# Patient Record
Sex: Female | Born: 1975 | Race: Black or African American | Hispanic: No | Smoking: Former smoker
Health system: Southern US, Community
[De-identification: ages and names within clinical notes are randomized; demographics above are authoritative.]

## PROBLEM LIST (undated history)

## (undated) DIAGNOSIS — D219 Benign neoplasm of connective and other soft tissue, unspecified: Secondary | ICD-10-CM

## (undated) DIAGNOSIS — H5440 Blindness, one eye, unspecified eye: Secondary | ICD-10-CM

## (undated) DIAGNOSIS — E559 Vitamin D deficiency, unspecified: Secondary | ICD-10-CM

## (undated) DIAGNOSIS — R87629 Unspecified abnormal cytological findings in specimens from vagina: Secondary | ICD-10-CM

## (undated) DIAGNOSIS — H5461 Unqualified visual loss, right eye, normal vision left eye: Secondary | ICD-10-CM

## (undated) DIAGNOSIS — I10 Essential (primary) hypertension: Secondary | ICD-10-CM

## (undated) HISTORY — DX: Essential (primary) hypertension: I10

## (undated) HISTORY — DX: Vitamin D deficiency, unspecified: E55.9

## (undated) HISTORY — PX: EYE SURGERY: SHX253

## (undated) HISTORY — DX: Unspecified abnormal cytological findings in specimens from vagina: R87.629

## (undated) HISTORY — PX: OTHER SURGICAL HISTORY: SHX169

---

## 2000-09-04 ENCOUNTER — Emergency Department (HOSPITAL_COMMUNITY): Admission: EM | Admit: 2000-09-04 | Discharge: 2000-09-04 | Payer: Self-pay | Admitting: Emergency Medicine

## 2001-02-11 ENCOUNTER — Emergency Department (HOSPITAL_COMMUNITY): Admission: EM | Admit: 2001-02-11 | Discharge: 2001-02-11 | Payer: Self-pay | Admitting: Emergency Medicine

## 2001-11-14 ENCOUNTER — Encounter: Payer: Self-pay | Admitting: Emergency Medicine

## 2001-11-14 ENCOUNTER — Emergency Department (HOSPITAL_COMMUNITY): Admission: EM | Admit: 2001-11-14 | Discharge: 2001-11-14 | Payer: Self-pay | Admitting: Emergency Medicine

## 2002-10-30 ENCOUNTER — Inpatient Hospital Stay (HOSPITAL_COMMUNITY): Admission: AD | Admit: 2002-10-30 | Discharge: 2002-10-30 | Payer: Self-pay | Admitting: Family Medicine

## 2002-11-21 ENCOUNTER — Encounter (INDEPENDENT_AMBULATORY_CARE_PROVIDER_SITE_OTHER): Payer: Self-pay | Admitting: *Deleted

## 2002-11-21 ENCOUNTER — Encounter: Admission: RE | Admit: 2002-11-21 | Discharge: 2002-11-21 | Payer: Self-pay | Admitting: Obstetrics and Gynecology

## 2003-02-01 ENCOUNTER — Inpatient Hospital Stay (HOSPITAL_COMMUNITY): Admission: AD | Admit: 2003-02-01 | Discharge: 2003-02-01 | Payer: Self-pay | Admitting: Obstetrics & Gynecology

## 2003-05-11 ENCOUNTER — Emergency Department (HOSPITAL_COMMUNITY): Admission: EM | Admit: 2003-05-11 | Discharge: 2003-05-11 | Payer: Self-pay | Admitting: *Deleted

## 2003-05-24 ENCOUNTER — Encounter: Admission: RE | Admit: 2003-05-24 | Discharge: 2003-05-24 | Payer: Self-pay | Admitting: Family Medicine

## 2003-12-30 ENCOUNTER — Emergency Department (HOSPITAL_COMMUNITY): Admission: EM | Admit: 2003-12-30 | Discharge: 2003-12-31 | Payer: Self-pay | Admitting: Emergency Medicine

## 2004-06-17 ENCOUNTER — Other Ambulatory Visit: Admission: RE | Admit: 2004-06-17 | Discharge: 2004-06-17 | Payer: Self-pay | Admitting: Family Medicine

## 2004-11-11 ENCOUNTER — Emergency Department (HOSPITAL_COMMUNITY): Admission: EM | Admit: 2004-11-11 | Discharge: 2004-11-11 | Payer: Self-pay | Admitting: Emergency Medicine

## 2005-02-17 ENCOUNTER — Emergency Department (HOSPITAL_COMMUNITY): Admission: EM | Admit: 2005-02-17 | Discharge: 2005-02-17 | Payer: Self-pay | Admitting: Emergency Medicine

## 2005-10-31 ENCOUNTER — Emergency Department (HOSPITAL_COMMUNITY): Admission: EM | Admit: 2005-10-31 | Discharge: 2005-10-31 | Payer: Self-pay | Admitting: Emergency Medicine

## 2005-11-25 ENCOUNTER — Ambulatory Visit: Payer: Self-pay | Admitting: Obstetrics & Gynecology

## 2005-12-30 ENCOUNTER — Ambulatory Visit: Payer: Self-pay | Admitting: Obstetrics & Gynecology

## 2006-01-27 ENCOUNTER — Ambulatory Visit: Payer: Self-pay | Admitting: Obstetrics & Gynecology

## 2006-01-27 ENCOUNTER — Other Ambulatory Visit: Admission: RE | Admit: 2006-01-27 | Discharge: 2006-01-27 | Payer: Self-pay | Admitting: Obstetrics and Gynecology

## 2006-01-27 ENCOUNTER — Encounter (INDEPENDENT_AMBULATORY_CARE_PROVIDER_SITE_OTHER): Payer: Self-pay | Admitting: Specialist

## 2006-02-12 ENCOUNTER — Ambulatory Visit: Payer: Self-pay | Admitting: Family Medicine

## 2006-07-09 ENCOUNTER — Ambulatory Visit: Payer: Self-pay | Admitting: Obstetrics and Gynecology

## 2006-07-09 ENCOUNTER — Encounter: Payer: Self-pay | Admitting: Obstetrics and Gynecology

## 2006-11-26 ENCOUNTER — Ambulatory Visit: Payer: Self-pay | Admitting: Obstetrics & Gynecology

## 2006-11-26 ENCOUNTER — Encounter: Payer: Self-pay | Admitting: Obstetrics & Gynecology

## 2007-02-11 ENCOUNTER — Inpatient Hospital Stay (HOSPITAL_COMMUNITY): Admission: AD | Admit: 2007-02-11 | Discharge: 2007-02-11 | Payer: Self-pay | Admitting: Obstetrics and Gynecology

## 2007-02-15 ENCOUNTER — Inpatient Hospital Stay (HOSPITAL_COMMUNITY): Admission: AD | Admit: 2007-02-15 | Discharge: 2007-02-15 | Payer: Self-pay | Admitting: Obstetrics & Gynecology

## 2007-02-19 ENCOUNTER — Inpatient Hospital Stay (HOSPITAL_COMMUNITY): Admission: AD | Admit: 2007-02-19 | Discharge: 2007-02-19 | Payer: Self-pay | Admitting: Obstetrics and Gynecology

## 2007-02-28 ENCOUNTER — Inpatient Hospital Stay (HOSPITAL_COMMUNITY): Admission: AD | Admit: 2007-02-28 | Discharge: 2007-03-01 | Payer: Self-pay | Admitting: Gynecology

## 2007-04-26 ENCOUNTER — Emergency Department (HOSPITAL_COMMUNITY): Admission: EM | Admit: 2007-04-26 | Discharge: 2007-04-26 | Payer: Self-pay | Admitting: Emergency Medicine

## 2007-07-01 ENCOUNTER — Encounter (INDEPENDENT_AMBULATORY_CARE_PROVIDER_SITE_OTHER): Payer: Self-pay | Admitting: Gynecology

## 2007-07-01 ENCOUNTER — Ambulatory Visit: Payer: Self-pay | Admitting: Gynecology

## 2007-09-20 ENCOUNTER — Emergency Department (HOSPITAL_COMMUNITY): Admission: EM | Admit: 2007-09-20 | Discharge: 2007-09-20 | Payer: Self-pay | Admitting: Family Medicine

## 2008-03-16 ENCOUNTER — Encounter: Payer: Self-pay | Admitting: Obstetrics & Gynecology

## 2008-03-16 ENCOUNTER — Ambulatory Visit: Payer: Self-pay | Admitting: Obstetrics & Gynecology

## 2008-03-16 LAB — CONVERTED CEMR LAB

## 2008-11-23 ENCOUNTER — Emergency Department (HOSPITAL_COMMUNITY): Admission: EM | Admit: 2008-11-23 | Discharge: 2008-11-23 | Payer: Self-pay | Admitting: Emergency Medicine

## 2008-11-25 ENCOUNTER — Inpatient Hospital Stay (HOSPITAL_COMMUNITY): Admission: AD | Admit: 2008-11-25 | Discharge: 2008-11-25 | Payer: Self-pay | Admitting: Obstetrics & Gynecology

## 2008-11-28 ENCOUNTER — Ambulatory Visit (HOSPITAL_COMMUNITY): Admission: RE | Admit: 2008-11-28 | Discharge: 2008-11-28 | Payer: Self-pay | Admitting: Obstetrics & Gynecology

## 2008-11-30 ENCOUNTER — Ambulatory Visit: Payer: Self-pay | Admitting: Obstetrics & Gynecology

## 2008-12-07 ENCOUNTER — Ambulatory Visit: Payer: Self-pay | Admitting: Obstetrics and Gynecology

## 2008-12-07 ENCOUNTER — Ambulatory Visit (HOSPITAL_COMMUNITY): Admission: RE | Admit: 2008-12-07 | Discharge: 2008-12-07 | Payer: Self-pay | Admitting: Obstetrics & Gynecology

## 2008-12-14 ENCOUNTER — Inpatient Hospital Stay (HOSPITAL_COMMUNITY): Admission: AD | Admit: 2008-12-14 | Discharge: 2008-12-15 | Payer: Self-pay | Admitting: Obstetrics and Gynecology

## 2009-01-02 ENCOUNTER — Encounter: Payer: Self-pay | Admitting: Obstetrics & Gynecology

## 2009-01-02 ENCOUNTER — Ambulatory Visit: Payer: Self-pay | Admitting: Obstetrics and Gynecology

## 2009-01-02 LAB — CONVERTED CEMR LAB
Antibody Screen: NEGATIVE
Basophils Absolute: 0 10*3/uL (ref 0.0–0.1)
Basophils Relative: 0 % (ref 0–1)
Eosinophils Absolute: 0.1 10*3/uL (ref 0.0–0.7)
HCT: 32.5 % — ABNORMAL LOW (ref 36.0–46.0)
Hemoglobin: 11.7 g/dL — ABNORMAL LOW (ref 12.0–15.0)
Hgb A2 Quant: 2.4 % (ref 2.2–3.2)
Hgb A: 96.2 % — ABNORMAL LOW (ref 96.8–97.8)
Hgb F Quant: 1.4 % (ref 0.0–2.0)
Hgb S Quant: 0 % (ref 0.0–0.0)
Lymphocytes Relative: 30 % (ref 12–46)
MCHC: 36 g/dL (ref 30.0–36.0)
Neutro Abs: 3.7 10*3/uL (ref 1.7–7.7)
RBC: 3.77 M/uL — ABNORMAL LOW (ref 3.87–5.11)

## 2009-01-10 ENCOUNTER — Inpatient Hospital Stay (HOSPITAL_COMMUNITY): Admission: AD | Admit: 2009-01-10 | Discharge: 2009-01-10 | Payer: Self-pay | Admitting: Obstetrics & Gynecology

## 2009-01-14 ENCOUNTER — Ambulatory Visit (HOSPITAL_COMMUNITY): Admission: RE | Admit: 2009-01-14 | Discharge: 2009-01-14 | Payer: Self-pay | Admitting: Family Medicine

## 2009-01-16 ENCOUNTER — Ambulatory Visit: Payer: Self-pay | Admitting: Obstetrics & Gynecology

## 2009-01-30 ENCOUNTER — Encounter: Payer: Self-pay | Admitting: Obstetrics & Gynecology

## 2009-01-30 ENCOUNTER — Ambulatory Visit: Payer: Self-pay | Admitting: Obstetrics and Gynecology

## 2009-01-30 LAB — CONVERTED CEMR LAB
MCHC: 36.4 g/dL — ABNORMAL HIGH (ref 30.0–36.0)
Platelets: 221 10*3/uL (ref 150–400)
RDW: 13.1 % (ref 11.5–15.5)

## 2009-02-06 ENCOUNTER — Ambulatory Visit: Payer: Self-pay | Admitting: Obstetrics and Gynecology

## 2009-02-14 ENCOUNTER — Ambulatory Visit (HOSPITAL_COMMUNITY): Admission: RE | Admit: 2009-02-14 | Discharge: 2009-02-14 | Payer: Self-pay | Admitting: Family Medicine

## 2009-02-14 ENCOUNTER — Ambulatory Visit: Payer: Self-pay | Admitting: Obstetrics & Gynecology

## 2009-02-14 LAB — CONVERTED CEMR LAB
Trich, Wet Prep: NONE SEEN
Yeast Wet Prep HPF POC: NONE SEEN

## 2009-02-25 ENCOUNTER — Ambulatory Visit (HOSPITAL_COMMUNITY): Admission: RE | Admit: 2009-02-25 | Discharge: 2009-02-25 | Payer: Self-pay | Admitting: Family Medicine

## 2009-02-28 ENCOUNTER — Ambulatory Visit: Payer: Self-pay | Admitting: Family Medicine

## 2009-03-04 ENCOUNTER — Ambulatory Visit: Payer: Self-pay | Admitting: Obstetrics & Gynecology

## 2009-03-04 LAB — CONVERTED CEMR LAB
ALT: 10 units/L (ref 0–35)
Alkaline Phosphatase: 38 units/L — ABNORMAL LOW (ref 39–117)
CO2: 22 meq/L (ref 19–32)
Collection Interval-CRCL: 24 hr
Creatinine Clearance: 211 mL/min — ABNORMAL HIGH (ref 75–115)
Creatinine, Ser: 0.47 mg/dL (ref 0.40–1.20)
Creatinine, Urine: 62 mg/dL
Glucose, Bld: 110 mg/dL — ABNORMAL HIGH (ref 70–99)
HCT: 30.2 % — ABNORMAL LOW (ref 36.0–46.0)
MCHC: 33.8 g/dL (ref 30.0–36.0)
MCV: 92.1 fL (ref 78.0–100.0)
Platelets: 226 10*3/uL (ref 150–400)
Protein, Ur: 44 mg/24hr — ABNORMAL LOW (ref 50–100)
Sodium: 137 meq/L (ref 135–145)
Total Bilirubin: 0.3 mg/dL (ref 0.3–1.2)
Total Protein: 6.3 g/dL (ref 6.0–8.3)

## 2009-03-14 ENCOUNTER — Ambulatory Visit: Payer: Self-pay | Admitting: Obstetrics and Gynecology

## 2009-03-25 ENCOUNTER — Ambulatory Visit (HOSPITAL_COMMUNITY): Admission: RE | Admit: 2009-03-25 | Discharge: 2009-03-25 | Payer: Self-pay | Admitting: Family Medicine

## 2009-04-04 ENCOUNTER — Ambulatory Visit: Payer: Self-pay | Admitting: Obstetrics & Gynecology

## 2009-04-04 LAB — CONVERTED CEMR LAB: Yeast Wet Prep HPF POC: NONE SEEN

## 2009-04-18 ENCOUNTER — Ambulatory Visit: Payer: Self-pay | Admitting: Family Medicine

## 2009-04-22 ENCOUNTER — Ambulatory Visit (HOSPITAL_COMMUNITY): Admission: RE | Admit: 2009-04-22 | Discharge: 2009-04-22 | Payer: Self-pay | Admitting: Family Medicine

## 2009-05-02 ENCOUNTER — Encounter: Payer: Self-pay | Admitting: Obstetrics & Gynecology

## 2009-05-02 ENCOUNTER — Ambulatory Visit: Payer: Self-pay | Admitting: Obstetrics & Gynecology

## 2009-05-02 LAB — CONVERTED CEMR LAB
HCT: 32.5 % — ABNORMAL LOW (ref 36.0–46.0)
Hemoglobin: 10.5 g/dL — ABNORMAL LOW (ref 12.0–15.0)
MCHC: 32.3 g/dL (ref 30.0–36.0)
Platelets: 222 10*3/uL (ref 150–400)
RDW: 13.2 % (ref 11.5–15.5)

## 2009-05-03 ENCOUNTER — Encounter: Payer: Self-pay | Admitting: Obstetrics & Gynecology

## 2009-05-03 LAB — CONVERTED CEMR LAB

## 2009-05-09 ENCOUNTER — Ambulatory Visit: Payer: Self-pay | Admitting: Obstetrics & Gynecology

## 2009-05-09 ENCOUNTER — Encounter: Payer: Self-pay | Admitting: Obstetrics & Gynecology

## 2009-05-16 ENCOUNTER — Ambulatory Visit: Payer: Self-pay | Admitting: Obstetrics and Gynecology

## 2009-05-20 ENCOUNTER — Ambulatory Visit (HOSPITAL_COMMUNITY): Admission: RE | Admit: 2009-05-20 | Discharge: 2009-05-20 | Payer: Self-pay | Admitting: Family Medicine

## 2009-05-30 ENCOUNTER — Ambulatory Visit: Payer: Self-pay | Admitting: Obstetrics and Gynecology

## 2009-06-10 ENCOUNTER — Ambulatory Visit: Payer: Self-pay | Admitting: Obstetrics and Gynecology

## 2009-06-10 ENCOUNTER — Inpatient Hospital Stay (HOSPITAL_COMMUNITY): Admission: AD | Admit: 2009-06-10 | Discharge: 2009-06-10 | Payer: Self-pay | Admitting: Obstetrics & Gynecology

## 2009-06-13 ENCOUNTER — Ambulatory Visit: Payer: Self-pay | Admitting: Obstetrics & Gynecology

## 2009-06-17 ENCOUNTER — Ambulatory Visit (HOSPITAL_COMMUNITY): Admission: RE | Admit: 2009-06-17 | Discharge: 2009-06-17 | Payer: Self-pay | Admitting: Obstetrics and Gynecology

## 2009-06-20 ENCOUNTER — Ambulatory Visit: Payer: Self-pay | Admitting: Family Medicine

## 2009-06-27 ENCOUNTER — Ambulatory Visit: Payer: Self-pay | Admitting: Obstetrics & Gynecology

## 2009-06-27 LAB — CONVERTED CEMR LAB
Chlamydia, DNA Probe: NEGATIVE
GC Probe Amp, Genital: NEGATIVE

## 2009-06-28 ENCOUNTER — Encounter: Payer: Self-pay | Admitting: Obstetrics & Gynecology

## 2009-07-01 ENCOUNTER — Ambulatory Visit: Payer: Self-pay | Admitting: Obstetrics & Gynecology

## 2009-07-02 ENCOUNTER — Inpatient Hospital Stay (HOSPITAL_COMMUNITY): Admission: AD | Admit: 2009-07-02 | Discharge: 2009-07-04 | Payer: Self-pay | Admitting: Family Medicine

## 2009-07-02 ENCOUNTER — Encounter: Payer: Self-pay | Admitting: Family Medicine

## 2009-07-02 ENCOUNTER — Ambulatory Visit: Payer: Self-pay | Admitting: Obstetrics and Gynecology

## 2009-09-13 ENCOUNTER — Ambulatory Visit: Payer: Self-pay | Admitting: Obstetrics & Gynecology

## 2009-09-13 LAB — CONVERTED CEMR LAB: Pap Smear: NEGATIVE

## 2010-02-15 ENCOUNTER — Encounter: Payer: Self-pay | Admitting: *Deleted

## 2010-02-16 ENCOUNTER — Encounter: Payer: Self-pay | Admitting: *Deleted

## 2010-02-16 ENCOUNTER — Encounter: Payer: Self-pay | Admitting: Obstetrics & Gynecology

## 2010-03-16 ENCOUNTER — Inpatient Hospital Stay (INDEPENDENT_AMBULATORY_CARE_PROVIDER_SITE_OTHER)
Admission: RE | Admit: 2010-03-16 | Discharge: 2010-03-16 | Disposition: A | Discharge status: Home / Self Care | Payer: Medicare Other | Source: Ambulatory Visit | Attending: Emergency Medicine | Admitting: Emergency Medicine

## 2010-03-16 DIAGNOSIS — K047 Periapical abscess without sinus: Secondary | ICD-10-CM

## 2010-04-13 LAB — POCT URINALYSIS DIP (DEVICE)
Bilirubin Urine: NEGATIVE
Glucose, UA: NEGATIVE mg/dL
Glucose, UA: NEGATIVE mg/dL
Glucose, UA: NEGATIVE mg/dL
Hgb urine dipstick: NEGATIVE
Ketones, ur: NEGATIVE mg/dL
Nitrite: NEGATIVE
Nitrite: NEGATIVE
Protein, ur: NEGATIVE mg/dL
Urobilinogen, UA: 0.2 mg/dL (ref 0.0–1.0)
pH: 6 (ref 5.0–8.0)

## 2010-04-14 LAB — URINALYSIS, ROUTINE W REFLEX MICROSCOPIC
Bilirubin Urine: NEGATIVE
Nitrite: NEGATIVE
Specific Gravity, Urine: <1.005 — ABNORMAL LOW (ref 1.005–1.030)
pH: 6.5 (ref 5.0–8.0)

## 2010-04-14 LAB — POCT URINALYSIS DIP (DEVICE)
Bilirubin Urine: NEGATIVE
Bilirubin Urine: NEGATIVE
Bilirubin Urine: NEGATIVE
Glucose, UA: NEGATIVE mg/dL
Glucose, UA: NEGATIVE mg/dL
Ketones, ur: NEGATIVE mg/dL
Ketones, ur: NEGATIVE mg/dL
Nitrite: NEGATIVE
Specific Gravity, Urine: 1.02 (ref 1.005–1.030)
Specific Gravity, Urine: 1.025 (ref 1.005–1.030)
Urobilinogen, UA: 0.2 mg/dL (ref 0.0–1.0)
pH: 5.5 (ref 5.0–8.0)

## 2010-04-14 LAB — CBC
HCT: 30.2 % — ABNORMAL LOW (ref 36.0–46.0)
Hemoglobin: 10.6 g/dL — ABNORMAL LOW (ref 12.0–15.0)
MCV: 92.7 fL (ref 78.0–100.0)
Platelets: 167 10*3/uL (ref 150–400)
WBC: 10.2 10*3/uL (ref 4.0–10.5)
WBC: 11.1 10*3/uL — ABNORMAL HIGH (ref 4.0–10.5)

## 2010-04-14 LAB — COMPREHENSIVE METABOLIC PANEL
Alkaline Phosphatase: 101 U/L (ref 39–117)
BUN: 3 mg/dL — ABNORMAL LOW (ref 6–23)
CO2: 22 mEq/L (ref 19–32)
Chloride: 108 mEq/L (ref 96–112)
GFR calc non Af Amer: >60 mL/min (ref >60)
Glucose, Bld: 83 mg/dL (ref 70–99)
Potassium: 3.9 mEq/L (ref 3.5–5.1)
Total Bilirubin: 0.4 mg/dL (ref 0.3–1.2)

## 2010-04-14 LAB — CROSSMATCH
ABO/RH(D): B POS
Antibody Screen: NEGATIVE

## 2010-04-14 LAB — RPR: RPR Ser Ql: NONREACTIVE

## 2010-04-14 LAB — DIC (DISSEMINATED INTRAVASCULAR COAGULATION)PANEL: Smear Review: NONE SEEN

## 2010-04-14 LAB — RAPID URINE DRUG SCREEN, HOSP PERFORMED
Opiates: NOT DETECTED
Tetrahydrocannabinol: NOT DETECTED

## 2010-04-14 LAB — GC/CHLAMYDIA PROBE AMP, GENITAL: GC Probe Amp, Genital: NEGATIVE

## 2010-04-15 LAB — POCT URINALYSIS DIP (DEVICE)
Bilirubin Urine: NEGATIVE
Bilirubin Urine: NEGATIVE
Glucose, UA: NEGATIVE mg/dL
Hgb urine dipstick: NEGATIVE
Hgb urine dipstick: NEGATIVE
Ketones, ur: NEGATIVE mg/dL
Ketones, ur: NEGATIVE mg/dL
Specific Gravity, Urine: 1.025 (ref 1.005–1.030)
pH: 5.5 (ref 5.0–8.0)

## 2010-04-16 LAB — POCT URINALYSIS DIP (DEVICE)
Bilirubin Urine: NEGATIVE
Bilirubin Urine: NEGATIVE
Glucose, UA: NEGATIVE mg/dL
Glucose, UA: NEGATIVE mg/dL
Glucose, UA: NEGATIVE mg/dL
Hgb urine dipstick: NEGATIVE
Hgb urine dipstick: NEGATIVE
Hgb urine dipstick: NEGATIVE
Ketones, ur: NEGATIVE mg/dL
Ketones, ur: NEGATIVE mg/dL
Ketones, ur: NEGATIVE mg/dL
Ketones, ur: NEGATIVE mg/dL
Nitrite: NEGATIVE
Protein, ur: 30 mg/dL — AB
Specific Gravity, Urine: 1.02 (ref 1.005–1.030)
Specific Gravity, Urine: 1.025 (ref 1.005–1.030)
Specific Gravity, Urine: >=1.03 (ref 1.005–1.030)
Urobilinogen, UA: 0.2 mg/dL (ref 0.0–1.0)
pH: 5.5 (ref 5.0–8.0)
pH: 5.5 (ref 5.0–8.0)
pH: 6.5 (ref 5.0–8.0)

## 2010-04-20 LAB — POCT URINALYSIS DIP (DEVICE)
Bilirubin Urine: NEGATIVE
Hgb urine dipstick: NEGATIVE
Hgb urine dipstick: NEGATIVE
Ketones, ur: NEGATIVE mg/dL
Protein, ur: NEGATIVE mg/dL
Protein, ur: NEGATIVE mg/dL
Specific Gravity, Urine: 1.025 (ref 1.005–1.030)
Specific Gravity, Urine: >=1.03 (ref 1.005–1.030)
Urobilinogen, UA: 0.2 mg/dL (ref 0.0–1.0)
pH: 5 (ref 5.0–8.0)
pH: 5.5 (ref 5.0–8.0)

## 2010-04-28 LAB — URINALYSIS, ROUTINE W REFLEX MICROSCOPIC
Glucose, UA: NEGATIVE mg/dL
Specific Gravity, Urine: >1.03 — ABNORMAL HIGH (ref 1.005–1.030)
pH: 5.5 (ref 5.0–8.0)

## 2010-04-28 LAB — WET PREP, GENITAL: Yeast Wet Prep HPF POC: NONE SEEN

## 2010-04-28 LAB — DIFFERENTIAL
Basophils Relative: 0 % (ref 0–1)
Lymphs Abs: 1.8 10*3/uL (ref 0.7–4.0)
Monocytes Relative: 6 % (ref 3–12)
Neutro Abs: 4.1 10*3/uL (ref 1.7–7.7)
Neutrophils Relative %: 65 % (ref 43–77)

## 2010-04-28 LAB — CBC
MCHC: 34.2 g/dL (ref 30.0–36.0)
RBC: 3.55 MIL/uL — ABNORMAL LOW (ref 3.87–5.11)
WBC: 6.3 10*3/uL (ref 4.0–10.5)

## 2010-04-28 LAB — POCT URINALYSIS DIP (DEVICE)
Protein, ur: NEGATIVE mg/dL
Specific Gravity, Urine: >=1.03 (ref 1.005–1.030)
Urobilinogen, UA: 0.2 mg/dL (ref 0.0–1.0)
pH: 5 (ref 5.0–8.0)

## 2010-04-28 LAB — GC/CHLAMYDIA PROBE AMP, GENITAL
Chlamydia, DNA Probe: NEGATIVE
GC Probe Amp, Genital: NEGATIVE

## 2010-04-28 LAB — URINE MICROSCOPIC-ADD ON

## 2010-04-29 LAB — POCT URINALYSIS DIP (DEVICE)
Glucose, UA: NEGATIVE mg/dL
Hgb urine dipstick: NEGATIVE
Nitrite: NEGATIVE
Protein, ur: NEGATIVE mg/dL
Urobilinogen, UA: 0.2 mg/dL (ref 0.0–1.0)
pH: 5.5 (ref 5.0–8.0)

## 2010-04-30 LAB — URINALYSIS, ROUTINE W REFLEX MICROSCOPIC
Bilirubin Urine: NEGATIVE
Glucose, UA: NEGATIVE mg/dL
Hgb urine dipstick: NEGATIVE
Ketones, ur: 15 mg/dL — AB
Protein, ur: NEGATIVE mg/dL
pH: 5.5 (ref 5.0–8.0)

## 2010-05-01 LAB — URINE MICROSCOPIC-ADD ON

## 2010-05-01 LAB — ABO/RH: ABO/RH(D): B POS

## 2010-05-01 LAB — URINALYSIS, ROUTINE W REFLEX MICROSCOPIC
Bilirubin Urine: NEGATIVE
Glucose, UA: NEGATIVE mg/dL
Specific Gravity, Urine: 1.006 (ref 1.005–1.030)
pH: 6 (ref 5.0–8.0)

## 2010-05-01 LAB — POCT PREGNANCY, URINE: Preg Test, Ur: POSITIVE

## 2010-05-01 LAB — WET PREP, GENITAL: Yeast Wet Prep HPF POC: NONE SEEN

## 2010-05-01 LAB — HCG, QUANTITATIVE, PREGNANCY: hCG, Beta Chain, Quant, S: 10605 m[IU]/mL — ABNORMAL HIGH (ref <5)

## 2010-05-23 ENCOUNTER — Ambulatory Visit: Payer: Medicare Other | Admitting: Obstetrics and Gynecology

## 2010-06-10 NOTE — Group Therapy Note (Signed)
[name], [name] NO.:  [id number]   MEDICAL RECORD NO.:  [id number]          PATIENT TYPE:  WOC   LOCATION:  [facility]                   FACILITY:  [facility]   PHYSICIAN:  Ginger Carne, MD DATE OF BIRTH:  Feb 13, 1975   DATE OF SERVICE:                                  CLINIC NOTE   The patient returns today for a 78-month Pap smear.  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  She had a normal Pap smear in October 2008.  A Pap  smear was performed today and if this is normal then she will to return  to yearly Pap smear examinations.           ______________________________  Ginger Carne, MD     SHB/MEDQ  D:  07/01/2007  T:  07/02/2007  Job:  [id number]

## 2010-06-10 NOTE — Group Therapy Note (Signed)
NAMETIAHNA, CURE NO.:  [id number]   MEDICAL RECORD NO.:  [id number]          PATIENT TYPE:  WOC   LOCATION:  [facility]                   FACILITY:  [facility]   PHYSICIAN:  Elsie Lincoln, MD      DATE OF BIRTH:  [birth year]   DATE OF SERVICE:  11/26/2006                                  CLINIC NOTE   Patient is a 35 year old female who presents for follow-up Pap smear.  Patient had a CIN II LEEP.  Had normal Pap smears approximately four  months ago.  She needs a Pap smear today.  Interestingly enough, the  patient has had menometrorrhagia for approximately a year.  She is  supposed to keep a menstrual calendar and has not.  She did experience  irregular menses as recently as this month.   I believe she need an endometrial biopsy at this point.  She has had her  TSH checked, and it is normal.  She does not want an endometrial biopsy  today, and it would probably be best if we pre-medicated her.   We will have her come back in the next 2-3 weeks for an endometrial  biopsy and take 600-800 mg of Motrin.   A Pap smear was done, and endometrial biopsy is scheduled.           ______________________________  Elsie Lincoln, MD     KL/MEDQ  D:  11/26/2006  T:  11/26/2006  Job:  [id number]

## 2010-06-10 NOTE — Group Therapy Note (Signed)
[name], [name]                ACCOUNT NO.:  [id number]   MEDICAL RECORD NO.:  [id number]          PATIENT TYPE:  WOC   [facility]ATION[facility]                   [facility]ACILITY:  [facility]   PHYSICIAN:  Johnella Moloney, MD        DATE OF BIRTH:  Nov 23, 1975   DATE OF SERVICE:                                  CLINIC NOTE   CHIEF COMPLAINT:  Annual exam.   HISTORY OF PRESENT ILLNESS:  This is a 35 year old, G3, P0-2-1-2, with a  last menstrual period February 26, 2008.  She presents to clinic today  for her annual exam and has no gynecologic complaints.   PAST OB/GYN HISTORY:  She has had 2 spontaneous vaginal deliveries and  one therapeutic abortion.  Her last Pap smear was in June 2009.  She  does have a history of abnormal Pap smears; however, her last 3 Pap  smears have been negative, and she is on a yearly schedule now.  Menarche was at age 34.  Her menstrual cycles are regular 28 days  between cycles, they last 5 days and are medium flow.  She admits to  some pain with periods.  There is no bleeding between periods.  She is  currently using condoms for birth control.   Past medical history is significant for asthma, hypertension, and  depression.   Her current medications include:  1. Fluoxetine 20 mg by mouth daily.  2. Diovan 160 mg daily.  3. Trazodone 100 mg daily.  4. Eye drops for allergies.   ALLERGIES:  PENICILLIN causes hives.   SOCIAL HISTORY:  Ms. [name] is no longer working due to her disability.  She is legally blind in her left eye.  She currently smokes half a pack  of cigarettes a day.  She is actually trying to quit using a quit line.  She denies any alcohol or illicit drug use.  She is currently sexually  active with one female partner, this is not a new partner.   SURGICAL HISTORY:  She had eye surgery and laser surgery.   FAMILY HISTORY:  Father, paternal and maternal aunts and uncles, and  paternal grandmother have diabetes.  Her father has had a heart attack  and  has heart disease.  Her father, grandmother, and mother have high  blood pressure.  There is no GYN cancer.  No colon cancer.  Her father  also has a DVT.   REVIEW OF SYSTEMS:  Negative.   PHYSICAL EXAMINATION:  VITAL SIGNS:  Temperature 98.1, pulse 78, blood  pressure 134/93, her weight is 128.3 pounds, and she is 5 feet 3 inches.  Respirations 16.  GENERAL:  This is a pleasant 35 year old female in no apparent distress.  HEENT:  Head atraumatic, normocephalic.  Extraocular movements intact.  NECK:  Supple with no lymphadenopathy.  Thyroid not enlarged.  LUNGS:  Clear to auscultation bilaterally.  ABDOMEN:  Soft, nontender, nondistended.  BREASTS:  Normal in size and symmetric.  They are smooth.  No masses.  No lymphadenopathy.  No discharge.  PELVIC:  Normal external genitalia.  Vagina and cervix normal.  No  lesions.  Pap smear done.  EXTREMITIES:  No cyanosis, no clubbing, or edema.   ASSESSMENT AND PLAN:  Ms. [name] is a 35 year old woman presenting  to clinic today for her annual exam with no complaints.  A Pap smear was  done.  She will be called with those results.  She also requested STD  testing.  Gonorrhea and chlamydia tested with the Pap.  Serum testing  was also done today for hepatitis B, hepatitis C, HIV, and syphilis.  As  the patient is a smoker, I counseled her on quitting options and  commended her for her efforts and encouraged her to continue trying to  quit.  The patient will return to clinic in 1 year or sooner if any  results come back abnormal.           ______________________________  Johnella Moloney, MD     UD/MEDQ  D:  03/16/2008  T:  03/16/2008  Job:  [id number]

## 2010-06-13 ENCOUNTER — Ambulatory Visit: Payer: Medicare Other | Admitting: Physician Assistant

## 2010-06-13 ENCOUNTER — Emergency Department (HOSPITAL_COMMUNITY): Payer: Medicare Other

## 2010-06-13 ENCOUNTER — Emergency Department (HOSPITAL_COMMUNITY)
Admission: EM | Admit: 2010-06-13 | Discharge: 2010-06-13 | Disposition: A | Discharge status: Home / Self Care | Payer: Medicare Other | Attending: Emergency Medicine | Admitting: Emergency Medicine

## 2010-06-13 ENCOUNTER — Other Ambulatory Visit: Payer: Self-pay | Admitting: Physician Assistant

## 2010-06-13 DIAGNOSIS — Z0142 Encounter for cervical smear to confirm findings of recent normal smear following initial abnormal smear: Secondary | ICD-10-CM

## 2010-06-13 DIAGNOSIS — M549 Dorsalgia, unspecified: Secondary | ICD-10-CM | POA: Insufficient documentation

## 2010-06-13 DIAGNOSIS — I10 Essential (primary) hypertension: Secondary | ICD-10-CM | POA: Insufficient documentation

## 2010-06-13 LAB — CBC
HCT: 36.8 % (ref 36.0–46.0)
Hemoglobin: 12.8 g/dL (ref 12.0–15.0)
MCHC: 34.8 g/dL (ref 30.0–36.0)
RDW: 12.8 % (ref 11.5–15.5)
WBC: 12.9 10*3/uL — ABNORMAL HIGH (ref 4.0–10.5)

## 2010-06-13 LAB — BASIC METABOLIC PANEL
Calcium: 9.8 mg/dL (ref 8.4–10.5)
GFR calc Af Amer: >60 mL/min (ref >60)
GFR calc non Af Amer: >60 mL/min (ref >60)
Glucose, Bld: 99 mg/dL (ref 70–99)
Potassium: 3.8 mEq/L (ref 3.5–5.1)
Sodium: 138 mEq/L (ref 135–145)

## 2010-06-13 NOTE — Group Therapy Note (Signed)
[name], [name]                ACCOUNT NO.:  [id number]  MEDICAL RECORD NO.:  [id number]           PATIENT TYPE:  A  LOCATION:  [facility]                   FACILITY:  [facility]  PHYSICIAN:  Maylon Cos, CNM    DATE OF BIRTH:  03/02/1975  DATE OF SERVICE:  06/13/2010                                 CLINIC NOTE  REASON FOR TODAY'S VISIT:  Six months repeat Pap.  HISTORY OF PRESENT ILLNESS:  The patient is a 35 year old chronic hypertensive who comes in for repeat Pap smear.  She has a history of HGSIL in 6 months since her last Pap smear.  She is also 11 months postpartum.  She continues to be on her Diovan 80/12.5 dosage and her blood pressures are well-controlled.  She does have complaints of left lower back pain that started last night.  She is uncertain.  She denies trauma but she is lifting a 67-month-old baby that weighs 20 pounds.  PHYSICAL EXAMINATION:  GENERAL:  Today, [name] is a pleasant African American female who appears to be her stated age of 1. HEENT:  Grossly normal.  She is in no apparent distress but does appear to be uncomfortable with movement. VITAL SIGNS:  Stable.  Her pulse is 97, her blood pressure is 130/85, her weight is 151.9, and height is 63 inches. ABDOMEN:  Soft and nontender. GENITALIA:  External genitalia without lesions.  Mucous membranes are pink without lesions.  She has a scant amount of creamy discharge that is nonodorous.  Cervix is parous and slightly friable.  Bimanual exam reveals a nontender and nonenlarged uterus.  No cervical motion tenderness.  Adnexa are not enlarged and nontender. EXTREMITIES:  Reactive.  Lower extremities are without edema.  She does have a positive straight leg test on her left side.  She has sacroiliac tenderness on her left with some paraspinal tenderness as well and soft muscle tenderness.  ASSESSMENT: 1. Repeat Pap smear. 2. Sciatica. 3. Muscle spasms.  PLAN: 1. Pap smear obtained and sent per  routine to pathology. 2. Comfort measures reviewed with the patient for sciatica as well as     muscle spasm and muscle tenderness to include 600 mg of Motrin q.6     h. p.o. x72 hours.  Heat and rest.  If pain is continued past 72     hours, the patient is encouraged to see primary care provider or     Urgent Care.  Signs and symptoms of complication have been reviewed     with the patient and reasons to see additional medical care sooner     have also been reviewed.  The patient verbalizes understanding.  FOLLOWUP:  The patient should follow up in 12 months for annual exam and Pap smear.  Given that this Pap smear is normal, she can return to a q.12 month annual exam and Pap smear regime.  She has been informed that a letter will be sent with normal Pap smear in 7-10 days, abnormal Pap smear results will be called by nursing staff.          ______________________________ Maylon Cos, CNM    SS/MEDQ  D:  06/13/2010  T:  06/13/2010  Job:  [id number]

## 2010-06-13 NOTE — Group Therapy Note (Signed)
NAMEJAYDALEE, BARDWELL NO.:  [id number]   MEDICAL RECORD NO.:  [id number]          PATIENT TYPE:  WOC   LOCATION[facility]                   [facility]ACILITY[facility]   PHYSICIAN:  Dorthula Perfect, MD     DATE OF BIRTH:  August 13, 1975   DATE OF SERVICE:                                    CLINIC NOTE   HISTORY OF PRESENT ILLNESS:  A 35 year old black female, gravida 3, para 2,  AB1, last menstrual period November 18, 2005, who is here for a LEEP  evaluation.  [facility] of this  year that was read as a low-grade lesion encompassing mild dysplasia and  HPV.  [facility] with biopsies.  The  cervical biopsy showed a high-grade lesion, CIN2, with koilocytic atypia  present.  The endocervix showed LSIL, CIN1, with koilocytic atypia.  She is  here for LEEP evaluation.   PAST MEDICAL HISTORY:  Significant.  In 2004, at [facility], she had laser of my cervix.  This was done in the operating room  under anesthesia, by her history.  She said that she had cells burned off.  We do not have those records at this time.  Her subsequent Pap smears the  next 2 years were normal.   She raises questions about having a hysterectomy performed so that she does  not have to keep on having these abnormal Pap smears and this problem.   GYN exam is not performed today.  She has a broken right ankle and is not  able to put her foot and leg up in the stirrups at this time. Her cast comes  off the first part of December.  She saw the LEEP movie.  I discussed this  problem with Dr. Okey Dupre, who feels that she needs a conization because of a  CIN2 diagnosis, and the endocervical diagnosis.  She needs to be sure she  does not have an invasive lesion.  I discussed this with the patient.  She  will return the first part of December when she is able to have a pelvic  exam.  In the meantime, she has signed a release,  which will be forwarded to  [facility] to obtain both history and physical, operative note and  pathology of her last cervical procedure.           ______________________________  Dorthula Perfect, MD     ER/MEDQ  D:  11/25/2005  T:  11/26/2005  Job:  [id number]

## 2010-06-13 NOTE — Group Therapy Note (Signed)
NAME:  [name], [name]                          ACCOUNT NO.:  [id number]   MEDICAL RECORD NO.:  [id number]                   PATIENT TYPE:  OUT   LOCATION[facility]                           FACILITY:  [facility]   PHYSICIAN:  Elsie Lincoln, MD                   DATE OF BIRTH:  02/04/1975   DATE OF SERVICE:  11/21/2002                                    CLINIC NOTE   CHIEF COMPLAINT:  This is a 35 year old G3 para 2-0-1-2 with last menstrual  period October 25, 2002 who comes for follow-up.  The patient has several  complaints.  First, the patient wanted to know if she was pregnant.  Urine  pregnancy test was done which was negative.  The patient then complains of  abnormal bleeding since a laser treatment of the cervix at [facility] was done in April 2004 for abnormal Pap smear.  The  patient states that periods have been sometimes up to twice a month, heavy,  with clots.  The patient denies any other menstrual irregularities prior to  the laser treatment.   PAST MEDICAL HISTORY:  Denies.   PAST SURGICAL HISTORY:  Denies.   GYNECOLOGICAL HISTORY:  Two spontaneous vaginal deliveries and one  spontaneous miscarriage.  Denies fibroids, cysts, sexually transmitted  disease, or breast biopsies.   REVIEW OF SYMPTOMS:  No nausea, vomiting, chest pain, shortness of breath,  change in bladder habits.   PHYSICAL EXAMINATION:  VITAL SIGNS:  Blood pressure 152/99, pulse 79, weight  122.7, height 5 feet 3 inches.  BREASTS:  Nontender, no masses.  There is an approximately 1.5 x 1.5 cm dark  nevi on the left areola at 6 o'clock.  There are irregular borders, not  raised.  The patient states this has been here all her life.  ABDOMEN:  Soft, nontender, nondistended.  PELVIC:  External genitalia:  Small nevus on left inner thigh less than 0.5  cm diameter.  Vagina:  Pink, healthy rugae.  Cervix:  Closed, nontender.  Uterus:  Slightly enlarged, retroverted, and deviated to  the left.  Adnexa:  No masses, nontender.  Rectovaginal:  Negative.   ASSESSMENT AND PLAN:  A 35 year old para 2-0-1-2.   1. Irregular menses.  UCG negative.  Will check beta hCG, FSH, TSH,     prolactin, and CBC today.  If all these are normal will consider     endometrial biopsy at next visit.  2. Questionable enlarged, tender uterus.  Will get transvaginal ultrasound     to evaluate for fibroids and endometrial stripe.  3. Referral to family medicine doctor to evaluate hypertension.  The patient     was also told she was hypertensive at [facility] several days ago.  4. Referral to dermatologist to evaluate nevi on left areola and left inner     thigh.  5.     Return to clinic in three weeks  for results.  6. Pap smear and cultures done today as well.                                               Elsie Lincoln, MD    KL/MEDQ  D:  11/21/2002  T:  11/21/2002  Job:  [id number]

## 2010-06-13 NOTE — Group Therapy Note (Signed)
NAMEDARLEN, GLEDHILL NO.:  [id number]   MEDICAL RECORD NO.:  [id number]          PATIENT TYPE:  WOC   [facility]ATION[facility]                   [facility]ACILITY[facility]   PHYSICIAN:  Elsie Lincoln, MD      DATE OF BIRTH:  [birth year]   DATE OF SERVICE:  12/30/2005                                  CLINIC NOTE   The patient is a 35 year old female who presents with recurrent cervical  dysplasia.  She has had a laser LEEP in the past and now she has high-  grade CIN-2 with ECC of CIN-1.  Her colposcopy was adequate.  I think  that the patient would be a good candidate for cervical conization with  our __________ LEEP because we get a slightly deep LEEP than we do with  the old fashioned circular LEEP.  The patient was counseled for this.  She has already watched the procedure.  She wants to begin as soon as  possible.  So we will have her in possibly within two to four weeks.  If  she continues to have cervical dysplasia, she would like a hysterectomy  and I think that is an option.  The patient was offered an HIV test  which she accepted and also the patient counseled to quit smoking.           ______________________________  Elsie Lincoln, MD     KL/MEDQ  D:  12/30/2005  T:  12/31/2005  Job:  [id number]

## 2010-06-13 NOTE — Group Therapy Note (Signed)
[name], [name]                ACCOUNT NO.:  [id number]   MEDICAL RECORD NO.:  [id number]          PATIENT TYPE:  WOC   [facility]ATION[facility]                   [facility]   PHYSICIAN:  Tinnie Gens, MD        DATE OF BIRTH:  February 10, 1975   DATE OF SERVICE:                                  CLINIC NOTE   CHIEF COMPLAINT:  Followup LEEP.   HISTORY OF PRESENT ILLNESS:  The patient is a 35 year old gravida, 3  para 2 who had recurrent CIN 2 and is status post __________ LEEP on  January 26, 2006.  She comes back today for followup.  She complains of  some black discharge and foul odor.   PHYSICAL EXAMINATION:  Her vitals are as noted on the chart.  She is a  well-developed, well-nourished female in no acute distress.  GYN:  Normal external female genitalia, vagina is pink and rugated.  BUS  was normal.  Cervix is healing well.  A picture of her cervix was taken  with her phone per her request.   IMPRESSION:  Recurrence of cervical intraepithelial neoplasia II.  Pathology results reviewed.  Show cervical intraepithelial neoplasia II  with negative margins.   PLAN:  Followup Pap in 4 months.           ______________________________  Tinnie Gens, MD     TP/MEDQ  D:  02/12/2006  T:  02/12/2006  Job:  [id number]

## 2010-10-16 LAB — CBC
HCT: 34 — ABNORMAL LOW
Hemoglobin: 12
MCHC: 35.2
RDW: 13.2

## 2010-10-16 LAB — URINALYSIS, ROUTINE W REFLEX MICROSCOPIC
Bilirubin Urine: NEGATIVE
Glucose, UA: NEGATIVE
Glucose, UA: NEGATIVE
Hgb urine dipstick: NEGATIVE
Ketones, ur: 40 — AB
Nitrite: NEGATIVE
Protein, ur: NEGATIVE
pH: 6

## 2010-10-16 LAB — WET PREP, GENITAL: Yeast Wet Prep HPF POC: NONE SEEN

## 2010-10-16 LAB — HCG, QUANTITATIVE, PREGNANCY: hCG, Beta Chain, Quant, S: 15720 — ABNORMAL HIGH

## 2010-10-16 LAB — GC/CHLAMYDIA PROBE AMP, GENITAL: Chlamydia, DNA Probe: NEGATIVE

## 2010-10-17 LAB — COMPREHENSIVE METABOLIC PANEL
ALT: 11
AST: 17
Calcium: 9.2
GFR calc Af Amer: >60
Sodium: 134 — ABNORMAL LOW
Total Protein: 6.5

## 2010-10-17 LAB — DIFFERENTIAL
Eosinophils Absolute: 0.1
Eosinophils Relative: 1
Lymphs Abs: 2.4
Monocytes Relative: 6

## 2010-10-17 LAB — CBC
MCHC: 34.9
Platelets: 201
RDW: 13.1

## 2010-10-19 ENCOUNTER — Inpatient Hospital Stay (INDEPENDENT_AMBULATORY_CARE_PROVIDER_SITE_OTHER)
Admission: RE | Admit: 2010-10-19 | Discharge: 2010-10-19 | Disposition: A | Discharge status: Home / Self Care | Payer: Medicare Other | Source: Ambulatory Visit | Attending: Emergency Medicine | Admitting: Emergency Medicine

## 2010-10-19 DIAGNOSIS — K069 Disorder of gingiva and edentulous alveolar ridge, unspecified: Secondary | ICD-10-CM

## 2011-05-15 ENCOUNTER — Ambulatory Visit: Payer: Medicare Other | Admitting: Obstetrics and Gynecology

## 2011-06-19 ENCOUNTER — Encounter: Payer: Medicare Other | Admitting: Physician Assistant

## 2011-12-15 ENCOUNTER — Emergency Department (INDEPENDENT_AMBULATORY_CARE_PROVIDER_SITE_OTHER)
Admission: EM | Admit: 2011-12-15 | Discharge: 2011-12-15 | Disposition: A | Discharge status: Home / Self Care | Payer: Medicare Other | Source: Home / Self Care | Attending: Family Medicine | Admitting: Family Medicine

## 2011-12-15 ENCOUNTER — Encounter (HOSPITAL_COMMUNITY): Payer: Self-pay | Admitting: Emergency Medicine

## 2011-12-15 DIAGNOSIS — K0889 Other specified disorders of teeth and supporting structures: Secondary | ICD-10-CM

## 2011-12-15 DIAGNOSIS — K089 Disorder of teeth and supporting structures, unspecified: Secondary | ICD-10-CM

## 2011-12-15 HISTORY — DX: Unqualified visual loss, right eye, normal vision left eye: H54.61

## 2011-12-15 HISTORY — DX: Blindness, one eye, unspecified eye: H54.40

## 2011-12-15 MED ORDER — CLINDAMYCIN HCL 300 MG PO CAPS: 300.0000 mg | ORAL_CAPSULE | Freq: Three times a day (TID) | ORAL | Status: DC

## 2011-12-15 MED ORDER — TRAMADOL HCL 50 MG PO TABS: 50.0000 mg | ORAL_TABLET | Freq: Four times a day (QID) | ORAL | Status: DC | PRN

## 2011-12-15 MED ORDER — IBUPROFEN 800 MG PO TABS: 800.0000 mg | ORAL_TABLET | Freq: Three times a day (TID) | ORAL | Status: DC

## 2011-12-15 NOTE — ED Provider Notes (Signed)
Medical screening examination/treatment/procedure(s) were performed by resident physician or non-physician practitioner and as supervising physician I was immediately available for consultation/collaboration.   Calyn Sivils DOUGLAS MD.    Leila Schuff D Yatzari Jonsson, MD 12/15/11 2132 

## 2011-12-15 NOTE — ED Provider Notes (Signed)
History     CSN: [id number]  Arrival date & time 12/15/11  1141   First MD Initiated Contact with Patient 12/15/11 1215      Chief Complaint  Patient presents with  . Dental Pain    (Consider location/radiation/quality/duration/timing/severity/associated sxs/prior treatment) Patient is a 36 y.o. female presenting with tooth pain. The history is provided by the patient.  Dental PainThe primary symptoms include mouth pain. The symptoms began 2 days ago. The symptoms are worsening. The symptoms are recurrent.  Mouth pain is worsening. Affected locations include: teeth and gum(s). At its highest the mouth pain was at 10/10.  Additional symptoms include: dental sensitivity to temperature, gum swelling, gum tenderness, jaw pain and facial swelling. Additional symptoms do not include: trismus, trouble swallowing, pain with swallowing, excessive salivation, dry mouth, taste disturbance, smell disturbance, drooling, ear pain, hearing loss, nosebleeds, swollen glands, goiter and fatigue. Medical issues include: smoking.    Past Medical History  Diagnosis Date  . Blind left eye   . Visual loss, right eye     Past Surgical History  Procedure Date  . Eye surgery     No family history on file.  History  Substance Use Topics  . Smoking status: Current Every Day Smoker  . Smokeless tobacco: Not on file  . Alcohol Use: No    OB History    Grav Para Term Preterm Abortions TAB SAB Ect Mult Living                  Review of Systems  Constitutional: Negative for fatigue.  HENT: Positive for facial swelling and dental problem. Negative for hearing loss, ear pain, nosebleeds, drooling and trouble swallowing.   All other systems reviewed and are negative.    Allergies  Penicillins  Home Medications   Current Outpatient Rx  Name  Route  Sig  Dispense  Refill  . PATADAY OP   Ophthalmic   Apply to eye.         Marland Kitchen VALSARTAN 320 MG PO TABS   Oral   Take 320 mg by mouth  daily.         Marland Kitchen VITAMIN D (ERGOCALCIFEROL) 50000 UNITS PO CAPS   Oral   Take 50,000 Units by mouth every 7 (seven) days.         Marland Kitchen CLINDAMYCIN HCL 300 MG PO CAPS   Oral   Take 1 capsule (300 mg total) by mouth 3 (three) times daily.   281 capsule   0   . IBUPROFEN 800 MG PO TABS   Oral   Take 1 tablet (800 mg total) by mouth 3 (three) times daily.   21 tablet   0   . TRAMADOL HCL 50 MG PO TABS   Oral   Take 1 tablet (50 mg total) by mouth every 6 (six) hours as needed for pain.   15 tablet   0     BP 156/103  Pulse 92  Temp 98.3 F (36.8 C) (Oral)  Resp 18  SpO2 98%  LMP 12/05/2011  Physical Exam  Nursing note and vitals reviewed. Constitutional: She is oriented to person, place, and time. Vital signs are normal. She appears well-developed and well-nourished. She is active and cooperative.  HENT:  Head: Normocephalic.  Right Ear: Tympanic membrane normal.  Left Ear: Tympanic membrane normal.  Nose: Nose normal. Right sinus exhibits no maxillary sinus tenderness and no frontal sinus tenderness. Left sinus exhibits no maxillary sinus tenderness and no frontal sinus  tenderness.  Mouth/Throat: Oropharynx is clear and moist and mucous membranes are normal. Abnormal dentition. Dental abscesses and dental caries present.  Eyes: Conjunctivae normal are normal. Pupils are equal, round, and reactive to light. No scleral icterus.  Neck: Trachea normal. Neck supple.  Cardiovascular: Normal rate and regular rhythm.   Pulmonary/Chest: Effort normal and breath sounds normal.  Neurological: She is alert and oriented to person, place, and time. No cranial nerve deficit or sensory deficit.  Skin: Skin is warm and dry.  Psychiatric: She has a normal mood and affect. Her speech is normal and behavior is normal. Judgment and thought content normal. Cognition and memory are normal.    ED Course  Procedures (including critical care time)  Labs Reviewed - No data to display No  results found.   1. Pain, dental       MDM  Medication as prescribed, follow up with dentis this week.        Johnsie Kindred, NP 12/15/11 1223

## 2011-12-15 NOTE — ED Notes (Signed)
Pt c/o dental pain x2 days... Sx include:pain on right side, bottom and top, swelling of gum, pain radiating to right ear... Denies: fevers, vomiting, nauseas, diarrhea... Pt is alert w/no signs of distress.

## 2011-12-17 NOTE — ED Notes (Signed)
Pt called requesting her antibiotic be changed.  States that it makes her sick even when taken with food.  Can't keep it down at all.  Will give request to Nira Conn RN.

## 2011-12-17 NOTE — ED Notes (Signed)
PT   Called   r

## 2012-02-29 ENCOUNTER — Encounter: Payer: Self-pay | Admitting: Physician Assistant

## 2012-02-29 NOTE — Progress Notes (Signed)
This encounter was created in error - please disregard.

## 2012-11-18 ENCOUNTER — Other Ambulatory Visit (HOSPITAL_COMMUNITY)
Admission: RE | Admit: 2012-11-18 | Discharge: 2012-11-18 | Disposition: A | Discharge status: Home / Self Care | Payer: Medicare Other | Source: Ambulatory Visit | Attending: Obstetrics & Gynecology | Admitting: Obstetrics & Gynecology

## 2012-11-18 ENCOUNTER — Encounter: Payer: Self-pay | Admitting: Obstetrics & Gynecology

## 2012-11-18 ENCOUNTER — Ambulatory Visit (INDEPENDENT_AMBULATORY_CARE_PROVIDER_SITE_OTHER): Payer: Medicare Other | Admitting: Obstetrics & Gynecology

## 2012-11-18 VITALS — BP 142/92 | HR 70 | Temp 97.7°F | Ht 63.0 in | Wt 147.0 lb

## 2012-11-18 DIAGNOSIS — Z124 Encounter for screening for malignant neoplasm of cervix: Secondary | ICD-10-CM | POA: Insufficient documentation

## 2012-11-18 DIAGNOSIS — Z01419 Encounter for gynecological examination (general) (routine) without abnormal findings: Secondary | ICD-10-CM

## 2012-11-18 DIAGNOSIS — Z113 Encounter for screening for infections with a predominantly sexual mode of transmission: Secondary | ICD-10-CM | POA: Insufficient documentation

## 2012-11-18 DIAGNOSIS — Z0000 Encounter for general adult medical examination without abnormal findings: Secondary | ICD-10-CM

## 2012-11-18 DIAGNOSIS — Z1151 Encounter for screening for human papillomavirus (HPV): Secondary | ICD-10-CM | POA: Insufficient documentation

## 2012-11-18 NOTE — Patient Instructions (Signed)
HPV Test The HPV (human papillomavirus) test is used to screen for high-risk types with HPV infection. HPV is a group of about 100 related viruses, of which 40 types are genital viruses. Most HPV viruses cause infections that usually resolve without treatment within 2 years. Some HPV infections can cause skin and genital warts (condylomata). HPV types 16, 18, 31 and 45 are considered high-risk types of HPV. High-risk types of HPV do not usually cause visible warts, but if untreated, may lead to cancers of the outlet of the womb (cervix) or anus. An HPV test identifies the DNA (genetic) strands of the HPV infection. Because the test identifies the DNA strands, the test is also referred to as the HPV DNA test. Although HPV is found in both males and females, the HPV test is only used to screen for cervical cancer in females. This test is recommended for females:  With an abnormal Pap test.  After treatment of an abnormal Pap test.  Aged 30 and older.  After treatment of a high-risk HPV infection. The HPV test may be done at the same time as a Pap test in females over the age of 30. Both the HPV and Pap test require a sample of cells from the cervix. PREPARATION FOR TEST  You may be asked to avoid douching, tampons, or vaginal medicines for 48 hours before the HPV test. You will be asked to urinate before the test. For the HPV test, you will need to lie on an exam table with your feet in stirrups. A spatula will be inserted into the vagina. The spatula will be used to swab the cervix for a cell and mucus sample. The sample will be further evaluated in a lab under a microscope. NORMAL FINDINGS  Normal: High-risk HPV is not found.  Ranges for normal findings may vary among different laboratories and hospitals. You should always check with your doctor after having lab work or other tests done to discuss the meaning of your test results and whether your values are considered within normal limits. MEANING  OF TEST An abnormal HPV test means that high-risk HPV is found. Your caregiver may recommend further testing. Your caregiver will go over the test results with you. He or she will and discuss the importance and meaning of your results, as well as treatment options and the need for additional tests, if necessary. OBTAINING THE RESULTS  It is your responsibility to obtain your test results. Ask the lab or department performing the test when and how you will get your results. Document Released: 02/07/2004 Document Revised: 04/06/2011 Document Reviewed: 10/22/2004 [employer] Patient Information 2014 [employer].  

## 2012-11-18 NOTE — Progress Notes (Signed)
Patient ID: [name], female   DOB: 01-11-1976, 37 y.o.   MRN: [id number] Subjective:     [name] is a 37 y.o. female here for a routine exam.  Current complaints: none. Pt on no contraception, does not want a pregnancy but, says that 'I will will not get pregnant'.       Gynecologic History Patient's last menstrual period was 11/06/2012. Contraception: none Last Pap: 2012. Results were: normal Last mammogram: never had.  Obstetric History OB History  Gravida Para Term Preterm AB SAB TAB Ectopic Multiple Living  5 3 2 1 2 1 1        # Outcome Date GA Lbr Len/2nd Weight Sex Delivery Anes PTL Lv  5 PRE 2011     SVD     4 TRM 1998     SVD     3 TRM 1997     SVD     2 TAB           1 SAB                The following portions of the patient's history were reviewed and updated as appropriate: allergies, current medications, past family history, past medical history, past social history, past surgical history and problem list.  Review of Systems Pertinent items are noted in HPI.    Objective:    BP 142/92  Pulse 70  Temp(Src) 97.7 F (36.5 C)  Ht 5\' 3"  (1.6 m)  Wt 147 lb (66.679 kg)  BMI 26.05 kg/m2  LMP 11/06/2012  General Appearance:    Alert, cooperative, no distress, appears stated age              Throat:   Lips, mucosa, and tongue normal; teeth and gums normal  Neck:   Supple, symmetrical, trachea midline, no adenopathy;    thyroid:  no enlargement/tenderness/nodules; no carotid   bruit or JVD  Back:     Symmetric, no curvature, ROM normal, no CVA tenderness  Lungs:     Clear to auscultation bilaterally, respirations unlabored  Chest Wall:    No tenderness or deformity   Heart:    Regular rate and rhythm, S1 and S2 normal, no murmur, rub   or gallop  Breast Exam:    No tenderness, masses, or nipple abnormality  Abdomen:     Soft, non-tender, bowel sounds active all four quadrants,    no masses, no organomegaly  Genitalia:    Normal female without  lesion, discharge or tenderness; uterus small; no adnexal masses     Extremities:   Extremities normal, atraumatic, no cyanosis or edema  Pulses:   2+ and symmetric all extremities  Skin:   Skin color, texture, turgor normal, no rashes or lesions  Lymph nodes:   Cervical, supraclavicular, and axillary nodes normal    Assessment:    Healthy female exam.  Pt declines birth control   Plan:    Contraception: none. Follow up in: 1 year.   F/u PAP and cx

## 2012-11-23 ENCOUNTER — Encounter: Payer: Self-pay | Admitting: *Deleted

## 2012-11-29 ENCOUNTER — Telehealth: Payer: Self-pay | Admitting: *Deleted

## 2012-11-29 NOTE — Telephone Encounter (Addendum)
Pt left message requesting test results information.   I returned pt's call and informed her of normal Pap, negative HPV, negative gonorrhea and negative chlamydia. She voiced understanding.

## 2012-12-01 ENCOUNTER — Other Ambulatory Visit: Payer: Self-pay

## 2012-12-07 ENCOUNTER — Other Ambulatory Visit: Payer: Self-pay | Admitting: Family Medicine

## 2012-12-07 DIAGNOSIS — N6310 Unspecified lump in the right breast, unspecified quadrant: Secondary | ICD-10-CM

## 2012-12-21 ENCOUNTER — Ambulatory Visit
Admission: RE | Admit: 2012-12-21 | Discharge: 2012-12-21 | Disposition: A | Discharge status: Home / Self Care | Payer: Medicare Other | Source: Ambulatory Visit | Attending: Family Medicine | Admitting: Family Medicine

## 2012-12-21 DIAGNOSIS — N6310 Unspecified lump in the right breast, unspecified quadrant: Secondary | ICD-10-CM

## 2012-12-23 ENCOUNTER — Other Ambulatory Visit: Payer: Medicare Other

## 2013-04-27 ENCOUNTER — Encounter (HOSPITAL_COMMUNITY): Payer: Self-pay | Admitting: Emergency Medicine

## 2013-04-27 ENCOUNTER — Emergency Department (HOSPITAL_COMMUNITY)
Admission: EM | Admit: 2013-04-27 | Discharge: 2013-04-27 | Disposition: A | Discharge status: Home / Self Care | Payer: Medicare Other | Attending: Emergency Medicine | Admitting: Emergency Medicine

## 2013-04-27 DIAGNOSIS — H5460 Unqualified visual loss, one eye, unspecified: Secondary | ICD-10-CM | POA: Insufficient documentation

## 2013-04-27 DIAGNOSIS — Y93G3 Activity, cooking and baking: Secondary | ICD-10-CM | POA: Insufficient documentation

## 2013-04-27 DIAGNOSIS — X020XXA Exposure to flames in controlled fire in building or structure, initial encounter: Secondary | ICD-10-CM | POA: Insufficient documentation

## 2013-04-27 DIAGNOSIS — H5440 Blindness, one eye, unspecified eye: Secondary | ICD-10-CM | POA: Insufficient documentation

## 2013-04-27 DIAGNOSIS — Z23 Encounter for immunization: Secondary | ICD-10-CM | POA: Insufficient documentation

## 2013-04-27 DIAGNOSIS — Z880 Allergy status to penicillin: Secondary | ICD-10-CM | POA: Insufficient documentation

## 2013-04-27 DIAGNOSIS — T23209A Burn of second degree of unspecified hand, unspecified site, initial encounter: Secondary | ICD-10-CM

## 2013-04-27 DIAGNOSIS — Z87891 Personal history of nicotine dependence: Secondary | ICD-10-CM | POA: Insufficient documentation

## 2013-04-27 DIAGNOSIS — I10 Essential (primary) hypertension: Secondary | ICD-10-CM | POA: Insufficient documentation

## 2013-04-27 DIAGNOSIS — E559 Vitamin D deficiency, unspecified: Secondary | ICD-10-CM | POA: Insufficient documentation

## 2013-04-27 DIAGNOSIS — T300 Burn of unspecified body region, unspecified degree: Secondary | ICD-10-CM | POA: Insufficient documentation

## 2013-04-27 DIAGNOSIS — Y929 Unspecified place or not applicable: Secondary | ICD-10-CM | POA: Insufficient documentation

## 2013-04-27 DIAGNOSIS — Z79899 Other long term (current) drug therapy: Secondary | ICD-10-CM | POA: Insufficient documentation

## 2013-04-27 MED ORDER — KETOROLAC TROMETHAMINE 30 MG/ML IJ SOLN
30.0000 mg | Freq: Once | INTRAMUSCULAR | Status: AC
  Administered 2013-04-27: 30 mg via INTRAVENOUS
  Filled 2013-04-27: qty 1

## 2013-04-27 MED ORDER — TETANUS-DIPHTH-ACELL PERTUSSIS 5-2.5-18.5 LF-MCG/0.5 IM SUSP
0.5000 mL | Freq: Once | INTRAMUSCULAR | Status: AC
  Administered 2013-04-27: 0.5 mL via INTRAMUSCULAR
  Filled 2013-04-27: qty 0.5

## 2013-04-27 MED ORDER — MORPHINE SULFATE 4 MG/ML IJ SOLN
4.0000 mg | Freq: Once | INTRAMUSCULAR | Status: DC
  Filled 2013-04-27: qty 1

## 2013-04-27 MED ORDER — SILVER SULFADIAZINE 1 % EX CREA: 1.0000 "application " | TOPICAL_CREAM | Freq: Two times a day (BID) | CUTANEOUS | Status: DC

## 2013-04-27 MED ORDER — HYDROCODONE-ACETAMINOPHEN 5-325 MG PO TABS: 2.0000 | ORAL_TABLET | ORAL | Status: DC | PRN

## 2013-04-27 NOTE — Discharge Instructions (Signed)
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
## 2013-11-27 ENCOUNTER — Encounter (HOSPITAL_COMMUNITY): Payer: Self-pay | Admitting: Emergency Medicine

## 2014-02-11 ENCOUNTER — Emergency Department (HOSPITAL_COMMUNITY)
Admission: EM | Admit: 2014-02-11 | Discharge: 2014-02-11 | Disposition: A | Discharge status: Home / Self Care | Payer: Medicare Other | Attending: Emergency Medicine | Admitting: Emergency Medicine

## 2014-02-11 ENCOUNTER — Encounter (HOSPITAL_COMMUNITY): Payer: Self-pay

## 2014-02-11 DIAGNOSIS — Z79899 Other long term (current) drug therapy: Secondary | ICD-10-CM | POA: Diagnosis not present

## 2014-02-11 DIAGNOSIS — Z8639 Personal history of other endocrine, nutritional and metabolic disease: Secondary | ICD-10-CM | POA: Diagnosis not present

## 2014-02-11 DIAGNOSIS — Z792 Long term (current) use of antibiotics: Secondary | ICD-10-CM | POA: Insufficient documentation

## 2014-02-11 DIAGNOSIS — Z87891 Personal history of nicotine dependence: Secondary | ICD-10-CM | POA: Diagnosis not present

## 2014-02-11 DIAGNOSIS — R21 Rash and other nonspecific skin eruption: Secondary | ICD-10-CM | POA: Diagnosis present

## 2014-02-11 DIAGNOSIS — Z880 Allergy status to penicillin: Secondary | ICD-10-CM | POA: Insufficient documentation

## 2014-02-11 DIAGNOSIS — H5442 Blindness, left eye, normal vision right eye: Secondary | ICD-10-CM | POA: Diagnosis not present

## 2014-02-11 DIAGNOSIS — I10 Essential (primary) hypertension: Secondary | ICD-10-CM | POA: Insufficient documentation

## 2014-02-11 MED ORDER — PREDNISONE 20 MG PO TABS: ORAL_TABLET | ORAL | Status: DC

## 2014-02-11 NOTE — ED Notes (Addendum)
Pt. Reports rash since Tuesday. Seen by PCP and given hydroxazine and claritan with no relief. Denies SOB, swelling. Mild nausea.

## 2014-02-11 NOTE — ED Provider Notes (Signed)
CSN: [id number]     Arrival date & time 02/11/14  2120 History  This chart was scribed for non-physician practitioner, Alecia Lemming, PA-C, working with Blanchie Dessert, MD, by Stephania Fragmin, ED Scribe. This patient was seen in room TR06C/TR06C and the patient's care was started at 9:51 PM.      Chief Complaint  Patient presents with  . Rash  . Allergic Reaction   The history is provided by the patient. No language interpreter was used.    HPI Comments: [name] is a 39 y.o. female who presents to the Emergency Department complaining of a gradual onset, itchy, bumpy rash that patient first noticed on her chest about 5 days ago after work. She then gradually developed itchy bumps on her posterior neck and back. Patient was seen by PCP and given hydroxyzine and Claritin with no relief. She also tried applying alcohol with no relief. Patient has had an allergic reaction to Gain laundry detergent in the past, but notes that today's symptoms are different. No new medications or foods. No mouth or lip swelling. No N/V, lightheadness/syncope.    Past Medical History  Diagnosis Date  . Blind left eye   . Visual loss, right eye   . Hypertension   . Vitamin D deficiency    Past Surgical History  Procedure Laterality Date  . Eye surgery     Family History  Problem Relation Age of Onset  . Hypertension Mother   . Hypertension Father   . Heart failure Father    History  Substance Use Topics  . Smoking status: Former Smoker -- 0.25 packs/day    Types: Cigarettes  . Smokeless tobacco: Never Used  . Alcohol Use: No   OB History    Gravida Para Term Preterm AB TAB SAB Ectopic Multiple Living   5 3 2 1 2 1 1         Review of Systems  Constitutional: Negative for fever.  HENT: Negative for facial swelling and trouble swallowing.   Eyes: Negative for redness.  Respiratory: Negative for shortness of breath, wheezing and stridor.   Cardiovascular: Negative for chest pain.   Gastrointestinal: Negative for nausea and vomiting.  Musculoskeletal: Negative for myalgias.  Skin: Positive for rash.  Neurological: Negative for light-headedness.  Psychiatric/Behavioral: Negative for confusion.    Allergies  Penicillins  Home Medications   Prior to Admission medications   Medication Sig Start Date End Date Taking? Authorizing Provider  Bepotastine Besilate (BEPREVE) 1.5 % SOLN Place 1 drop into both eyes daily as needed (irritation). Each eye    Historical Provider, MD  CHANTIX CONTINUING MONTH PAK 1 MG tablet Take 1 mg by mouth 2 (two) times daily.  04/06/13   Historical Provider, MD  clindamycin (CLEOCIN) 300 MG capsule Take 300 mg by mouth 2 (two) times daily. 7 day course 04/17/13   Historical Provider, MD  HYDROcodone-acetaminophen (NORCO/VICODIN) 5-325 MG per tablet Take 2 tablets by mouth every 4 (four) hours as needed for moderate pain. 04/27/13   Orpah Greek, MD  Multiple Vitamins-Minerals (HAIR/SKIN/NAILS/BIOTIN) TABS Take 1 tablet by mouth daily.    Historical Provider, MD  silver sulfADIAZINE (SILVADENE) 1 % cream Apply 1 application topically 2 (two) times daily. After blisters pop, until the area heals 04/27/13   Orpah Greek, MD  valsartan-hydrochlorothiazide (DIOVAN-HCT) 160-25 MG per tablet Take 1 tablet by mouth daily.  03/19/13   Historical Provider, MD   BP 145/116 mmHg  Pulse 97  Temp(Src) 98 F (  36.7 C) (Oral)  Resp 18  Ht 5\' 3"  (1.6 m)  Wt 139 lb (63.05 kg)  BMI 24.63 kg/m2  SpO2 97%  LMP 02/01/2014   Physical Exam  Constitutional: She is oriented to person, place, and time. She appears well-developed and well-nourished. No distress.  HENT:  Head: Normocephalic and atraumatic.  Eyes: Conjunctivae and EOM are normal.  Neck: Normal range of motion. Neck supple. No tracheal deviation present.  Cardiovascular: Normal rate.   No murmur heard. Pulmonary/Chest: Effort normal. No respiratory distress. She has no wheezes. She  has no rales.  Musculoskeletal: Normal range of motion.  Neurological: She is alert and oriented to person, place, and time.  Skin: Skin is warm and dry.  Patient with small ovoid papules noted on abdomen and upper chest. She does not currently have any rash on her back.  Psychiatric: She has a normal mood and affect. Her behavior is normal.  Nursing note and vitals reviewed.   ED Course  Procedures (including critical care time)  DIAGNOSTIC STUDIES: Oxygen Saturation is 97% on room air, normal by my interpretation.    COORDINATION OF CARE: 9:56 PM - Discussed treatment plan with pt at bedside which includes oral steroids (Prednisone) and pt agreed to plan.   Labs Review Labs Reviewed - No data to display  Imaging Review No results found.   EKG Interpretation None       10:07 PM Patient seen and examined.   Vital signs reviewed and are as follows: BP 122/96 mmHg  Pulse 88  Temp(Src) 98.3 F (36.8 C) (Oral)  Resp 16  Ht 5\' 3"  (1.6 m)  Wt 139 lb (63.05 kg)  BMI 24.63 kg/m2  SpO2 100%  LMP 02/01/2014  Will give course of prednisone to start tomorrow. Patient to continue Claritin and hydroxyzine at home.  PCP follow-up if symptoms not responsive to prednisone.  MDM   Final diagnoses:  Rash   Patient with nondescript rash, possible dermatitis or also consider pityriasis rosea given appearance however no classic herald patch. No angioedema or other signs of anaphylaxis.  I personally performed the services described in this documentation, which was scribed in my presence. The recorded information has been reviewed and is accurate.     Carlisle Cater, PA-C 02/11/14 2209  Blanchie Dessert, MD 02/11/14 2238

## 2014-02-11 NOTE — Discharge Instructions (Signed)
Please read and follow all provided instructions.  Your diagnoses today include:  1. Rash    Tests performed today include:  Vital signs. See below for your results today.   Medications prescribed:   Prednisone - steroid medicine   It is best to take this medication in the morning to prevent sleeping problems. If you are diabetic, monitor your blood sugar closely and stop taking Prednisone if blood sugar is over 300. Take with food to prevent stomach upset.   Take any prescribed medications only as directed.  Home care instructions:   Follow any educational materials contained in this packet  Use Claritin during the day and hydroxyzine at night for symptom control  Follow-up instructions: Please follow-up with your primary care provider in the next 3 days for further evaluation of your symptoms.   Return instructions:   Please return to the Emergency Department if you experience worsening symptoms.   Call 9-1-1 immediately if you have an allergic reaction that involves your lips, mouth, throat or if you have any difficulty breathing. This is a life-threatening emergency.   Please return if you have any other emergent concerns.  Additional Information:  Your vital signs today were: BP 145/116 mmHg   Pulse 97   Temp(Src) 98 F (36.7 C) (Oral)   Resp 18   Ht 5\' 3"  (1.6 m)   Wt 139 lb (63.05 kg)   BMI 24.63 kg/m2   SpO2 97%   LMP 02/01/2014 If your blood pressure (BP) was elevated above 135/85 this visit, please have this repeated by your doctor within one month. --------------

## 2014-03-21 ENCOUNTER — Emergency Department (HOSPITAL_COMMUNITY): Payer: Medicare Other

## 2014-03-21 ENCOUNTER — Emergency Department (HOSPITAL_COMMUNITY)
Admission: EM | Admit: 2014-03-21 | Discharge: 2014-03-21 | Disposition: A | Discharge status: Home / Self Care | Payer: Medicare Other | Attending: Emergency Medicine | Admitting: Emergency Medicine

## 2014-03-21 ENCOUNTER — Encounter (HOSPITAL_COMMUNITY): Payer: Self-pay | Admitting: *Deleted

## 2014-03-21 DIAGNOSIS — Z8639 Personal history of other endocrine, nutritional and metabolic disease: Secondary | ICD-10-CM | POA: Insufficient documentation

## 2014-03-21 DIAGNOSIS — Z720 Tobacco use: Secondary | ICD-10-CM | POA: Insufficient documentation

## 2014-03-21 DIAGNOSIS — H5442 Blindness, left eye, normal vision right eye: Secondary | ICD-10-CM | POA: Diagnosis not present

## 2014-03-21 DIAGNOSIS — I10 Essential (primary) hypertension: Secondary | ICD-10-CM | POA: Insufficient documentation

## 2014-03-21 DIAGNOSIS — Z792 Long term (current) use of antibiotics: Secondary | ICD-10-CM | POA: Diagnosis not present

## 2014-03-21 DIAGNOSIS — Z880 Allergy status to penicillin: Secondary | ICD-10-CM | POA: Diagnosis not present

## 2014-03-21 DIAGNOSIS — Z79899 Other long term (current) drug therapy: Secondary | ICD-10-CM | POA: Insufficient documentation

## 2014-03-21 DIAGNOSIS — H5461 Unqualified visual loss, right eye, normal vision left eye: Secondary | ICD-10-CM | POA: Insufficient documentation

## 2014-03-21 DIAGNOSIS — J40 Bronchitis, not specified as acute or chronic: Secondary | ICD-10-CM | POA: Diagnosis not present

## 2014-03-21 DIAGNOSIS — Z7952 Long term (current) use of systemic steroids: Secondary | ICD-10-CM | POA: Diagnosis not present

## 2014-03-21 DIAGNOSIS — R51 Headache: Secondary | ICD-10-CM | POA: Diagnosis present

## 2014-03-21 MED ORDER — ALBUTEROL SULFATE HFA 108 (90 BASE) MCG/ACT IN AERS: 1.0000 | INHALATION_SPRAY | Freq: Four times a day (QID) | RESPIRATORY_TRACT | Status: DC | PRN

## 2014-03-21 MED ORDER — AZITHROMYCIN 250 MG PO TABS: 250.0000 mg | ORAL_TABLET | Freq: Every day | ORAL | Status: DC

## 2014-03-21 NOTE — Discharge Instructions (Signed)

## 2014-03-21 NOTE — ED Provider Notes (Signed)
CSN: [id number]     Arrival date & time 03/21/14  1800 History  This chart was scribed for non-physician practitioner, Hollace Kinnier. Threasa Alpha, PA-C working with Threasa Beards, MD by Tula Nakayama, ED scribe. This patient was seen in room TR07C/TR07C and the patient's care was started at 7:43 PM   Chief Complaint  Patient presents with  . Cough  . Headache   The history is provided by the patient. No language interpreter was used.   HPI Comments: [name] is a 39 y.o. female who presents to the Emergency Department complaining of intermittent productive cough that started 12 days ago. She states congestion and fever as associated symptoms. Pt notes that symptoms improved briefly and then became worse. She did not get her flu shot this year and denies sick contact. Pt has not been smoking since the onset of symptoms. She is allergic to penicillin.   PCP at [facility]  Past Medical History  Diagnosis Date  . Blind left eye   . Visual loss, right eye   . Hypertension   . Vitamin D deficiency    Past Surgical History  Procedure Laterality Date  . Eye surgery     Family History  Problem Relation Age of Onset  . Hypertension Mother   . Hypertension Father   . Heart failure Father    History  Substance Use Topics  . Smoking status: Current Every Day Smoker -- 0.25 packs/day    Types: Cigarettes  . Smokeless tobacco: Never Used  . Alcohol Use: No   OB History    Gravida Para Term Preterm AB TAB SAB Ectopic Multiple Living   5 3 2 1 2 1 1         Review of Systems  Constitutional: Positive for fever.  HENT: Positive for congestion.   Respiratory: Positive for cough.   All other systems reviewed and are negative.     Allergies  Penicillins  Home Medications   Prior to Admission medications   Medication Sig Start Date End Date Taking? Authorizing Provider  Bepotastine Besilate (BEPREVE) 1.5 % SOLN Place 1 drop into both eyes daily as needed (irritation).  Each eye    Historical Provider, MD  CHANTIX CONTINUING MONTH PAK 1 MG tablet Take 1 mg by mouth 2 (two) times daily.  04/06/13   Historical Provider, MD  clindamycin (CLEOCIN) 300 MG capsule Take 300 mg by mouth 2 (two) times daily. 7 day course 04/17/13   Historical Provider, MD  HYDROcodone-acetaminophen (NORCO/VICODIN) 5-325 MG per tablet Take 2 tablets by mouth every 4 (four) hours as needed for moderate pain. 04/27/13   Orpah Greek, MD  Multiple Vitamins-Minerals (HAIR/SKIN/NAILS/BIOTIN) TABS Take 1 tablet by mouth daily.    Historical Provider, MD  predniSONE (DELTASONE) 20 MG tablet 3 Tabs PO Days 1-3, then 2 tabs PO Days 4-6, then 1 tab PO Day 7-9, then Half Tab PO Day 10-12 02/11/14   Carlisle Cater, PA-C  silver sulfADIAZINE (SILVADENE) 1 % cream Apply 1 application topically 2 (two) times daily. After blisters pop, until the area heals 04/27/13   Orpah Greek, MD  valsartan-hydrochlorothiazide (DIOVAN-HCT) 160-25 MG per tablet Take 1 tablet by mouth daily.  03/19/13   Historical Provider, MD   BP 152/105 mmHg  Pulse 92  Temp(Src) 98.5 F (36.9 C) (Oral)  Resp 16  Ht 5\' 3"  (1.6 m)  Wt 140 lb (63.504 kg)  BMI 24.81 kg/m2  SpO2 96%  LMP 03/20/2014 (Exact Date)  Physical Exam  Constitutional: She appears well-developed and well-nourished. No distress.  HENT:  Head: Normocephalic and atraumatic.  Eyes: Conjunctivae and EOM are normal.  Neck: Neck supple. No tracheal deviation present.  Cardiovascular: Normal rate, regular rhythm and normal heart sounds.   Pulmonary/Chest: Effort normal and breath sounds normal. No respiratory distress.  Hacking cough  Skin: Skin is warm and dry.  Psychiatric: She has a normal mood and affect. Her behavior is normal.  Nursing note and vitals reviewed.   ED Course  Procedures  DIAGNOSTIC STUDIES: Oxygen Saturation is 96% on RA, normal by my interpretation.    COORDINATION OF CARE: 7:47 PM Discussed x-rays and treatment plan with  pt. She agreed to plan.    Labs Review Labs Reviewed - No data to display  Imaging Review Dg Chest 2 View  03/21/2014   CLINICAL DATA:  Initial encounter for twelve day history of cough and sore throat with headache.  EXAM: CHEST  2 VIEW  COMPARISON:  11/11/2004  FINDINGS: The heart size and mediastinal contours are within normal limits. Both lungs are clear. The visualized skeletal structures are unremarkable.  IMPRESSION: No active cardiopulmonary disease.   Electronically Signed   By: Misty Stanley M.D.   On: 03/21/2014 18:42     EKG Interpretation None      MDM   Final diagnoses:  Bronchitis    zithromax Albuterol See your Physicain for recheck in 1 week if symptoms persist   Fransico Meadow, PA-C 03/21/14 [place], MD 03/21/14 [phone]

## 2014-03-21 NOTE — ED Notes (Signed)
Pt made aware to return if symptoms worsen or if any life threatening symptoms occur.   

## 2014-11-27 ENCOUNTER — Encounter: Payer: Self-pay | Admitting: *Deleted

## 2014-12-27 ENCOUNTER — Ambulatory Visit: Payer: Medicare Other | Admitting: Obstetrics and Gynecology

## 2015-01-11 ENCOUNTER — Other Ambulatory Visit (HOSPITAL_COMMUNITY)
Admission: RE | Admit: 2015-01-11 | Discharge: 2015-01-11 | Disposition: A | Discharge status: Home / Self Care | Payer: Medicare Other | Source: Ambulatory Visit | Attending: Obstetrics and Gynecology | Admitting: Obstetrics and Gynecology

## 2015-01-11 ENCOUNTER — Ambulatory Visit (INDEPENDENT_AMBULATORY_CARE_PROVIDER_SITE_OTHER): Payer: Medicare Other | Admitting: Obstetrics and Gynecology

## 2015-01-11 ENCOUNTER — Encounter: Payer: Self-pay | Admitting: Obstetrics and Gynecology

## 2015-01-11 VITALS — BP 148/93 | HR 83 | Temp 98.3°F | Ht 64.0 in | Wt 156.3 lb

## 2015-01-11 DIAGNOSIS — G8918 Other acute postprocedural pain: Secondary | ICD-10-CM

## 2015-01-11 DIAGNOSIS — Z01812 Encounter for preprocedural laboratory examination: Secondary | ICD-10-CM | POA: Diagnosis not present

## 2015-01-11 DIAGNOSIS — N938 Other specified abnormal uterine and vaginal bleeding: Secondary | ICD-10-CM | POA: Insufficient documentation

## 2015-01-11 DIAGNOSIS — Z3202 Encounter for pregnancy test, result negative: Secondary | ICD-10-CM

## 2015-01-11 LAB — POCT PREGNANCY, URINE: PREG TEST UR: NEGATIVE

## 2015-01-11 MED ORDER — IBUPROFEN 200 MG PO TABS
600.0000 mg | ORAL_TABLET | Freq: Once | ORAL | Status: AC
  Administered 2015-01-11: 600 mg via ORAL

## 2015-01-11 NOTE — Progress Notes (Addendum)
In October had period that lasted 2 weeks. Went to doctor and had ultrasound done.   [name] elects to schedule follow up ultrasound with her PCP [facility] that sent her to [place] and will have them send [place] the results.  Ibuprofen 600 mg given for postprocedure pain =10.

## 2015-01-11 NOTE — Addendum Note (Signed)
Addended by: Mora Bellman on: 01/11/2015 09:03 AM   Modules accepted: Orders
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
## 2015-01-11 NOTE — Addendum Note (Signed)
Addended by: Samuel Germany on: 01/11/2015 09:05 AM   Modules accepted: Orders

## 2015-01-15 ENCOUNTER — Telehealth: Payer: Self-pay | Admitting: *Deleted

## 2015-01-15 NOTE — Telephone Encounter (Signed)
Patient returned call. Negative endometrial biopsy results given to patient. Patient asked if she still needed to get her ultrasound. Encouraged patient to go ahead and get the ultrasound which will rule out any other causes of her bleeding. Patient voiced understanding and said she would do this.

## 2015-01-15 NOTE — Telephone Encounter (Addendum)
Per Dr. Elly Modena patient had negative endometrial biopsy and would like patient to be notified.   Called [facility] mobile number and unable to leave a message - heard message voicemail not set up yet.  Called home number and left a message we are calling with some non-urgent information - please call clinic. If we may Leave detailed information on your voicemail - please leave that message on our voicemail.

## 2015-03-27 ENCOUNTER — Encounter: Payer: Self-pay | Admitting: *Deleted

## 2015-04-02 ENCOUNTER — Other Ambulatory Visit: Payer: Self-pay | Admitting: Family Medicine

## 2015-04-02 DIAGNOSIS — Z1231 Encounter for screening mammogram for malignant neoplasm of breast: Secondary | ICD-10-CM

## 2015-04-19 ENCOUNTER — Ambulatory Visit
Admission: RE | Admit: 2015-04-19 | Discharge: 2015-04-19 | Disposition: A | Discharge status: Home / Self Care | Payer: Medicare Other | Source: Ambulatory Visit | Attending: Family Medicine | Admitting: Family Medicine

## 2015-04-19 DIAGNOSIS — Z1231 Encounter for screening mammogram for malignant neoplasm of breast: Secondary | ICD-10-CM

## 2016-03-24 ENCOUNTER — Other Ambulatory Visit: Payer: Self-pay | Admitting: Family Medicine

## 2016-03-24 DIAGNOSIS — Z1231 Encounter for screening mammogram for malignant neoplasm of breast: Secondary | ICD-10-CM

## 2016-04-21 ENCOUNTER — Ambulatory Visit
Admission: RE | Admit: 2016-04-21 | Discharge: 2016-04-21 | Disposition: A | Discharge status: Home / Self Care | Payer: Medicare Other | Source: Ambulatory Visit | Attending: Family Medicine | Admitting: Family Medicine

## 2016-04-21 DIAGNOSIS — Z1231 Encounter for screening mammogram for malignant neoplasm of breast: Secondary | ICD-10-CM

## 2016-04-22 ENCOUNTER — Other Ambulatory Visit: Payer: Self-pay | Admitting: Family Medicine

## 2016-04-22 DIAGNOSIS — R928 Other abnormal and inconclusive findings on diagnostic imaging of breast: Secondary | ICD-10-CM

## 2016-04-24 ENCOUNTER — Ambulatory Visit
Admission: RE | Admit: 2016-04-24 | Discharge: 2016-04-24 | Disposition: A | Discharge status: Home / Self Care | Payer: Medicare Other | Source: Ambulatory Visit | Attending: Family Medicine | Admitting: Family Medicine

## 2016-04-24 DIAGNOSIS — R928 Other abnormal and inconclusive findings on diagnostic imaging of breast: Secondary | ICD-10-CM

## 2016-06-09 ENCOUNTER — Encounter: Payer: Self-pay | Admitting: Registered"

## 2016-06-09 ENCOUNTER — Encounter: Payer: Medicare Other | Attending: Family Medicine | Admitting: Registered"

## 2016-06-09 DIAGNOSIS — E7800 Pure hypercholesterolemia, unspecified: Secondary | ICD-10-CM | POA: Insufficient documentation

## 2016-06-09 DIAGNOSIS — Z713 Dietary counseling and surveillance: Secondary | ICD-10-CM | POA: Diagnosis present

## 2016-06-09 NOTE — Patient Instructions (Addendum)
   Seafood website https://santos.com/ Aim to get fish 2-3 week.  Milk is fine, aim for low fat.  Berries in your diet daily is a good idea  Nuts, 1-2 handfuls per day, is good. Pistachios, peanuts, almonds, walnuts.  When having salads as a meal be sure to include a protein.  Consider changing the biscuit for an [place] Muffin.  Consider having oatmeal a few times a week.  When eating out, look at restaurant's website first to look at nutrition facts to find the choice that are lower in sodium and saturated fats  Snack ideas: Hummus with veggies for snacks, peanut butter with celery or fruit.

## 2016-06-09 NOTE — Progress Notes (Signed)
Medical Nutrition Therapy:  Appt start time: 0800 end time:  0955.  Assessment:  Primary concerns today: Pt states she wants to know how to eat right to stay healthy and get off medication. Patient states she has cut out [employer], and doesn't go to [employer] for her mid-morning snack anymore. Pt states she is seeing her family members having serious health issues and wants to take steps to avoid this.  Pt states she is using tumeric. Pt is taking over the counter vitamin D and believed the dose is 500 iu. RD advised her to check it and if it is that low and if it is ask her doctor if she should be taking 2 to get 1000 iu.  Preferred Learning Style:   Auditory  Hands on  Learning Readiness:  Change in progress  MEDICATIONS: reveiwed   DIETARY INTAKE:  Usual eating pattern includes 3 meals and 2 snacks per day.  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  Estimated energy needs: 1800 calories 200 g carbohydrates 135 g protein 50 g fat  Progress Towards Goal(s):  In progress.   Nutritional Diagnosis:  NI-5.6.3 Inappropriate intake of fats (specify): saturated fats As related to history of fast food.  As evidenced by dietary recall and hypercholesteremia.    Intervention:  Nutrition Education. Reviewed macronutrients and their role in a balanced diet to promote health. Discussed different types of fat. Discussed the importance of the timing of meals and using hunger cues.  Teaching Method Utilized:  Visual (pt. Is visually impaired, but can see if font size is increased enough) Auditory Hands on  Handouts given during visit include:  My Plate  Barriers to learning/adherence  to lifestyle change: none  Demonstrated degree of understanding via:  Teach Back   Monitoring/Evaluation:  Dietary intake, exercise, and body weight prn.

## 2016-06-20 ENCOUNTER — Encounter (HOSPITAL_COMMUNITY): Payer: Self-pay | Admitting: *Deleted

## 2016-06-20 ENCOUNTER — Inpatient Hospital Stay (HOSPITAL_COMMUNITY)
Admission: AD | Admit: 2016-06-20 | Discharge: 2016-06-20 | Disposition: A | Discharge status: Home / Self Care | Payer: Medicare Other | Source: Ambulatory Visit | Attending: Obstetrics and Gynecology | Admitting: Obstetrics and Gynecology

## 2016-06-20 DIAGNOSIS — N938 Other specified abnormal uterine and vaginal bleeding: Secondary | ICD-10-CM | POA: Insufficient documentation

## 2016-06-20 HISTORY — DX: Benign neoplasm of connective and other soft tissue, unspecified: D21.9

## 2016-06-20 LAB — CBC WITH DIFFERENTIAL/PLATELET
BASOS ABS: 0 10*3/uL (ref 0.0–0.1)
Basophils Relative: 0 %
EOS PCT: 1 %
Eosinophils Absolute: 0 10*3/uL (ref 0.0–0.7)
HCT: 35.3 % — ABNORMAL LOW (ref 36.0–46.0)
HEMOGLOBIN: 12 g/dL (ref 12.0–15.0)
LYMPHS ABS: 1.9 10*3/uL (ref 0.7–4.0)
Lymphocytes Relative: 32 %
MCH: 29.8 pg (ref 26.0–34.0)
MCHC: 34 g/dL (ref 30.0–36.0)
MCV: 87.6 fL (ref 78.0–100.0)
Monocytes Absolute: 0.1 10*3/uL (ref 0.1–1.0)
Monocytes Relative: 2 %
NEUTROS PCT: 65 %
Neutro Abs: 3.7 10*3/uL (ref 1.7–7.7)
PLATELETS: 251 10*3/uL (ref 150–400)
RBC: 4.03 MIL/uL (ref 3.87–5.11)
RDW: 14.2 % (ref 11.5–15.5)
WBC: 5.8 10*3/uL (ref 4.0–10.5)

## 2016-06-20 LAB — POCT PREGNANCY, URINE: PREG TEST UR: NEGATIVE

## 2016-06-20 LAB — URINALYSIS, ROUTINE W REFLEX MICROSCOPIC
Bilirubin Urine: NEGATIVE
Glucose, UA: NEGATIVE mg/dL
Ketones, ur: NEGATIVE mg/dL
Leukocytes, UA: NEGATIVE
NITRITE: NEGATIVE
PROTEIN: NEGATIVE mg/dL
pH: 5.5 (ref 5.0–8.0)

## 2016-06-20 LAB — URINALYSIS, MICROSCOPIC (REFLEX): WBC UA: NONE SEEN WBC/hpf (ref 0–5)

## 2016-06-20 MED ORDER — IBUPROFEN 800 MG PO TABS: 800.0000 mg | ORAL_TABLET | Freq: Three times a day (TID) | ORAL | 1 refills | Status: DC

## 2016-06-20 MED ORDER — MEGESTROL ACETATE 40 MG PO TABS: 40.0000 mg | ORAL_TABLET | Freq: Three times a day (TID) | ORAL | 0 refills | Status: DC

## 2016-06-20 MED ORDER — OXYCODONE-ACETAMINOPHEN 5-325 MG PO TABS
2.0000 | ORAL_TABLET | Freq: Once | ORAL | Status: AC
Start: 2016-06-20 — End: 2016-06-20
  Administered 2016-06-20: 2 via ORAL
  Filled 2016-06-20: qty 2

## 2016-06-20 NOTE — Discharge Instructions (Signed)
Dysfunctional Uterine Bleeding Dysfunctional uterine bleeding is abnormal bleeding from the uterus. Dysfunctional uterine bleeding includes:  A period that comes earlier or later than usual.  A period that is lighter, heavier, or has blood clots.  Bleeding between periods.  Skipping one or more periods.  Bleeding after sexual intercourse.  Bleeding after menopause. Follow these instructions at home: Pay attention to any changes in your symptoms. Follow these instructions to help with your condition: Eating and drinking   Eat well-balanced meals. Include foods that are high in iron, such as liver, meat, shellfish, green leafy vegetables, and eggs.  If you become constipated:  Drink plenty of water.  Eat fruits and vegetables that are high in water and fiber, such as spinach, carrots, raspberries, apples, and mango. Medicines   Take over-the-counter and prescription medicines only as told by your health care provider.  Do not change medicines without talking with your health care provider.  Aspirin or medicines that contain aspirin may make the bleeding worse. Do not take those medicines:  During the week before your period.  During your period.  If you were prescribed iron pills, take them as told by your health care provider. Iron pills help to replace iron that your body loses because of this condition. Activity   If you need to change your sanitary pad or tampon more than one time every 2 hours:  Lie in bed with your feet raised (elevated).  Place a cold pack on your lower abdomen.  Rest as much as possible until the bleeding stops or slows down.  Do not try to lose weight until the bleeding has stopped and your blood iron level is back to normal. Other Instructions   For two months, write down:  When your period starts.  When your period ends.  When any abnormal bleeding occurs.  What problems you notice.  Keep all follow up visits as told by your  health care provider. This is important. Contact a health care provider if:  You get light-headed or weak.  You have nausea and vomiting.  You cannot eat or drink without vomiting.  You feel dizzy or have diarrhea while you are taking medicines.  You are taking birth control pills or hormones, and you want to change them or stop taking them. Get help right away if:  You develop a fever or chills.  You need to change your sanitary pad or tampon more than one time per hour.  Your bleeding becomes heavier, or your flow contains clots more often.  You develop pain in your abdomen.  You lose consciousness.  You develop a rash. This information is not intended to replace advice given to you by your health care provider. Make sure you discuss any questions you have with your health care provider. Document Released: 01/10/2000 Document Revised: 06/20/2015 Document Reviewed: 04/09/2014 Elsevier Interactive Patient Education  2017 Elsevier Inc.  

## 2016-06-20 NOTE — MAU Note (Signed)
Had a regular period and stopped bleeding for one day aand then started bleeding again;C/o vaginal bleeding for past 14 days with large blood clotsand severe cramping  And also c/o urinary frequency; c/o feeling her cramping down her hips and legs;

## 2016-06-20 NOTE — MAU Provider Note (Signed)
History   41 yo black female in today with c/o heavy bleeding and cramping with clots for two weeks. Pt has several year hx of DUB and has bee seen in GYN clinic.  CSN: [id number]  Arrival date & time 06/20/16  1020   None     Chief Complaint  Patient presents with  . Vaginal Bleeding    HPI  Past Medical History:  Diagnosis Date  . Blind left eye   . Hypertension   . Vaginal Pap smear, abnormal   . Visual loss, right eye   . Vitamin D deficiency     Past Surgical History:  Procedure Laterality Date  . cryotheraphy    . EYE SURGERY      Family History  Problem Relation Age of Onset  . Hypertension Mother   . Hypertension Father   . Heart failure Father     Social History  Substance Use Topics  . Smoking status: Current Every Day Smoker    Packs/day: 0.25    Types: Cigarettes  . Smokeless tobacco: Never Used  . Alcohol use No    OB History    Gravida Para Term Preterm AB Living   5 3 2 1 2      SAB TAB Ectopic Multiple Live Births   1 1            Review of Systems  Constitutional: Negative.   HENT: Negative.   Eyes: Negative.   Respiratory: Negative.   Cardiovascular: Negative.   Gastrointestinal: Positive for abdominal pain.  Endocrine: Negative.   Genitourinary: Positive for vaginal bleeding.  Musculoskeletal: Negative.   Skin: Negative.     Allergies  Penicillins  Home Medications    BP (!) 156/85   Pulse 82   Temp 98.4 F (36.9 C) (Oral)   Resp 18   Physical Exam  Constitutional: She is oriented to person, place, and time. She appears well-developed and well-nourished.  HENT:  Head: Normocephalic.  Eyes: Pupils are equal, round, and reactive to light.  Neck: Normal range of motion.  Cardiovascular: Normal rate, normal heart sounds and intact distal pulses.   Pulmonary/Chest: Effort normal and breath sounds normal.  Abdominal: Soft. Bowel sounds are normal.  Genitourinary: Vagina normal and uterus normal.  Musculoskeletal:  Normal range of motion.  Neurological: She is alert and oriented to person, place, and time. She has normal reflexes.  Skin: Skin is warm and dry.  Psychiatric: She has a normal mood and affect. Her behavior is normal. Judgment and thought content normal.    MAU Course  Procedures (including critical care time)  Labs Reviewed  URINALYSIS, ROUTINE W REFLEX MICROSCOPIC  CBC WITH DIFFERENTIAL/PLATELET  POCT PREGNANCY, URINE  GC/CHLAMYDIA PROBE AMP ([place]) NOT AT [facility]   No results found.   1. DUB (dysfunctional uterine bleeding)       MDM  Uterus NSSC and non tender , cervix smooth pink and bulbous, scant amt dark bleeding with sm clots in os noted. Cultures obtained. CBC is stable and bleeding is scant amt at present time. Message sent to GYN clinic to get pt in for work up ASAP will treat with megace TID and d/c home

## 2016-06-23 ENCOUNTER — Encounter: Payer: Self-pay | Admitting: Family Medicine

## 2016-06-23 LAB — GC/CHLAMYDIA PROBE AMP (~~LOC~~) NOT AT ARMC
Chlamydia: NEGATIVE
Neisseria Gonorrhea: NEGATIVE

## 2016-06-29 ENCOUNTER — Ambulatory Visit: Payer: Medicare Other | Admitting: Family Medicine

## 2016-07-31 ENCOUNTER — Encounter: Payer: Self-pay | Admitting: Family Medicine

## 2016-07-31 ENCOUNTER — Ambulatory Visit (INDEPENDENT_AMBULATORY_CARE_PROVIDER_SITE_OTHER): Payer: Medicare Other | Admitting: Family Medicine

## 2016-07-31 ENCOUNTER — Other Ambulatory Visit (HOSPITAL_COMMUNITY)
Admission: RE | Admit: 2016-07-31 | Discharge: 2016-07-31 | Disposition: A | Discharge status: Home / Self Care | Payer: Medicare Other | Source: Ambulatory Visit | Attending: Family Medicine | Admitting: Family Medicine

## 2016-07-31 VITALS — BP 129/85 | HR 83 | Ht 63.0 in | Wt 148.5 lb

## 2016-07-31 DIAGNOSIS — Z124 Encounter for screening for malignant neoplasm of cervix: Secondary | ICD-10-CM

## 2016-07-31 DIAGNOSIS — N920 Excessive and frequent menstruation with regular cycle: Secondary | ICD-10-CM | POA: Insufficient documentation

## 2016-07-31 DIAGNOSIS — Z1151 Encounter for screening for human papillomavirus (HPV): Secondary | ICD-10-CM

## 2016-07-31 NOTE — Progress Notes (Signed)
Patient complain of pain in her hips and she cannot stand for long periods of time. Patient also complains of abnormal bleeding and states she is here today for followup visit  For pap.

## 2016-07-31 NOTE — Assessment & Plan Note (Signed)
Check TSH and pelvic sonogram--then decide on more definitive treatment options. Uninterested in contraception.

## 2016-07-31 NOTE — Patient Instructions (Signed)

## 2016-07-31 NOTE — Progress Notes (Signed)
   Subjective:    Patient ID: [name] is a 41 y.o. female presenting with Gynecologic Exam  on 07/31/2016  HPI: States she is having some significant bleeding. Noted to have some cycles that are a bit longer than usual or go off and come back. Sometimes heavy with clots. Has h/o fibroids. Other cycles are more normal for her.  Review of Systems  Constitutional: Negative for chills and fever.  Respiratory: Negative for shortness of breath.   Cardiovascular: Negative for chest pain.  Gastrointestinal: Negative for abdominal pain, nausea and vomiting.  Genitourinary: Positive for vaginal bleeding. Negative for dysuria.  Musculoskeletal: Positive for arthralgias (hips).       LE swelling  Skin: Negative for rash.      Objective:    BP 129/85   Pulse 83   Ht 5\' 3"  (1.6 m)   Wt 148 lb 8 oz (67.4 kg)   LMP 07/11/2016 (Exact Date)   BMI 26.31 kg/m  Physical Exam  Constitutional: She is oriented to person, place, and time. She appears well-developed and well-nourished. No distress.  HENT:  Head: Normocephalic and atraumatic.  Eyes: No scleral icterus.  Neck: Neck supple.  Cardiovascular: Normal rate.   Pulmonary/Chest: Effort normal.  Abdominal: Soft.  Genitourinary:  Genitourinary Comments: BUS normal, vagina is pink and rugated, cervix is multiparous without lesion, uterus is 8 wk size and firm and anteverted, no adnexal mass or tenderness.   Neurological: She is alert and oriented to person, place, and time.  Skin: Skin is warm and dry.  Psychiatric: She has a normal mood and affect.      Assessment & Plan:   Problem List Items Addressed This Visit      Unprioritized   Menorrhagia - Primary    Check TSH and pelvic sonogram--then decide on more definitive treatment options. Uninterested in contraception.      Relevant Orders   Cytology - PAP   TSH   US Transvaginal Non-OB      Total face-to-face time with patient: 15 minutes. Over 50% of encounter was spent  on counseling and coordination of care. Return in about 4 weeks (around 08/28/2016) for a follow-up.  Donnamae Jude 07/31/2016 11:11 AM

## 2016-08-01 LAB — TSH: TSH: 0.769 u[IU]/mL (ref 0.450–4.500)

## 2016-08-03 ENCOUNTER — Telehealth: Payer: Self-pay | Admitting: Lab

## 2016-08-03 LAB — CYTOLOGY - PAP
ADEQUACY: ABSENT
Diagnosis: NEGATIVE
HPV (WINDOPATH): NOT DETECTED

## 2016-08-03 NOTE — Telephone Encounter (Addendum)
Called Patient to give her the ultrasound date and time that we have scheduled for her. Patient stated that she didn't want the appointment also she was going to go to another doctor because she felt Dr Kennon Rounds did not do a thorough enough pap for her. Spoke with Morey Hummingbird about situation and she advised me to call patient back talk with her to see if there was something more we could do for the patient, but the patient did not answer. I waited 10 minutes called her back and she still did not answer.  Talked to [facility] and she advised me to [facility] patient back.  On 08/03/2016 at 1350 I called patient back and talked about her pap.  I explained how Paps are done with the 1 broom, or brush and spatula also  how Dr.Pratt has been preforming Paps on patient  for several years with no complaints. The patient still insisted she didn't feel it was thorough and she didn't want the ultrasound appointment. Patient stated she couldn't miss work for the appointment. So I gave the patient the number to call and reschedule. Patient said she would like to speak to me after 3pm because she was still at work.

## 2016-08-04 ENCOUNTER — Telehealth: Payer: Self-pay | Admitting: Family Medicine

## 2016-08-04 NOTE — Telephone Encounter (Signed)
[name] from ultrasound called to inform office that patient stated that she did not wish to have any type of ultrasound and cancelled appointment.

## 2016-08-05 ENCOUNTER — Ambulatory Visit (HOSPITAL_COMMUNITY): Payer: Medicare Other

## 2017-01-12 ENCOUNTER — Emergency Department (HOSPITAL_COMMUNITY): Payer: Medicare Other

## 2017-01-12 ENCOUNTER — Encounter (HOSPITAL_COMMUNITY): Payer: Self-pay

## 2017-01-12 ENCOUNTER — Emergency Department (HOSPITAL_COMMUNITY)
Admission: EM | Admit: 2017-01-12 | Discharge: 2017-01-12 | Disposition: A | Discharge status: Home / Self Care | Payer: Medicare Other | Attending: Emergency Medicine | Admitting: Emergency Medicine

## 2017-01-12 ENCOUNTER — Other Ambulatory Visit: Payer: Self-pay

## 2017-01-12 DIAGNOSIS — I10 Essential (primary) hypertension: Secondary | ICD-10-CM | POA: Diagnosis not present

## 2017-01-12 DIAGNOSIS — R112 Nausea with vomiting, unspecified: Secondary | ICD-10-CM | POA: Diagnosis present

## 2017-01-12 DIAGNOSIS — R51 Headache: Secondary | ICD-10-CM | POA: Diagnosis not present

## 2017-01-12 DIAGNOSIS — Z87891 Personal history of nicotine dependence: Secondary | ICD-10-CM | POA: Diagnosis not present

## 2017-01-12 DIAGNOSIS — R1013 Epigastric pain: Secondary | ICD-10-CM | POA: Insufficient documentation

## 2017-01-12 DIAGNOSIS — R197 Diarrhea, unspecified: Secondary | ICD-10-CM | POA: Diagnosis not present

## 2017-01-12 DIAGNOSIS — Z79899 Other long term (current) drug therapy: Secondary | ICD-10-CM | POA: Insufficient documentation

## 2017-01-12 LAB — GASTROINTESTINAL PANEL BY PCR, STOOL (REPLACES STOOL CULTURE)
ASTROVIRUS: NOT DETECTED
Adenovirus F40/41: NOT DETECTED
CAMPYLOBACTER SPECIES: NOT DETECTED
CRYPTOSPORIDIUM: NOT DETECTED
CYCLOSPORA CAYETANENSIS: NOT DETECTED
Entamoeba histolytica: NOT DETECTED
Enteroaggregative E coli (EAEC): NOT DETECTED
Enteropathogenic E coli (EPEC): NOT DETECTED
Enterotoxigenic E coli (ETEC): NOT DETECTED
Giardia lamblia: NOT DETECTED
Norovirus GI/GII: DETECTED — AB
PLESIMONAS SHIGELLOIDES: NOT DETECTED
Rotavirus A: NOT DETECTED
SAPOVIRUS (I, II, IV, AND V): NOT DETECTED
SHIGA LIKE TOXIN PRODUCING E COLI (STEC): NOT DETECTED
SHIGELLA/ENTEROINVASIVE E COLI (EIEC): NOT DETECTED
Salmonella species: NOT DETECTED
VIBRIO SPECIES: NOT DETECTED
Vibrio cholerae: NOT DETECTED
YERSINIA ENTEROCOLITICA: NOT DETECTED

## 2017-01-12 LAB — COMPREHENSIVE METABOLIC PANEL
ALBUMIN: 4.1 g/dL (ref 3.5–5.0)
ALT: 29 U/L (ref 14–54)
ANION GAP: 9 (ref 5–15)
AST: 25 U/L (ref 15–41)
Alkaline Phosphatase: 48 U/L (ref 38–126)
BUN: 8 mg/dL (ref 6–20)
CHLORIDE: 103 mmol/L (ref 101–111)
CO2: 24 mmol/L (ref 22–32)
Calcium: 9.3 mg/dL (ref 8.9–10.3)
Creatinine, Ser: 0.63 mg/dL (ref 0.44–1.00)
GFR calc Af Amer: >60 mL/min (ref >60)
GFR calc non Af Amer: >60 mL/min (ref >60)
GLUCOSE: 109 mg/dL — AB (ref 65–99)
POTASSIUM: 3.5 mmol/L (ref 3.5–5.1)
SODIUM: 136 mmol/L (ref 135–145)
Total Bilirubin: 1 mg/dL (ref 0.3–1.2)
Total Protein: 7.2 g/dL (ref 6.5–8.1)

## 2017-01-12 LAB — CBC WITH DIFFERENTIAL/PLATELET
Basophils Absolute: 0 10*3/uL (ref 0.0–0.1)
Basophils Relative: 0 %
EOS ABS: 0 10*3/uL (ref 0.0–0.7)
Eosinophils Relative: 0 %
HCT: 39.1 % (ref 36.0–46.0)
Hemoglobin: 13.2 g/dL (ref 12.0–15.0)
Lymphocytes Relative: 12 %
Lymphs Abs: 1.5 10*3/uL (ref 0.7–4.0)
MCH: 29.4 pg (ref 26.0–34.0)
MCHC: 33.8 g/dL (ref 30.0–36.0)
MCV: 87.1 fL (ref 78.0–100.0)
MONO ABS: 0.6 10*3/uL (ref 0.1–1.0)
MONOS PCT: 5 %
NEUTROS PCT: 83 %
Neutro Abs: 9.9 10*3/uL — ABNORMAL HIGH (ref 1.7–7.7)
Platelets: 211 10*3/uL (ref 150–400)
RBC: 4.49 MIL/uL (ref 3.87–5.11)
RDW: 13.5 % (ref 11.5–15.5)
WBC: 12 10*3/uL — ABNORMAL HIGH (ref 4.0–10.5)

## 2017-01-12 LAB — LIPASE, BLOOD: Lipase: 27 U/L (ref 11–51)

## 2017-01-12 LAB — I-STAT TROPONIN, ED: Troponin i, poc: 0 ng/mL (ref 0.00–0.08)

## 2017-01-12 LAB — I-STAT BETA HCG BLOOD, ED (MC, WL, AP ONLY)

## 2017-01-12 LAB — I-STAT CG4 LACTIC ACID, ED
Lactic Acid, Venous: 0.53 mmol/L (ref 0.5–1.9)
Lactic Acid, Venous: 0.74 mmol/L (ref 0.5–1.9)

## 2017-01-12 MED ORDER — METOCLOPRAMIDE HCL 5 MG/ML IJ SOLN
10.0000 mg | Freq: Once | INTRAMUSCULAR | Status: AC
  Administered 2017-01-12: 10 mg via INTRAVENOUS
  Filled 2017-01-12: qty 2

## 2017-01-12 MED ORDER — KETOROLAC TROMETHAMINE 15 MG/ML IJ SOLN
15.0000 mg | Freq: Once | INTRAMUSCULAR | Status: AC
  Administered 2017-01-12: 15 mg via INTRAVENOUS
  Filled 2017-01-12: qty 1

## 2017-01-12 MED ORDER — DICYCLOMINE HCL 20 MG PO TABS: 20.0000 mg | ORAL_TABLET | Freq: Three times a day (TID) | ORAL | 0 refills | Status: DC

## 2017-01-12 MED ORDER — SODIUM CHLORIDE 0.9 % IV BOLUS (SEPSIS)
1000.0000 mL | Freq: Once | INTRAVENOUS | Status: AC
  Administered 2017-01-12: 1000 mL via INTRAVENOUS

## 2017-01-12 MED ORDER — DIPHENOXYLATE-ATROPINE 2.5-0.025 MG PO TABS
2.0000 | ORAL_TABLET | Freq: Once | ORAL | Status: AC
  Administered 2017-01-12: 2 via ORAL
  Filled 2017-01-12: qty 2

## 2017-01-12 MED ORDER — LOPERAMIDE HCL 2 MG PO CAPS: 2.0000 mg | ORAL_CAPSULE | Freq: Four times a day (QID) | ORAL | 0 refills | Status: DC | PRN

## 2017-01-12 MED ORDER — ONDANSETRON 4 MG PO TBDP
4.0000 mg | ORAL_TABLET | Freq: Once | ORAL | Status: AC
Start: 2017-01-12 — End: 2017-01-12
  Administered 2017-01-12: 4 mg via ORAL
  Filled 2017-01-12: qty 1

## 2017-01-12 MED ORDER — GI COCKTAIL ~~LOC~~
30.0000 mL | Freq: Once | ORAL | Status: AC
  Administered 2017-01-12: 30 mL via ORAL
  Filled 2017-01-12: qty 30

## 2017-01-12 MED ORDER — DIPHENHYDRAMINE HCL 50 MG/ML IJ SOLN
25.0000 mg | Freq: Once | INTRAMUSCULAR | Status: AC
  Administered 2017-01-12: 25 mg via INTRAVENOUS
  Filled 2017-01-12: qty 1

## 2017-01-12 MED ORDER — PROMETHAZINE HCL 25 MG PO TABS
25.0000 mg | ORAL_TABLET | Freq: Once | ORAL | Status: AC
  Administered 2017-01-12: 25 mg via ORAL
  Filled 2017-01-12: qty 1

## 2017-01-12 MED ORDER — DICYCLOMINE HCL 10 MG PO CAPS
20.0000 mg | ORAL_CAPSULE | Freq: Once | ORAL | Status: AC
  Administered 2017-01-12: 20 mg via ORAL
  Filled 2017-01-12: qty 2

## 2017-01-12 MED ORDER — ONDANSETRON 4 MG PO TBDP: 4.0000 mg | ORAL_TABLET | Freq: Three times a day (TID) | ORAL | 0 refills | Status: DC | PRN

## 2017-01-12 NOTE — ED Notes (Signed)
Pt ambulated to RR with steady gait, did not want staff assistance. Educated pt on RR call light.

## 2017-01-12 NOTE — ED Provider Notes (Addendum)
[facility] EMERGENCY DEPARTMENT Provider Note   CSN: [id number] Arrival date & time: 01/12/17  3825     History   Chief Complaint Chief Complaint  Patient presents with  . Chest Pain  . Emesis    HPI EDLA PARA is a 41 y.o. female.  HPI   41 year old female with past medical history as below who presents with nausea, vomiting, diarrhea.  The patient states she was in her usual state of health throughout the day yesterday and upon going to bed.  She had beef stir fried for dinner but no one else that ate this is sick.  She awoke at 2 AM with nausea, followed by vomiting and diarrhea.  Following several episodes of vomiting, she developed a burning, epigastric pain that radiates up towards her throat that is worse with lying flat.  She is also had a gradual onset of progressive worsening generalized headache.  She has a history of chronic headaches, but states this is worse in the setting of her vomiting.  She is also had loose bowel movements.  Denies any blood in her bowel movements or vomiting.  Denies any fevers.  No urinary symptoms.  No vaginal bleeding or discharge.  Symptoms seem to be worse when lying flat.  She has not been able to eat or drink at all.  Past Medical History:  Diagnosis Date  . Blind left eye   . Fibroid   . Hypertension   . Vaginal Pap smear, abnormal   . Visual loss, right eye   . Vitamin D deficiency     Patient Active Problem List   Diagnosis Date Noted  . Menorrhagia 07/31/2016    Past Surgical History:  Procedure Laterality Date  . cryotheraphy    . EYE SURGERY      OB History    Gravida Para Term Preterm AB Living   5 3 2 1 2 3    SAB TAB Ectopic Multiple Live Births   1 1     3        Home Medications    Prior to Admission medications   Medication Sig Start Date End Date Taking? Authorizing Provider  albuterol (PROVENTIL HFA;VENTOLIN HFA) 108 (90 BASE) MCG/ACT inhaler Inhale 1-2 puffs into the lungs every  6 (six) hours as needed for wheezing or shortness of breath. 03/21/14   Fransico Meadow, PA-C  atorvastatin (LIPITOR) 20 MG tablet Take 20 mg by mouth daily at 6 PM.     [provider]  Cholecalciferol (VITAMIN D PO) Take 1 tablet by mouth daily.    [provider]  Cyanocobalamin (B-12) 2500 MCG TABS Take 2,500 mcg by mouth daily.    [provider]  dicyclomine (BENTYL) 20 MG tablet Take 1 tablet (20 mg total) by mouth 4 (four) times daily -  before meals and at bedtime for 5 days. 01/12/17 01/17/17  Duffy Bruce, MD  ibuprofen (ADVIL,MOTRIN) 200 MG tablet Take 200 mg by mouth 2 (two) times daily as needed for mild pain.    [provider]  ibuprofen (ADVIL,MOTRIN) 800 MG tablet Take 1 tablet (800 mg total) by mouth 3 (three) times daily. 06/20/16   Keitha Butte, CNM  loperamide (IMODIUM) 2 MG capsule Take 1 capsule (2 mg total) by mouth 4 (four) times daily as needed for diarrhea or loose stools. 01/12/17   Duffy Bruce, MD  megestrol (MEGACE) 40 MG tablet Take 1 tablet (40 mg total) by mouth 3 (three) times  daily. Patient not taking: Reported on 07/31/2016 06/20/16   Keitha Butte, CNM  ondansetron (ZOFRAN ODT) 4 MG disintegrating tablet Take 1 tablet (4 mg total) by mouth every 8 (eight) hours as needed for nausea or vomiting. 01/12/17   Duffy Bruce, MD  PATADAY 0.2 % SOLN Place 1 drop into both eyes daily.  12/30/14   [provider]  valsartan-hydrochlorothiazide (DIOVAN-HCT) 160-25 MG per tablet Take 1 tablet by mouth daily.  03/19/13   [provider]  VITAMIN E PO Take 1 tablet by mouth daily.    [provider]    Family History Family History  Problem Relation Age of Onset  . Hypertension Mother   . Hypertension Father   . Heart failure Father     Social History Social History   Tobacco Use  . Smoking status: Former Smoker    Packs/day: 0.25    Types: Cigarettes  . Smokeless tobacco: Former Dance movement psychotherapist Use Topics  . Alcohol use: No  . Drug use: No     Allergies   Penicillins   Review of Systems Review of Systems  Constitutional: Positive for fatigue.  Cardiovascular: Positive for chest pain.  Gastrointestinal: Positive for abdominal pain, diarrhea, nausea and vomiting.  All other systems reviewed and are negative.    Physical Exam Updated Vital Signs BP 133/89   Pulse 89   Temp 98.3 F (36.8 C) (Oral)   Resp 20   Ht 5\' 3"  (1.6 m)   Wt 67.1 kg (148 lb)   SpO2 100%   BMI 26.22 kg/m   Physical Exam  Constitutional: She is oriented to person, place, and time. She appears well-developed and well-nourished. No distress.  HENT:  Head: Normocephalic and atraumatic.  Eyes: Conjunctivae are normal.  Neck: Neck supple.  Cardiovascular: Normal rate, regular rhythm and normal heart sounds. Exam reveals no friction rub.  No murmur heard. Pulmonary/Chest: Effort normal and breath sounds normal. No respiratory distress. She has no wheezes. She has no rales.  Abdominal: Soft. She exhibits no distension.  Mild epigastric tenderness.  No right upper quadrant tenderness.  Negative Murphy sign.  No lower quadrant tenderness bilaterally.  No guarding.  No rebound.  Musculoskeletal: She exhibits no edema.  Neurological: She is alert and oriented to person, place, and time. She exhibits normal muscle tone.  Skin: Skin is warm. Capillary refill takes less than 2 seconds.  Psychiatric: She has a normal mood and affect.  Nursing note and vitals reviewed.    ED Treatments / Results  Labs (all labs ordered are listed, but only abnormal results are displayed) Labs Reviewed  CBC WITH DIFFERENTIAL/PLATELET - Abnormal; Notable for the following components:      Result Value   WBC 12.0 (*)    Neutro Abs 9.9 (*)    All other components within normal limits  COMPREHENSIVE METABOLIC PANEL - Abnormal; Notable for the following components:   Glucose, Bld 109 (*)    All other  components within normal limits  GASTROINTESTINAL PANEL BY PCR, STOOL (REPLACES STOOL CULTURE)  LIPASE, BLOOD  URINALYSIS, ROUTINE W REFLEX MICROSCOPIC  I-STAT TROPONIN, ED  I-STAT BETA HCG BLOOD, ED ([facility], [facility], [facility] ONLY)  I-STAT CG4 LACTIC ACID, ED  I-STAT CG4 LACTIC ACID, ED    EKG  EKG Interpretation  Date/Time:  Tuesday January 12 2017 07:04:48 EST Ventricular Rate:  87 PR Interval:  144 QRS Duration: 82 QT Interval:  374 QTC Calculation: 450 R Axis:   -  31 Text Interpretation:  Normal sinus rhythm Left axis deviation Abnormal ECG No old tracing to compare No ST elevations Confirmed by Duffy Bruce [phone]) on 01/12/2017 7:20:35 AM Also confirmed by Duffy Bruce [phone]), editor Laurena Spies [phone])  on 01/12/2017 8:34:55 AM       Radiology Dg Abdomen Acute W/chest  Result Date: 01/12/2017 CLINICAL DATA:  Vomiting, diarrhea, chest pain, and dizziness since early this morning. EXAM: DG ABDOMEN ACUTE W/ 1V CHEST COMPARISON:  Chest x-ray of March 21, 2014 FINDINGS: The lungs are adequately inflated and clear. The heart and pulmonary vascularity are normal. The mediastinum is normal in width. Within the abdomen there is a small amount of air and fluid within the right colon. No extraluminal gas collections are observed. No significant gaseous distention of small bowel is observed. The bony structures are unremarkable. There are no abnormal soft tissue calcifications. IMPRESSION: There is no acute cardiopulmonary disease. No acute intra-abdominal abnormality is observed. Electronically Signed   By: David  Martinique M.D.   On: 01/12/2017 08:15    Procedures Procedures (including critical care time)  Medications Ordered in ED Medications  sodium chloride 0.9 % bolus 1,000 mL (1,000 mLs Intravenous New Bag/Given 01/12/17 1022)  promethazine (PHENERGAN) tablet 25 mg (not administered)  metoCLOPramide (REGLAN) injection 10 mg (10 mg Intravenous Given 01/12/17 0827)    diphenhydrAMINE (BENADRYL) injection 25 mg (25 mg Intravenous Given 01/12/17 0826)  sodium chloride 0.9 % bolus 1,000 mL (0 mLs Intravenous Stopped 01/12/17 0921)  gi cocktail (Maalox,Lidocaine,Donnatal) (30 mLs Oral Given 01/12/17 0827)  diphenoxylate-atropine (LOMOTIL) 2.5-0.025 MG per tablet 2 tablet (2 tablets Oral Given 01/12/17 1023)  ketorolac (TORADOL) 15 MG/ML injection 15 mg (15 mg Intravenous Given 01/12/17 1024)  dicyclomine (BENTYL) capsule 20 mg (20 mg Oral Given 01/12/17 1024)     Initial Impression / Assessment and Plan / ED Course  I have reviewed the triage vital signs and the nursing notes.  Pertinent labs & imaging results that were available during my care of the patient were reviewed by me and considered in my medical decision making (see chart for details).     41 year old female here with acute onset of nausea, vomiting, and diarrhea followed by burning epigastric pain.  Suspect viral GI syndrome versus food borne illness.  Her vital signs are reassuring.  Her abdominal exam is benign with no specific tenderness to suggest cholecystitis, appendicitis, or other intra-abdominal emergency.  Screening lab work pending.  EKG is nonischemic and her chest pain is burning, epigastric, and more consistent with likely reflux esophagitis in the setting of her vomiting.  She has normal work of breathing, clear breath sounds, no evidence to suggest esophageal perforation.  Will obtain acute abdominal series, supportive care, and reassess.  7:51 AM Trop neg, doubt ACS with constant sx >5 hours. HCG neg. LA normal, which is reassuring. Will f/u remainder of labs.  8:21 AM Labs reviewed. Pt has mild leukocytosis, no left shift, likely reactive 2/2 vomiting. Her LFTs, renal function are all normal and reassuring. Her abd remains benign. Will f/u after fluids, sx treatment. Doubt intra-abd emergency at this time.  9:01 AM Pt feeling improved but has persistent diarrhea. Repeat exam  shows NO abd TTP. CP is improving with Gi cocktail. Will continue fluids, give lomotil, and obtain GI Pathogen Panel for evaluation. No bloody diarrhea to suggest invasive ETEC/other bacterial gastroenteritis.  10:52 AM Pt remains HDS. She has had no vomiting in the ED and is tolerating PO. VS remain stable.  She is afebrile, abd exam remains benign. She is c/o ongoing diarrhea, but no blood. GIP obtained. She strongly denies any urine sx and is requesting d/c without urine, which is reasonable. Will d/c with outpt follow-up.  ADDENDUM: Pt had one episode of spitting up after sitting upright, ambulating to RR, and being told she was d/c. I gave her a PO zofran and monitored - no further emesis. She has had one episode only. No fever. VS remain stable. HA improved. No focal neuro deficits. She continues to feel comfortable with plan to d/c.   Final Clinical Impressions(s) / ED Diagnoses   Final diagnoses:  Nausea vomiting and diarrhea    ED Discharge Orders        Ordered    dicyclomine (BENTYL) 20 MG tablet  3 times daily before meals & bedtime     01/12/17 1050    loperamide (IMODIUM) 2 MG capsule  4 times daily PRN     01/12/17 1050    ondansetron (ZOFRAN ODT) 4 MG disintegrating tablet  Every 8 hours PRN     01/12/17 1050       Duffy Bruce, MD 01/12/17 1052    Duffy Bruce, MD 01/12/17 1204

## 2017-01-12 NOTE — ED Triage Notes (Signed)
Pt reports waking up at ~0200 with emesis, diarrhea, chest pain, and dizziness. Pt reports >10 episodes of emesis.

## 2017-01-12 NOTE — ED Notes (Signed)
Pt getting dressed in room, heard to be vomiting. Appears to be vomiting, small amount of mucus in emesis bag. Pt came out dressed. Dr. Ellender Hose made aware. Pt wants to go home, given her rx's-can fill them. Taken to d/c in w/c, is ready to go home.

## 2017-01-12 NOTE — ED Notes (Signed)
Pt rpeorts she is unable to obtain a urine sample. MD made aware.

## 2017-01-12 NOTE — ED Notes (Signed)
Patient transported to X-ray 

## 2017-01-12 NOTE — ED Notes (Signed)
Pt given graham crackers and ginger ale ?

## 2017-03-17 ENCOUNTER — Other Ambulatory Visit: Payer: Self-pay | Admitting: Family Medicine

## 2017-03-17 DIAGNOSIS — Z139 Encounter for screening, unspecified: Secondary | ICD-10-CM

## 2017-04-23 ENCOUNTER — Ambulatory Visit
Admission: RE | Admit: 2017-04-23 | Discharge: 2017-04-23 | Disposition: A | Discharge status: Home / Self Care | Payer: Medicare Other | Source: Ambulatory Visit | Attending: Family Medicine | Admitting: Family Medicine

## 2017-04-23 DIAGNOSIS — Z139 Encounter for screening, unspecified: Secondary | ICD-10-CM

## 2018-03-18 ENCOUNTER — Other Ambulatory Visit: Payer: Self-pay | Admitting: Family Medicine

## 2018-03-18 DIAGNOSIS — Z1231 Encounter for screening mammogram for malignant neoplasm of breast: Secondary | ICD-10-CM

## 2018-04-26 ENCOUNTER — Ambulatory Visit: Payer: Medicare Other

## 2018-05-18 IMAGING — CR DG ABDOMEN ACUTE W/ 1V CHEST
3 series · 3 of 3 positions shown · non-contrast
Comparison: Chest x-ray March 21, 2014

CLINICAL DATA: Vomiting, diarrhea, chest pain, and dizziness since
early this morning.

EXAM:
DG ABDOMEN ACUTE W/ 1V CHEST

[chest pa]
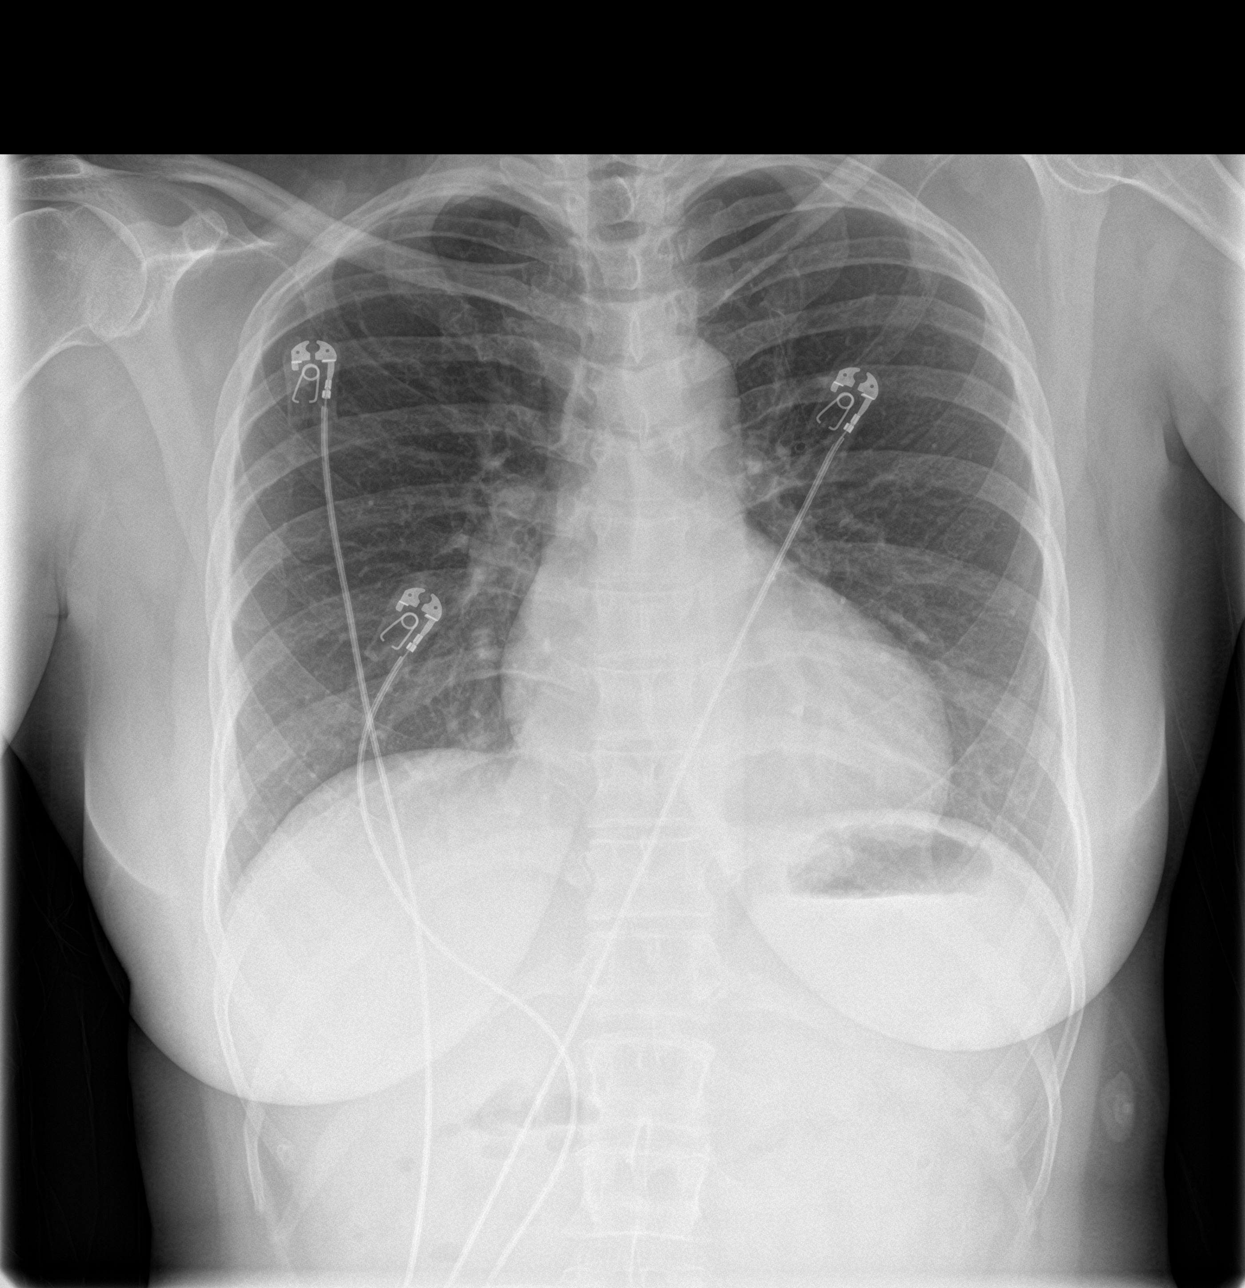

[abdomen erect]
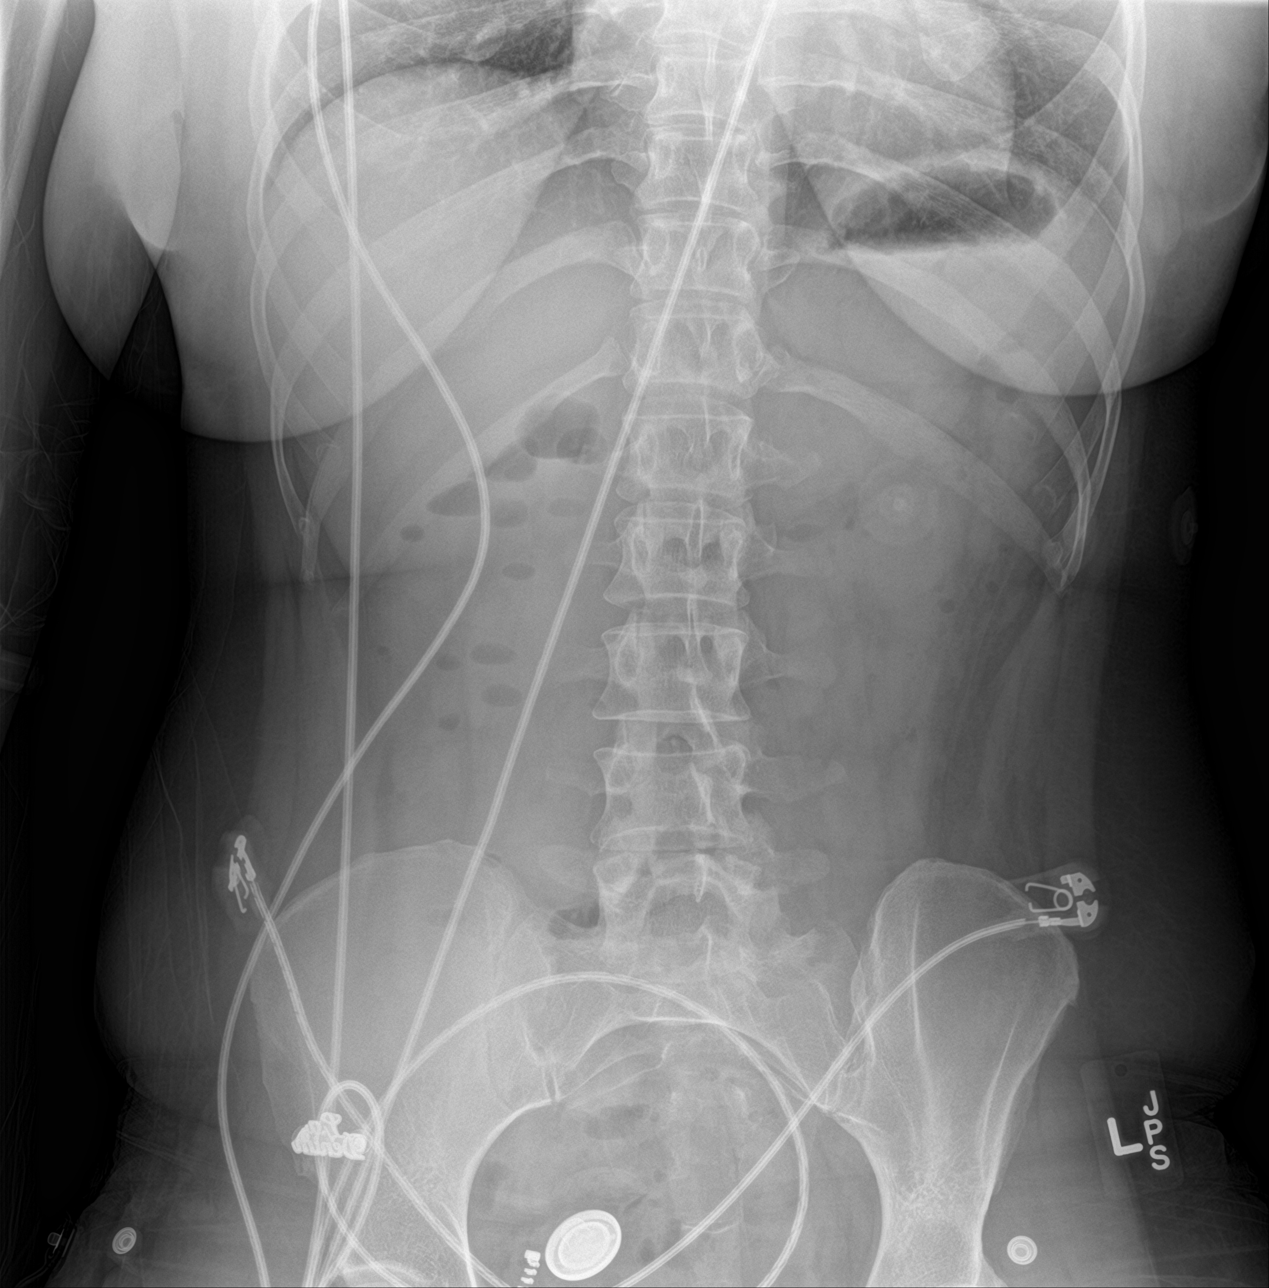

[abdomen supine]
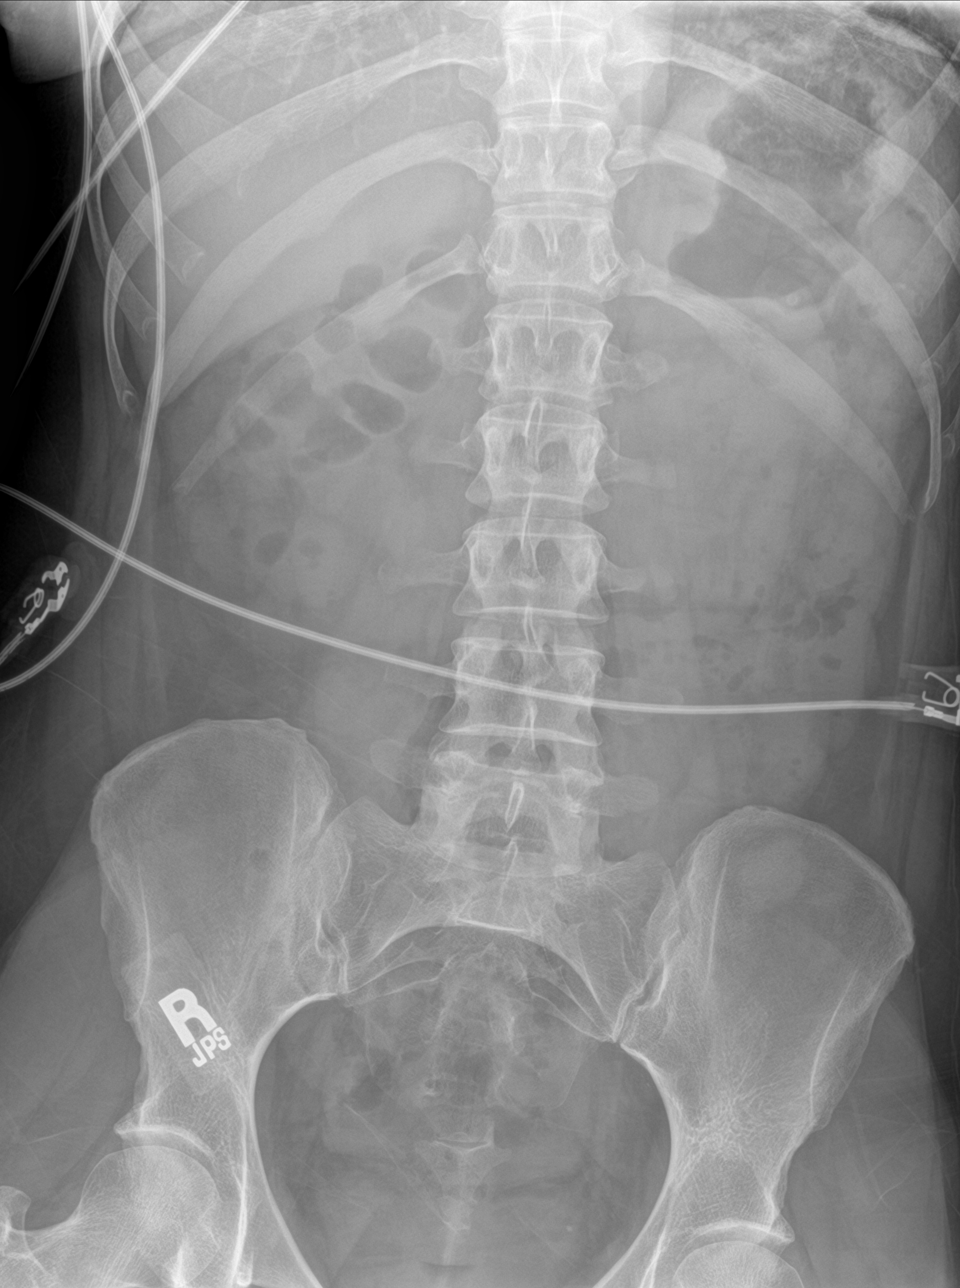

[3 of 3 positions shown; findings below may reference images not displayed]

FINDINGS: The lungs are adequately inflated and clear. The heart and pulmonary
vascularity are normal. The mediastinum is normal in width. Within
the abdomen there is a small amount of air and fluid within the
right colon. No extraluminal gas collections are observed. No
significant gaseous distention of small bowel is observed. The bony
structures are unremarkable. There are no abnormal soft tissue
calcifications.
IMPRESSION: There is no acute cardiopulmonary disease.

No acute intra-abdominal abnormality is observed.

## 2018-08-30 ENCOUNTER — Ambulatory Visit: Payer: Medicare Other

## 2020-10-03 ENCOUNTER — Encounter (HOSPITAL_COMMUNITY): Payer: Self-pay | Admitting: Emergency Medicine

## 2020-10-03 ENCOUNTER — Emergency Department (HOSPITAL_COMMUNITY)
Admission: EM | Admit: 2020-10-03 | Discharge: 2020-10-03 | Disposition: A | Discharge status: Home / Self Care | Payer: Medicare Other | Attending: Emergency Medicine | Admitting: Emergency Medicine

## 2020-10-03 ENCOUNTER — Other Ambulatory Visit: Payer: Self-pay

## 2020-10-03 ENCOUNTER — Emergency Department (HOSPITAL_COMMUNITY): Payer: Medicare Other

## 2020-10-03 ENCOUNTER — Ambulatory Visit (HOSPITAL_COMMUNITY)
Admission: EM | Admit: 2020-10-03 | Discharge: 2020-10-03 | Disposition: A | Discharge status: Home / Self Care | Payer: Medicare Other

## 2020-10-03 DIAGNOSIS — R519 Headache, unspecified: Secondary | ICD-10-CM | POA: Insufficient documentation

## 2020-10-03 DIAGNOSIS — Z5321 Procedure and treatment not carried out due to patient leaving prior to being seen by health care provider: Secondary | ICD-10-CM | POA: Diagnosis not present

## 2020-10-03 DIAGNOSIS — I10 Essential (primary) hypertension: Secondary | ICD-10-CM

## 2020-10-03 LAB — CBC WITH DIFFERENTIAL/PLATELET
Abs Immature Granulocytes: 0.01 10*3/uL (ref 0.00–0.07)
Basophils Absolute: 0 10*3/uL (ref 0.0–0.1)
Basophils Relative: 1 %
Eosinophils Absolute: 0.1 10*3/uL (ref 0.0–0.5)
Eosinophils Relative: 1 %
HCT: 39.3 % (ref 36.0–46.0)
Hemoglobin: 13 g/dL (ref 12.0–15.0)
Immature Granulocytes: 0 %
Lymphocytes Relative: 25 %
Lymphs Abs: 1.5 10*3/uL (ref 0.7–4.0)
MCH: 30.3 pg (ref 26.0–34.0)
MCHC: 33.1 g/dL (ref 30.0–36.0)
MCV: 91.6 fL (ref 80.0–100.0)
Monocytes Absolute: 0.4 10*3/uL (ref 0.1–1.0)
Monocytes Relative: 7 %
Neutro Abs: 4 10*3/uL (ref 1.7–7.7)
Neutrophils Relative %: 66 %
Platelets: 185 10*3/uL (ref 150–400)
RBC: 4.29 MIL/uL (ref 3.87–5.11)
RDW: 13.1 % (ref 11.5–15.5)
WBC: 6 10*3/uL (ref 4.0–10.5)
nRBC: 0 % (ref 0.0–0.2)

## 2020-10-03 LAB — BASIC METABOLIC PANEL
Anion gap: 7 (ref 5–15)
BUN: <5 mg/dL — ABNORMAL LOW (ref 6–20)
CO2: 27 mmol/L (ref 22–32)
Calcium: 9.2 mg/dL (ref 8.9–10.3)
Chloride: 103 mmol/L (ref 98–111)
Creatinine, Ser: 0.63 mg/dL (ref 0.44–1.00)
GFR, Estimated: >60 mL/min (ref >60)
Glucose, Bld: 106 mg/dL — ABNORMAL HIGH (ref 70–99)
Potassium: 3.6 mmol/L (ref 3.5–5.1)
Sodium: 137 mmol/L (ref 135–145)

## 2020-10-03 NOTE — ED Triage Notes (Signed)
C/O high blood pressure; reported has not been taking meds sinc e2020. C/O headaches and eye strain for couple of months.

## 2020-10-03 NOTE — ED Provider Notes (Signed)
Emergency Medicine Provider Triage Evaluation Note  [name] , a 45 y.o. female  was evaluated in triage.  Pt complains of headache ongoing for several months she states.  Has not been on blood pressure medications for approximately the same amount of time she has been prescribed these medications but has stopped taking them she has not seen a PCP she states that she is dealing with grief from recent death of loved ones.  Denies any nausea vomiting diarrhea numbness fevers chills chest pain shortness of breath or changes of urinary abilities.  Review of Systems  Positive: Headache Negative: Fever  Physical Exam  BP (!) 213/112 (BP Location: Left Arm)   Pulse 91   Temp 98.8 F (37.1 C) (Oral)   Resp 14   Ht '5\' 3"'$  (1.6 m)   Wt 59.2 kg   SpO2 100%   BMI 23.12 kg/m  Gen:   Awake, no distress   Resp:  Normal effort  MSK:   Moves extremities without difficulty  Other:   Lungs clear  Alert and oriented to self, place, time and event.   Speech is fluent, clear without dysarthria or dysphasia.   Strength 5/5 in upper/lower extremities   Sensation intact in upper/lower extremities   Normal gait.  CN I not tested  CN II grossly intact visual fields bilaterally. Did not visualize posterior eye.  CN III, IV, VI PERRLA and EOMs intact bilaterally  CN V Intact sensation to sharp and light touch to the face  CN VII facial movements symmetric  CN VIII not tested  CN IX, X no uvula deviation, symmetric rise of soft palate  CN XI 5/5 SCM and trapezius strength bilaterally  CN XII Midline tongue protrusion, symmetric L/R movements    Medical Decision Making  Medically screening exam initiated at 8:51 AM.  Appropriate orders placed.  [name] was informed that the remainder of the evaluation will be completed by another provider, this initial triage assessment does not replace that evaluation, and the importance of remaining in the ED until their evaluation is complete.     Pati Gallo [place], [place] 10/03/20 [id number]    Blanchie Dessert, MD 10/03/20 1318

## 2020-10-03 NOTE — ED Provider Notes (Signed)
[facility]    CSN: [id number] Arrival date & time: 10/03/20  0802      History   Chief Complaint Chief Complaint  Patient presents with   Headache   Hypertension    HPI KARISSA BRUESCH is a 45 y.o. female.   Patient here for elevated blood pressures and headaches that have been ongoing for the past several months.  Patient reports that she has not been seen by primary care provider since 2020 and has not taken anything for her blood pressure.  Patient did check her blood pressure at home yesterday and it was elevated but had not checked it for several months prior to that.  Blood pressure 204/139 in office.  Denies any trauma, injury, or other precipitating event.  Denies any specific alleviating or aggravating factors.  Denies any fevers, chest pain, shortness of breath, N/V/D, numbness, tingling, weakness, abdominal pain.    The history is provided by the patient.  Headache Hypertension Associated symptoms include headaches.   Past Medical History:  Diagnosis Date   Blind left eye    Fibroid    Hypertension    Vaginal Pap smear, abnormal    Visual loss, right eye    Vitamin D deficiency     Patient Active Problem List   Diagnosis Date Noted   Menorrhagia 07/31/2016    Past Surgical History:  Procedure Laterality Date   cryotheraphy     EYE SURGERY      OB History     Gravida  5   Para  3   Term  2   Preterm  1   AB  2   Living  3      SAB  1   IAB  1   Ectopic      Multiple      Live Births  3            Home Medications    Prior to Admission medications   Medication Sig Start Date End Date Taking? Authorizing Provider  albuterol (PROVENTIL HFA;VENTOLIN HFA) 108 (90 BASE) MCG/ACT inhaler Inhale 1-2 puffs into the lungs every 6 (six) hours as needed for wheezing or shortness of breath. 03/21/14   Fransico Meadow, PA-C  atorvastatin (LIPITOR) 20 MG tablet Take 20 mg by mouth daily at 6 PM.     [provider]   Cholecalciferol (VITAMIN D PO) Take 1 tablet by mouth daily.    [provider]  Cyanocobalamin (B-12) 2500 MCG TABS Take 2,500 mcg by mouth daily.    [provider]  dicyclomine (BENTYL) 20 MG tablet Take 1 tablet (20 mg total) by mouth 4 (four) times daily -  before meals and at bedtime for 5 days. 01/12/17 01/17/17  Duffy Bruce, MD  ibuprofen (ADVIL,MOTRIN) 200 MG tablet Take 200 mg by mouth 2 (two) times daily as needed for mild pain.    [provider]  ibuprofen (ADVIL,MOTRIN) 800 MG tablet Take 1 tablet (800 mg total) by mouth 3 (three) times daily. 06/20/16   Keitha Butte, CNM  loperamide (IMODIUM) 2 MG capsule Take 1 capsule (2 mg total) by mouth 4 (four) times daily as needed for diarrhea or loose stools. 01/12/17   Duffy Bruce, MD  megestrol (MEGACE) 40 MG tablet Take 1 tablet (40 mg total) by mouth 3 (three) times daily. Patient not taking: Reported on 07/31/2016 06/20/16   Keitha Butte, CNM  ondansetron (ZOFRAN ODT) 4 MG disintegrating tablet Take 1  tablet (4 mg total) by mouth every 8 (eight) hours as needed for nausea or vomiting. 01/12/17   Duffy Bruce, MD  PATADAY 0.2 % SOLN Place 1 drop into both eyes daily.  12/30/14   [provider]  valsartan-hydrochlorothiazide (DIOVAN-HCT) 160-25 MG per tablet Take 1 tablet by mouth daily.  03/19/13   [provider]  VITAMIN E PO Take 1 tablet by mouth daily.    [provider]    Family History Family History  Problem Relation Age of Onset   Hypertension Mother    Hypertension Father    Heart failure Father     Social History Social History   Tobacco Use   Smoking status: Former    Packs/day: 0.25    Types: Cigarettes   Smokeless tobacco: Former  Substance Use Topics   Alcohol use: No   Drug use: No     Allergies   Penicillins   Review of Systems Review of Systems  Neurological:  Positive for headaches.  All other systems reviewed and are  negative.   Physical Exam Triage Vital Signs ED Triage Vitals  Enc Vitals Group     BP 10/03/20 0820 (!) 214/112     Pulse Rate 10/03/20 0820 88     Resp 10/03/20 0820 17     Temp 10/03/20 0821 98.6 F (37 C)     Temp Source 10/03/20 0821 Oral     SpO2 10/03/20 0820 99 %     Weight --      Height --      Head Circumference --      Peak Flow --      Pain Score 10/03/20 0819 8     Pain Loc --      Pain Edu? --      Excl. in [place]? --    No data found.  Updated Vital Signs BP (!) 204/139 (BP Location: Left Arm)   Pulse 88   Temp 98.6 F (37 C) (Oral)   Resp 17   SpO2 99%   Visual Acuity Right Eye Distance:   Left Eye Distance:   Bilateral Distance:    Right Eye Near:   Left Eye Near:    Bilateral Near:     Physical Exam Vitals and nursing note reviewed.  Constitutional:      General: She is not in acute distress.    Appearance: Normal appearance. She is not ill-appearing, toxic-appearing or diaphoretic.  HENT:     Head: Normocephalic and atraumatic.  Eyes:     Conjunctiva/sclera: Conjunctivae normal.  Cardiovascular:     Rate and Rhythm: Normal rate.     Pulses: Normal pulses.  Pulmonary:     Effort: Pulmonary effort is normal.  Abdominal:     General: Abdomen is flat.  Musculoskeletal:        General: Normal range of motion.     Cervical back: Normal range of motion.  Skin:    General: Skin is warm and dry.  Neurological:     General: No focal deficit present.     Mental Status: She is alert and oriented to person, place, and time.  Psychiatric:        Mood and Affect: Mood normal.     UC Treatments / Results  Labs (all labs ordered are listed, but only abnormal results are displayed) Labs Reviewed - No data to display  EKG   Radiology No results found.  Procedures Procedures (including critical care time)  Medications  Ordered in UC Medications - No data to display  Initial Impression / Assessment and Plan / UC Course  I have  reviewed the triage vital signs and the nursing notes.  Pertinent labs & imaging results that were available during my care of the patient were reviewed by me and considered in my medical decision making (see chart for details).    Due to elevated blood pressures and headache patient was instructed to go to the emergency room.  Patient did have ride and may be transported via private vehicle. Final Clinical Impressions(s) / UC Diagnoses   Final diagnoses:  Hypertension, unspecified type   Discharge Instructions   None    ED Prescriptions   None    PDMP not reviewed this encounter.   Pearson Forster, NP 10/03/20 [phone]

## 2020-10-03 NOTE — ED Notes (Signed)
Pt upset and yelling about waiting in the waiting room since 8am due to BP being high. Pt stated she was leaving.

## 2020-10-03 NOTE — ED Triage Notes (Signed)
Pt reports hasnt seen a PCP since 2020 and hasnt taken any medications for HTn since. Reports headaches have been going on for couple months. Unsure if do to eye strain. Reports her BP was elevated this morning. Adds that was dx with tension headaches.

## 2020-10-03 NOTE — ED Notes (Signed)
Erling Conte NP aware of pt's BP. At bedside now speaking with patient.

## 2020-10-03 NOTE — ED Notes (Signed)
Pt not in the waiting room. Not answering to vital recheck

## 2020-10-03 NOTE — ED Notes (Signed)
Patient is being discharged from the Urgent Care and sent to the Emergency Department via POV . Per Vickki Muff, NP, patient is in need of higher level of care due to Hypertensive crisis and headaches. Patient is aware and verbalizes understanding of plan of care.  Vitals:   10/03/20 0820 10/03/20 0821  BP: (!) 214/112 (!) 204/139  Pulse: 88   Resp: 17   Temp:  98.6 F (37 C)  SpO2: 99%

## 2020-11-01 ENCOUNTER — Other Ambulatory Visit: Payer: Self-pay | Admitting: Family Medicine

## 2020-11-01 DIAGNOSIS — Z1231 Encounter for screening mammogram for malignant neoplasm of breast: Secondary | ICD-10-CM

## 2021-07-16 ENCOUNTER — Ambulatory Visit
Admission: RE | Admit: 2021-07-16 | Discharge: 2021-07-16 | Disposition: A | Discharge status: Home / Self Care | Payer: Medicare Other | Source: Ambulatory Visit | Attending: Family Medicine | Admitting: Family Medicine

## 2021-07-16 DIAGNOSIS — Z1231 Encounter for screening mammogram for malignant neoplasm of breast: Secondary | ICD-10-CM

## 2021-10-02 ENCOUNTER — Emergency Department (HOSPITAL_COMMUNITY): Payer: Medicare Other

## 2021-10-02 ENCOUNTER — Other Ambulatory Visit: Payer: Self-pay

## 2021-10-02 ENCOUNTER — Inpatient Hospital Stay (HOSPITAL_COMMUNITY)
Admission: EM | Admit: 2021-10-02 | Discharge: 2022-01-26 | DRG: 004 | Disposition: E | Payer: Medicare Other | Attending: Pulmonary Disease | Admitting: Pulmonary Disease

## 2021-10-02 ENCOUNTER — Encounter (HOSPITAL_COMMUNITY): Payer: Self-pay | Admitting: *Deleted

## 2021-10-02 DIAGNOSIS — I609 Nontraumatic subarachnoid hemorrhage, unspecified: Secondary | ICD-10-CM

## 2021-10-02 DIAGNOSIS — J988 Other specified respiratory disorders: Secondary | ICD-10-CM | POA: Diagnosis not present

## 2021-10-02 DIAGNOSIS — I468 Cardiac arrest due to other underlying condition: Secondary | ICD-10-CM | POA: Diagnosis present

## 2021-10-02 DIAGNOSIS — J9600 Acute respiratory failure, unspecified whether with hypoxia or hypercapnia: Secondary | ICD-10-CM

## 2021-10-02 DIAGNOSIS — J95851 Ventilator associated pneumonia: Secondary | ICD-10-CM | POA: Diagnosis not present

## 2021-10-02 DIAGNOSIS — Z9189 Other specified personal risk factors, not elsewhere classified: Secondary | ICD-10-CM | POA: Diagnosis not present

## 2021-10-02 DIAGNOSIS — I607 Nontraumatic subarachnoid hemorrhage from unspecified intracranial artery: Principal | ICD-10-CM | POA: Diagnosis present

## 2021-10-02 DIAGNOSIS — E875 Hyperkalemia: Secondary | ICD-10-CM | POA: Diagnosis not present

## 2021-10-02 DIAGNOSIS — E2740 Unspecified adrenocortical insufficiency: Secondary | ICD-10-CM | POA: Diagnosis present

## 2021-10-02 DIAGNOSIS — S01512A Laceration without foreign body of oral cavity, initial encounter: Secondary | ICD-10-CM | POA: Diagnosis present

## 2021-10-02 DIAGNOSIS — Z1152 Encounter for screening for COVID-19: Secondary | ICD-10-CM | POA: Diagnosis not present

## 2021-10-02 DIAGNOSIS — Z66 Do not resuscitate: Secondary | ICD-10-CM | POA: Diagnosis not present

## 2021-10-02 DIAGNOSIS — D638 Anemia in other chronic diseases classified elsewhere: Secondary | ICD-10-CM | POA: Diagnosis present

## 2021-10-02 DIAGNOSIS — Z9911 Dependence on respirator [ventilator] status: Secondary | ICD-10-CM

## 2021-10-02 DIAGNOSIS — S066XAA Traumatic subarachnoid hemorrhage with loss of consciousness status unknown, initial encounter: Secondary | ICD-10-CM | POA: Diagnosis present

## 2021-10-02 DIAGNOSIS — G935 Compression of brain: Secondary | ICD-10-CM | POA: Diagnosis present

## 2021-10-02 DIAGNOSIS — R739 Hyperglycemia, unspecified: Secondary | ICD-10-CM | POA: Diagnosis not present

## 2021-10-02 DIAGNOSIS — I9589 Other hypotension: Secondary | ICD-10-CM | POA: Diagnosis not present

## 2021-10-02 DIAGNOSIS — N179 Acute kidney failure, unspecified: Secondary | ICD-10-CM | POA: Diagnosis present

## 2021-10-02 DIAGNOSIS — E232 Diabetes insipidus: Secondary | ICD-10-CM | POA: Diagnosis not present

## 2021-10-02 DIAGNOSIS — E43 Unspecified severe protein-calorie malnutrition: Secondary | ICD-10-CM | POA: Diagnosis not present

## 2021-10-02 DIAGNOSIS — I471 Supraventricular tachycardia, unspecified: Secondary | ICD-10-CM | POA: Diagnosis present

## 2021-10-02 DIAGNOSIS — A498 Other bacterial infections of unspecified site: Secondary | ICD-10-CM

## 2021-10-02 DIAGNOSIS — E222 Syndrome of inappropriate secretion of antidiuretic hormone: Secondary | ICD-10-CM | POA: Diagnosis not present

## 2021-10-02 DIAGNOSIS — J690 Pneumonitis due to inhalation of food and vomit: Secondary | ICD-10-CM | POA: Diagnosis present

## 2021-10-02 DIAGNOSIS — G936 Cerebral edema: Secondary | ICD-10-CM | POA: Diagnosis present

## 2021-10-02 DIAGNOSIS — T7849XA Other allergy, initial encounter: Secondary | ICD-10-CM | POA: Diagnosis not present

## 2021-10-02 DIAGNOSIS — Z8249 Family history of ischemic heart disease and other diseases of the circulatory system: Secondary | ICD-10-CM

## 2021-10-02 DIAGNOSIS — Z515 Encounter for palliative care: Secondary | ICD-10-CM

## 2021-10-02 DIAGNOSIS — R571 Hypovolemic shock: Secondary | ICD-10-CM | POA: Diagnosis not present

## 2021-10-02 DIAGNOSIS — S066X9A Traumatic subarachnoid hemorrhage with loss of consciousness of unspecified duration, initial encounter: Secondary | ICD-10-CM | POA: Diagnosis not present

## 2021-10-02 DIAGNOSIS — B9689 Other specified bacterial agents as the cause of diseases classified elsewhere: Secondary | ICD-10-CM | POA: Diagnosis not present

## 2021-10-02 DIAGNOSIS — J9611 Chronic respiratory failure with hypoxia: Secondary | ICD-10-CM | POA: Diagnosis not present

## 2021-10-02 DIAGNOSIS — T380X5A Adverse effect of glucocorticoids and synthetic analogues, initial encounter: Secondary | ICD-10-CM | POA: Diagnosis present

## 2021-10-02 DIAGNOSIS — J9610 Chronic respiratory failure, unspecified whether with hypoxia or hypercapnia: Secondary | ICD-10-CM

## 2021-10-02 DIAGNOSIS — N19 Unspecified kidney failure: Secondary | ICD-10-CM

## 2021-10-02 DIAGNOSIS — Z930 Tracheostomy status: Secondary | ICD-10-CM

## 2021-10-02 DIAGNOSIS — R1310 Dysphagia, unspecified: Secondary | ICD-10-CM | POA: Diagnosis present

## 2021-10-02 DIAGNOSIS — E876 Hypokalemia: Secondary | ICD-10-CM | POA: Diagnosis not present

## 2021-10-02 DIAGNOSIS — R6521 Severe sepsis with septic shock: Secondary | ICD-10-CM | POA: Diagnosis not present

## 2021-10-02 DIAGNOSIS — J9601 Acute respiratory failure with hypoxia: Secondary | ICD-10-CM | POA: Diagnosis present

## 2021-10-02 DIAGNOSIS — Z5329 Procedure and treatment not carried out because of patient's decision for other reasons: Secondary | ICD-10-CM | POA: Diagnosis not present

## 2021-10-02 DIAGNOSIS — K148 Other diseases of tongue: Secondary | ICD-10-CM | POA: Diagnosis not present

## 2021-10-02 DIAGNOSIS — D6489 Other specified anemias: Secondary | ICD-10-CM | POA: Diagnosis not present

## 2021-10-02 DIAGNOSIS — A419 Sepsis, unspecified organism: Secondary | ICD-10-CM | POA: Diagnosis not present

## 2021-10-02 DIAGNOSIS — Z7189 Other specified counseling: Secondary | ICD-10-CM | POA: Diagnosis not present

## 2021-10-02 DIAGNOSIS — G9340 Encephalopathy, unspecified: Secondary | ICD-10-CM | POA: Diagnosis not present

## 2021-10-02 DIAGNOSIS — I10 Essential (primary) hypertension: Secondary | ICD-10-CM | POA: Diagnosis present

## 2021-10-02 DIAGNOSIS — E870 Hyperosmolality and hypernatremia: Secondary | ICD-10-CM

## 2021-10-02 DIAGNOSIS — R4182 Altered mental status, unspecified: Secondary | ICD-10-CM

## 2021-10-02 DIAGNOSIS — Z87891 Personal history of nicotine dependence: Secondary | ICD-10-CM

## 2021-10-02 DIAGNOSIS — Z751 Person awaiting admission to adequate facility elsewhere: Secondary | ICD-10-CM

## 2021-10-02 DIAGNOSIS — E162 Hypoglycemia, unspecified: Secondary | ICD-10-CM | POA: Diagnosis not present

## 2021-10-02 DIAGNOSIS — R21 Rash and other nonspecific skin eruption: Secondary | ICD-10-CM | POA: Diagnosis not present

## 2021-10-02 DIAGNOSIS — E860 Dehydration: Secondary | ICD-10-CM | POA: Diagnosis present

## 2021-10-02 DIAGNOSIS — Z79899 Other long term (current) drug therapy: Secondary | ICD-10-CM

## 2021-10-02 DIAGNOSIS — D259 Leiomyoma of uterus, unspecified: Secondary | ICD-10-CM | POA: Diagnosis present

## 2021-10-02 DIAGNOSIS — G9382 Brain death: Secondary | ICD-10-CM | POA: Diagnosis not present

## 2021-10-02 DIAGNOSIS — N3289 Other specified disorders of bladder: Secondary | ICD-10-CM | POA: Diagnosis not present

## 2021-10-02 DIAGNOSIS — R403 Persistent vegetative state: Secondary | ICD-10-CM | POA: Diagnosis present

## 2021-10-02 DIAGNOSIS — Z803 Family history of malignant neoplasm of breast: Secondary | ICD-10-CM

## 2021-10-02 DIAGNOSIS — N1330 Unspecified hydronephrosis: Secondary | ICD-10-CM | POA: Diagnosis not present

## 2021-10-02 DIAGNOSIS — H548 Legal blindness, as defined in USA: Secondary | ICD-10-CM | POA: Diagnosis present

## 2021-10-02 DIAGNOSIS — Z880 Allergy status to penicillin: Secondary | ICD-10-CM

## 2021-10-02 DIAGNOSIS — G931 Anoxic brain damage, not elsewhere classified: Secondary | ICD-10-CM | POA: Diagnosis not present

## 2021-10-02 DIAGNOSIS — R579 Shock, unspecified: Secondary | ICD-10-CM | POA: Diagnosis not present

## 2021-10-02 DIAGNOSIS — Z6826 Body mass index (BMI) 26.0-26.9, adult: Secondary | ICD-10-CM

## 2021-10-02 DIAGNOSIS — J9621 Acute and chronic respiratory failure with hypoxia: Secondary | ICD-10-CM | POA: Insufficient documentation

## 2021-10-02 DIAGNOSIS — D500 Iron deficiency anemia secondary to blood loss (chronic): Secondary | ICD-10-CM | POA: Diagnosis not present

## 2021-10-02 LAB — CBC WITH DIFFERENTIAL/PLATELET
Abs Immature Granulocytes: 0.07 10*3/uL (ref 0.00–0.07)
Basophils Absolute: 0.1 10*3/uL (ref 0.0–0.1)
Basophils Relative: 1 %
Eosinophils Absolute: 0.2 10*3/uL (ref 0.0–0.5)
Eosinophils Relative: 1 %
HCT: 45.1 % (ref 36.0–46.0)
Hemoglobin: 15.3 g/dL — ABNORMAL HIGH (ref 12.0–15.0)
Immature Granulocytes: 1 %
Lymphocytes Relative: 42 %
Lymphs Abs: 6.1 10*3/uL — ABNORMAL HIGH (ref 0.7–4.0)
MCH: 30.5 pg (ref 26.0–34.0)
MCHC: 33.9 g/dL (ref 30.0–36.0)
MCV: 90 fL (ref 80.0–100.0)
Monocytes Absolute: 0.6 10*3/uL (ref 0.1–1.0)
Monocytes Relative: 4 %
Neutro Abs: 7.4 10*3/uL (ref 1.7–7.7)
Neutrophils Relative %: 51 %
Platelets: 189 10*3/uL (ref 150–400)
RBC: 5.01 MIL/uL (ref 3.87–5.11)
RDW: 12.5 % (ref 11.5–15.5)
WBC: 14.5 10*3/uL — ABNORMAL HIGH (ref 4.0–10.5)
nRBC: 0 % (ref 0.0–0.2)

## 2021-10-02 LAB — PROTIME-INR
INR: 1.2 (ref 0.8–1.2)
Prothrombin Time: 15.2 seconds (ref 11.4–15.2)

## 2021-10-02 LAB — I-STAT ARTERIAL BLOOD GAS, ED
Acid-base deficit: 3 mmol/L — ABNORMAL HIGH (ref 0.0–2.0)
Bicarbonate: 21.8 mmol/L (ref 20.0–28.0)
Calcium, Ion: 1.09 mmol/L — ABNORMAL LOW (ref 1.15–1.40)
HCT: 39 % (ref 36.0–46.0)
Hemoglobin: 13.3 g/dL (ref 12.0–15.0)
O2 Saturation: 99 %
Patient temperature: 96.6
Potassium: 2.6 mmol/L — CL (ref 3.5–5.1)
Sodium: 137 mmol/L (ref 135–145)
TCO2: 23 mmol/L (ref 22–32)
pCO2 arterial: 34.1 mmHg (ref 32–48)
pH, Arterial: 7.409 (ref 7.35–7.45)
pO2, Arterial: 158 mmHg — ABNORMAL HIGH (ref 83–108)

## 2021-10-02 LAB — HIV ANTIBODY (ROUTINE TESTING W REFLEX): HIV Screen 4th Generation wRfx: NONREACTIVE

## 2021-10-02 LAB — URINALYSIS, ROUTINE W REFLEX MICROSCOPIC
Bilirubin Urine: NEGATIVE
Glucose, UA: 150 mg/dL — AB
Ketones, ur: NEGATIVE mg/dL
Leukocytes,Ua: NEGATIVE
Nitrite: NEGATIVE
Protein, ur: >=300 mg/dL — AB
RBC / HPF: >50 RBC/hpf — ABNORMAL HIGH (ref 0–5)
Specific Gravity, Urine: 1.018 (ref 1.005–1.030)
pH: 8 (ref 5.0–8.0)

## 2021-10-02 LAB — CK: Total CK: 221 U/L (ref 38–234)

## 2021-10-02 LAB — I-STAT BETA HCG BLOOD, ED (MC, WL, AP ONLY): I-stat hCG, quantitative: <5 m[IU]/mL (ref <5)

## 2021-10-02 LAB — I-STAT CHEM 8, ED
BUN: 8 mg/dL (ref 6–20)
Calcium, Ion: 1.01 mmol/L — ABNORMAL LOW (ref 1.15–1.40)
Chloride: 106 mmol/L (ref 98–111)
Creatinine, Ser: 0.6 mg/dL (ref 0.44–1.00)
Glucose, Bld: 203 mg/dL — ABNORMAL HIGH (ref 70–99)
HCT: 47 % — ABNORMAL HIGH (ref 36.0–46.0)
Hemoglobin: 16 g/dL — ABNORMAL HIGH (ref 12.0–15.0)
Potassium: 3.2 mmol/L — ABNORMAL LOW (ref 3.5–5.1)
Sodium: 139 mmol/L (ref 135–145)
TCO2: 18 mmol/L — ABNORMAL LOW (ref 22–32)

## 2021-10-02 LAB — LACTIC ACID, PLASMA
Lactic Acid, Venous: 3.6 mmol/L (ref 0.5–1.9)
Lactic Acid, Venous: 4.1 mmol/L (ref 0.5–1.9)

## 2021-10-02 LAB — TSH: TSH: 13.542 u[IU]/mL — ABNORMAL HIGH (ref 0.350–4.500)

## 2021-10-02 LAB — COMPREHENSIVE METABOLIC PANEL
ALT: 50 U/L — ABNORMAL HIGH (ref 0–44)
AST: 64 U/L — ABNORMAL HIGH (ref 15–41)
Albumin: 3.6 g/dL (ref 3.5–5.0)
Alkaline Phosphatase: 41 U/L (ref 38–126)
Anion gap: 15 (ref 5–15)
BUN: 9 mg/dL (ref 6–20)
CO2: 17 mmol/L — ABNORMAL LOW (ref 22–32)
Calcium: 8.8 mg/dL — ABNORMAL LOW (ref 8.9–10.3)
Chloride: 103 mmol/L (ref 98–111)
Creatinine, Ser: 0.78 mg/dL (ref 0.44–1.00)
GFR, Estimated: >60 mL/min (ref >60)
Glucose, Bld: 202 mg/dL — ABNORMAL HIGH (ref 70–99)
Potassium: 3.1 mmol/L — ABNORMAL LOW (ref 3.5–5.1)
Sodium: 135 mmol/L (ref 135–145)
Total Bilirubin: 0.9 mg/dL (ref 0.3–1.2)
Total Protein: 6.7 g/dL (ref 6.5–8.1)

## 2021-10-02 LAB — RAPID URINE DRUG SCREEN, HOSP PERFORMED
Amphetamines: NOT DETECTED
Barbiturates: NOT DETECTED
Benzodiazepines: NOT DETECTED
Cocaine: NOT DETECTED
Opiates: NOT DETECTED
Tetrahydrocannabinol: POSITIVE — AB

## 2021-10-02 LAB — LIPASE, BLOOD: Lipase: 41 U/L (ref 11–51)

## 2021-10-02 LAB — OSMOLALITY: Osmolality: 297 mOsm/kg — ABNORMAL HIGH (ref 275–295)

## 2021-10-02 LAB — RESP PANEL BY RT-PCR (FLU A&B, COVID) ARPGX2
Influenza A by PCR: NEGATIVE
Influenza B by PCR: NEGATIVE
SARS Coronavirus 2 by RT PCR: NEGATIVE

## 2021-10-02 LAB — MAGNESIUM: Magnesium: 1.8 mg/dL (ref 1.7–2.4)

## 2021-10-02 LAB — SALICYLATE LEVEL: Salicylate Lvl: <7 mg/dL — ABNORMAL LOW (ref 7.0–30.0)

## 2021-10-02 LAB — BRAIN NATRIURETIC PEPTIDE: B Natriuretic Peptide: 180 pg/mL — ABNORMAL HIGH (ref 0.0–100.0)

## 2021-10-02 LAB — TROPONIN I (HIGH SENSITIVITY)
Troponin I (High Sensitivity): 30 ng/L — ABNORMAL HIGH (ref <18)
Troponin I (High Sensitivity): 480 ng/L (ref <18)

## 2021-10-02 LAB — MRSA NEXT GEN BY PCR, NASAL: MRSA by PCR Next Gen: NOT DETECTED

## 2021-10-02 LAB — CBG MONITORING, ED: Glucose-Capillary: 195 mg/dL — ABNORMAL HIGH (ref 70–99)

## 2021-10-02 MED ORDER — MANNITOL 20 % IV SOLN
100.0000 g | Freq: Once | Status: AC
  Administered 2021-10-02: 100 g via INTRAVENOUS
  Filled 2021-10-02: qty 500

## 2021-10-02 MED ORDER — PANTOPRAZOLE SODIUM 40 MG IV SOLR
40.0000 mg | Freq: Every day | INTRAVENOUS | Status: DC
  Administered 2021-10-02: 40 mg via INTRAVENOUS
  Filled 2021-10-02: qty 10

## 2021-10-02 MED ORDER — ORAL CARE MOUTH RINSE
15.0000 mL | OROMUCOSAL | Status: DC
  Administered 2021-10-02 – 2021-11-05 (×397): 15 mL via OROMUCOSAL

## 2021-10-02 MED ORDER — CHLORHEXIDINE GLUCONATE CLOTH 2 % EX PADS
6.0000 | MEDICATED_PAD | Freq: Every day | CUTANEOUS | Status: DC
  Administered 2021-10-04 – 2021-11-18 (×45): 6 via TOPICAL

## 2021-10-02 MED ORDER — PANTOPRAZOLE SODIUM 40 MG IV SOLR
40.0000 mg | Freq: Two times a day (BID) | INTRAVENOUS | Status: DC
  Administered 2021-10-02: 40 mg via INTRAVENOUS
  Filled 2021-10-02: qty 10

## 2021-10-02 MED ORDER — ACETAMINOPHEN 325 MG PO TABS
650.0000 mg | ORAL_TABLET | ORAL | Status: DC | PRN
  Filled 2021-10-02: qty 2

## 2021-10-02 MED ORDER — SENNOSIDES-DOCUSATE SODIUM 8.6-50 MG PO TABS
1.0000 | ORAL_TABLET | Freq: Two times a day (BID) | ORAL | Status: DC
  Filled 2021-10-02: qty 1

## 2021-10-02 MED ORDER — IOHEXOL 350 MG/ML SOLN
100.0000 mL | Freq: Once | INTRAVENOUS | Status: AC | PRN
  Administered 2021-10-02: 100 mL via INTRAVENOUS

## 2021-10-02 MED ORDER — ETOMIDATE 2 MG/ML IV SOLN
INTRAVENOUS | Status: DC | PRN
  Administered 2021-10-02: 20 mg via INTRAVENOUS

## 2021-10-02 MED ORDER — ACETAMINOPHEN 160 MG/5ML PO SOLN
650.0000 mg | ORAL | Status: DC | PRN
  Administered 2021-10-10 – 2021-11-23 (×16): 650 mg
  Filled 2021-10-02 (×18): qty 20.3

## 2021-10-02 MED ORDER — VASOPRESSIN 20 UNITS/100 ML INFUSION FOR SHOCK: 0.0000 [IU]/min | INTRAVENOUS | Status: DC

## 2021-10-02 MED ORDER — SODIUM CHLORIDE 0.9 % IV BOLUS
1000.0000 mL | Freq: Once | INTRAVENOUS | Status: AC
  Administered 2021-10-02: 1000 mL via INTRAVENOUS

## 2021-10-02 MED ORDER — ORAL CARE MOUTH RINSE: 15.0000 mL | OROMUCOSAL | Status: DC | PRN

## 2021-10-02 MED ORDER — IOHEXOL 350 MG/ML SOLN
50.0000 mL | Freq: Once | INTRAVENOUS | Status: AC | PRN
  Administered 2021-10-02: 50 mL via INTRAVENOUS

## 2021-10-02 MED ORDER — CLEVIDIPINE BUTYRATE 0.5 MG/ML IV EMUL
0.0000 mg/h | INTRAVENOUS | Status: DC
  Administered 2021-10-02: 2 mg/h via INTRAVENOUS
  Administered 2021-10-02: 4 mg/h via INTRAVENOUS
  Filled 2021-10-02 (×5): qty 50

## 2021-10-02 MED ORDER — LACTATED RINGERS IV BOLUS
1000.0000 mL | Freq: Once | INTRAVENOUS | Status: AC
  Administered 2021-10-02: 1000 mL via INTRAVENOUS

## 2021-10-02 MED ORDER — STROKE: EARLY STAGES OF RECOVERY BOOK: Freq: Once | Status: DC

## 2021-10-02 MED ORDER — NIMODIPINE 30 MG PO CAPS
60.0000 mg | ORAL_CAPSULE | ORAL | Status: DC
  Filled 2021-10-02 (×4): qty 2

## 2021-10-02 MED ORDER — ROCURONIUM BROMIDE 50 MG/5ML IV SOLN
INTRAVENOUS | Status: DC | PRN
  Administered 2021-10-02: 80 mg via INTRAVENOUS

## 2021-10-02 MED ORDER — SODIUM CHLORIDE 0.9 % IV SOLN
250.0000 mL | INTRAVENOUS | Status: DC
  Administered 2021-10-04: 250 mL via INTRAVENOUS

## 2021-10-02 MED ORDER — FENTANYL 2500MCG IN NS 250ML (10MCG/ML) PREMIX INFUSION
0.0000 ug/h | INTRAVENOUS | Status: DC
  Administered 2021-10-02: 25 ug/h via INTRAVENOUS
  Filled 2021-10-02: qty 250

## 2021-10-02 MED ORDER — POTASSIUM CHLORIDE 10 MEQ/100ML IV SOLN
10.0000 meq | INTRAVENOUS | Status: AC
  Administered 2021-10-02 (×6): 10 meq via INTRAVENOUS
  Filled 2021-10-02 (×6): qty 100

## 2021-10-02 MED ORDER — PROPOFOL 1000 MG/100ML IV EMUL
5.0000 ug/kg/min | INTRAVENOUS | Status: DC
  Administered 2021-10-02: 5 ug/kg/min via INTRAVENOUS
  Filled 2021-10-02: qty 100

## 2021-10-02 MED ORDER — ACETAMINOPHEN 650 MG RE SUPP: 650.0000 mg | RECTAL | Status: DC | PRN

## 2021-10-02 MED ORDER — MANNITOL 25 % IV SOLN
100.0000 g | Freq: Once | INTRAVENOUS | Status: DC
  Filled 2021-10-02: qty 400

## 2021-10-02 MED ORDER — SODIUM CHLORIDE 0.9 % IV SOLN
3.0000 g | Freq: Four times a day (QID) | INTRAVENOUS | Status: DC
  Administered 2021-10-02 – 2021-10-03 (×4): 3 g via INTRAVENOUS
  Filled 2021-10-02 (×4): qty 8

## 2021-10-02 MED ORDER — NOREPINEPHRINE 4 MG/250ML-% IV SOLN
2.0000 ug/min | INTRAVENOUS | Status: DC
  Administered 2021-10-02: 5 ug/min via INTRAVENOUS
  Administered 2021-10-02: 40 ug/min via INTRAVENOUS
  Administered 2021-10-03: 30 ug/min via INTRAVENOUS
  Administered 2021-10-03: 40 ug/min via INTRAVENOUS
  Administered 2021-10-03: 30 ug/min via INTRAVENOUS
  Administered 2021-10-03: 38 ug/min via INTRAVENOUS
  Administered 2021-10-03: 30 ug/min via INTRAVENOUS
  Administered 2021-10-03: 38 ug/min via INTRAVENOUS
  Administered 2021-10-03: 8 ug/min via INTRAVENOUS
  Administered 2021-10-03: 26 ug/min via INTRAVENOUS
  Administered 2021-10-03: 27 ug/min via INTRAVENOUS
  Filled 2021-10-02 (×2): qty 250
  Filled 2021-10-02: qty 500
  Filled 2021-10-02 (×2): qty 250
  Filled 2021-10-02: qty 500
  Filled 2021-10-02 (×4): qty 250

## 2021-10-02 MED ORDER — LEVETIRACETAM IN NACL 500 MG/100ML IV SOLN
500.0000 mg | Freq: Two times a day (BID) | INTRAVENOUS | Status: DC
  Administered 2021-10-02 – 2021-10-07 (×10): 500 mg via INTRAVENOUS
  Filled 2021-10-02 (×11): qty 100

## 2021-10-02 MED ORDER — NIMODIPINE 6 MG/ML PO SOLN
60.0000 mg | ORAL | Status: DC
  Administered 2021-10-02 – 2021-10-03 (×2): 60 mg
  Filled 2021-10-02 (×5): qty 10

## 2021-10-02 NOTE — ED Notes (Signed)
Patient transported to CT with RN 

## 2021-10-02 NOTE — Progress Notes (Signed)
Pt transported on vent from [facility] to CT and back without any complications.RN at bedside, RT will monitor.

## 2021-10-02 NOTE — Progress Notes (Signed)
PCCM INTERVAL PROGRESS NOTE   Called to bedside to evaluate patient for hypotension. 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 Intubated in ED. Exam indicative of severe brainstem injury in the ED. Neurosurgery has been consulted and informed family of exceedingly poor prognosis. Around 1900 the patient became hypotensive and started on pressors. Quickly escalated to '10mg'$  NE via PIV. I have been asked to evaluate for CVL placement.   Discussed with family. Unfortunately they feel we are not doing everything we can do to help [name]. We discussed the medical support we are providing, such as mechanical ventilation, vasoactive infusions, specialist consultations. I offered CVL for safer administration of vasopressors. Family will not consent at this time.    Georgann Housekeeper, AGACNP-BC [facility]  See Amion for personal pager PCCM on call pager [phone] until 7pm. Please call Elink 7p-7a. [phone]  10/01/2021 11:04 PM

## 2021-10-02 NOTE — Progress Notes (Signed)
eLink Physician-Brief Progress Note Patient Name: [name] DOB: [birth year] MRN: [id number]   Date of Service  10/05/2021  HPI/Events of Note  Hypotension - BP = 92/45 with MAP 58. No central venous line or CVP available. Herniation?  eICU Interventions  Plan" Bolus with 0.9 NaCl 1 liter IV over 1 hour now. Norepinephrine IV infusion via PIV. Titrate to MAP >= 65.      Intervention Category Major Interventions: Hypotension - evaluation and management  Britley Gashi [place] 10/22/2021, 8:24 PM

## 2021-10-02 NOTE — Progress Notes (Signed)
Verbal order from Georgann Housekeeper, NP to run levophed at 58mg through peripheral line as family is declining central line at the time and blood pressure is dropping.  Care ongoing.  SWyn Quaker RN

## 2021-10-02 NOTE — Progress Notes (Signed)
  NEUROSURGERY PROGRESS NOTE   History reviewed. Pt presented obtunded requiring intubation for airway protection. Initial exam by Dr. Ronnald Ramp revealed near cessation of brainstem function with bilateral fixed/dilated pupils, no spontaneous respirations and minimal motor responses.  EXAM:  BP 134/79 (BP Location: Right Arm)   Pulse 80   Temp (!) 96 F (35.6 C)   Resp (!) 37   Ht '5\' 2"'$  (1.575 m)   Wt 56.7 kg   SpO2 100%   BMI 22.86 kg/m   Intubated Pupils fixed/dilated OU No spontaneous respirations Minimal motor responses to central pain  IMAGING: CT head was personally reviewed and demonstrates large volume basal subarachnoid hemorrhage.  No overt hydrocephalus.  CT angiogram of the head and neck was also personally reviewed.  While there is opacification of the cervical internal and external carotid arteries, there is essentially no opacification of the intracranial circulation.  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  Unfortunately, exam is indicative of severe brain/brainstem injury, with CT angiogram suggesting minimal to no perfusion of the intracranial vasculature, again consistent with near brain death.  I therefore do not feel that there is any meaningful chance of neurologic recovery.  I therefore do not see any role for diagnostic angiogram.  PLAN: -We have admitted the patient to the intensive care unit for supportive care -I have discussed the recommendation for changing CODE STATUS to DNR with the family although I think they need a little more time to process our discussion  I did review the imaging findings with the patient's family.  Given her current condition and the CT angiogram findings, I explained to them that I do not feel there is any meaningful chance of recovery.  Our plan at this point would be to continue supportive care and reassess in the morning with the likelihood that there will be no significant change.  We will likely then need to discuss  the options including one-way extubation.   Consuella Lose, MD [facility]

## 2021-10-02 NOTE — Progress Notes (Signed)
Pt transported on vent from [facility] to CT and back without any complications. RN at bedside, RT will monitor.

## 2021-10-02 NOTE — Progress Notes (Addendum)
Visited with patient to provide support. Husband doesn't want chaplain visit.  Chaplain available as needed.   Jaclynn Major, [place], [facility], Pager [phone]

## 2021-10-02 NOTE — Progress Notes (Signed)
MD notified of excessive urinary output. Patient has put out over 2,500 mL of urine since being on 4NICU. This RN did ran urine specific gravity and got 1.011. This information will be passed off to night shift RN.   Normajean Baxter, RN

## 2021-10-02 NOTE — H&P (Signed)
[name] is an 46 y.o. female.   HPI:  46 year old female came into the ED today after being found down at home unresponsive. Family states that she was fine last night and had no symptoms at all. Patient was a GCS 4 upon arrival and was intubated in the ED. Family is at bedside during examination.   Past Medical History:  Diagnosis Date   Blind left eye    Fibroid    Hypertension    Vaginal Pap smear, abnormal    Visual loss, right eye    Vitamin D deficiency     Past Surgical History:  Procedure Laterality Date   cryotheraphy     EYE SURGERY      Allergies  Allergen Reactions   Penicillins Hives    Has patient had a PCN reaction causing immediate rash, facial/tongue/throat swelling, SOB or lightheadedness with hypotension: Unknown Has patient had a PCN reaction causing severe rash involving mucus membranes or skin necrosis: No Has patient had a PCN reaction that required hospitalization: No Has patient had a PCN reaction occurring within the last 10 years: No If all of the above answers are "NO", then may proceed with Cephalosporin use.     Social History   Tobacco Use   Smoking status: Former    Packs/day: 0.25    Types: Cigarettes   Smokeless tobacco: Former  Substance Use Topics   Alcohol use: No    Family History  Problem Relation Age of Onset   Hypertension Mother    Hypertension Father    Heart failure Father    Breast cancer Paternal Aunt      Review of Systems  Positive ROS: intubated and sedated  All other systems have been reviewed and were otherwise negative with the exception of those mentioned in the HPI and as above.  Objective: Vital signs in last 24 hours: Temp:  [95.4 F (35.2 C)-96.5 F (35.8 C)] 95.4 F (35.2 C) (09/07 1100) Pulse Rate:  [72-118] 80 (09/07 1113) Resp:  [6-29] 24 (09/07 1113) BP: (128-239)/(62-156) 132/62 (09/07 1100) SpO2:  [96 %-100 %] 100 % (09/07 1113) FiO2 (%):  [40 %-100 %] 40 % (09/07 1113) Weight:   [56.7 kg] 56.7 kg (09/07 0722)  General Appearance: obtunded Head: Normocephalic, without obvious abnormality, atraumatic Eyes: pupils fixed and dilated      Throat: ett Lungs: respirations unlabored on ventilator Heart: Regular rate and rhythm, S1 and S2 normal, no murmur, rub or gallop Extremities: Extremities normal, atraumatic, no cyanosis or edema Pulses: 2+ and symmetric all extremities Skin: Skin color, texture, turgor normal, no rashes or lesions  NEUROLOGIC:   Mental status: intubated and sedated, obtunded Motor Exam - flicker in feet Sensory Exam - uta Reflexes: symmetric, no pathologic reflexes, No Hoffman's, No clonus Coordination - not tested Gait - uta Balance - uta Cranial Nerves: I: smell Not tested  II: visual acuity  OS: na    OD: na  II: visual fields uta  II: pupils Fixed and dilated  III,VII: ptosis uta  III,IV,VI: extraocular muscles  uta  V: mastication uta  V: facial light touch sensation  uta  V,VII: corneal reflex  normal  VII: facial muscle function - upper  uta  VII: facial muscle function - lower uta  VIII: hearing uta  IX: soft palate elevation  uta  IX,X: gag reflex uta  XI: trapezius strength  uta  XI: sternocleidomastoid strength uta  XI: neck flexion strength  uta  XII: tongue strength  uta    Data Review Lab Results  Component Value Date   WBC 14.5 (H) 10/08/2021   HGB 13.3 09/27/2021   HCT 39.0 10/17/2021   MCV 90.0 10/20/2021   PLT 189 10/25/2021   Lab Results  Component Value Date   NA 137 10/09/2021   K 2.6 (LL) 10/01/2021   CL 106 10/25/2021   CO2 17 (L) 10/01/2021   BUN 8 10/05/2021   CREATININE 0.60 09/29/2021   GLUCOSE 203 (H) 10/08/2021   Lab Results  Component Value Date   INR 1.2 10/14/2021    Radiology: CT ANGIO HEAD NECK W WO CM  Result Date: 10/08/2021 CLINICAL DATA:  Subarachnoid hemorrhage [facility]) EXAM: CT ANGIOGRAPHY HEAD AND NECK TECHNIQUE: Multidetector CT imaging of the head and neck was performed  using the standard protocol during bolus administration of intravenous contrast. Multiplanar CT image reconstructions and MIPs were obtained to evaluate the vascular anatomy. Carotid stenosis measurements (when applicable) are obtained utilizing NASCET criteria, using the distal internal carotid diameter as the denominator. RADIATION DOSE REDUCTION: This exam was performed according to the departmental dose-optimization program which includes automated exposure control, adjustment of the mA and/or kV according to patient size and/or use of iterative reconstruction technique. CONTRAST:  25m OMNIPAQUE IOHEXOL 350 MG/ML SOLN COMPARISON:  None Available. FINDINGS: CTA NECK FINDINGS Aortic arch: Great vessel origins are patent without significant stenosis. 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 Right carotid system: No evidence of dissection, stenosis (50% or greater), or occlusion. Poor contrast opacification at the skull base, which is gradual. Left carotid system: No evidence of dissection, stenosis (50% or greater), or occlusion. Poor contrast opacification at the skull base, which is gradual. Vertebral arteries: Left dominant. No evidence of dissection, stenosis (50% or greater), or occlusion. Skeleton: No acute findings. Other neck: No acute findings. Upper chest: Dependent opacities in the visualized left upper lobe. Review of the MIP images confirms the above findings CTA HEAD FINDINGS Nondiagnostic evaluation intracranially due to poor contrast opacification of the intracranial arteries superimposed on subarachnoid hemorrhage. Review of the MIP images confirms the above findings IMPRESSION: 1. Nondiagnostic evaluation intracranially due to poor contrast opacification of the intracranial arteries superimposed on subarachnoid hemorrhage. The poor contrast opacification is probably due to increased intracranial pressures given gradual decrease in opacification in  the upper neck/skull base of all vessels and intracranial findings on same day CT head. A catheter arteriogram may be more useful for further evaluation/management of suspected ruptured aneurysm if clinically warranted. 2. No occlusion or significant stenosis in the neck. 3. Dependent opacities in the visualized left upper lobe, possibly aspiration. Electronically Signed   By: FMargaretha SheffieldM.D.   On: 10/13/2021 09:57   CT Angio Chest PE W and/or Wo Contrast  Result Date: 10/21/2021 CLINICAL DATA:  Acute chest and abdominal pain. EXAM: CT ANGIOGRAPHY CHEST CT ABDOMEN AND PELVIS WITH CONTRAST TECHNIQUE: Multidetector CT imaging of the chest was performed using the standard protocol during bolus administration of intravenous contrast. Multiplanar CT image reconstructions and MIPs were obtained to evaluate the vascular anatomy. Multidetector CT imaging of the abdomen and pelvis was performed using the standard protocol during bolus administration of intravenous contrast. RADIATION DOSE REDUCTION: This exam was performed according to the departmental dose-optimization program which includes automated exposure control, adjustment of the mA and/or kV according to patient size and/or use of iterative reconstruction technique. CONTRAST:  1034mOMNIPAQUE IOHEXOL 350 MG/ML SOLN COMPARISON:  None Available. FINDINGS: CTA CHEST FINDINGS Cardiovascular: The heart is borderline enlarged for age. This is mainly due to enlarged left atrium and left ventricle. The aorta is normal in caliber. No atherosclerotic calcifications. The pulmonary arterial tree is well opacified. No filling defects to suggest pulmonary embolism. Mediastinum/Nodes: No mediastinal or hilar mass or lymphadenopathy. The endotracheal tube is in good position at the mid tracheal level. There is an NG tube coursing down the esophagus and into the stomach. Lungs/Pleura: Bilateral lower lobe pulmonary infiltrates could suggest aspiration. No pulmonary  lesions, pulmonary edema or pneumothorax. No pleural effusions. Musculoskeletal: The bony thorax is intact. Review of the MIP images confirms the above findings. CT ABDOMEN and PELVIS FINDINGS Hepatobiliary: Low-attenuation lesion at the left hepatic dome is most likely a benign cyst. There are 2 small peripheral enhancing nodules in the right hepatic lobe one is located near the IVC other is near the upper pole region of the right kidney. These both measure approximately 11 mm. The could be small flash filling hemangiomas or small vascular shunts. I would recommend a follow-up MRI examination without and with contrast in 6 months to document stability. Suspect mild diffuse fatty infiltration of the liver with fatty sparing around the gallbladder fossa. The portal and hepatic veins are patent. The gallbladder is grossly normal. No common bile duct dilatation. Pancreas: No mass, inflammation or ductal dilatation. Spleen: Normal size. No focal lesions. Adrenals/Urinary Tract: Adrenal glands and kidneys are unremarkable. No renal lesions or hydronephrosis. The bladder is mildly distended. No bladder mass or calculi. Stomach/Bowel: The stomach contains an NG tube. The duodenum, small bowel and colon are unremarkable. The terminal ileum and appendix are normal. Vascular/Lymphatic: The aorta is normal in caliber. No dissection. The branch vessels are patent. The major venous structures are patent. No mesenteric or retroperitoneal mass or adenopathy. Small scattered lymph nodes are noted. Reproductive: The uterus and ovaries are unremarkable. Other: No pelvic mass or adenopathy. No free pelvic fluid collections. No inguinal mass or adenopathy. No abdominal wall hernia or subcutaneous lesions. Musculoskeletal: No significant bony findings. Review of the MIP images confirms the above findings. IMPRESSION: 1. Left-sided cardiac enlargement. 2. No CT findings for pulmonary embolism. 3. Bilateral lower lobe pulmonary  infiltrates could suggest aspiration pneumonia. 4. No acute abdominal/pelvic findings. 5. Suspect mild diffuse fatty infiltration of the liver with fatty sparing around the gallbladder fossa. 6. Two small peripheral enhancing hepatic lesions as detailed above. These could be flash filling hemangiomas or vascular shunts. Recommend follow-up MR examination in 6 months. 7. Mild bladder distention. Electronically Signed   By: Marijo Sanes M.D.   On: 10/13/2021 08:08   CT ABDOMEN PELVIS W CONTRAST  Result Date: 10/09/2021 CLINICAL DATA:  Acute chest and abdominal pain. EXAM: CT ANGIOGRAPHY CHEST CT ABDOMEN AND PELVIS WITH CONTRAST TECHNIQUE: Multidetector CT imaging of the chest was performed using the standard protocol during bolus administration of intravenous contrast. Multiplanar CT image reconstructions and MIPs were obtained to evaluate the vascular anatomy. Multidetector CT imaging of the abdomen and pelvis was performed using the standard protocol during bolus administration of intravenous contrast. RADIATION DOSE REDUCTION: This exam was performed according to the departmental dose-optimization program which includes automated exposure control, adjustment of the mA and/or kV according to patient size and/or use of iterative reconstruction technique. CONTRAST:  164m OMNIPAQUE IOHEXOL 350 MG/ML SOLN COMPARISON:  None Available. FINDINGS: CTA CHEST FINDINGS Cardiovascular: The heart is borderline enlarged for age. This is mainly due to enlarged left atrium  and left ventricle. The aorta is normal in caliber. No atherosclerotic calcifications. The pulmonary arterial tree is well opacified. No filling defects to suggest pulmonary embolism. Mediastinum/Nodes: No mediastinal or hilar mass or lymphadenopathy. The endotracheal tube is in good position at the mid tracheal level. There is an NG tube coursing down the esophagus and into the stomach. Lungs/Pleura: Bilateral lower lobe pulmonary infiltrates could suggest  aspiration. No pulmonary lesions, pulmonary edema or pneumothorax. No pleural effusions. Musculoskeletal: The bony thorax is intact. Review of the MIP images confirms the above findings. CT ABDOMEN and PELVIS FINDINGS Hepatobiliary: Low-attenuation lesion at the left hepatic dome is most likely a benign cyst. There are 2 small peripheral enhancing nodules in the right hepatic lobe one is located near the IVC other is near the upper pole region of the right kidney. These both measure approximately 11 mm. The could be small flash filling hemangiomas or small vascular shunts. I would recommend a follow-up MRI examination without and with contrast in 6 months to document stability. Suspect mild diffuse fatty infiltration of the liver with fatty sparing around the gallbladder fossa. The portal and hepatic veins are patent. The gallbladder is grossly normal. No common bile duct dilatation. Pancreas: No mass, inflammation or ductal dilatation. Spleen: Normal size. No focal lesions. Adrenals/Urinary Tract: Adrenal glands and kidneys are unremarkable. No renal lesions or hydronephrosis. The bladder is mildly distended. No bladder mass or calculi. Stomach/Bowel: The stomach contains an NG tube. The duodenum, small bowel and colon are unremarkable. The terminal ileum and appendix are normal. Vascular/Lymphatic: The aorta is normal in caliber. No dissection. The branch vessels are patent. The major venous structures are patent. No mesenteric or retroperitoneal mass or adenopathy. Small scattered lymph nodes are noted. Reproductive: The uterus and ovaries are unremarkable. Other: No pelvic mass or adenopathy. No free pelvic fluid collections. No inguinal mass or adenopathy. No abdominal wall hernia or subcutaneous lesions. Musculoskeletal: No significant bony findings. Review of the MIP images confirms the above findings. IMPRESSION: 1. Left-sided cardiac enlargement. 2. No CT findings for pulmonary embolism. 3. Bilateral lower  lobe pulmonary infiltrates could suggest aspiration pneumonia. 4. No acute abdominal/pelvic findings. 5. Suspect mild diffuse fatty infiltration of the liver with fatty sparing around the gallbladder fossa. 6. Two small peripheral enhancing hepatic lesions as detailed above. These could be flash filling hemangiomas or vascular shunts. Recommend follow-up MR examination in 6 months. 7. Mild bladder distention. Electronically Signed   By: Marijo Sanes M.D.   On: 09/28/2021 08:08   CT Head Wo Contrast  Result Date: 10/05/2021 CLINICAL DATA:  Neck trauma, intoxicated or obtunded. Altered mental status. EXAM: CT HEAD WITHOUT CONTRAST CT CERVICAL SPINE WITHOUT CONTRAST TECHNIQUE: Multidetector CT imaging of the head and cervical spine was performed following the standard protocol without intravenous contrast. Multiplanar CT image reconstructions of the cervical spine were also generated. RADIATION DOSE REDUCTION: This exam was performed according to the departmental dose-optimization program which includes automated exposure control, adjustment of the mA and/or kV according to patient size and/or use of iterative reconstruction technique. COMPARISON:  10/03/2020 FINDINGS: CT HEAD FINDINGS Brain: Large volume of subarachnoid hemorrhage in the basal cisterns reaching the cerebral convexities and refluxing into the fourth ventricle. No hydrocephalus. Effacement of subarachnoid spaces; no shift or herniation. No masslike finding or convincing cortical infarct. Vascular: Vessels are largely obscured. Skull: No signs of trauma Sinuses/Orbits: Probable extension of subarachnoid blood into the optic nerve sheaths bilaterally. Critical Value/emergent results were called by telephone at  the time of interpretation on 09/30/2021 at 8:03 am to Dr Gustavus Messing , who is already aware. CT CERVICAL SPINE FINDINGS Alignment: No traumatic malalignment Skull base and vertebrae: No acute fracture Soft tissues and spinal canal: Small volume  extension of subarachnoid blood into the upper canal. Unremarkable appearance of endotracheal and orogastric tubes in the neck. Disc levels:  No significant degenerative finding. Upper chest: Mild dependent atelectasis and interlobular septal thickening, there is pending chest CT. 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 Intraventricular reflux without hydrocephalus. Negative cervical spine CT. Electronically Signed   By: Jorje Guild M.D.   On: 10/07/2021 08:06   CT Cervical Spine Wo Contrast  Result Date: 10/14/2021 CLINICAL DATA:  Neck trauma, intoxicated or obtunded. Altered mental status. EXAM: CT HEAD WITHOUT CONTRAST CT CERVICAL SPINE WITHOUT CONTRAST TECHNIQUE: Multidetector CT imaging of the head and cervical spine was performed following the standard protocol without intravenous contrast. Multiplanar CT image reconstructions of the cervical spine were also generated. RADIATION DOSE REDUCTION: This exam was performed according to the departmental dose-optimization program which includes automated exposure control, adjustment of the mA and/or kV according to patient size and/or use of iterative reconstruction technique. COMPARISON:  10/03/2020 FINDINGS: CT HEAD FINDINGS Brain: Large volume of subarachnoid hemorrhage in the basal cisterns reaching the cerebral convexities and refluxing into the fourth ventricle. No hydrocephalus. Effacement of subarachnoid spaces; no shift or herniation. No masslike finding or convincing cortical infarct. Vascular: Vessels are largely obscured. Skull: No signs of trauma Sinuses/Orbits: Probable extension of subarachnoid blood into the optic nerve sheaths bilaterally. Critical Value/emergent results were called by telephone at the time of interpretation on 10/12/2021 at 8:03 am to Dr Gustavus Messing , who is already aware. CT CERVICAL SPINE FINDINGS Alignment: No traumatic malalignment Skull base and vertebrae: No acute fracture Soft tissues  and spinal canal: Small volume extension of subarachnoid blood into the upper canal. Unremarkable appearance of endotracheal and orogastric tubes in the neck. Disc levels:  No significant degenerative finding. Upper chest: Mild dependent atelectasis and interlobular septal thickening, there is pending chest CT. 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 Intraventricular reflux without hydrocephalus. Negative cervical spine CT. Electronically Signed   By: Jorje Guild M.D.   On: 09/30/2021 08:06   DG Chest Portable 1 View  Result Date: 10/01/2021 CLINICAL DATA:  Ventilator dependent respiratory failure. EXAM: PORTABLE CHEST 1 VIEW COMPARISON:  PA Lat 01/12/2017. FINDINGS: 7:04 a.m. ETT tip is 2.3 cm from the carina. NGT passes well into the stomach but neither the proximal side-hole or tip of the tube are filmed. Mild cardiomegaly is seen with mild perihilar vascular prominence and basilar interstitial edema. No significant pleural effusion is seen. There is patchy opacity in the retrocardiac left lower lobe concerning for pneumonia or aspiration. The lungs are otherwise clear of focal opacities. The thoracic cage is intact. IMPRESSION: Cardiomegaly with perihilar vascular prominence and mild basilar interstitial edema. Increased opacity left lower lobe concerning for pneumonia or aspiration. No significant pleural collections. Support tubes as above. Electronically Signed   By: Telford Nab M.D.   On: 09/28/2021 07:21     Assessment/Plan: 46 year old female presented to the ED after being found unresponsive by family. CT head shows a large volume SAH likely from aneurysm so CTA was ordered. CTA was nondiagnostic. We have discussed the plan of care with the patient's family. Right now there is no further intervention that will be done acutely. This is a devastating  injury and prognosis is poor. Dr. Kathyrn Sheriff will take over this patient's care.    Ocie Cornfield  Guinevere Stephenson 10/08/2021 11:48 AM

## 2021-10-02 NOTE — ED Notes (Signed)
Neuro surgery at bedside.

## 2021-10-02 NOTE — ED Triage Notes (Signed)
Patient presents to ed via [facility] from home  ems states husband states he heard heavy breathing went into fine patient unresp in the bed, called 911 pulled patient in the floor and started CPR , upon ems arrival patient had heart rate 160 was syn shocked and rate went to 70 , patient was being bagged with bilater NPA in. Patient has a gag reflex. Pupils 3 bilaterally and reactive.

## 2021-10-02 NOTE — ED Notes (Signed)
Patient transported to CT with RN and RT 

## 2021-10-02 NOTE — ED Provider Notes (Signed)
8:11 AM Care assumed from Dr. Pearline Cables.  At time of transfer of care, patient is awaiting for consultants to get involved with the patient's care and to be admitted.  Patient was found to have a subarachnoid hemorrhage that pattern appears to be aneurysmal in nature.  Due to elevated blood pressures on arrival, patient has been started on Cleviprex.  Patient was intubated on arrival for airway protection given low GCS.  Critical care has been called, spoke to interventional radiology who is going to review the case and will also speak with neurosurgery.  9:22 AM Critical care came to see the patient and they also feel that patient needs to be seen and admitted by neurosurgery.  Spoke to neurosurgery who will come see the patient.  Blood pressure continues to improve on the Cleviprex.  Spoke to both Dr. Ronnald Ramp and Dr. Kathyrn Sheriff who are both going to come see the patient.  Critical care was kind of to put in a bed request for admission to continue patient's care.  She remains on Cleviprex with improved blood pressures.  Awaiting admission and discussion with specialist and family for further management.  2:27 PM Patient has been admitted and is awaiting her ready bed.  It now appears to show that she has a ready bed and will leave the emergency department with family at the bedside.   Clinical Impression: 1. Altered mental status, unspecified altered mental status type   2. SAH (subarachnoid hemorrhage) ([place])     Disposition: Admit  This note was prepared with assistance of Systems analyst. Occasional wrong-word or sound-a-like substitutions may have occurred due to the inherent limitations of voice recognition software.   CRITICAL CARE Performed by: Gwenyth Allegra Jurline Folger Total critical care time: 35 minutes Critical care time was exclusive of separately billable procedures and treating other patients. Critical care was necessary to treat or prevent imminent or  life-threatening deterioration. Critical care was time spent personally by me on the following activities: development of treatment plan with patient and/or surrogate as well as nursing, discussions with consultants, evaluation of patient's response to treatment, examination of patient, obtaining history from patient or surrogate, ordering and performing treatments and interventions, ordering and review of laboratory studies, ordering and review of radiographic studies, pulse oximetry and re-evaluation of patient's condition.       Kelena Garrow, Gwenyth Allegra, MD 10/08/2021 [phone]

## 2021-10-02 NOTE — ED Notes (Signed)
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

## 2021-10-02 NOTE — Progress Notes (Signed)
Patient ID: [name], female   DOB: 28-Nov-1975, 46 y.o.   MRN: [id number] We were called about this patient, who has a GCS of 4 was found to have diffuse thick subarachnoid hemorrhage throughout the basal cisterns without hydrocephalus.  This is a grade 5 subarachnoid hemorrhage.  Mortality rate is going to be high unfortunately.  I have reached out to Dr. Kathyrn Sheriff, our neurovascular surgeon who is on vascular call,and he has recommended a CTA and he will see the patient.  At this point there is likely not much to do acutely from a surgical or endovascular standpoint.

## 2021-10-02 NOTE — ED Provider Notes (Signed)
[facility] EMERGENCY DEPARTMENT Provider Note   CSN: [id number] Arrival date & time: 09/29/2021  1696     History  Chief Complaint  Patient presents with   Altered Mental Status    [name] is a 46 y.o. female.  Patient as above with significant medical history as below, including hypertension, fibroid who presents to the ED with complaint of unresponsive.  Per husband he went to go the bathroom, when he returned back to the bed patient was "breathing funny" and making gurgling noises.  He attempted to rouse her and she was unresponsive.  He initiated CPR and called EMS.  Upon EMS arrival patient was believed to be in SVT heart rate in the 160s.  They performed DCCV, HR did improve; ectopic atrial tachycardia noted on rhythm strip but sig artifact. No improvement to her mental status. Spouse reports she was in normal state of health last night prior to going to bed. No etoh use in recent months, no illicit drug use, no hx seizure. No hx similar s/s in the past.      Past Medical History:  Diagnosis Date   Blind left eye    Fibroid    Hypertension    Vaginal Pap smear, abnormal    Visual loss, right eye    Vitamin D deficiency     Past Surgical History:  Procedure Laterality Date   cryotheraphy     EYE SURGERY       The history is provided by the EMS personnel and a relative. No language interpreter was used.  Altered Mental Status      Home Medications Prior to Admission medications   Medication Sig Start Date End Date Taking? Authorizing Provider  albuterol (PROVENTIL HFA;VENTOLIN HFA) 108 (90 BASE) MCG/ACT inhaler Inhale 1-2 puffs into the lungs every 6 (six) hours as needed for wheezing or shortness of breath. 03/21/14   Fransico Meadow, PA-C  atorvastatin (LIPITOR) 20 MG tablet Take 20 mg by mouth daily at 6 PM.     [provider]  Cholecalciferol (VITAMIN D PO) Take 1 tablet by mouth daily.    [provider]   Cyanocobalamin (B-12) 2500 MCG TABS Take 2,500 mcg by mouth daily.    [provider]  dicyclomine (BENTYL) 20 MG tablet Take 1 tablet (20 mg total) by mouth 4 (four) times daily -  before meals and at bedtime for 5 days. 01/12/17 01/17/17  Duffy Bruce, MD  ibuprofen (ADVIL,MOTRIN) 200 MG tablet Take 200 mg by mouth 2 (two) times daily as needed for mild pain.    [provider]  ibuprofen (ADVIL,MOTRIN) 800 MG tablet Take 1 tablet (800 mg total) by mouth 3 (three) times daily. 06/20/16   Keitha Butte, CNM  loperamide (IMODIUM) 2 MG capsule Take 1 capsule (2 mg total) by mouth 4 (four) times daily as needed for diarrhea or loose stools. 01/12/17   Duffy Bruce, MD  megestrol (MEGACE) 40 MG tablet Take 1 tablet (40 mg total) by mouth 3 (three) times daily. Patient not taking: Reported on 07/31/2016 06/20/16   Keitha Butte, CNM  ondansetron (ZOFRAN ODT) 4 MG disintegrating tablet Take 1 tablet (4 mg total) by mouth every 8 (eight) hours as needed for nausea or vomiting. 01/12/17   Duffy Bruce, MD  PATADAY 0.2 % SOLN Place 1 drop into both eyes daily.  12/30/14   [provider]  valsartan-hydrochlorothiazide (DIOVAN-HCT) 160-25 MG per tablet Take 1 tablet by mouth  daily.  03/19/13   [provider]  VITAMIN E PO Take 1 tablet by mouth daily.    [provider]      Allergies    Penicillins    Review of Systems   Review of Systems  Unable to perform ROS: Patient unresponsive    Physical Exam Updated Vital Signs BP (!) 207/110   Pulse 81   Temp (!) 96.5 F (35.8 C) (Temporal)   Resp 15   Ht '5\' 2"'$  (1.575 m)   Wt 56.7 kg   SpO2 100%   BMI 22.86 kg/m  Physical Exam Vitals and nursing note reviewed. Exam conducted with a chaperone present.  Constitutional:      General: She is in acute distress.     Appearance: She is normal weight. She is ill-appearing.     Comments: Gurgling, frothing secretions from mouth  HENT:     Head:  Normocephalic and atraumatic.     Comments: Teeth clamped, small laceration to anterior aspect of tongue     Right Ear: External ear normal.     Left Ear: External ear normal.     Nose:     Comments: NPA b/l Eyes:     General: No scleral icterus.    Pupils: Pupils are equal, round, and reactive to light.     Comments: Pupils 37m briskly reactive b/l  Neck:     Trachea: Trachea normal. No tracheal deviation.  Cardiovascular:     Rate and Rhythm: Regular rhythm. Tachycardia present.     Pulses: Normal pulses.          Radial pulses are 2+ on the right side and 2+ on the left side.       Dorsalis pedis pulses are 2+ on the right side and 2+ on the left side.     Heart sounds: No murmur heard. Pulmonary:     Effort: No retractions.     Breath sounds: Decreased air movement and transmitted upper airway sounds present. No stridor.  Abdominal:     General: There is no distension.     Palpations: Abdomen is soft.     Tenderness: There is no abdominal tenderness.     Comments: Pelvis stable to ap pressure   Musculoskeletal:     Cervical back: No erythema or rigidity.     Right lower leg: No edema.     Left lower leg: No edema.  Skin:    General: Skin is warm and dry.     Capillary Refill: Capillary refill takes less than 2 seconds.  Neurological:     Mental Status: She is unresponsive.     GCS: GCS eye subscore is 2. GCS verbal subscore is 1. GCS motor subscore is 1.     Cranial Nerves: No facial asymmetry.     Comments: No posturing No response to noxious stimulus to extremities but will open eyes intermittently to sternal rub No clonus No purposeful movements      ED Results / Procedures / Treatments   Labs (all labs ordered are listed, but only abnormal results are displayed) Labs Reviewed  CBC WITH DIFFERENTIAL/PLATELET - Abnormal; Notable for the following components:      Result Value   WBC 14.5 (*)    Hemoglobin 15.3 (*)    All other components within normal limits   COMPREHENSIVE METABOLIC PANEL - Abnormal; Notable for the following components:   Potassium 3.1 (*)    CO2 17 (*)    Glucose, Bld  202 (*)    Calcium 8.8 (*)    AST 64 (*)    ALT 50 (*)    All other components within normal limits  CBG MONITORING, ED - Abnormal; Notable for the following components:   Glucose-Capillary 195 (*)    All other components within normal limits  I-STAT CHEM 8, ED - Abnormal; Notable for the following components:   Potassium 3.2 (*)    Glucose, Bld 203 (*)    Calcium, Ion 1.01 (*)    TCO2 18 (*)    Hemoglobin 16.0 (*)    HCT 47.0 (*)    All other components within normal limits  TROPONIN I (HIGH SENSITIVITY) - Abnormal; Notable for the following components:   Troponin I (High Sensitivity) 30 (*)    All other components within normal limits  CULTURE, BLOOD (ROUTINE X 2)  CULTURE, BLOOD (ROUTINE X 2)  RESP PANEL BY RT-PCR (FLU A&B, COVID) ARPGX2  LIPASE, BLOOD  PROTIME-INR  MAGNESIUM  CK  BRAIN NATRIURETIC PEPTIDE  SALICYLATE LEVEL  RAPID URINE DRUG SCREEN, HOSP PERFORMED  URINALYSIS, ROUTINE W REFLEX MICROSCOPIC  TSH  LACTIC ACID, PLASMA  LACTIC ACID, PLASMA  AMMONIA  OSMOLALITY  I-STAT BETA HCG BLOOD, ED ([facility], [facility], [facility] ONLY)  I-STAT ARTERIAL BLOOD GAS, ED    EKG EKG Interpretation  Date/Time:  Thursday October 02 2021 06:53:15 EDT Ventricular Rate:  96 PR Interval:  174 QRS Duration: 95 QT Interval:  412 QTC Calculation: 398 R Axis:   -35 Text Interpretation: Ectopic atrial rhythm Ventricular bigeminy Abnormal R-wave progression, early transition Left ventricular hypertrophy Anterior Q waves, possibly due to LVH TECHNICALLY DIFFICULT Confirmed by Wynona Dove ([id number]) on 10/09/2021 7:10:33 AM  Radiology CT Angio Chest PE W and/or Wo Contrast  Result Date: 10/01/2021 CLINICAL DATA:  Acute chest and abdominal pain. EXAM: CT ANGIOGRAPHY CHEST CT ABDOMEN AND PELVIS WITH CONTRAST TECHNIQUE: Multidetector CT imaging of the chest was performed  using the standard protocol during bolus administration of intravenous contrast. Multiplanar CT image reconstructions and MIPs were obtained to evaluate the vascular anatomy. Multidetector CT imaging of the abdomen and pelvis was performed using the standard protocol during bolus administration of intravenous contrast. RADIATION DOSE REDUCTION: This exam was performed according to the departmental dose-optimization program which includes automated exposure control, adjustment of the mA and/or kV according to patient size and/or use of iterative reconstruction technique. CONTRAST:  152m OMNIPAQUE IOHEXOL 350 MG/ML SOLN COMPARISON:  None Available. FINDINGS: CTA CHEST FINDINGS Cardiovascular: The heart is borderline enlarged for age. This is mainly due to enlarged left atrium and left ventricle. The aorta is normal in caliber. No atherosclerotic calcifications. The pulmonary arterial tree is well opacified. No filling defects to suggest pulmonary embolism. Mediastinum/Nodes: No mediastinal or hilar mass or lymphadenopathy. The endotracheal tube is in good position at the mid tracheal level. There is an NG tube coursing down the esophagus and into the stomach. Lungs/Pleura: Bilateral lower lobe pulmonary infiltrates could suggest aspiration. No pulmonary lesions, pulmonary edema or pneumothorax. No pleural effusions. Musculoskeletal: The bony thorax is intact. Review of the MIP images confirms the above findings. CT ABDOMEN and PELVIS FINDINGS Hepatobiliary: Low-attenuation lesion at the left hepatic dome is most likely a benign cyst. There are 2 small peripheral enhancing nodules in the right hepatic lobe one is located near the IVC other is near the upper pole region of the right kidney. These both measure approximately 11 mm. The could be small flash filling hemangiomas or small vascular  shunts. I would recommend a follow-up MRI examination without and with contrast in 6 months to document stability. Suspect mild  diffuse fatty infiltration of the liver with fatty sparing around the gallbladder fossa. The portal and hepatic veins are patent. The gallbladder is grossly normal. No common bile duct dilatation. Pancreas: No mass, inflammation or ductal dilatation. Spleen: Normal size. No focal lesions. Adrenals/Urinary Tract: Adrenal glands and kidneys are unremarkable. No renal lesions or hydronephrosis. The bladder is mildly distended. No bladder mass or calculi. Stomach/Bowel: The stomach contains an NG tube. The duodenum, small bowel and colon are unremarkable. The terminal ileum and appendix are normal. Vascular/Lymphatic: The aorta is normal in caliber. No dissection. The branch vessels are patent. The major venous structures are patent. No mesenteric or retroperitoneal mass or adenopathy. Small scattered lymph nodes are noted. Reproductive: The uterus and ovaries are unremarkable. Other: No pelvic mass or adenopathy. No free pelvic fluid collections. No inguinal mass or adenopathy. No abdominal wall hernia or subcutaneous lesions. Musculoskeletal: No significant bony findings. Review of the MIP images confirms the above findings. IMPRESSION: 1. Left-sided cardiac enlargement. 2. No CT findings for pulmonary embolism. 3. Bilateral lower lobe pulmonary infiltrates could suggest aspiration pneumonia. 4. No acute abdominal/pelvic findings. 5. Suspect mild diffuse fatty infiltration of the liver with fatty sparing around the gallbladder fossa. 6. Two small peripheral enhancing hepatic lesions as detailed above. These could be flash filling hemangiomas or vascular shunts. Recommend follow-up MR examination in 6 months. 7. Mild bladder distention. Electronically Signed   By: Marijo Sanes M.D.   On: 10/25/2021 08:08   CT ABDOMEN PELVIS W CONTRAST  Result Date: 10/07/2021 CLINICAL DATA:  Acute chest and abdominal pain. EXAM: CT ANGIOGRAPHY CHEST CT ABDOMEN AND PELVIS WITH CONTRAST TECHNIQUE: Multidetector CT imaging of the  chest was performed using the standard protocol during bolus administration of intravenous contrast. Multiplanar CT image reconstructions and MIPs were obtained to evaluate the vascular anatomy. Multidetector CT imaging of the abdomen and pelvis was performed using the standard protocol during bolus administration of intravenous contrast. RADIATION DOSE REDUCTION: This exam was performed according to the departmental dose-optimization program which includes automated exposure control, adjustment of the mA and/or kV according to patient size and/or use of iterative reconstruction technique. CONTRAST:  177m OMNIPAQUE IOHEXOL 350 MG/ML SOLN COMPARISON:  None Available. FINDINGS: CTA CHEST FINDINGS Cardiovascular: The heart is borderline enlarged for age. This is mainly due to enlarged left atrium and left ventricle. The aorta is normal in caliber. No atherosclerotic calcifications. The pulmonary arterial tree is well opacified. No filling defects to suggest pulmonary embolism. Mediastinum/Nodes: No mediastinal or hilar mass or lymphadenopathy. The endotracheal tube is in good position at the mid tracheal level. There is an NG tube coursing down the esophagus and into the stomach. Lungs/Pleura: Bilateral lower lobe pulmonary infiltrates could suggest aspiration. No pulmonary lesions, pulmonary edema or pneumothorax. No pleural effusions. Musculoskeletal: The bony thorax is intact. Review of the MIP images confirms the above findings. CT ABDOMEN and PELVIS FINDINGS Hepatobiliary: Low-attenuation lesion at the left hepatic dome is most likely a benign cyst. There are 2 small peripheral enhancing nodules in the right hepatic lobe one is located near the IVC other is near the upper pole region of the right kidney. These both measure approximately 11 mm. The could be small flash filling hemangiomas or small vascular shunts. I would recommend a follow-up MRI examination without and with contrast in 6 months to document  stability. Suspect mild diffuse  fatty infiltration of the liver with fatty sparing around the gallbladder fossa. The portal and hepatic veins are patent. The gallbladder is grossly normal. No common bile duct dilatation. Pancreas: No mass, inflammation or ductal dilatation. Spleen: Normal size. No focal lesions. Adrenals/Urinary Tract: Adrenal glands and kidneys are unremarkable. No renal lesions or hydronephrosis. The bladder is mildly distended. No bladder mass or calculi. Stomach/Bowel: The stomach contains an NG tube. The duodenum, small bowel and colon are unremarkable. The terminal ileum and appendix are normal. Vascular/Lymphatic: The aorta is normal in caliber. No dissection. The branch vessels are patent. The major venous structures are patent. No mesenteric or retroperitoneal mass or adenopathy. Small scattered lymph nodes are noted. Reproductive: The uterus and ovaries are unremarkable. Other: No pelvic mass or adenopathy. No free pelvic fluid collections. No inguinal mass or adenopathy. No abdominal wall hernia or subcutaneous lesions. Musculoskeletal: No significant bony findings. Review of the MIP images confirms the above findings. IMPRESSION: 1. Left-sided cardiac enlargement. 2. No CT findings for pulmonary embolism. 3. Bilateral lower lobe pulmonary infiltrates could suggest aspiration pneumonia. 4. No acute abdominal/pelvic findings. 5. Suspect mild diffuse fatty infiltration of the liver with fatty sparing around the gallbladder fossa. 6. Two small peripheral enhancing hepatic lesions as detailed above. These could be flash filling hemangiomas or vascular shunts. Recommend follow-up MR examination in 6 months. 7. Mild bladder distention. Electronically Signed   By: Marijo Sanes M.D.   On: 10/21/2021 08:08   CT Head Wo Contrast  Result Date: 09/28/2021 CLINICAL DATA:  Neck trauma, intoxicated or obtunded. Altered mental status. EXAM: CT HEAD WITHOUT CONTRAST CT CERVICAL SPINE WITHOUT CONTRAST  TECHNIQUE: Multidetector CT imaging of the head and cervical spine was performed following the standard protocol without intravenous contrast. Multiplanar CT image reconstructions of the cervical spine were also generated. RADIATION DOSE REDUCTION: This exam was performed according to the departmental dose-optimization program which includes automated exposure control, adjustment of the mA and/or kV according to patient size and/or use of iterative reconstruction technique. COMPARISON:  10/03/2020 FINDINGS: CT HEAD FINDINGS Brain: Large volume of subarachnoid hemorrhage in the basal cisterns reaching the cerebral convexities and refluxing into the fourth ventricle. No hydrocephalus. Effacement of subarachnoid spaces; no shift or herniation. No masslike finding or convincing cortical infarct. Vascular: Vessels are largely obscured. Skull: No signs of trauma Sinuses/Orbits: Probable extension of subarachnoid blood into the optic nerve sheaths bilaterally. Critical Value/emergent results were called by telephone at the time of interpretation on 10/09/2021 at 8:03 am to Dr Gustavus Messing , who is already aware. CT CERVICAL SPINE FINDINGS Alignment: No traumatic malalignment Skull base and vertebrae: No acute fracture Soft tissues and spinal canal: Small volume extension of subarachnoid blood into the upper canal. Unremarkable appearance of endotracheal and orogastric tubes in the neck. Disc levels:  No significant degenerative finding. Upper chest: Mild dependent atelectasis and interlobular septal thickening, there is pending chest CT. 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 Intraventricular reflux without hydrocephalus. Negative cervical spine CT. Electronically Signed   By: Jorje Guild M.D.   On: 10/07/2021 08:06   CT Cervical Spine Wo Contrast  Result Date: 10/14/2021 CLINICAL DATA:  Neck trauma, intoxicated or obtunded. Altered mental status. EXAM: CT HEAD WITHOUT CONTRAST CT  CERVICAL SPINE WITHOUT CONTRAST TECHNIQUE: Multidetector CT imaging of the head and cervical spine was performed following the standard protocol without intravenous contrast. Multiplanar CT image reconstructions of the cervical spine were also generated. RADIATION DOSE REDUCTION: This exam was performed  according to the departmental dose-optimization program which includes automated exposure control, adjustment of the mA and/or kV according to patient size and/or use of iterative reconstruction technique. COMPARISON:  10/03/2020 FINDINGS: CT HEAD FINDINGS Brain: Large volume of subarachnoid hemorrhage in the basal cisterns reaching the cerebral convexities and refluxing into the fourth ventricle. No hydrocephalus. Effacement of subarachnoid spaces; no shift or herniation. No masslike finding or convincing cortical infarct. Vascular: Vessels are largely obscured. Skull: No signs of trauma Sinuses/Orbits: Probable extension of subarachnoid blood into the optic nerve sheaths bilaterally. Critical Value/emergent results were called by telephone at the time of interpretation on 10/05/2021 at 8:03 am to Dr Gustavus Messing , who is already aware. CT CERVICAL SPINE FINDINGS Alignment: No traumatic malalignment Skull base and vertebrae: No acute fracture Soft tissues and spinal canal: Small volume extension of subarachnoid blood into the upper canal. Unremarkable appearance of endotracheal and orogastric tubes in the neck. Disc levels:  No significant degenerative finding. Upper chest: Mild dependent atelectasis and interlobular septal thickening, there is pending chest CT. 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 Intraventricular reflux without hydrocephalus. Negative cervical spine CT. Electronically Signed   By: Jorje Guild M.D.   On: 10/09/2021 08:06   DG Chest Portable 1 View  Result Date: 10/21/2021 CLINICAL DATA:  Ventilator dependent respiratory failure. EXAM: PORTABLE CHEST 1 VIEW  COMPARISON:  PA Lat 01/12/2017. FINDINGS: 7:04 a.m. ETT tip is 2.3 cm from the carina. NGT passes well into the stomach but neither the proximal side-hole or tip of the tube are filmed. Mild cardiomegaly is seen with mild perihilar vascular prominence and basilar interstitial edema. No significant pleural effusion is seen. There is patchy opacity in the retrocardiac left lower lobe concerning for pneumonia or aspiration. The lungs are otherwise clear of focal opacities. The thoracic cage is intact. IMPRESSION: Cardiomegaly with perihilar vascular prominence and mild basilar interstitial edema. Increased opacity left lower lobe concerning for pneumonia or aspiration. No significant pleural collections. Support tubes as above. Electronically Signed   By: Telford Nab M.D.   On: 10/14/2021 07:21    Procedures .Critical Care  Performed by: Jeanell Sparrow, DO Authorized by: Jeanell Sparrow, DO   Critical care provider statement:    Critical care time (minutes):  45   Critical care time was exclusive of:  Separately billable procedures and treating other patients   Critical care was necessary to treat or prevent imminent or life-threatening deterioration of the following conditions:  Respiratory failure and CNS failure or compromise   Critical care was time spent personally by me on the following activities:  Development of treatment plan with patient or surrogate, discussions with consultants, evaluation of patient's response to treatment, examination of patient, ordering and review of laboratory studies, ordering and review of radiographic studies, ordering and performing treatments and interventions, pulse oximetry, re-evaluation of patient's condition, review of old charts and obtaining history from patient or surrogate Procedure Name: Intubation Date/Time: 10/19/2021 8:02 AM  Performed by: Jeanell Sparrow, DOPre-anesthesia Checklist: Patient identified, Patient being monitored, Emergency Drugs available,  Timeout performed and Suction available Oxygen Delivery Method: Non-rebreather mask Preoxygenation: Pre-oxygenation with 100% oxygen Induction Type: Rapid sequence Ventilation: Mask ventilation without difficulty Laryngoscope Size: Glidescope and 4 Grade View: Grade I Tube type: Subglottic suction tube Tube size: 7.5 mm Number of attempts: 1 Placement Confirmation: ETT inserted through vocal cords under direct vision, CO2 detector and Breath sounds checked- equal and bilateral Secured at: 23 cm Tube secured with:  ETT holder Dental Injury: Teeth and Oropharynx as per pre-operative assessment     Ultrasound ED FAST  Date/Time: 10/03/2021 8:05 AM  Performed by: Jeanell Sparrow, DO Authorized by: Jeanell Sparrow, DO  Procedure details:     Indications comment:  Unresponsive    Assess for:  Hemothorax, intra-abdominal fluid, pericardial effusion and pneumothorax    Technique:  Abdominal, cardiac and chest    Images: not archived      Abdominal findings:    L kidney:  Visualized   R kidney:  Visualized   Liver:  Visualized    Bladder:  Visualized, Foley catheter not visualized   Hepatorenal space visualized: identified     Splenorenal space: identified     Rectovesical free fluid: not identified     Splenorenal free fluid: not identified     Hepatorenal space free fluid: not identified   Cardiac findings:    Heart:  Visualized   Wall motion: identified     Pericardial effusion: not identified   Chest findings:    L lung sliding: identified     R lung sliding: identified     Fluid in thorax: not identified       Medications Ordered in ED Medications  propofol (DIPRIVAN) 1000 MG/100ML infusion (15 mcg/kg/min  56.7 kg Intravenous Infusion Verify 10/15/2021 0810)  fentaNYL 2528mg in NS 25108m(1090mml) infusion-PREMIX (50 mcg/hr Intravenous Infusion Verify 10/19/2021 0810)  etomidate (AMIDATE) injection (20 mg Intravenous Given 10/25/2021 0651)  rocuronium (ZEMURON) injection (80 mg  Intravenous Given 10/21/2021 0652)  clevidipine (CLEVIPREX) infusion 0.5 mg/mL (4 mg/hr Intravenous Infusion Verify 10/11/2021 0811)  sodium chloride 0.9 % bolus 1,000 mL (1,000 mLs Intravenous New Bag/Given 10/11/2021 0725)  iohexol (OMNIPAQUE) 350 MG/ML injection 100 mL (100 mLs Intravenous Contrast Given 10/06/2021 0753)    ED Course/ Medical Decision Making/ A&P Clinical Course as of 10/01/2021 0814  Thu Oct 02, 2021  0753 CT Head Wo Contrast [BM]  0[facility]    Clinical Course User Index [BM] MulTeola BradleyD [SG] GraJeanell SparrowO                           Medical Decision Making Amount and/or Complexity of Data Reviewed Labs: ordered. Radiology: ordered.  Risk Prescription drug management.   This patient presents to the ED with chief complaint(s) of ams with pertinent past medical history of above which further complicates the presenting complaint. The complaint involves an extensive differential diagnosis and also carries with it a high risk of complications and morbidity.    Differential diagnoses for altered mental status includes but is not exclusive to alcohol, illicit or prescription medications, intracranial pathology such as stroke, intracerebral hemorrhage, fever or infectious causes including sepsis, hypoxemia, uremia, trauma, endocrine related disorders such as diabetes, hypoglycemia, thyroid-related diseases, etc.  Serious etiologies were considered.   Patient altered on arrival, she is not hypoxic but plan to intubate for airway protection.  She is given Narcan 2 mg IV without change to her mental status.  No external evidence of head trauma on exam.  GCS was 4 on arrival, she was not hypoxic, no change to mental status w/ narcan, proceed w/ emergent intubation for airway protection; will use roc/etomidate. Fent/prop post.   Additional history obtained: Additional history obtained from family and EMS  Records reviewed [facility] prior  ED visit, prior urgent care  Independent labs interpretation:  The following  labs were independently interpreted:  WBC 14.5, Hgb 15.3, ?dehydration, does not appear to be septic currently Istat chem with Na 139 and Cr 1.01 Preg neg >>> remainder of labs pending   Independent visualization of imaging: - I independently visualized the following imaging with scope of interpretation limited to determining acute life threatening conditions related to emergency care: Chest x-ray, CT head, CT cervical spine, CTP, CTAP, which revealed x-ray with ET tube in appropriate position. Large SAH noted on CTH. CTPE with ?aspiration, no PE. ?hemangioma to liver on CTAP; rad's recommends MRI in 6 mos.   Cardiac monitoring was reviewed and interpreted by myself which shows sinus tachy > nsr  Treatment and Reassessment: Clevipex gtt given SAH; elevated HOB Fentanyl/prop gtt Narcan  >> no improvement w/ narcan  Consultation: - Consulted or discussed management/test interpretation w/ external professional: neurology Dr Curly [place], pending callback from [place] and neurointerventional  Consideration for admission or further workup: Admission was considered   Plan admit, pending d/w consultants. Signed out to incoming EDP pending remainder of w/u and eventual admission.     Social Determinants of health: Social History   Tobacco Use   Smoking status: Former    Packs/day: 0.25    Types: Cigarettes   Smokeless tobacco: Former  Substance Use Topics   Alcohol use: No   Drug use: No            Final Clinical Impression(s) / ED Diagnoses Final diagnoses:  Altered mental status, unspecified altered mental status type  SAH (subarachnoid hemorrhage) [facility])    Rx / DC Orders ED Discharge Orders     None         Jeanell Sparrow, DO 10/05/2021 [phone]

## 2021-10-02 NOTE — ED Notes (Signed)
Propofol turned off per MD at bedside.

## 2021-10-02 NOTE — Progress Notes (Signed)
Chaplain responded to page from pt's nurse with request for support for family.  Family denied wanting a visit from [place] at this point saying, "To speak with a Chaplain makes it feel like we're giving up on her."    Chaplain on standby should family have a need.  [name]

## 2021-10-02 NOTE — Progress Notes (Signed)
eLink Physician-Brief Progress Note Patient Name: [name] DOB: Jan 21, 1976 MRN: [id number]   Date of Service  10/22/2021  HPI/Events of Note  Multiple issues: 1. Remains hypotensive on Norepinephrine IV infusion at maximum dose via PIV. Still receiving ordered 0.9 NaCl IV bolus. 2. K+ = 2.6 on 5 AM ABG. Nursing request for BMP.  eICU Interventions  Plan: PCCM ground team notified of need for central venous line vs GOC. BMP STAT.     Intervention Category Major Interventions: Electrolyte abnormality - evaluation and management  Lavaughn Bisig [place] 10/13/2021, 9:11 PM

## 2021-10-02 NOTE — ED Notes (Signed)
Family at bedside. 

## 2021-10-02 NOTE — Progress Notes (Signed)
  Transition of Care [facility]) Screening Note   Patient Details  Name: [name] Date of Birth: Dec 31, 1975   Transition of Care [facility]) CM/SW Contact:    Cyndi Bender, RN Phone Number: 09/30/2021, 3:01 PM    Transition of Care Department [facility]) has reviewed patient and no TOC needs have been identified at this time. We will continue to monitor patient advancement through interdisciplinary progression rounds. If new patient transition needs arise, please place a TOC consult.

## 2021-10-02 NOTE — Progress Notes (Signed)
Pharmacy Antibiotic Note  [name] is a 46 y.o. female admitted on 10/15/2021 with [facility] after being found unresponsive and experiencing a cardiac arrest.  Pharmacy has been consulted for Unasyn dosing for aspiration pneumonia.  CrCl 60-70 mL/min which appears baseline per chart review.  She does have a pencillin allergy documented due to having an "unknown" response to the question "Has patient had a PCN reaction causing immediate rash, facial/tongue/throat swelling, SOB or lightheadedness with hypotension". Allergy was documented in 2013. No previous antibiotics administered in our system and no outside prescriptions to compare. Spoke with CCM - okay to continue given time-period since documented as well as patient is mechanically ventilated.  Plan: Start Unasyn 3g Q6H Monitor renal function and culture results to adjust dose/therapy if needed.  Height: '5\' 2"'$  (157.5 cm) Weight: 56.7 kg (125 lb) IBW/kg (Calculated) : 50.1  Temp (24hrs), Avg:95.5 F (35.3 C), Min:94.9 F (34.9 C), Max:96.5 F (35.8 C)  Recent Labs  Lab 10/13/2021 0658 10/25/2021 0708 10/14/2021 0853  WBC 14.5*  --   --   CREATININE 0.78 0.60  --   LATICACIDVEN 3.6*  --  4.1*    Estimated Creatinine Clearance: 68.8 mL/min (by C-G formula based on SCr of 0.6 mg/dL).    Allergies  Allergen Reactions   Penicillins Hives    Has patient had a PCN reaction causing immediate rash, facial/tongue/throat swelling, SOB or lightheadedness with hypotension: Unknown Has patient had a PCN reaction causing severe rash involving mucus membranes or skin necrosis: No Has patient had a PCN reaction that required hospitalization: No Has patient had a PCN reaction occurring within the last 10 years: No If all of the above answers are "NO", then may proceed with Cephalosporin use.     Antimicrobials this admission: Unasyn 9/7 >>  Microbiology results: 9/7 Bcx: sent  Thank you for allowing pharmacy to be a part of this patient's  care.  Merrilee Jansky, PharmD Clinical Pharmacist 09/27/2021 3:06 PM

## 2021-10-02 NOTE — Consult Note (Signed)
NAME:  [name], MRN:  [id number], DOB:  13-Jun-1975, LOS: 0 ADMISSION DATE:  09/30/2021, CONSULTATION DATE:  9/7 REFERRING MD:  ED, CHIEF COMPLAINT:  SAH   History of Present Illness:  [name] is a 46 yo female with hypertension who presented to the ED after being found unresponsive by her husband. The patient was last seen at her baseline last night and this morning, around 6am, the patient's husband noted that she was "breathing funny" and making gurgling noises. He attempted to wake her and she was unresponsive and had his daughter call EMS. He started CPR and upon EMS arrival, the patient was in SVT and they performed DCCV. HR improved, although she had no improvement in her mental status.   GCS 4 on arrival to ED, proceeded with emergent intubation. Large SAH was noted on CTH. WBC 14.5, Hb 15.3. K 3.2, Cr wnl. Lactic 3.6. Neurosurg and PCCM consulted.   No family history of aneurysms known.  Pertinent  Medical History  Hypertension, fibroids, legally blind in left eye  Significant Hospital Events: Including procedures, antibiotic start and stop dates in addition to other pertinent events   9/7 presented to ED unresponsive and hypertensive, CTH revealed SAH  Interim History / Subjective:  Unresponsive on ventilator. +Cough/gag reflex. Moves extremities to noxious stimuli. Pupils non-reactive.   Objective   Blood pressure (!) 156/87, pulse 79, temperature (!) 96.5 F (35.8 C), temperature source Temporal, resp. rate 20, height '5\' 2"'$  (1.575 m), weight 56.7 kg, SpO2 98 %.    Vent Mode: PRVC FiO2 (%):  [100 %] 100 % Set Rate:  [18 bmp] 18 bmp Vt Set:  [450 mL] 450 mL PEEP:  [5 cmH20] 5 cmH20 Plateau Pressure:  [15 cmH20] 15 cmH20   Intake/Output Summary (Last 24 hours) at 10/08/2021 0845 Last data filed at 10/12/2021 8299 Gross per 24 hour  Intake 6.97 ml  Output --  Net 6.97 ml   Filed Weights   10/18/2021 0722  Weight: 56.7 kg    Examination: General: ill  appearing, sedated on vent HENT: [place]/AT, pupils nonreactive Lungs: Decreased air movement, no stridor Cardiovascular: Regular rate, rhythm. No murmurs Abdomen: Soft, nontender, nondistended Extremities: No edema Neuro: Pupils nonreactive, no doll's eye reflex, +cough and gag reflex. Moves all 4 extremities slightly to noxious stimuli  Resolved Hospital Problem list     Assessment & Plan:  SAH Severe hypertension  Last known well yesterday evening. Was found to be unresponsive this morning by husband and presented to ED via EMS. CTH with large volume of aneurysmal pattern subarachnoid hemorrhage in basal cisterns, refluxing into 4th ventricle without hydrocephalus.  Hunt&Hess grade IV/V, Fisher grade IV.  On vent currently: PRVC, RR 18 (will increase to 24), Vt 450, PEEP 5, FiO2 100% - Admit to neuro ICU - Cleviprex gtt; goal SBP <160 - Maintain euvolemia - Frequent neuro checks - Elevate HOB to 30 degrees - IV Mannitol 100 g - Obtain blood cultures - Obtain CTA - Continue mechanical ventilation; mentation precludes SBT  Hypokalemia K 2.6 on ABG.  - Replete K prn    Best Practice (right click and "Reselect all SmartList Selections" daily)   Diet/type: NPO DVT prophylaxis: not indicated GI prophylaxis: PPI Lines: N/A Foley:  N/A Code Status:  full code Last date of multidisciplinary goals of care discussion [updated family (husband, daughter, father) at bedside 9/7]  Labs   CBC: Recent Labs  Lab 10/16/2021 0658 10/17/2021 0708 10/16/2021 0804  WBC 14.5*  --   --  NEUTROABS PENDING  --   --   HGB 15.3* 16.0* 13.3  HCT 45.1 47.0* 39.0  MCV 90.0  --   --   PLT 189  --   --     Basic Metabolic Panel: Recent Labs  Lab 10/04/2021 0658 10/01/2021 0708 10/06/2021 0804  NA 135 139 137  K 3.1* 3.2* 2.6*  CL 103 106  --   CO2 17*  --   --   GLUCOSE 202* 203*  --   BUN 9 8  --   CREATININE 0.78 0.60  --   CALCIUM 8.8*  --   --   MG 1.8  --   --    GFR: Estimated  Creatinine Clearance: 68.8 mL/min (by C-G formula based on SCr of 0.6 mg/dL). Recent Labs  Lab 10/23/2021 0658  WBC 14.5*    Liver Function Tests: Recent Labs  Lab 10/16/2021 0658  AST 64*  ALT 50*  ALKPHOS 41  BILITOT 0.9  PROT 6.7  ALBUMIN 3.6   Recent Labs  Lab 10/10/2021 0658  LIPASE 41   No results for input(s): "AMMONIA" in the last 168 hours.  ABG    Component Value Date/Time   PHART 7.409 10/17/2021 0804   PCO2ART 34.1 10/17/2021 0804   PO2ART 158 (H) 10/13/2021 0804   HCO3 21.8 10/11/2021 0804   TCO2 23 10/21/2021 0804   ACIDBASEDEF 3.0 (H) 10/11/2021 0804   O2SAT 99 10/22/2021 0804     Coagulation Profile: Recent Labs  Lab 10/25/2021 0658  INR 1.2    Cardiac Enzymes: Recent Labs  Lab 10/08/2021 0658  CKTOTAL 221    HbA1C: No results found for: "HGBA1C"  CBG: Recent Labs  Lab 10/22/2021 0657  GLUCAP 195*    Past Medical History:  She,  has a past medical history of Blind left eye, Fibroid, Hypertension, Vaginal Pap smear, abnormal, Visual loss, right eye, and Vitamin D deficiency.   Surgical History:   Past Surgical History:  Procedure Laterality Date   cryotheraphy     EYE SURGERY       Social History:   reports that she has quit smoking. Her smoking use included cigarettes. She smoked an average of .25 packs per day. She has quit using smokeless tobacco. She reports that she does not drink alcohol and does not use drugs.   Family History:  Her family history includes Breast cancer in her paternal aunt; Heart failure in her father; Hypertension in her father and mother.   Allergies Allergies  Allergen Reactions   Penicillins Hives    Has patient had a PCN reaction causing immediate rash, facial/tongue/throat swelling, SOB or lightheadedness with hypotension: Unknown Has patient had a PCN reaction causing severe rash involving mucus membranes or skin necrosis: No Has patient had a PCN reaction that required hospitalization: No Has  patient had a PCN reaction occurring within the last 10 years: No If all of the above answers are "NO", then may proceed with Cephalosporin use.      Home Medications  Prior to Admission medications   Medication Sig Start Date End Date Taking? Authorizing Provider  albuterol (PROVENTIL HFA;VENTOLIN HFA) 108 (90 BASE) MCG/ACT inhaler Inhale 1-2 puffs into the lungs every 6 (six) hours as needed for wheezing or shortness of breath. 03/21/14   Fransico Meadow, PA-C  atorvastatin (LIPITOR) 20 MG tablet Take 20 mg by mouth daily at 6 PM.     [provider]  Cholecalciferol (VITAMIN D PO) Take 1 tablet  by mouth daily.    [provider]  Cyanocobalamin (B-12) 2500 MCG TABS Take 2,500 mcg by mouth daily.    [provider]  dicyclomine (BENTYL) 20 MG tablet Take 1 tablet (20 mg total) by mouth 4 (four) times daily -  before meals and at bedtime for 5 days. 01/12/17 01/17/17  Duffy Bruce, MD  ibuprofen (ADVIL,MOTRIN) 200 MG tablet Take 200 mg by mouth 2 (two) times daily as needed for mild pain.    [provider]  ibuprofen (ADVIL,MOTRIN) 800 MG tablet Take 1 tablet (800 mg total) by mouth 3 (three) times daily. 06/20/16   Keitha Butte, CNM  loperamide (IMODIUM) 2 MG capsule Take 1 capsule (2 mg total) by mouth 4 (four) times daily as needed for diarrhea or loose stools. 01/12/17   Duffy Bruce, MD  megestrol (MEGACE) 40 MG tablet Take 1 tablet (40 mg total) by mouth 3 (three) times daily. Patient not taking: Reported on 07/31/2016 06/20/16   Keitha Butte, CNM  ondansetron (ZOFRAN ODT) 4 MG disintegrating tablet Take 1 tablet (4 mg total) by mouth every 8 (eight) hours as needed for nausea or vomiting. 01/12/17   Duffy Bruce, MD  PATADAY 0.2 % SOLN Place 1 drop into both eyes daily.  12/30/14   [provider]  valsartan-hydrochlorothiazide (DIOVAN-HCT) 160-25 MG per tablet Take 1 tablet by mouth daily.  03/19/13   [provider]   VITAMIN E PO Take 1 tablet by mouth daily.    [provider]     Critical care time: 35 mins

## 2021-10-03 ENCOUNTER — Inpatient Hospital Stay: Payer: Self-pay

## 2021-10-03 DIAGNOSIS — I609 Nontraumatic subarachnoid hemorrhage, unspecified: Secondary | ICD-10-CM | POA: Diagnosis not present

## 2021-10-03 LAB — CBC
HCT: 43.9 % (ref 36.0–46.0)
Hemoglobin: 15.4 g/dL — ABNORMAL HIGH (ref 12.0–15.0)
MCH: 30.7 pg (ref 26.0–34.0)
MCHC: 35.1 g/dL (ref 30.0–36.0)
MCV: 87.5 fL (ref 80.0–100.0)
Platelets: 159 10*3/uL (ref 150–400)
RBC: 5.02 MIL/uL (ref 3.87–5.11)
RDW: 13.6 % (ref 11.5–15.5)
WBC: 16.7 10*3/uL — ABNORMAL HIGH (ref 4.0–10.5)
nRBC: 0.1 % (ref 0.0–0.2)

## 2021-10-03 LAB — BASIC METABOLIC PANEL
Anion gap: 11 (ref 5–15)
Anion gap: 13 (ref 5–15)
BUN: 14 mg/dL (ref 6–20)
BUN: 17 mg/dL (ref 6–20)
CO2: 14 mmol/L — ABNORMAL LOW (ref 22–32)
CO2: 18 mmol/L — ABNORMAL LOW (ref 22–32)
Calcium: 9.5 mg/dL (ref 8.9–10.3)
Calcium: 9.6 mg/dL (ref 8.9–10.3)
Chloride: 129 mmol/L — ABNORMAL HIGH (ref 98–111)
Chloride: 122 mmol/L — ABNORMAL HIGH (ref 98–111)
Creatinine, Ser: 1.61 mg/dL — ABNORMAL HIGH (ref 0.44–1.00)
Creatinine, Ser: 2.15 mg/dL — ABNORMAL HIGH (ref 0.44–1.00)
GFR, Estimated: 39 mL/min — ABNORMAL LOW (ref >60)
GFR, Estimated: 28 mL/min — ABNORMAL LOW (ref >60)
Glucose, Bld: 127 mg/dL — ABNORMAL HIGH (ref 70–99)
Glucose, Bld: 128 mg/dL — ABNORMAL HIGH (ref 70–99)
Potassium: 5.4 mmol/L — ABNORMAL HIGH (ref 3.5–5.1)
Potassium: 5.4 mmol/L — ABNORMAL HIGH (ref 3.5–5.1)
Sodium: 154 mmol/L — ABNORMAL HIGH (ref 135–145)
Sodium: 153 mmol/L — ABNORMAL HIGH (ref 135–145)

## 2021-10-03 LAB — MAGNESIUM: Magnesium: 2.3 mg/dL (ref 1.7–2.4)

## 2021-10-03 LAB — TRIGLYCERIDES: Triglycerides: 99 mg/dL (ref <150)

## 2021-10-03 MED ORDER — SODIUM CHLORIDE 0.9 % IV SOLN
3.0000 g | Freq: Two times a day (BID) | INTRAVENOUS | Status: DC
  Administered 2021-10-03 – 2021-10-06 (×6): 3 g via INTRAVENOUS
  Filled 2021-10-03 (×6): qty 8

## 2021-10-03 MED ORDER — SENNOSIDES-DOCUSATE SODIUM 8.6-50 MG PO TABS
1.0000 | ORAL_TABLET | Freq: Two times a day (BID) | ORAL | Status: DC
  Administered 2021-10-04 – 2021-11-28 (×69): 1
  Filled 2021-10-03 (×74): qty 1

## 2021-10-03 MED ORDER — SODIUM CHLORIDE 0.9 % IV BOLUS
500.0000 mL | Freq: Once | INTRAVENOUS | Status: AC
  Administered 2021-10-03: 500 mL via INTRAVENOUS

## 2021-10-03 MED ORDER — PANTOPRAZOLE 2 MG/ML SUSPENSION
40.0000 mg | Freq: Every day | ORAL | Status: DC
  Administered 2021-10-03 – 2021-10-10 (×8): 40 mg
  Filled 2021-10-03 (×8): qty 20

## 2021-10-03 NOTE — TOC CAGE-AID Note (Signed)
Transition of Care [facility]) - CAGE-AID Screening   Patient Details  Name: [name] MRN: [id number] Date of Birth: [birth year]  Transition of Care [facility]) CM/SW Contact:    Benard Halsted, LCSW Phone Number: 10/03/2021, 4:52 PM   Clinical Narrative: Patient intubated and unable to participate in screening.    CAGE-AID Screening: Substance Abuse Screening unable to be completed due to: : Patient unable to participate

## 2021-10-03 NOTE — Progress Notes (Signed)
Spoke with Jorene Guest, RN for this pt. Stated the Levophed is currently being titrated down , and the goal is to stop the medication depending on pt status. RN is agreeable to wait and see how the patient tolerates the wean, and will follow-up with PICC RN on nightshift, if not before.

## 2021-10-03 NOTE — Progress Notes (Signed)
Pharmacy Antibiotic Note  [name] is a 46 y.o. female admitted on 10/16/2021 with [facility] after being found unresponsive and experiencing a cardiac arrest.  Pharmacy has been consulted for Unasyn dosing for aspiration pneumonia.  Patient is experiencing an AKI with Scr now 2.15 (up from 0.6 at admission). UOP 4.5 mL/kg/hr likely due to her brain injury. Expect this to be poor quality filtration and not a sign of drug clearance.  She has tolerated Unasyn doses overnight. Updated allergy to reflect.  Plan: Reduce Unasyn to 3g Q12H given AKI Monitor renal function and culture results to adjust dose/therapy if needed. CCM with plan to discontinue after 72 hours if no clear sign of infection. Will follow.  Height: '5\' 2"'$  (157.5 cm) Weight: 66 kg (145 lb 8.1 oz) IBW/kg (Calculated) : 50.1  Temp (24hrs), Avg:95.8 F (35.4 C), Min:94.9 F (34.9 C), Max:98.3 F (36.8 C)  Recent Labs  Lab 10/08/2021 0658 10/16/2021 0708 09/29/2021 0853 10/04/2021 2125 10/03/21 0740  WBC 14.5*  --   --   --  16.7*  CREATININE 0.78 0.60  --  1.61* 2.15*  LATICACIDVEN 3.6*  --  4.1*  --   --      Estimated Creatinine Clearance: 28.9 mL/min (A) (by C-G formula based on SCr of 2.15 mg/dL (H)).    Allergies  Allergen Reactions   Penicillins Hives    Tolerated Unasyn during 09/2021 admission     Antimicrobials this admission: Unasyn 9/7 >>  Microbiology results: 9/7 Bcx: NGTD  Thank you for allowing pharmacy to be a part of this patient's care.  Merrilee Jansky, PharmD Clinical Pharmacist 10/03/2021 10:24 AM

## 2021-10-03 NOTE — Progress Notes (Signed)
Pt's GFR <39, Dr. Oletta Darter ordered ok to place PICC.

## 2021-10-03 NOTE — Progress Notes (Addendum)
NAME:  [name], MRN:  [id number], DOB:  1975-05-10, LOS: 1 ADMISSION DATE:  10/18/2021, CONSULTATION DATE: 10/06/2021 REFERRING MD: ED physician, CHIEF COMPLAINT: Subarachnoid hemorrhage  History of Present Illness:  [name] is a 46 yo female with hypertension who presented to the ED after being found unresponsive by her husband. The patient was last seen at her baseline last night and this morning, around 6am, the patient's husband noted that she was "breathing funny" and making gurgling noises. He attempted to wake her and she was unresponsive and had his daughter call EMS. He started CPR and upon EMS arrival, the patient was in SVT and they performed DCCV. HR improved, although she had no improvement in her mental status.    GCS 4 on arrival to ED, proceeded with emergent intubation. Large SAH was noted on CTH. WBC 14.5, Hb 15.3. K 3.2, Cr wnl. Lactic 3.6. Neurosurg and PCCM consulted.    No family history of aneurysms known.   Pertinent  Medical History  Hypertension Uterine fibroids Legally blind left eye  Significant Hospital Events: Including procedures, antibiotic start and stop dates in addition to other pertinent events   9/7-presented unresponsive, hypotensive 9/7-CT head with subarachnoid hemorrhage  Interim History / Subjective:  Remains unresponsive on the ventilator No gag, no cough reflex Pupils nonreactive  Objective   Blood pressure (!) 137/108, pulse (!) 108, temperature 97.8 F (36.6 C), temperature source Axillary, resp. rate (!) 21, height '5\' 2"'$  (1.575 m), weight 66 kg, SpO2 100 %.    Vent Mode: PRVC FiO2 (%):  [40 %] 40 % Set Rate:  [24 bmp] 24 bmp Vt Set:  [400 mL] 400 mL PEEP:  [5 cmH20] 5 cmH20 Plateau Pressure:  [12 cmH20-16 cmH20] 16 cmH20   Intake/Output Summary (Last 24 hours) at 10/03/2021 0841 Last data filed at 10/03/2021 0800 Gross per 24 hour  Intake 4307.25 ml  Output 7100 ml  Net -2792.75 ml   Filed Weights   10/15/2021 0722  10/04/2021 1700  Weight: 56.7 kg 66 kg    Examination: General: Middle-age, does not appear pale HENT: Moist oral mucosa with endotracheal tube in place Lungs: Clear breath sounds Cardiovascular: S1-S2 appreciated Abdomen: Soft, bowel sounds appreciated Extremities: No clubbing, no edema Neuro: Nonreactive pupils no cough, no gag GU:   Not currently on any sedatives  CT head reviewed by myself CT chest reviewed  Resolved Hospital Problem list    Assessment & Plan:  Subarachnoid hemorrhage H/H5 Brainstem compression -Large volume subarachnoid hemorrhage without hydrocephalus -No surgical intervention will be of benefit -Appreciate neurosurgery management and family update  -Maintain systolic blood pressure less than 140  Uncontrolled hypertension -Maintain blood pressure as stated above -Hold home medications  Acute hypoxemic respiratory failure -Continue full ventilator support Target TVol 6-8cc/kgIBW Target Plateau Pressure < 30cm H20 Target driving pressure less than 15 cm of water Ventilator associated pneumonia prevention protocol  Concern for aspiration pneumonia -On Unasyn -Will stop Unasyn at 72 hours if no evidence of ongoing infection  Keppra for seizure prophylaxis  Cleviprex for blood pressure    Patient's prognosis is very poor  I did spend a significant amount of time discussing with patient's spouse, daughter and son at bedside. They will want [place] to continue current treatment measures.  Not able to decide on resuscitation status at present.  Patient's mother will be coming in tomorrow I did let them know that I am available for questions that they may have  Best Practice (  right click and "Reselect all SmartList Selections" daily)   Diet/type: NPO DVT prophylaxis: SCD GI prophylaxis: PPI Lines: N/A Foley:  N/A Code Status:  full code Last date of multidisciplinary goals of care discussion [discussed with family at bedside]  Labs    CBC: Recent Labs  Lab 10/23/2021 0658 10/25/2021 0708 09/29/2021 0804 10/03/21 0740  WBC 14.5*  --   --  16.7*  NEUTROABS 7.4  --   --   --   HGB 15.3* 16.0* 13.3 15.4*  HCT 45.1 47.0* 39.0 43.9  MCV 90.0  --   --  87.5  PLT 189  --   --  626    Basic Metabolic Panel: Recent Labs  Lab 10/21/2021 0658 09/29/2021 0708 10/01/2021 0804 10/09/2021 2125  NA 135 139 137 154*  K 3.1* 3.2* 2.6* 5.4*  CL 103 106  --  129*  CO2 17*  --   --  14*  GLUCOSE 202* 203*  --  127*  BUN 9 8  --  14  CREATININE 0.78 0.60  --  1.61*  CALCIUM 8.8*  --   --  9.5  MG 1.8  --   --   --    GFR: Estimated Creatinine Clearance: 38.5 mL/min (A) (by C-G formula based on SCr of 1.61 mg/dL (H)). Recent Labs  Lab 10/01/2021 0658 10/14/2021 0853 10/03/21 0740  WBC 14.5*  --  16.7*  LATICACIDVEN 3.6* 4.1*  --     Liver Function Tests: Recent Labs  Lab 10/06/2021 0658  AST 64*  ALT 50*  ALKPHOS 41  BILITOT 0.9  PROT 6.7  ALBUMIN 3.6   Recent Labs  Lab 10/20/2021 0658  LIPASE 41   No results for input(s): "AMMONIA" in the last 168 hours.  ABG    Component Value Date/Time   PHART 7.409 10/24/2021 0804   PCO2ART 34.1 10/08/2021 0804   PO2ART 158 (H) 10/01/2021 0804   HCO3 21.8 09/26/2021 0804   TCO2 23 10/14/2021 0804   ACIDBASEDEF 3.0 (H) 10/22/2021 0804   O2SAT 99 10/16/2021 0804     Coagulation Profile: Recent Labs  Lab 10/13/2021 0658  INR 1.2    Cardiac Enzymes: Recent Labs  Lab 10/17/2021 0658  CKTOTAL 221    HbA1C: No results found for: "HGBA1C"  CBG: Recent Labs  Lab 10/16/2021 0657  GLUCAP 195*    Review of Systems:   Unresponsive  Past Medical History:  She,  has a past medical history of Blind left eye, Fibroid, Hypertension, Vaginal Pap smear, abnormal, Visual loss, right eye, and Vitamin D deficiency.   Surgical History:   Past Surgical History:  Procedure Laterality Date   cryotheraphy     EYE SURGERY       Social History:   reports that she has quit  smoking. Her smoking use included cigarettes. She smoked an average of .25 packs per day. She has quit using smokeless tobacco. She reports that she does not drink alcohol and does not use drugs.   Family History:  Her family history includes Breast cancer in her paternal aunt; Heart failure in her father; Hypertension in her father and mother.   Allergies Allergies  Allergen Reactions   Penicillins Hives    Has patient had a PCN reaction causing immediate rash, facial/tongue/throat swelling, SOB or lightheadedness with hypotension: Unknown Has patient had a PCN reaction causing severe rash involving mucus membranes or skin necrosis: No Has patient had a PCN reaction that required hospitalization: No Has patient  had a PCN reaction occurring within the last 10 years: No If all of the above answers are "NO", then may proceed with Cephalosporin use.     The patient is critically ill with multiple organ systems failure and requires high complexity decision making for assessment and support, frequent evaluation and titration of therapies, application of advanced monitoring technologies and extensive interpretation of multiple databases. Critical Care Time devoted to patient care services described in this note independent of APP/resident time (if applicable)  is 55 minutes.   Sherrilyn Rist MD [facility] Personal pager: See Amion If unanswered, please page CCM On-call: [phone]

## 2021-10-03 NOTE — Progress Notes (Signed)
USPIV for vaso pressor attempted x 1 on left forearm, unsuccessful. Unable to find suitable veins for USPIV after assessment on bil forearms. RN made aware.

## 2021-10-03 NOTE — Progress Notes (Signed)
  NEUROSURGERY PROGRESS NOTE   Pt became hypotensive overnight requiring pressor support however family refuse placement of CVC.   EXAM:  BP (!) 149/107   Pulse (!) 117   Temp (!) 97 F (36.1 C)   Resp (!) 23   Ht '5\' 2"'$  (1.575 m)   Wt 66 kg   SpO2 100%   BMI 26.61 kg/m   Not breathing over vent Pupils non-reactive (-) corneal (-) cough/gag No motor responses to central noxious stim  IMPRESSION:  46 y.o. female SAH d# 2, remains comatose with minimal to no brainstem function. I cont to feel that given her initial presenting exam and lack of improvement/worsening condition overnight, that there is no meaningful chance of neurologic recovery. There is no role for angiography or any surgical intervention.  PLAN: - For now we will cont full support  I did review again the situation with the patient's family at bedside. I told them that given her initial and current exam I do not feel there is any chance of recovery. I recommended capping current therapy and making DNR however family want full code/aggressive management.    Consuella Lose, MD [facility]

## 2021-10-03 NOTE — Progress Notes (Signed)
Discussed with family at bedside about the need for central line. Risk with pressors was already discussed with the family and reiterated. They need time to confer with each other and let us know.

## 2021-10-03 NOTE — Progress Notes (Signed)
Pt receiving LEVOPHED at 28-30 mcg since last night which was approved by CCM team last night given the urgent need for a pressor and family refused to give consent for central line despite explanation of risks vs  benefits. This afternoon, IV watch on right hand alarmed. Eber Hong, charge nurse, called to bedside to help assess patient's right hand. It appeared to not be infiltrated. The LEVOPHED was switched to left AC quickly to avoid drastic drop in SBP.  IV watch placed on the left AC. Daughter and father became extremely angry because they claimed they were never told she would need a central line with the higher doses of LEVOPHED. Daughter stated we were "trying to kill" her mom. She stated we were "playing games" with her. She claimed we were "lying to her". Sarah and I calmly explained PIVs vs Central Lines and the indications for both. Dr. Ander Slade was called to bedside to reiterate the risks vs benefits of having a central line. Family took time after meeting with MD to decide if they wanted pt to have a central line. They wanted to proceed. Dr. Ander Slade asked me to find out if PICC can be placed today if not will proceed with central line. Awaiting response from PICC team. Will keep MD and family updated.

## 2021-10-04 ENCOUNTER — Inpatient Hospital Stay (HOSPITAL_COMMUNITY): Payer: Medicare Other

## 2021-10-04 DIAGNOSIS — I609 Nontraumatic subarachnoid hemorrhage, unspecified: Secondary | ICD-10-CM | POA: Diagnosis not present

## 2021-10-04 LAB — BASIC METABOLIC PANEL
Anion gap: 12 (ref 5–15)
BUN: 16 mg/dL (ref 6–20)
CO2: 19 mmol/L — ABNORMAL LOW (ref 22–32)
Calcium: 9.2 mg/dL (ref 8.9–10.3)
Chloride: 128 mmol/L — ABNORMAL HIGH (ref 98–111)
Creatinine, Ser: 3.42 mg/dL — ABNORMAL HIGH (ref 0.44–1.00)
GFR, Estimated: 16 mL/min — ABNORMAL LOW (ref >60)
Glucose, Bld: 122 mg/dL — ABNORMAL HIGH (ref 70–99)
Potassium: 3.5 mmol/L (ref 3.5–5.1)
Sodium: 159 mmol/L — ABNORMAL HIGH (ref 135–145)

## 2021-10-04 LAB — CBC WITH DIFFERENTIAL/PLATELET
Abs Immature Granulocytes: 0.06 10*3/uL (ref 0.00–0.07)
Basophils Absolute: 0 10*3/uL (ref 0.0–0.1)
Basophils Relative: 0 %
Eosinophils Absolute: 0.1 10*3/uL (ref 0.0–0.5)
Eosinophils Relative: 1 %
HCT: 36.7 % (ref 36.0–46.0)
Hemoglobin: 12.7 g/dL (ref 12.0–15.0)
Immature Granulocytes: 1 %
Lymphocytes Relative: 20 %
Lymphs Abs: 2.3 10*3/uL (ref 0.7–4.0)
MCH: 31 pg (ref 26.0–34.0)
MCHC: 34.6 g/dL (ref 30.0–36.0)
MCV: 89.5 fL (ref 80.0–100.0)
Monocytes Absolute: 0.5 10*3/uL (ref 0.1–1.0)
Monocytes Relative: 4 %
Neutro Abs: 8.7 10*3/uL — ABNORMAL HIGH (ref 1.7–7.7)
Neutrophils Relative %: 74 %
Platelets: 103 10*3/uL — ABNORMAL LOW (ref 150–400)
RBC: 4.1 MIL/uL (ref 3.87–5.11)
RDW: 13.6 % (ref 11.5–15.5)
WBC: 11.8 10*3/uL — ABNORMAL HIGH (ref 4.0–10.5)
nRBC: 0.2 % (ref 0.0–0.2)

## 2021-10-04 LAB — PATHOLOGIST SMEAR REVIEW

## 2021-10-04 LAB — GLUCOSE, CAPILLARY: Glucose-Capillary: 123 mg/dL — ABNORMAL HIGH (ref 70–99)

## 2021-10-04 MED ORDER — SODIUM CHLORIDE 0.9 % IV SOLN: INTRAVENOUS | Status: DC

## 2021-10-04 MED ORDER — SODIUM CHLORIDE 0.9% FLUSH
10.0000 mL | Freq: Two times a day (BID) | INTRAVENOUS | Status: DC
  Administered 2021-10-04: 10 mL
  Administered 2021-10-04 (×2): 20 mL
  Administered 2021-10-05 – 2021-10-10 (×10): 10 mL
  Administered 2021-10-11: 30 mL
  Administered 2021-10-11 – 2021-10-22 (×19): 10 mL
  Administered 2021-10-23: 20 mL
  Administered 2021-10-23: 10 mL
  Administered 2021-10-24: 20 mL
  Administered 2021-10-24: 10 mL
  Administered 2021-10-25: 20 mL
  Administered 2021-10-25: 10 mL
  Administered 2021-10-26 (×2): 20 mL
  Administered 2021-10-27: 30 mL
  Administered 2021-10-27: 20 mL
  Administered 2021-10-28 – 2021-10-30 (×6): 10 mL
  Administered 2021-10-31: 40 mL
  Administered 2021-10-31: 20 mL
  Administered 2021-11-01: 40 mL
  Administered 2021-11-01: 30 mL
  Administered 2021-11-02: 20 mL
  Administered 2021-11-02: 30 mL
  Administered 2021-11-03: 10 mL
  Administered 2021-11-03: 30 mL
  Administered 2021-11-04: 10 mL
  Administered 2021-11-04 – 2021-11-05 (×3): 20 mL
  Administered 2021-11-06: 10 mL
  Administered 2021-11-06 – 2021-11-07 (×2): 20 mL
  Administered 2021-11-07 – 2021-11-09 (×5): 10 mL
  Administered 2021-11-10: 20 mL
  Administered 2021-11-10 – 2021-11-11 (×2): 10 mL
  Administered 2021-11-11: 15 mL
  Administered 2021-11-12 (×2): 10 mL
  Administered 2021-11-13: 20 mL
  Administered 2021-11-13: 10 mL
  Administered 2021-11-14: 30 mL
  Administered 2021-11-14 – 2021-11-15 (×2): 20 mL
  Administered 2021-11-15 – 2021-11-17 (×5): 10 mL
  Administered 2021-11-18: 20 mL
  Administered 2021-11-18: 10 mL
  Administered 2021-11-19 – 2021-11-20 (×4): 20 mL
  Administered 2021-11-21: 30 mL
  Administered 2021-11-21 – 2021-11-22 (×2): 10 mL
  Administered 2021-11-22: 30 mL
  Administered 2021-11-23: 10 mL
  Administered 2021-11-23: 20 mL
  Administered 2021-11-24 (×2): 10 mL
  Administered 2021-11-25: 30 mL
  Administered 2021-11-26 – 2021-11-27 (×4): 20 mL
  Administered 2021-11-28 (×2): 10 mL
  Administered 2021-11-29: 20 mL

## 2021-10-04 MED ORDER — NOREPINEPHRINE 16 MG/250ML-% IV SOLN
0.0000 ug/min | INTRAVENOUS | Status: DC
  Administered 2021-10-04: 14 ug/min via INTRAVENOUS
  Administered 2021-10-05: 8 ug/min via INTRAVENOUS
  Administered 2021-10-07: 6 ug/min via INTRAVENOUS
  Filled 2021-10-04 (×3): qty 250

## 2021-10-04 MED ORDER — NOREPINEPHRINE 4 MG/250ML-% IV SOLN
0.0000 ug/min | INTRAVENOUS | Status: DC
  Administered 2021-10-04 (×3): 14 ug/min via INTRAVENOUS
  Filled 2021-10-04 (×3): qty 250

## 2021-10-04 MED ORDER — SODIUM CHLORIDE 0.9% FLUSH
10.0000 mL | INTRAVENOUS | Status: DC | PRN
  Administered 2021-11-28: 10 mL

## 2021-10-04 NOTE — Progress Notes (Signed)
  NEUROSURGERY PROGRESS NOTE   Pt requires vasopressors, no changes overnight. PICC placed.  EXAM:  BP 107/80   Pulse (!) 107   Temp 99.3 F (37.4 C)   Resp (!) 23   Ht $R'5\' 2"'eu$  (1.575 m)   Wt 66 kg   SpO2 100%   BMI 26.61 kg/m   Pupils fixed/dilated Not breathing over vent (-) corneal (-) cough/gag No motor responses to central noxious stim  IMPRESSION:  46 y.o. female SAH d# 3, remains comatose with no brainstem function. I do not see any meaningful chance of neurologic recovery.   PLAN: - Pts mother coming in today, cont support for now - Will again discuss one-way extubation as patient has likely met threshold for brain death.   Consuella Lose, MD [facility]

## 2021-10-04 NOTE — Progress Notes (Signed)
Did have a conversation with patients family members and spouse at bedside , with other family members on the phone regarding current care, outlook and prognosis, DNR status also discussed.  Consensus was to continue current care,  they would want a second opinion which I let them know is appropriate as they see  fit.  From a medical management perspective, there is no further medical or surgical intervention that will be appropriate with her catastrophic subarachnoid bleed with brainstem compromise and absence of brainstem reflexes.Meets criteria for brain death.  I encouraged them to seek an opinion and let us know if they have an accepting physician

## 2021-10-04 NOTE — Progress Notes (Signed)
Checked with RN to assess for needs.  Based on notes of previous chaplain, family declined support yesterday.  RN agrees for now but will monitor pt and family today for openness to contact by chaplain.    Our department remains available as pt/family needs become evident.    Minus Liberty, [place] [phone]    10/04/21 0900  Clinical Encounter Type  Visited With Health care provider  Visit Type Initial  Referral From  ([place])  Consult/Referral To Chaplain  Stress Factors  Patient Stress Factors Health changes

## 2021-10-04 NOTE — Progress Notes (Signed)
NAME:  MELINNA LINAREZ, MRN:  [id number], DOB:  [birth year], LOS: 2 ADMISSION DATE:  09/30/2021, CONSULTATION DATE: 10/01/2021 REFERRING MD: ED physician, CHIEF COMPLAINT: Subarachnoid hemorrhage  History of Present Illness:  PATRECIA VEIGA is a 46 yo female with hypertension who presented to the ED after being found unresponsive by her husband. The patient was last seen at her baseline last night and this morning, around 6am, the patient's husband noted that she was "breathing funny" and making gurgling noises. He attempted to wake her and she was unresponsive and had his daughter call EMS. He started CPR and upon EMS arrival, the patient was in SVT and they performed DCCV. HR improved, although she had no improvement in her mental status.    GCS 4 on arrival to ED, proceeded with emergent intubation. Large SAH was noted on CTH. WBC 14.5, Hb 15.3. K 3.2, Cr wnl. Lactic 3.6. Neurosurg and PCCM consulted.    No family history of aneurysms known.   Pertinent  Medical History   Hypertension, uterine fibroids Legally blind in left eye Discussed with spouse at bedside No overnight events  Significant Hospital Events: Including procedures, antibiotic start and stop dates in addition to other pertinent events   9/7-presented unresponsive, hypotensive 9/7-CT head with subarachnoid hemorrhage  Interim History / Subjective:  Remains unresponsive No gag, no cough, pupils nonreactive  Objective   Blood pressure 107/80, pulse (!) 107, temperature 99.3 F (37.4 C), resp. rate (!) 23, height '5\' 2"'$  (1.575 m), weight 66 kg, SpO2 100 %.    Vent Mode: PRVC FiO2 (%):  [40 %] 40 % Set Rate:  [24 bmp] 24 bmp Vt Set:  [400 mL] 400 mL PEEP:  [5 cmH20] 5 cmH20 Plateau Pressure:  [16 cmH20-17 cmH20] 17 cmH20   Intake/Output Summary (Last 24 hours) at 10/04/2021 1003 Last data filed at 10/04/2021 0600 Gross per 24 hour  Intake 2061.09 ml  Output 1700 ml  Net 361.09 ml   Filed Weights   10/22/2021 0722  10/25/2021 1700  Weight: 56.7 kg 66 kg    Examination: General: Middle-age, does not appear to be in distress HENT: Moist oral mucosa, endotracheal tube in place Lungs: Clear breath sounds Cardiovascular: S1-S2 appreciated Abdomen: Soft, bowel sounds appreciated Extremities: No clubbing, no edema Neuro: Nonreactive pupils no cough, no gag GU:   Not currently on any sedatives  CT head reviewed by myself CT chest reviewed  Resolved Hospital Problem list    Assessment & Plan:   Subarachnoid hemorrhage H/H5 Brainstem compression Brainstem definitely compromised -Appreciate neurosurgery management and family update -No surgical intervention will be of benefit  Uncontrolled hypertension -Maintain blood pressure -Hold home medications -Maintain blood pressure less than 140  Acute hypoxemic respiratory failure -Continue full ventilator support Target TVol 6-8cc/kgIBW Target Plateau Pressure < 30cm H20 Target driving pressure less than 15 cm of water Ventilator associated pneumonia prevention protocol  Concern for aspiration pneumonia -On Unasyn  Acute kidney injury -Avoid nephrotoxic's -Maintain renal perfusion  Hypernatremia -Monitor  Continue Keppra for seizure prophylaxis  Patient's prognosis remains very poor  Discussed with patient's spouse at bedside Patient's mother will be coming in today  Most appropriate status will be comfort measures  Best Practice (right click and "Reselect all SmartList Selections" daily)   Diet/type: NPO DVT prophylaxis: SCD GI prophylaxis: PPI Lines: N/A Foley:  N/A Code Status:  full code Last date of multidisciplinary goals of care discussion [discussed with family at bedside]  Labs   CBC: Recent  Labs  Lab 10/10/2021 0658 10/17/2021 0708 10/11/2021 0804 10/03/21 0740  WBC 14.5*  --   --  16.7*  NEUTROABS 7.4  --   --   --   HGB 15.3* 16.0* 13.3 15.4*  HCT 45.1 47.0* 39.0 43.9  MCV 90.0  --   --  87.5  PLT 189  --    --  213    Basic Metabolic Panel: Recent Labs  Lab 10/16/2021 0658 10/19/2021 0708 10/04/2021 0804 10/04/2021 2125 10/03/21 0740  NA 135 139 137 154* 153*  K 3.1* 3.2* 2.6* 5.4* 5.4*  CL 103 106  --  129* 122*  CO2 17*  --   --  14* 18*  GLUCOSE 202* 203*  --  127* 128*  BUN 9 8  --  14 17  CREATININE 0.78 0.60  --  1.61* 2.15*  CALCIUM 8.8*  --   --  9.5 9.6  MG 1.8  --   --   --  2.3   GFR: Estimated Creatinine Clearance: 28.9 mL/min (A) (by C-G formula based on SCr of 2.15 mg/dL (H)). Recent Labs  Lab 10/08/2021 0658 10/17/2021 0853 10/03/21 0740  WBC 14.5*  --  16.7*  LATICACIDVEN 3.6* 4.1*  --     Liver Function Tests: Recent Labs  Lab 10/11/2021 0658  AST 64*  ALT 50*  ALKPHOS 41  BILITOT 0.9  PROT 6.7  ALBUMIN 3.6   Recent Labs  Lab 09/26/2021 0658  LIPASE 41   No results for input(s): "AMMONIA" in the last 168 hours.  ABG    Component Value Date/Time   PHART 7.409 10/20/2021 0804   PCO2ART 34.1 10/08/2021 0804   PO2ART 158 (H) 10/19/2021 0804   HCO3 21.8 10/22/2021 0804   TCO2 23 09/28/2021 0804   ACIDBASEDEF 3.0 (H) 10/04/2021 0804   O2SAT 99 10/18/2021 0804     Coagulation Profile: Recent Labs  Lab 10/11/2021 0658  INR 1.2    Cardiac Enzymes: Recent Labs  Lab 10/13/2021 0658  CKTOTAL 221    HbA1C: No results found for: "HGBA1C"  CBG: Recent Labs  Lab 10/11/2021 0657  GLUCAP 195*    Review of Systems:   Unresponsive  Past Medical History:  She,  has a past medical history of Blind left eye, Fibroid, Hypertension, Vaginal Pap smear, abnormal, Visual loss, right eye, and Vitamin D deficiency.   Surgical History:   Past Surgical History:  Procedure Laterality Date   cryotheraphy     EYE SURGERY       Social History:   reports that she has quit smoking. Her smoking use included cigarettes. She smoked an average of .25 packs per day. She has quit using smokeless tobacco. She reports that she does not drink alcohol and does not use  drugs.   Family History:  Her family history includes Breast cancer in her paternal aunt; Heart failure in her father; Hypertension in her father and mother.   Allergies Allergies  Allergen Reactions   Penicillins Hives    Tolerated Unasyn during 09/2021 admission     The patient is critically ill with multiple organ systems failure and requires high complexity decision making for assessment and support, frequent evaluation and titration of therapies, application of advanced monitoring technologies and extensive interpretation of multiple databases. Critical Care Time devoted to patient care services described in this note independent of APP/resident time (if applicable)  is 33 minutes.   Sherrilyn Rist MD [facility] Personal pager: See Shea Evans  If unanswered, please page CCM On-call: [phone]

## 2021-10-04 NOTE — Progress Notes (Signed)
eLink Physician-Brief Progress Note Patient Name: [name] DOB: 1975-07-14 MRN: [id number]   Date of Service  10/04/2021  HPI/Events of Note  Notified of marginal urine output. She had been given a 1L bolus earlier with adequate response.  Sodium has risen to 159  eICU Interventions  Placed on NS maintenance IVF (which is still hypotonic relative to pt) Will continue to monitor I/O closely.  Trend Na.         [place] 10/04/2021, 10:11 PM

## 2021-10-04 NOTE — Progress Notes (Signed)
Peripherally Inserted Central Catheter Placement  The IV Nurse has discussed with the patient and/or persons authorized to consent for the patient, the purpose of this procedure and the potential benefits and risks involved with this procedure.  The benefits include less needle sticks, lab draws from the catheter, and the patient may be discharged home with the catheter. Risks include, but not limited to, infection, bleeding, blood clot (thrombus formation), and puncture of an artery; nerve damage and irregular heartbeat and possibility to perform a PICC exchange if needed/ordered by physician.  Alternatives to this procedure were also discussed.  Bard Power PICC patient education guide, fact sheet on infection prevention and patient information card has been provided to patient /or left at bedside.    PICC Placement Documentation  PICC Triple Lumen 68/12/75 Right Basilic 40 cm 0 cm (Active)  Indication for Insertion or Continuance of Line Vasoactive infusions 10/04/21 0158  Exposed Catheter (cm) 0 cm 10/04/21 0158  Site Assessment Clean, Dry, Intact 10/04/21 0158  Lumen #1 Status Flushed;Blood return noted;Saline locked 10/04/21 0158  Lumen #2 Status Flushed;Blood return noted;Saline locked 10/04/21 0158  Lumen #3 Status Flushed;Blood return noted;Saline locked 10/04/21 0158  Dressing Type Transparent;Securing device 10/04/21 0158  Dressing Status Antimicrobial disc in place;Clean, Dry, Intact 10/04/21 0158  Safety Lock Not Applicable 17/00/17 4944  Line Care Connections checked and tightened 10/04/21 0158  Line Adjustment (NICU/IV Team Only) No 10/04/21 0158  Dressing Intervention New dressing 10/04/21 0001  Dressing Change Due 10/10/21 10/04/21 0158       Isael Stille, Cathlyn Parsons 10/04/2021, 2:01 AM

## 2021-10-05 DIAGNOSIS — I609 Nontraumatic subarachnoid hemorrhage, unspecified: Secondary | ICD-10-CM | POA: Diagnosis not present

## 2021-10-05 LAB — GLUCOSE, CAPILLARY
Glucose-Capillary: 102 mg/dL — ABNORMAL HIGH (ref 70–99)
Glucose-Capillary: 101 mg/dL — ABNORMAL HIGH (ref 70–99)

## 2021-10-05 LAB — BASIC METABOLIC PANEL
BUN: 15 mg/dL (ref 6–20)
CO2: 20 mmol/L — ABNORMAL LOW (ref 22–32)
Calcium: 9 mg/dL (ref 8.9–10.3)
Chloride: >130 mmol/L (ref 98–111)
Creatinine, Ser: 3.62 mg/dL — ABNORMAL HIGH (ref 0.44–1.00)
GFR, Estimated: 15 mL/min — ABNORMAL LOW (ref >60)
Glucose, Bld: 98 mg/dL (ref 70–99)
Potassium: 3.3 mmol/L — ABNORMAL LOW (ref 3.5–5.1)
Sodium: 165 mmol/L (ref 135–145)

## 2021-10-05 LAB — PHOSPHORUS: Phosphorus: 1.3 mg/dL — ABNORMAL LOW (ref 2.5–4.6)

## 2021-10-05 LAB — MAGNESIUM: Magnesium: 1.7 mg/dL (ref 1.7–2.4)

## 2021-10-05 MED ORDER — FREE WATER
200.0000 mL | Status: DC
  Administered 2021-10-05 – 2021-10-08 (×27): 200 mL

## 2021-10-05 MED ORDER — LACTATED RINGERS IV BOLUS
500.0000 mL | Freq: Once | INTRAVENOUS | Status: AC
  Administered 2021-10-05: 500 mL via INTRAVENOUS

## 2021-10-05 MED ORDER — VITAL HIGH PROTEIN PO LIQD
1000.0000 mL | ORAL | Status: DC
  Administered 2021-10-05 – 2021-10-06 (×3): 1000 mL

## 2021-10-05 MED ORDER — PROSOURCE TF20 ENFIT COMPATIBL EN LIQD
60.0000 mL | Freq: Every day | ENTERAL | Status: DC
  Administered 2021-10-05 – 2021-12-31 (×84): 60 mL
  Filled 2021-10-05 (×84): qty 60

## 2021-10-05 MED ORDER — FREE WATER
200.0000 mL | Status: DC
Start: 2021-10-05 — End: 2021-10-05
  Administered 2021-10-05 (×2): 200 mL

## 2021-10-05 MED ORDER — POTASSIUM CHLORIDE 10 MEQ/50ML IV SOLN: 10.0000 meq | INTRAVENOUS | Status: DC

## 2021-10-05 MED ORDER — POTASSIUM CHLORIDE 10 MEQ/50ML IV SOLN
10.0000 meq | INTRAVENOUS | Status: AC
  Administered 2021-10-05 (×2): 10 meq via INTRAVENOUS
  Filled 2021-10-05 (×2): qty 50

## 2021-10-05 MED ORDER — LACTATED RINGERS IV SOLN: INTRAVENOUS | Status: DC

## 2021-10-05 NOTE — Progress Notes (Signed)
  NEUROSURGERY PROGRESS NOTE   Pt requires vasopressors, no changes overnight. UO decreased.  EXAM:  BP 132/89   Pulse 96   Temp (!) 97.5 F (36.4 C)   Resp (!) 22   Ht _0  (1.575 m)   Wt 66 kg   SpO2 100%   BMI 26.61 kg/m   Pupils fixed/dilated Not breathing over vent (-) corneal (-) cough/gag No motor responses to central noxious stim  IMPRESSION:  46 y.o. female SAH d# 4, remains comatose with no brainstem function. Has likely met criteria for brain-death but have not formalized testing yet.  PLAN: - Will cont current mgmt, pts mother to arrive from [place] with family at bedside and mother on phone with Dr. Ander Slade. Again reviewed situation with no meaningful chance of neurologic recovery. All questions answered.    Consuella Lose, MD [facility]

## 2021-10-05 NOTE — Progress Notes (Addendum)
eLink Physician-Brief Progress Note Patient Name: [name] DOB: [birth year] MRN: [id number]   Date of Service  10/05/2021  HPI/Events of Note  Notified of hypernatremia with Na 165, chloride >130.  K 3.3, Crea 3.62.  Pt was getting LR infusion.   Free water deficit is about 2.1L to improve Na by 10 points.   eICU Interventions  Discontinue LR.  Increase free water flushes to 224m q2hr.  Replete K - 174m KCL x 2 ordered.     Intervention Category Intermediate Interventions: Electrolyte abnormality - evaluation and management  VaElsie Lincoln/10/2021, 7:49 PM  6:30 AM Notified of rising Na despite double in free water.  K is also down to 3.0 from 3.3 with K repletion.   Blood sample drawn from PICC line.   Plan> Redraw blood sample from peripheral stick.

## 2021-10-05 NOTE — Progress Notes (Signed)
NAME:  [name], MRN:  [id number], DOB:  10-12-1975, LOS: 3 ADMISSION DATE:  09/29/2021, CONSULTATION DATE: 10/15/2021 REFERRING MD: ED physician, CHIEF COMPLAINT: Subarachnoid hemorrhage  History of Present Illness:  [name] is a 46 yo female with hypertension who presented to the ED after being found unresponsive by her husband. The patient was last seen at her baseline last night and this morning, around 6am, the patient's husband noted that she was "breathing funny" and making gurgling noises. He attempted to wake her and she was unresponsive and had his daughter call EMS. He started CPR and upon EMS arrival, the patient was in SVT and they performed DCCV. HR improved, although she had no improvement in her mental status.    GCS 4 on arrival to ED, proceeded with emergent intubation. Large SAH was noted on CTH. WBC 14.5, Hb 15.3. K 3.2, Cr wnl. Lactic 3.6. Neurosurg and PCCM consulted.    No family history of aneurysms known.   Pertinent  Medical History   Hypertension, uterine fibroids Legally blind in left eye Discussed with spouse at bedside No overnight events  Significant Hospital Events: Including procedures, antibiotic start and stop dates in addition to other pertinent events   9/7-presented unresponsive, hypotensive 9/7-CT head with subarachnoid hemorrhage  Interim History / Subjective:  Remains completely unresponsive No corneals, pupils dilated nonreactive, no cough, no gag, no response to noxious stimuli, breathing with the ventilator  Objective   Blood pressure 126/82, pulse 95, temperature (!) 97.5 F (36.4 C), resp. rate (!) 24, height '5\' 2"'$  (1.575 m), weight 66 kg, SpO2 100 %.    Vent Mode: PRVC FiO2 (%):  [40 %] 40 % Set Rate:  [24 bmp] 24 bmp Vt Set:  [400 mL] 400 mL PEEP:  [5 cmH20] 5 cmH20 Plateau Pressure:  [1 cmH20-17 cmH20] 17 cmH20   Intake/Output Summary (Last 24 hours) at 10/05/2021 1124 Last data filed at 10/05/2021 0700 Gross per 24  hour  Intake 1987.06 ml  Output 1450 ml  Net 537.06 ml   Filed Weights   10/16/2021 0722 10/23/2021 1700  Weight: 56.7 kg 66 kg    Examination: General: Middle-age, does not appear to be in distress HENT: Moist oral mucosa, endotracheal tube in place Lungs: Clear breath sounds bilaterally Cardiovascular: S1-S2 appreciated Abdomen: Soft, bowel sounds appreciated Extremities: No clubbing, no edema Neuro: No gag, no corneals, no cough, nonreactive pupils GU:   Not currently on any sedatives  CT head reviewed by myself CT chest reviewed  Resolved Hospital Problem list    Assessment & Plan:   Catastrophic subarachnoid hemorrhage H/H5 Brainstem compression -Neurosurgery continues to follow and discussion with the family was had by myself Dr. Kathyrn Sheriff   Uncontrolled hypertension -Maintain blood pressure -Hold home medications -Maintain blood pressure less than 140  Acute hypoxemic respiratory failure -Continue full ventilator support Target TVol 6-8cc/kgIBW Target Plateau Pressure < 30cm H20 Target driving pressure less than 15 cm of water Ventilator associated pneumonia prevention protocol  Concern for aspiration -On Unasyn  Acute kidney injury -Continue to monitor closely -Avoid nephrotoxic agents  Hypernatremia -Free water  Continue Keppra for seizure prophylaxis  Patient's prognosis remains very poor  Most appropriate status will be comfort measures  Family is seeking a second opinion A second opinion by an uninvolved physician was offered by not Dr. Kathyrn Sheriff as well Patient's mother stated she will be coming in tomorrow and at present to continue all lines of care including active resuscitation  Best  Practice (right click and "Reselect all SmartList Selections" daily)   Diet/type: NPO DVT prophylaxis: SCD GI prophylaxis: PPI Lines: N/A Foley:  N/A Code Status:  full code Last date of multidisciplinary goals of care discussion [discussed with family  at bedside]  Labs   CBC: Recent Labs  Lab 09/28/2021 0658 10/15/2021 0708 09/30/2021 0804 10/03/21 0740 10/04/21 1217  WBC 14.5*  --   --  16.7* 11.8*  NEUTROABS 7.4  --   --   --  8.7*  HGB 15.3* 16.0* 13.3 15.4* 12.7  HCT 45.1 47.0* 39.0 43.9 36.7  MCV 90.0  --   --  87.5 89.5  PLT 189  --   --  159 103*    Basic Metabolic Panel: Recent Labs  Lab 10/19/2021 0658 09/28/2021 0708 10/21/2021 0804 10/08/2021 2125 10/03/21 0740 10/04/21 1217  NA 135 139 137 154* 153* 159*  K 3.1* 3.2* 2.6* 5.4* 5.4* 3.5  CL 103 106  --  129* 122* 128*  CO2 17*  --   --  14* 18* 19*  GLUCOSE 202* 203*  --  127* 128* 122*  BUN 9 8  --  '14 17 16  '$ CREATININE 0.78 0.60  --  1.61* 2.15* 3.42*  CALCIUM 8.8*  --   --  9.5 9.6 9.2  MG 1.8  --   --   --  2.3  --    GFR: Estimated Creatinine Clearance: 18.1 mL/min (A) (by C-G formula based on SCr of 3.42 mg/dL (H)). Recent Labs  Lab 10/03/2021 0658 10/17/2021 0853 10/03/21 0740 10/04/21 1217  WBC 14.5*  --  16.7* 11.8*  LATICACIDVEN 3.6* 4.1*  --   --     Liver Function Tests: Recent Labs  Lab 10/05/2021 0658  AST 64*  ALT 50*  ALKPHOS 41  BILITOT 0.9  PROT 6.7  ALBUMIN 3.6   Recent Labs  Lab 10/12/2021 0658  LIPASE 41   No results for input(s): "AMMONIA" in the last 168 hours.  ABG    Component Value Date/Time   PHART 7.409 10/20/2021 0804   PCO2ART 34.1 09/26/2021 0804   PO2ART 158 (H) 09/27/2021 0804   HCO3 21.8 10/25/2021 0804   TCO2 23 09/27/2021 0804   ACIDBASEDEF 3.0 (H) 10/15/2021 0804   O2SAT 99 10/07/2021 0804     Coagulation Profile: Recent Labs  Lab 10/24/2021 0658  INR 1.2    Cardiac Enzymes: Recent Labs  Lab 10/08/2021 0658  CKTOTAL 221    HbA1C: No results found for: "HGBA1C"  CBG: Recent Labs  Lab 10/22/2021 0657 10/04/21 2008  GLUCAP 195* 123*    Review of Systems:   Unresponsive  Past Medical History:  She,  has a past medical history of Blind left eye, Fibroid, Hypertension, Vaginal Pap smear,  abnormal, Visual loss, right eye, and Vitamin D deficiency.   Surgical History:   Past Surgical History:  Procedure Laterality Date   cryotheraphy     EYE SURGERY       Social History:   reports that she has quit smoking. Her smoking use included cigarettes. She smoked an average of .25 packs per day. She has quit using smokeless tobacco. She reports that she does not drink alcohol and does not use drugs.   Family History:  Her family history includes Breast cancer in her paternal aunt; Heart failure in her father; Hypertension in her father and mother.   Allergies Allergies  Allergen Reactions   Penicillins Hives    Tolerated Unasyn during  09/2021 admission    The patient is critically ill with multiple organ systems failure and requires high complexity decision making for assessment and support, frequent evaluation and titration of therapies, application of advanced monitoring technologies and extensive interpretation of multiple databases. Critical Care Time devoted to patient care services described in this note independent of APP/resident time (if applicable)  is 33 minutes.   Sherrilyn Rist MD [facility] Personal pager: See [place] If unanswered, please page CCM On-call: [phone]

## 2021-10-06 ENCOUNTER — Inpatient Hospital Stay (HOSPITAL_COMMUNITY): Payer: Medicare Other

## 2021-10-06 DIAGNOSIS — I609 Nontraumatic subarachnoid hemorrhage, unspecified: Secondary | ICD-10-CM | POA: Diagnosis not present

## 2021-10-06 LAB — PHOSPHORUS
Phosphorus: 1.1 mg/dL — ABNORMAL LOW (ref 2.5–4.6)
Phosphorus: 2.2 mg/dL — ABNORMAL LOW (ref 2.5–4.6)

## 2021-10-06 LAB — BASIC METABOLIC PANEL
BUN: 20 mg/dL (ref 6–20)
BUN: 21 mg/dL — ABNORMAL HIGH (ref 6–20)
BUN: 23 mg/dL — ABNORMAL HIGH (ref 6–20)
CO2: 19 mmol/L — ABNORMAL LOW (ref 22–32)
CO2: 19 mmol/L — ABNORMAL LOW (ref 22–32)
CO2: 20 mmol/L — ABNORMAL LOW (ref 22–32)
Calcium: 9.3 mg/dL (ref 8.9–10.3)
Calcium: 9.5 mg/dL (ref 8.9–10.3)
Calcium: 9.3 mg/dL (ref 8.9–10.3)
Chloride: >130 mmol/L (ref 98–111)
Chloride: >130 mmol/L (ref 98–111)
Chloride: >130 mmol/L (ref 98–111)
Creatinine, Ser: 3.54 mg/dL — ABNORMAL HIGH (ref 0.44–1.00)
Creatinine, Ser: 3.59 mg/dL — ABNORMAL HIGH (ref 0.44–1.00)
Creatinine, Ser: 3.56 mg/dL — ABNORMAL HIGH (ref 0.44–1.00)
GFR, Estimated: 15 mL/min — ABNORMAL LOW (ref >60)
GFR, Estimated: 15 mL/min — ABNORMAL LOW (ref >60)
GFR, Estimated: 15 mL/min — ABNORMAL LOW (ref >60)
Glucose, Bld: 117 mg/dL — ABNORMAL HIGH (ref 70–99)
Glucose, Bld: 120 mg/dL — ABNORMAL HIGH (ref 70–99)
Glucose, Bld: 137 mg/dL — ABNORMAL HIGH (ref 70–99)
Potassium: 3 mmol/L — ABNORMAL LOW (ref 3.5–5.1)
Potassium: 3.1 mmol/L — ABNORMAL LOW (ref 3.5–5.1)
Potassium: 3.1 mmol/L — ABNORMAL LOW (ref 3.5–5.1)
Sodium: 166 mmol/L (ref 135–145)
Sodium: 167 mmol/L (ref 135–145)
Sodium: 169 mmol/L (ref 135–145)

## 2021-10-06 LAB — OSMOLALITY, URINE
Osmolality, Ur: 124 mOsm/kg — ABNORMAL LOW (ref 300–900)
Osmolality, Ur: 362 mOsm/kg (ref 300–900)

## 2021-10-06 LAB — GLUCOSE, CAPILLARY
Glucose-Capillary: 110 mg/dL — ABNORMAL HIGH (ref 70–99)
Glucose-Capillary: 113 mg/dL — ABNORMAL HIGH (ref 70–99)
Glucose-Capillary: 125 mg/dL — ABNORMAL HIGH (ref 70–99)
Glucose-Capillary: 128 mg/dL — ABNORMAL HIGH (ref 70–99)
Glucose-Capillary: 191 mg/dL — ABNORMAL HIGH (ref 70–99)
Glucose-Capillary: 211 mg/dL — ABNORMAL HIGH (ref 70–99)

## 2021-10-06 LAB — MAGNESIUM
Magnesium: 1.9 mg/dL (ref 1.7–2.4)
Magnesium: 2 mg/dL (ref 1.7–2.4)

## 2021-10-06 LAB — SODIUM: Sodium: 156 mmol/L — ABNORMAL HIGH (ref 135–145)

## 2021-10-06 LAB — OSMOLALITY: Osmolality: 357 mOsm/kg (ref 275–295)

## 2021-10-06 MED ORDER — DEXTROSE 5 % IV SOLN: INTRAVENOUS | Status: DC

## 2021-10-06 MED ORDER — SODIUM CHLORIDE 0.9 % IV SOLN
1.0000 g | INTRAVENOUS | Status: AC
  Administered 2021-10-06 – 2021-10-08 (×3): 1 g via INTRAVENOUS
  Filled 2021-10-06 (×3): qty 10

## 2021-10-06 MED ORDER — FENTANYL CITRATE PF 50 MCG/ML IJ SOSY
50.0000 ug | PREFILLED_SYRINGE | INTRAMUSCULAR | Status: DC | PRN
  Filled 2021-10-06: qty 1

## 2021-10-06 MED ORDER — DESMOPRESSIN ACETATE 4 MCG/ML IJ SOLN
4.0000 ug | Freq: Once | INTRAMUSCULAR | Status: AC
  Administered 2021-10-06: 4 ug via INTRAVENOUS
  Filled 2021-10-06: qty 1

## 2021-10-06 MED ORDER — FENTANYL CITRATE PF 50 MCG/ML IJ SOSY: 50.0000 ug | PREFILLED_SYRINGE | INTRAMUSCULAR | Status: DC | PRN

## 2021-10-06 MED ORDER — METRONIDAZOLE 500 MG/100ML IV SOLN
500.0000 mg | Freq: Two times a day (BID) | INTRAVENOUS | Status: AC
  Administered 2021-10-06 – 2021-10-08 (×5): 500 mg via INTRAVENOUS
  Filled 2021-10-06 (×5): qty 100

## 2021-10-06 MED ORDER — POTASSIUM CHLORIDE 10 MEQ/50ML IV SOLN
10.0000 meq | INTRAVENOUS | Status: AC
  Administered 2021-10-06 (×2): 10 meq via INTRAVENOUS
  Filled 2021-10-06 (×2): qty 50

## 2021-10-06 MED ORDER — POTASSIUM PHOSPHATES 15 MMOLE/5ML IV SOLN
15.0000 mmol | Freq: Once | INTRAVENOUS | Status: AC
  Administered 2021-10-06: 15 mmol via INTRAVENOUS
  Filled 2021-10-06: qty 5

## 2021-10-06 NOTE — Progress Notes (Signed)
  NEUROSURGERY PROGRESS NOTE   Pt cont to require vasopressors, no changes overnight. UO decreased, hypernatremic  EXAM:  BP 130/81   Pulse 91   Temp 98.1 F (36.7 C)   Resp (!) 24   Ht _0  (1.575 m)   Wt 66 kg   SpO2 100%   BMI 26.61 kg/m   Pupils fixed/dilated Not breathing over vent (-) corneal (-) cough/gag No motor responses to central noxious stim  IMPRESSION:  46 y.o. female SAH d# 5, remains comatose with no brainstem function. Has likely met criteria for brain-death but have not formalized testing yet. - hypernatremia likely due to central DI from severe brain injury  PLAN: - Will cont current mgmt, pts mother to arrive from [place] today to further discuss goals of care. Consensus medical opinion is that there is no meaningful chance of recovery    Consuella Lose, MD [facility]

## 2021-10-06 NOTE — Progress Notes (Signed)
Patient ID: [name], female   DOB: 1975/10/26, 46 y.o.   MRN: [id number] Head ct reviewed. Aca infarcts, brain stem infarcts, pca territory changes bilaterally Basal cisterns and ventricles obliterated Scan with progressive signs of further damage to cerebrum.

## 2021-10-06 NOTE — Progress Notes (Signed)
NAME:  [name], MRN:  [id number], DOB:  July 28, 1975, LOS: 4 ADMISSION DATE:  10/15/2021, CONSULTATION DATE: 09/27/2021 REFERRING MD: ED physician, CHIEF COMPLAINT: Subarachnoid hemorrhage  History of Present Illness:  [name] is a 46 yo female with hypertension who presented to the ED after being found unresponsive by her husband. The patient was last seen at her baseline last night and this morning, around 6am, the patient's husband noted that she was "breathing funny" and making gurgling noises. He attempted to wake her and she was unresponsive and had his daughter call EMS. He started CPR and upon EMS arrival, the patient was in SVT and they performed DCCV. HR improved, although she had no improvement in her mental status.    GCS 4 on arrival to ED, proceeded with emergent intubation. Large SAH was noted on CTH. WBC 14.5, Hb 15.3. K 3.2, Cr wnl. Lactic 3.6. Neurosurg and PCCM consulted.    No family history of aneurysms known.   Pertinent  Medical History   Hypertension, uterine fibroids Legally blind in left eye Discussed with spouse at bedside No overnight events  Significant Hospital Events: Including procedures, antibiotic start and stop dates in addition to other pertinent events   9/7-presented unresponsive, hypotensive 9/7-CT head with subarachnoid hemorrhage  Interim History / Subjective:   Remains unresponsive, mental status unchanged with no reflexes  Objective   Blood pressure 107/78, pulse 91, temperature 97.9 F (36.6 C), resp. rate (!) 24, height '5\' 2"'$  (1.575 m), weight 66 kg, SpO2 100 %.    Vent Mode: PRVC FiO2 (%):  [40 %] 40 % Set Rate:  [24 bmp] 24 bmp Vt Set:  [400 mL] 400 mL PEEP:  [5 cmH20] 5 cmH20 Plateau Pressure:  [16 cmH20-17 cmH20] 16 cmH20   Intake/Output Summary (Last 24 hours) at 10/06/2021 0805 Last data filed at 10/06/2021 0600 Gross per 24 hour  Intake 2977.09 ml  Output 1800 ml  Net 1177.09 ml   Filed Weights   10/20/2021 0722  10/16/2021 1700 10/06/21 0500  Weight: 56.7 kg 66 kg 66 kg    Examination: Blood pressure 107/78, pulse 91, temperature 97.9 F (36.6 C), resp. rate (!) 24, height '5\' 2"'$  (1.575 m), weight 66 kg, SpO2 100 %. Gen:      No acute distress HEENT:  EOMI, sclera anicteric Neck:     No masses; no thyromegaly, ET tube Lungs:    Clear to auscultation bilaterally; normal respiratory effort CV:         Regular rate and rhythm; no murmurs Abd:      + bowel sounds; soft, non-tender; no palpable masses, no distension Ext:    No edema; adequate peripheral perfusion Skin:      Warm and dry; no rash Neuro: Comatose, no gag, corneal or cough reflex.  Pupils dilated and fixed.  Labs/imaging reviewed Significant for sodium 166, potassium 3, chloride greater than 130, creatinine 3.54 No new imaging  Resolved Hospital Problem list    Assessment & Plan:  Catastrophic subarachnoid hemorrhage H/H5 Has no reflexes suggestive of brain death. Keppra for seizure prophylaxis  Hypotension Holding home blood pressure medication Continue norepinephrine for goal SBP 120-1 40  Acute hypoxemic respiratory failure Concern for aspiration Continue full vent support.  No weans as mental status is poor On Unasyn day 5/7 for aspiration.  Acute kidney injury Hypophosphatemia, hypokalemia Monitor urine output and creatinine Replete electrolytes  Hypernatremia.  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  Checking urine and serum osmolarity  Protein calorie malnutrition Started tube feeds on 9/10  Goals of care Ongoing discussion with family regarding goals of care.  Mother is to arrive from [place] today Family is seeking a second opinion Patient's prognosis remains very poor  Best Practice (right click and "Reselect all SmartList Selections" daily)   Diet/type: tubefeeds DVT prophylaxis: SCD GI prophylaxis: PPI Lines: Central line Foley:  N/A Code Status:   full code Last date of multidisciplinary goals of care discussion [discussed with family at bedside]  Signature:   The patient is critically ill with multiple organ system failure and requires high complexity decision making for assessment and support, frequent evaluation and titration of therapies, advanced monitoring, review of radiographic studies and interpretation of complex data.   Critical Care Time devoted to patient care services, exclusive of separately billable procedures, described in this note is 35 minutes.   Marshell Garfinkel MD [facility] See Amion for pager  If no response to pager , please call [phone] until 7pm After 7:00 pm call Elink  [phone] 10/06/2021, 8:05 AM

## 2021-10-06 NOTE — Progress Notes (Signed)
Pt transported to from 4N19 to CT 4 with RN and charge nurse at bedside. No complications at this time. RT will continue to monitor.

## 2021-10-06 NOTE — Progress Notes (Signed)
eLink Physician-Brief Progress Note Patient Name: [name] DOB: 1975-03-03 MRN: [id number]   Date of Service  10/06/2021  HPI/Events of Note  Notified of Na at 156 <-- 169.  Pt was given ddavp, D5W'@200cc'$ /hr and free water boluses.  eICU Interventions  Pt with acute hypernatremia and should be ok to lower Na fast.  Decrease D5W to 100cc/hr.  Repeat serum Na in 4 hours.      Intervention Category Intermediate Interventions: Electrolyte abnormality - evaluation and management  Elsie Lincoln 10/06/2021, 10:36 PM  2:19 AM Notified of Na at 157, K 2.4, Mg 1.6, chloride >130, crea 2.97.   CT head ordered by neurosurgery tonight- Diffuse subarachnoid hemorrhage with downward transtentorial herniation of the brainstem and downward herniation of the cerebellar tonsils through the foramen magnum.  Plan> Replete K, Mg.  Keep D5W'@100cc'$ /hr. Start on insulin sliding scale Q4hrs.   6:47 AM Na noted at 159.    Plan> Increase back D5W to 200cc/hr.  Continue to follow serum Na.

## 2021-10-06 NOTE — Progress Notes (Signed)
Dr. Christella Noa was at bedside to view Head CT results and discussed the results w/ pt husband whom is at bedside.

## 2021-10-06 NOTE — Progress Notes (Signed)
PT's husband informed me that they did want to pursue a transfer either to [facility] or [facility] essentially for 2nd opinion. Dr. Kathyrn Sheriff, [facility] were informed as well as Ellan Lambert- Case Manager. I conveyed to family the conversations that I had with participants that responsibility of finding an accepting physician would be theirs per Dr. Kathyrn Sheriff. They also contacted Patient relations for a grievance. I also informed Agricultural consultant and Maudie Mercury the [employer] to inform them of situation. The family had [facility] transfer line on speaker phone trying to consult with them. [facility] stated that they needed the physician to call to arrange the transfer. I introduced myself on the call to ensure that they were aware that this was a 2nd opinion transfer for family preference. Their guidance was still a physician need to initiate transfer. Family is reasonable but seem to be becoming more frustrated with situation.

## 2021-10-06 NOTE — Progress Notes (Signed)
Patient ID: [name], female   DOB: 1975-04-07, 46 y.o.   MRN: [id number] BP 133/83   Pulse 83   Temp (!) 97.5 F (36.4 C)   Resp (!) 24   Ht '5\' 2"'$  (1.575 m)   Wt 66 kg   SpO2 100%   BMI 26.61 kg/m  Spoke with family, including mother and father this evening. I have explained her poor prognosis, and the reasons why.  Will order a head CT to see if there is any change. No change in her exam.  Pupils fixed and dilated. No  corneal, cough, gag, no spontaneous breathing, nor response to noxious stimuli.

## 2021-10-06 NOTE — Progress Notes (Signed)
PCCM note  Na elevated to 169. Urine and serum osm is consistent with central DI Starting desmopressin, increase D5W to 200cc/hr Follow q4 Na  Marshell Garfinkel MD [facility] See Amion for pager  If no response to pager , please call [phone] until 7pm After 7:00 pm call Elink  [phone] 10/06/2021, 6:42 PM

## 2021-10-06 NOTE — Progress Notes (Signed)
DDAVP Monitoring:  * DDAVP 4 mcg IV for acute neurogenic DI   * Baseline Na: 169  * Na after DDAVP - will check 6 hours post DDAVP  Monitoring Parameters : Criteria for additional dose: UOP does not respond, urine osmolality <300 mosm/kg at 6 hrs after DDAVP, and serum Na would not decrease by 10-34mq/L in 24 hr period with additional dose of DDAVP.  If first dose of DDAVP did not illicit appropriate response and was not at top of appropriate range consider increasing repeat dose (dose range: 0.5-430m)    CaSherlon HandingPharmD, BCPS Please see amion for complete clinical pharmacist phone list 10/06/2021 6:52 PM

## 2021-10-07 DIAGNOSIS — I609 Nontraumatic subarachnoid hemorrhage, unspecified: Secondary | ICD-10-CM | POA: Diagnosis not present

## 2021-10-07 LAB — CULTURE, BLOOD (ROUTINE X 2)
Culture: NO GROWTH
Culture: NO GROWTH
Special Requests: ADEQUATE
Special Requests: ADEQUATE

## 2021-10-07 LAB — GLUCOSE, CAPILLARY
Glucose-Capillary: 146 mg/dL — ABNORMAL HIGH (ref 70–99)
Glucose-Capillary: 143 mg/dL — ABNORMAL HIGH (ref 70–99)
Glucose-Capillary: 154 mg/dL — ABNORMAL HIGH (ref 70–99)
Glucose-Capillary: 180 mg/dL — ABNORMAL HIGH (ref 70–99)
Glucose-Capillary: 204 mg/dL — ABNORMAL HIGH (ref 70–99)
Glucose-Capillary: 230 mg/dL — ABNORMAL HIGH (ref 70–99)

## 2021-10-07 LAB — BASIC METABOLIC PANEL
BUN: 24 mg/dL — ABNORMAL HIGH (ref 6–20)
BUN: 23 mg/dL — ABNORMAL HIGH (ref 6–20)
CO2: 19 mmol/L — ABNORMAL LOW (ref 22–32)
CO2: 19 mmol/L — ABNORMAL LOW (ref 22–32)
Calcium: 8.7 mg/dL — ABNORMAL LOW (ref 8.9–10.3)
Calcium: 8.6 mg/dL — ABNORMAL LOW (ref 8.9–10.3)
Chloride: >130 mmol/L (ref 98–111)
Chloride: >130 mmol/L (ref 98–111)
Creatinine, Ser: 2.97 mg/dL — ABNORMAL HIGH (ref 0.44–1.00)
Creatinine, Ser: 2.97 mg/dL — ABNORMAL HIGH (ref 0.44–1.00)
GFR, Estimated: 19 mL/min — ABNORMAL LOW (ref >60)
GFR, Estimated: 19 mL/min — ABNORMAL LOW (ref >60)
Glucose, Bld: 327 mg/dL — ABNORMAL HIGH (ref 70–99)
Glucose, Bld: 167 mg/dL — ABNORMAL HIGH (ref 70–99)
Potassium: 2.4 mmol/L — CL (ref 3.5–5.1)
Potassium: 3.3 mmol/L — ABNORMAL LOW (ref 3.5–5.1)
Sodium: 157 mmol/L — ABNORMAL HIGH (ref 135–145)
Sodium: 157 mmol/L — ABNORMAL HIGH (ref 135–145)

## 2021-10-07 LAB — PHOSPHORUS: Phosphorus: 3.3 mg/dL (ref 2.5–4.6)

## 2021-10-07 LAB — MAGNESIUM: Magnesium: 1.6 mg/dL — ABNORMAL LOW (ref 1.7–2.4)

## 2021-10-07 LAB — SODIUM
Sodium: 159 mmol/L — ABNORMAL HIGH (ref 135–145)
Sodium: 158 mmol/L — ABNORMAL HIGH (ref 135–145)
Sodium: 155 mmol/L — ABNORMAL HIGH (ref 135–145)

## 2021-10-07 LAB — OSMOLALITY, URINE: Osmolality, Ur: 372 mOsm/kg (ref 300–900)

## 2021-10-07 MED ORDER — VITAL HIGH PROTEIN PO LIQD: 1000.0000 mL | ORAL | Status: AC

## 2021-10-07 MED ORDER — LEVETIRACETAM 100 MG/ML PO SOLN
500.0000 mg | Freq: Two times a day (BID) | ORAL | Status: DC
  Administered 2021-10-07 – 2021-10-10 (×7): 500 mg
  Filled 2021-10-07 (×8): qty 5

## 2021-10-07 MED ORDER — OSMOLITE 1.5 CAL PO LIQD
1000.0000 mL | ORAL | Status: DC
  Administered 2021-10-07 – 2021-12-27 (×69): 1000 mL
  Filled 2021-10-07 (×16): qty 1000

## 2021-10-07 MED ORDER — MAGNESIUM SULFATE 2 GM/50ML IV SOLN
2.0000 g | Freq: Once | INTRAVENOUS | Status: AC
  Administered 2021-10-07: 2 g via INTRAVENOUS
  Filled 2021-10-07: qty 50

## 2021-10-07 MED ORDER — POTASSIUM CHLORIDE 20 MEQ PO PACK
40.0000 meq | PACK | Freq: Once | ORAL | Status: AC
  Administered 2021-10-07: 40 meq
  Filled 2021-10-07: qty 2

## 2021-10-07 MED ORDER — VITAL HIGH PROTEIN PO LIQD: 1000.0000 mL | ORAL | Status: DC

## 2021-10-07 MED ORDER — POTASSIUM CHLORIDE 10 MEQ/50ML IV SOLN
10.0000 meq | INTRAVENOUS | Status: AC
  Administered 2021-10-07 (×4): 10 meq via INTRAVENOUS
  Filled 2021-10-07 (×4): qty 50

## 2021-10-07 MED ORDER — POLYETHYLENE GLYCOL 3350 17 G PO PACK
17.0000 g | PACK | Freq: Two times a day (BID) | ORAL | Status: DC
  Administered 2021-10-07 – 2021-10-08 (×3): 17 g
  Filled 2021-10-07 (×4): qty 1

## 2021-10-07 MED ORDER — POTASSIUM CHLORIDE 20 MEQ PO PACK
40.0000 meq | PACK | Freq: Two times a day (BID) | ORAL | Status: AC
  Administered 2021-10-07 (×2): 40 meq
  Filled 2021-10-07 (×2): qty 2

## 2021-10-07 MED ORDER — INSULIN ASPART 100 UNIT/ML IJ SOLN
2.0000 [IU] | INTRAMUSCULAR | Status: DC
  Administered 2021-10-07: 4 [IU] via SUBCUTANEOUS
  Administered 2021-10-07: 6 [IU] via SUBCUTANEOUS
  Administered 2021-10-07: 2 [IU] via SUBCUTANEOUS
  Administered 2021-10-07: 4 [IU] via SUBCUTANEOUS
  Administered 2021-10-07: 2 [IU] via SUBCUTANEOUS
  Administered 2021-10-08: 6 [IU] via SUBCUTANEOUS
  Administered 2021-10-08 (×2): 4 [IU] via SUBCUTANEOUS
  Administered 2021-10-08: 6 [IU] via SUBCUTANEOUS
  Administered 2021-10-08 – 2021-10-09 (×2): 4 [IU] via SUBCUTANEOUS
  Administered 2021-10-09: 6 [IU] via SUBCUTANEOUS
  Administered 2021-10-09 (×2): 4 [IU] via SUBCUTANEOUS
  Administered 2021-10-09: 6 [IU] via SUBCUTANEOUS
  Administered 2021-10-09: 2 [IU] via SUBCUTANEOUS
  Administered 2021-10-10: 6 [IU] via SUBCUTANEOUS

## 2021-10-07 NOTE — Progress Notes (Signed)
Patient ID: [name], female   DOB: 1975-03-03, 46 y.o.   MRN: [id number] BP 130/74   Pulse 89   Temp 97.9 F (36.6 C)   Resp (!) 24   Ht '5\' 2"'$  (1.575 m)   Wt 66 kg   SpO2 100%   BMI 26.61 kg/m  Comatose, not responsive to noxious stimuli Pupils fixed and dilated Reportedly triggered ventilator earlier Contacted outside facilities and [facility] had no capacity, [facility] sees no change in treatment.  Continuing to provide full support.

## 2021-10-07 NOTE — Progress Notes (Addendum)
PCCM note  I called [facility] to discuss patient transfer.  Discussed with neurosurgeon Gaspar Cola who said he cannot give a second opinion unless he is able to see the scans.  He requested that I speak with neuro intensivist to discuss transfer.  Then I was informed by bed placement that there are no open beds available and they are not accepting transfer at present.  Updated husband at bedside  Marshell Garfinkel MD [place] [facility] 10/07/2021, 4:28 PM

## 2021-10-07 NOTE — Progress Notes (Addendum)
During a second weaning trial on the ventilator, pt was able to trigger 5 breaths in a 1 minute time frame with trigger adjusted to 5 and PS at 14 without stimulation. RN notified, family at bedside during trial. Pt placed back in full support, no incidents during wean attempt.

## 2021-10-07 NOTE — Progress Notes (Addendum)
PCCM note  I heard back from Dr. Rigoberto Noel who was able to review the scan and in his opinion there is nothing much else to offer at [place] MD [place] [facility] 10/07/2021, 5:02 PM

## 2021-10-07 NOTE — Progress Notes (Signed)
NAME:  [name], MRN:  [id number], DOB:  May 11, 1975, LOS: 5 ADMISSION DATE:  10/05/2021, CONSULTATION DATE: 10/17/2021 REFERRING MD: ED physician, CHIEF COMPLAINT: Subarachnoid hemorrhage  History of Present Illness:  [name] is a 46 yo female with hypertension who presented to the ED after being found unresponsive by her husband. The patient was last seen at her baseline last night and this morning, around 6am, the patient's husband noted that she was "breathing funny" and making gurgling noises. He attempted to wake her and she was unresponsive and had his daughter call EMS. He started CPR and upon EMS arrival, the patient was in SVT and they performed DCCV. HR improved, although she had no improvement in her mental status.    GCS 4 on arrival to ED, proceeded with emergent intubation. Large SAH was noted on CTH. WBC 14.5, Hb 15.3. K 3.2, Cr wnl. Lactic 3.6. Neurosurg and PCCM consulted.    No family history of aneurysms known.   Pertinent  Medical History   Hypertension, uterine fibroids Legally blind in left eye  Significant Hospital Events: Including procedures, antibiotic start and stop dates in addition to other pertinent events   9/7-presented unresponsive, hypotensive 9/7-CT head with subarachnoid hemorrhage 9/11- DDAVP give for Central DI, repeat head CT with worsening infarcts and herniation  Interim History / Subjective:   Remains comatose.  DDAVP given last night for increasing sodium Repeat CT head reviewed  Objective   Blood pressure 132/76, pulse 91, temperature 97.9 F (36.6 C), temperature source Bladder, resp. rate (!) 24, height '5\' 2"'$  (1.575 m), weight 66 kg, SpO2 100 %.    Vent Mode: PRVC FiO2 (%):  [24 %-40 %] 30 % Set Rate:  [24 bmp] 24 bmp Vt Set:  [400 mL] 400 mL PEEP:  [5 cmH20] 5 cmH20 Plateau Pressure:  [16 cmH20-17 cmH20] 16 cmH20   Intake/Output Summary (Last 24 hours) at 10/07/2021 0816 Last data filed at 10/07/2021 0500 Gross per 24  hour  Intake 5162.48 ml  Output 2300 ml  Net 2862.48 ml   Filed Weights   10/10/2021 0722 09/29/2021 1700 10/06/21 0500  Weight: 56.7 kg 66 kg 66 kg    Examination: Gen:      No acute distress HEENT:  EOMI, sclera anicteric Neck:     No masses; no thyromegaly, ET tube Lungs:    Clear to auscultation bilaterally; normal respiratory effort CV:         Regular rate and rhythm; no murmurs Abd:      + bowel sounds; soft, non-tender; no palpable masses, no distension Ext:    No edema; adequate peripheral perfusion Skin:      Warm and dry; no rash Neuro: Comatose, no gag, corneal or cough reflex.  Pupils fixed and dilated.  Labs/imaging reviewed Significant for sodium 159, creatinine 2.97, chloride greater than 130 WBC 11.8, platelets 103 CT head from yesterday reviewed with diffuse subarachnoid hemorrhage, transtentorial herniation and tonsillar herniation.  Complete effacement of CSF surfaces.  Resolved Hospital Problem list    Assessment & Plan:  Catastrophic subarachnoid hemorrhage H/H5 Has no reflexes suggestive of brain death. Keppra for seizure prophylaxis  Hypotension Holding home blood pressure medication Continue norepinephrine for BP maintenance  Acute hypoxemic respiratory failure Concern for aspiration Continue full vent support.  No weans as mental status is poor Unasyn changed to ceftriaxone and Flagyl to reduce sodium load.  Complete 7-day course of antibiotics  Acute kidney injury Hypokalemia Monitor urine output and creatinine  Replete electrolytes  Hypernatremia.  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  Continue free water via tube and D5W Will not correct sodium back to normal as it may increase herniation Goal sodium 155-160  Protein calorie malnutrition Started tube feeds on 9/10  Goals of care Ongoing discussion with family regarding goals of care.   Family discussed with Dr. Christella Noa from neurosurgery yesterday. I updated the  family at bedside today  Best Practice (right click and "Reselect all SmartList Selections" daily)   Diet/type: tubefeeds DVT prophylaxis: SCD GI prophylaxis: PPI Lines: Central line Foley:  N/A Code Status:  full code Last date of multidisciplinary goals of care discussion [discussed with family at bedside]  Signature:   The patient is critically ill with multiple organ system failure and requires high complexity decision making for assessment and support, frequent evaluation and titration of therapies, advanced monitoring, review of radiographic studies and interpretation of complex data.   Critical Care Time devoted to patient care services, exclusive of separately billable procedures, described in this note is 35 minutes.   Marshell Garfinkel MD [place] [facility] See Amion for pager  If no response to pager , please call [phone] until 7pm After 7:00 pm call Elink  [phone] 10/07/2021, 8:16 AM

## 2021-10-07 NOTE — Progress Notes (Signed)
Initial Nutrition Assessment  DOCUMENTATION CODES:   Not applicable  INTERVENTION:   Tube feeding via OG tube:  D/C Vital High Protein   Osmolite 1.5 at 50 ml/h (1200 ml per day) Prosource TF20 60 ml daily  Provides 1880 kcal, 95 gm protein, 912 ml free water daily   200 ml free water every 2 hours Total free water: 3312 ml    NUTRITION DIAGNOSIS:   Inadequate oral intake related to inability to eat as evidenced by NPO status.  GOAL:   Patient will meet greater than or equal to 90% of their needs  MONITOR:   TF tolerance  REASON FOR ASSESSMENT:   Consult Enteral/tube feeding initiation and management  ASSESSMENT:   Pt with PMH of HTN and legally blind in L eye admitted after being found down by husband with CPR. Per CT pt with large SAH.   Pt discussed during ICU rounds and with RN and MD.  Spoke with Pharmacy, noted K down to 2.4. Pt given D5 @ 200 ml/hr likely contributing to drop. K runs and mag being administered.   60 F OG tube; per CT tip in stomach  Medications reviewed and include: SSI, protonix, miralax, KCl 40 mEq BID, senokot-s Levophed @ 5 mcg  Labs reviewed: Na 157, K 3.3, Cl >130, magnesium 1.6 TSH 13.542   NUTRITION - FOCUSED PHYSICAL EXAM:  WNL   Diet Order:   Diet Order             Diet NPO time specified  Diet effective now                   EDUCATION NEEDS:   No education needs have been identified at this time  Skin:  Skin Assessment: Reviewed RN Assessment  Last BM:  unknown  Height:   Ht Readings from Last 1 Encounters:  10/07/21 '5\' 2"'$  (1.575 m)    Weight:   Wt Readings from Last 1 Encounters:  10/06/21 66 kg    BMI:  Body mass index is 26.61 kg/m.  Estimated Nutritional Needs:   Kcal:  1700-1900  Protein:  85-100 grams  Fluid:  >1.7 L/day  Lockie Pares., RD, LDN, CNSC See AMiON for contact information

## 2021-10-08 ENCOUNTER — Inpatient Hospital Stay (HOSPITAL_COMMUNITY): Payer: Medicare Other

## 2021-10-08 DIAGNOSIS — I609 Nontraumatic subarachnoid hemorrhage, unspecified: Secondary | ICD-10-CM | POA: Diagnosis not present

## 2021-10-08 LAB — BASIC METABOLIC PANEL
Anion gap: 8 (ref 5–15)
Anion gap: 7 (ref 5–15)
Anion gap: 5 (ref 5–15)
BUN: 18 mg/dL (ref 6–20)
BUN: 17 mg/dL (ref 6–20)
BUN: 17 mg/dL (ref 6–20)
CO2: 19 mmol/L — ABNORMAL LOW (ref 22–32)
CO2: 19 mmol/L — ABNORMAL LOW (ref 22–32)
CO2: 19 mmol/L — ABNORMAL LOW (ref 22–32)
Calcium: 8.1 mg/dL — ABNORMAL LOW (ref 8.9–10.3)
Calcium: 7.8 mg/dL — ABNORMAL LOW (ref 8.9–10.3)
Calcium: 7.8 mg/dL — ABNORMAL LOW (ref 8.9–10.3)
Chloride: 117 mmol/L — ABNORMAL HIGH (ref 98–111)
Chloride: 118 mmol/L — ABNORMAL HIGH (ref 98–111)
Chloride: 123 mmol/L — ABNORMAL HIGH (ref 98–111)
Creatinine, Ser: 2.14 mg/dL — ABNORMAL HIGH (ref 0.44–1.00)
Creatinine, Ser: 2.08 mg/dL — ABNORMAL HIGH (ref 0.44–1.00)
Creatinine, Ser: 2.05 mg/dL — ABNORMAL HIGH (ref 0.44–1.00)
GFR, Estimated: 28 mL/min — ABNORMAL LOW (ref >60)
GFR, Estimated: 29 mL/min — ABNORMAL LOW (ref >60)
GFR, Estimated: 30 mL/min — ABNORMAL LOW (ref >60)
Glucose, Bld: 222 mg/dL — ABNORMAL HIGH (ref 70–99)
Glucose, Bld: 188 mg/dL — ABNORMAL HIGH (ref 70–99)
Glucose, Bld: 157 mg/dL — ABNORMAL HIGH (ref 70–99)
Potassium: 3.5 mmol/L (ref 3.5–5.1)
Potassium: 3.9 mmol/L (ref 3.5–5.1)
Potassium: 4 mmol/L (ref 3.5–5.1)
Sodium: 144 mmol/L (ref 135–145)
Sodium: 144 mmol/L (ref 135–145)
Sodium: 147 mmol/L — ABNORMAL HIGH (ref 135–145)

## 2021-10-08 LAB — GLUCOSE, CAPILLARY
Glucose-Capillary: 189 mg/dL — ABNORMAL HIGH (ref 70–99)
Glucose-Capillary: 156 mg/dL — ABNORMAL HIGH (ref 70–99)
Glucose-Capillary: 172 mg/dL — ABNORMAL HIGH (ref 70–99)
Glucose-Capillary: 220 mg/dL — ABNORMAL HIGH (ref 70–99)
Glucose-Capillary: 119 mg/dL — ABNORMAL HIGH (ref 70–99)
Glucose-Capillary: 119 mg/dL — ABNORMAL HIGH (ref 70–99)

## 2021-10-08 LAB — MAGNESIUM: Magnesium: 2.4 mg/dL (ref 1.7–2.4)

## 2021-10-08 LAB — PHOSPHORUS: Phosphorus: 4.8 mg/dL — ABNORMAL HIGH (ref 2.5–4.6)

## 2021-10-08 MED ORDER — FUROSEMIDE 10 MG/ML IJ SOLN
20.0000 mg | Freq: Once | INTRAMUSCULAR | Status: AC
  Administered 2021-10-08: 20 mg via INTRAVENOUS
  Filled 2021-10-08: qty 2

## 2021-10-08 MED ORDER — LABETALOL HCL 5 MG/ML IV SOLN
10.0000 mg | INTRAVENOUS | Status: DC | PRN
  Administered 2021-10-08 – 2021-10-12 (×7): 10 mg via INTRAVENOUS
  Filled 2021-10-08 (×7): qty 4

## 2021-10-08 MED ORDER — BISACODYL 10 MG RE SUPP
10.0000 mg | Freq: Once | RECTAL | Status: AC
  Administered 2021-10-08: 10 mg via RECTAL
  Filled 2021-10-08: qty 1

## 2021-10-08 MED ORDER — FREE WATER
60.0000 mL | Status: DC
  Administered 2021-10-08 (×4): 60 mL

## 2021-10-08 MED ORDER — SODIUM CHLORIDE 3 % IV BOLUS
100.0000 mL | Freq: Once | INTRAVENOUS | Status: AC
  Administered 2021-10-08: 100 mL via INTRAVENOUS
  Filled 2021-10-08: qty 500

## 2021-10-08 MED ORDER — SODIUM CHLORIDE 0.9 % IV BOLUS
500.0000 mL | Freq: Once | INTRAVENOUS | Status: AC
  Administered 2021-10-08: 500 mL via INTRAVENOUS

## 2021-10-08 MED ORDER — HYDRALAZINE HCL 20 MG/ML IJ SOLN
10.0000 mg | INTRAMUSCULAR | Status: DC | PRN
  Administered 2021-10-12 (×3): 10 mg via INTRAVENOUS
  Filled 2021-10-08 (×3): qty 1

## 2021-10-08 MED ORDER — POTASSIUM CHLORIDE 10 MEQ/50ML IV SOLN
10.0000 meq | INTRAVENOUS | Status: AC
  Administered 2021-10-08 (×2): 10 meq via INTRAVENOUS
  Filled 2021-10-08 (×2): qty 50

## 2021-10-08 MED ORDER — MIDODRINE HCL 5 MG PO TABS: 10.0000 mg | ORAL_TABLET | Freq: Three times a day (TID) | ORAL | Status: DC

## 2021-10-08 MED ORDER — NOREPINEPHRINE 4 MG/250ML-% IV SOLN: 0.0000 ug/min | INTRAVENOUS | Status: DC

## 2021-10-08 MED ORDER — NOREPINEPHRINE 16 MG/250ML-% IV SOLN: 0.0000 ug/min | INTRAVENOUS | Status: AC

## 2021-10-08 NOTE — Progress Notes (Addendum)
eLink Physician-Brief Progress Note Patient Name: [name] DOB: [birth year] MRN: [id number]   Date of Service  10/08/2021  HPI/Events of Note  Serum sodium 144, which is below goal of 150-155. K+ 3.5  eICU Interventions  D 5W gtt discontinued, free water reduced to 60 ml boluses from 200 ml boluses, will order a one tine 100 ml iv bolus of 3 % saline and trend serum sodium Q   2 hours x 2.  K+ replaced per E-Link electrolyte replacement protocol modified for creatinine of 2.14.        [name] 10/08/2021, 1:41 AM

## 2021-10-08 NOTE — Plan of Care (Signed)
Plan of care discussed with husband at bedside

## 2021-10-08 NOTE — Progress Notes (Addendum)
NAME:  [name], MRN:  [id number], DOB:  12/24/1975, LOS: 6 ADMISSION DATE:  10/14/2021, CONSULTATION DATE: 10/15/2021 REFERRING MD: ED physician, CHIEF COMPLAINT: Subarachnoid hemorrhage  History of Present Illness:  [name] is a 46 yo female with hypertension who presented to the ED after being found unresponsive by her husband. The patient was last seen at her baseline last night and this morning, around 6am, the patient's husband noted that she was "breathing funny" and making gurgling noises. He attempted to wake her and she was unresponsive and had his daughter call EMS. He started CPR and upon EMS arrival, the patient was in SVT and they performed DCCV. HR improved, although she had no improvement in her mental status.    GCS 4 on arrival to ED, proceeded with emergent intubation. Large SAH was noted on CTH. Neurosurg and PCCM consulted.    No family history of aneurysms known.   Pertinent  Medical History   Hypertension, uterine fibroids Legally blind in left eye  Significant Hospital Events: Including procedures, antibiotic start and stop dates in addition to other pertinent events   9/7-presented unresponsive, hypotensive 9/7-CT head with subarachnoid hemorrhage 9/11- DDAVP give for Central DI, repeat head CT with worsening infarcts and herniation 9/12- Attempted transfer for second opinion at [facility] and [facility]  No beds at [facility] and transfer was declined at [place] / Subjective:   Attempted transfer for second opinion at [facility].  No beds at [facility] and transfer was declined at [facility] Given 100 cc of 3% saline as sodium went down to 144.  Mental status remains unchanged. Per RT report she had some spontaneous breaths yesterday.  Objective   Blood pressure 118/74, pulse 91, temperature 99.5 F (37.5 C), resp. rate (!) 24, height '5\' 2"'$  (1.575 m), weight 66 kg, SpO2 100 %.    Vent Mode: PRVC FiO2 (%):  [30 %] 30 % Set Rate:   [24 bmp] 24 bmp Vt Set:  [400 mL] 400 mL PEEP:  [5 cmH20] 5 cmH20 Plateau Pressure:  [16 cmH20-18 cmH20] 18 cmH20   Intake/Output Summary (Last 24 hours) at 10/08/2021 0854 Last data filed at 10/08/2021 0800 Gross per 24 hour  Intake 4318.09 ml  Output 2925 ml  Net 1393.09 ml   Filed Weights   10/15/2021 0722 10/06/2021 1700 10/06/21 0500  Weight: 56.7 kg 66 kg 66 kg    Examination: Gen:      No acute distress HEENT:  EOMI, sclera anicteric Neck:     No masses; no thyromegaly, ETT Lungs:    Clear to auscultation bilaterally; normal respiratory effort CV:         Regular rate and rhythm; no murmurs Abd:      + bowel sounds; soft, non-tender; no palpable masses, no distension Ext:   1+ edema; adequate peripheral perfusion Skin:      Warm and dry; no rash Neuro: Comatose, no gag, corneal or cough reflex.  Pupils fixed and dilated  Labs/imaging reviewed Significant for sodium 147, BUN/creatinine 17/2.05 WBC 11.8 No new imaging   Resolved Hospital Problem list    Assessment & Plan:  Catastrophic subarachnoid hemorrhage H/H5 Continues to have poor neurologic status Keppra for seizure prophylaxis  Hypotension Holding home blood pressure medication Wean norepinephrine for goal map of 65 Add midodrine if needed  Acute hypoxemic respiratory failure Concern for aspiration Continue full vent support.  No weans as mental status is poor Continue ceftriaxone  and Flagyl.  Day 7/7 Lasix 20 mg IV once as she appears volume overloaded  Acute kidney injury Monitor urine output and creatinine  Hypernatremia We will target a higher range due to cerebral edema. Normal saline 500 cc bolus now.  Protein calorie malnutrition Started tube feeds on 9/10  Goals of care Ongoing discussion with family regarding goals of care.   Decline for transfer at [facility] and no beds at [facility].  Family is still not ready to make any changes to goals of care Family (husband and children) updated at  bedside 9/13  Best Practice (right click and "Reselect all SmartList Selections" daily)   Diet/type: tubefeeds DVT prophylaxis: SCD GI prophylaxis: PPI Lines: Central line Foley:  N/A Code Status:  full code Last date of multidisciplinary goals of care discussion [discussed with family at bedside]  Signature:   The patient is critically ill with multiple organ system failure and requires high complexity decision making for assessment and support, frequent evaluation and titration of therapies, advanced monitoring, review of radiographic studies and interpretation of complex data.   Critical Care Time devoted to patient care services, exclusive of separately billable procedures, described in this note is 35 minutes.   Marshell Garfinkel MD [place] [facility] See Amion for pager  If no response to pager , please call [phone] until 7pm After 7:00 pm call Elink  [phone] 10/08/2021, 9:01 AM

## 2021-10-08 NOTE — Progress Notes (Signed)
eLink Physician-Brief Progress Note Patient Name: [name] DOB: 1975-08-16 MRN: [id number]   Date of Service  10/08/2021  HPI/Events of Note  Patient needs serum sodium check.  eICU Interventions  BMP ordered.        Mariyana Fulop U Jourdyn Ferrin 10/08/2021, 12:00 AM

## 2021-10-08 NOTE — Progress Notes (Signed)
ETT noted to be out to 21 cm.  ETT advanced back to 24 cm and CXR ordered per Dr. Vaughan Browner.

## 2021-10-08 NOTE — Progress Notes (Signed)
Patient ID: [name], female   DOB: 1975-12-22, 46 y.o.   MRN: [id number] BP 112/66   Pulse 82   Temp (!) 96.8 F (36 C)   Resp (!) 24   Ht '5\' 2"'$  (1.575 m)   Wt 66 kg   SpO2 100%   BMI 26.61 kg/m  Comatose, pupils fixed and dilated No corneals, no oculocephalics No gag, no cough No movement in extremities to noxious stimuli Made call to [facility]. They declined the transfer, felt her exam, and length of time rendered this a fruitless endeavor. The family member heard the physician's explanation in my presence.  No triggered breaths identified today.  Prognosis remains grave.

## 2021-10-09 ENCOUNTER — Inpatient Hospital Stay (HOSPITAL_COMMUNITY): Payer: Medicare Other

## 2021-10-09 DIAGNOSIS — I609 Nontraumatic subarachnoid hemorrhage, unspecified: Secondary | ICD-10-CM | POA: Diagnosis not present

## 2021-10-09 LAB — BASIC METABOLIC PANEL
BUN: 31 mg/dL — ABNORMAL HIGH (ref 6–20)
CO2: 21 mmol/L — ABNORMAL LOW (ref 22–32)
Calcium: 9.1 mg/dL (ref 8.9–10.3)
Chloride: >130 mmol/L (ref 98–111)
Creatinine, Ser: 2.46 mg/dL — ABNORMAL HIGH (ref 0.44–1.00)
GFR, Estimated: 24 mL/min — ABNORMAL LOW (ref >60)
Glucose, Bld: 193 mg/dL — ABNORMAL HIGH (ref 70–99)
Potassium: 4.6 mmol/L (ref 3.5–5.1)
Sodium: 161 mmol/L (ref 135–145)

## 2021-10-09 LAB — GLUCOSE, CAPILLARY
Glucose-Capillary: 150 mg/dL — ABNORMAL HIGH (ref 70–99)
Glucose-Capillary: 163 mg/dL — ABNORMAL HIGH (ref 70–99)
Glucose-Capillary: 175 mg/dL — ABNORMAL HIGH (ref 70–99)
Glucose-Capillary: 226 mg/dL — ABNORMAL HIGH (ref 70–99)
Glucose-Capillary: 192 mg/dL — ABNORMAL HIGH (ref 70–99)
Glucose-Capillary: 234 mg/dL — ABNORMAL HIGH (ref 70–99)

## 2021-10-09 LAB — CBC
HCT: 31.2 % — ABNORMAL LOW (ref 36.0–46.0)
Hemoglobin: 10 g/dL — ABNORMAL LOW (ref 12.0–15.0)
MCH: 30.1 pg (ref 26.0–34.0)
MCHC: 32.1 g/dL (ref 30.0–36.0)
MCV: 94 fL (ref 80.0–100.0)
Platelets: 57 10*3/uL — ABNORMAL LOW (ref 150–400)
RBC: 3.32 MIL/uL — ABNORMAL LOW (ref 3.87–5.11)
RDW: 14.4 % (ref 11.5–15.5)
WBC: 6.7 10*3/uL (ref 4.0–10.5)
nRBC: 0.4 % — ABNORMAL HIGH (ref 0.0–0.2)

## 2021-10-09 LAB — OSMOLALITY, URINE: Osmolality, Ur: 200 mOsm/kg — ABNORMAL LOW (ref 300–900)

## 2021-10-09 MED ORDER — FUROSEMIDE 10 MG/ML IJ SOLN: 20.0000 mg | Freq: Once | INTRAMUSCULAR | Status: DC

## 2021-10-09 MED ORDER — DESMOPRESSIN ACETATE 4 MCG/ML IJ SOLN
4.0000 ug | Freq: Once | INTRAMUSCULAR | Status: AC
  Administered 2021-10-09: 4 ug via INTRAVENOUS
  Filled 2021-10-09: qty 1

## 2021-10-09 MED ORDER — DEXTROSE 5 % IV SOLN: INTRAVENOUS | Status: DC

## 2021-10-09 MED ORDER — MIDODRINE HCL 5 MG PO TABS
10.0000 mg | ORAL_TABLET | Freq: Three times a day (TID) | ORAL | Status: DC
  Administered 2021-10-09 – 2021-10-12 (×8): 10 mg
  Filled 2021-10-09 (×8): qty 2

## 2021-10-09 NOTE — Plan of Care (Signed)
Plan of care discussed with husband and daughter

## 2021-10-09 NOTE — Progress Notes (Signed)
Dr Vaughan Browner notified of pupil change.

## 2021-10-09 NOTE — Progress Notes (Addendum)
NAME:  [name], MRN:  [id number], DOB:  [birth year], LOS: 7 ADMISSION DATE:  10/17/2021, CONSULTATION DATE: 09/27/2021 REFERRING MD: ED physician, CHIEF COMPLAINT: Subarachnoid hemorrhage  History of Present Illness:  [name] is a 46 yo female with hypertension who presented to the ED after being found unresponsive by her husband. The patient was last seen at her baseline last night and this morning, around 6am, the patient's husband noted that she was "breathing funny" and making gurgling noises. He attempted to wake her and she was unresponsive and had his daughter call EMS. He started CPR and upon EMS arrival, the patient was in SVT and they performed DCCV. HR improved, although she had no improvement in her mental status.    GCS 4 on arrival to ED, proceeded with emergent intubation. Large SAH was noted on CTH. Neurosurg and PCCM consulted.    No family history of aneurysms known.   Pertinent  Medical History   Hypertension, uterine fibroids Legally blind in left eye  Significant Hospital Events: Including procedures, antibiotic start and stop dates in addition to other pertinent events   9/7-presented unresponsive, hypotensive 9/7-CT head with subarachnoid hemorrhage 9/11- DDAVP give for Central DI, repeat head CT with worsening infarcts and herniation 9/12- Attempted transfer for second opinion at [facility] and [facility].  No beds at [facility] and transfer was declined at [facility] 9/13-no change in mental status.  Transfer to [facility] declined  Interim History / Subjective:   No change in mental status.  [facility] declined yesterday. 1.9 L output yesterday with 20 mg of Lasix.  Objective   Blood pressure 91/60, pulse 92, temperature 98.4 F (36.9 C), resp. rate (!) 24, height '5\' 2"'$  (1.575 m), weight 66 kg, SpO2 100 %.    Vent Mode: PRVC FiO2 (%):  [30 %] 30 % Set Rate:  [24 bmp] 24 bmp Vt Set:  [400 mL] 400 mL PEEP:  [5 cmH20] 5 cmH20 Plateau Pressure:  [16  cmH20-17 cmH20] 17 cmH20   Intake/Output Summary (Last 24 hours) at 10/09/2021 0856 Last data filed at 10/09/2021 0700 Gross per 24 hour  Intake 1843.78 ml  Output 4250 ml  Net -2406.22 ml   Filed Weights   10/22/2021 0722 09/28/2021 1700 10/06/21 0500  Weight: 56.7 kg 66 kg 66 kg    Examination: Gen:      No acute distress HEENT:  EOMI, sclera anicteric Neck:     No masses; no thyromegaly. ETT Lungs:    Clear to auscultation bilaterally; normal respiratory effort CV:         Regular rate and rhythm; no murmurs Abd:      + bowel sounds; soft, non-tender; no palpable masses, no distension Ext:    2+ edema; adequate peripheral perfusion Skin:      Warm and dry; no rash Neuro: Comatose, no corneal gag or cough reflex.  Pupils fixed and dilated.  Labs/imaging reviewed Sodium 161, chloride greater than 130, creatinine 2.46 Chest x-ray with atelectasis.  Resolved Hospital Problem list    Assessment & Plan:  Catastrophic subarachnoid hemorrhage H/H5 Continues to have poor neurologic status. Keppra for seizure prophylaxis  Hypotension with labile blood pressures Holding home blood pressure medication. On norepinephrine for goal map of 65  Acute hypoxemic respiratory failure Concern for aspiration Continue full vent support.  No weans as mental status is poor She finished 7 days of antibiotics Hold lasix for today as Cr is higher  Acute kidney injury  Monitor urine output and creatinine.  Hypernatremia Sodium increased again to 161.  Restart free water with D5W Recheck urine osmolality.  Protein calorie malnutrition Continue tube feeds  Goals of care Ongoing discussion with family regarding goals of care.   Declined for transfer at [facility], [place] and [place].  Family is still not ready to make any changes to goals of care or agree to brain death assessment.  Best Practice (right click and "Reselect all SmartList Selections" daily)   Diet/type: tubefeeds DVT prophylaxis:  SCD GI prophylaxis: PPI Lines: Central line Foley:  N/A Code Status:  full code Last date of multidisciplinary goals of care discussion [discussed with family at bedside]  Signature:   The patient is critically ill with multiple organ system failure and requires high complexity decision making for assessment and support, frequent evaluation and titration of therapies, advanced monitoring, review of radiographic studies and interpretation of complex data.   Critical Care Time devoted to patient care services, exclusive of separately billable procedures, described in this note is 35 minutes.   Marshell Garfinkel MD [facility] See Amion for pager  If no response to pager , please call [phone] until 7pm After 7:00 pm call Elink  [phone] 10/09/2021, 8:56 AM

## 2021-10-09 NOTE — Progress Notes (Signed)
Patient ID: [name], female   DOB: 1975-04-05, 46 y.o.   MRN: [id number] BP 126/81   Pulse 98   Temp 97.9 F (36.6 C)   Resp (!) 24   Ht '5\' 2"'$  (1.575 m)   Wt 66 kg   SpO2 100%   BMI 26.61 kg/m  Comatose,  No corneals, cough, gag, oculocephalics No response to noxious stimuli Called [facility] again. The neurovascular fellow explained why they would not take her in transfer. Husband present for entire duration of the phone call. No change in exam.

## 2021-10-10 DIAGNOSIS — I609 Nontraumatic subarachnoid hemorrhage, unspecified: Secondary | ICD-10-CM | POA: Diagnosis not present

## 2021-10-10 LAB — CBC
HCT: 31 % — ABNORMAL LOW (ref 36.0–46.0)
HCT: 30.5 % — ABNORMAL LOW (ref 36.0–46.0)
Hemoglobin: 9.8 g/dL — ABNORMAL LOW (ref 12.0–15.0)
Hemoglobin: 9.6 g/dL — ABNORMAL LOW (ref 12.0–15.0)
MCH: 30.2 pg (ref 26.0–34.0)
MCH: 30.4 pg (ref 26.0–34.0)
MCHC: 31.6 g/dL (ref 30.0–36.0)
MCHC: 31.5 g/dL (ref 30.0–36.0)
MCV: 95.4 fL (ref 80.0–100.0)
MCV: 96.5 fL (ref 80.0–100.0)
Platelets: 81 10*3/uL — ABNORMAL LOW (ref 150–400)
Platelets: 84 10*3/uL — ABNORMAL LOW (ref 150–400)
RBC: 3.25 MIL/uL — ABNORMAL LOW (ref 3.87–5.11)
RBC: 3.16 MIL/uL — ABNORMAL LOW (ref 3.87–5.11)
RDW: 14.8 % (ref 11.5–15.5)
RDW: 14.7 % (ref 11.5–15.5)
WBC: 6 10*3/uL (ref 4.0–10.5)
WBC: 7.9 10*3/uL (ref 4.0–10.5)
nRBC: 0.5 % — ABNORMAL HIGH (ref 0.0–0.2)
nRBC: 0.3 % — ABNORMAL HIGH (ref 0.0–0.2)

## 2021-10-10 LAB — DIFFERENTIAL
Abs Immature Granulocytes: 0.07 10*3/uL (ref 0.00–0.07)
Basophils Absolute: 0 10*3/uL (ref 0.0–0.1)
Basophils Relative: 0 %
Eosinophils Absolute: 0.1 10*3/uL (ref 0.0–0.5)
Eosinophils Relative: 1 %
Immature Granulocytes: 1 %
Lymphocytes Relative: 38 %
Lymphs Abs: 3 10*3/uL (ref 0.7–4.0)
Monocytes Absolute: 0.6 10*3/uL (ref 0.1–1.0)
Monocytes Relative: 8 %
Neutro Abs: 4.1 10*3/uL (ref 1.7–7.7)
Neutrophils Relative %: 52 %

## 2021-10-10 LAB — BASIC METABOLIC PANEL
BUN: 45 mg/dL — ABNORMAL HIGH (ref 6–20)
CO2: 20 mmol/L — ABNORMAL LOW (ref 22–32)
Calcium: 9.2 mg/dL (ref 8.9–10.3)
Chloride: >130 mmol/L (ref 98–111)
Creatinine, Ser: 2.63 mg/dL — ABNORMAL HIGH (ref 0.44–1.00)
GFR, Estimated: 22 mL/min — ABNORMAL LOW (ref >60)
Glucose, Bld: 162 mg/dL — ABNORMAL HIGH (ref 70–99)
Potassium: 4.3 mmol/L (ref 3.5–5.1)
Sodium: 161 mmol/L (ref 135–145)

## 2021-10-10 LAB — URINALYSIS, ROUTINE W REFLEX MICROSCOPIC
Bilirubin Urine: NEGATIVE
Glucose, UA: 150 mg/dL — AB
Ketones, ur: NEGATIVE mg/dL
Leukocytes,Ua: NEGATIVE
Nitrite: NEGATIVE
Protein, ur: 100 mg/dL — AB
RBC / HPF: >50 RBC/hpf — ABNORMAL HIGH (ref 0–5)
Specific Gravity, Urine: 1.008 (ref 1.005–1.030)
pH: 8 (ref 5.0–8.0)

## 2021-10-10 LAB — GLUCOSE, CAPILLARY
Glucose-Capillary: 223 mg/dL — ABNORMAL HIGH (ref 70–99)
Glucose-Capillary: 158 mg/dL — ABNORMAL HIGH (ref 70–99)
Glucose-Capillary: 173 mg/dL — ABNORMAL HIGH (ref 70–99)
Glucose-Capillary: 213 mg/dL — ABNORMAL HIGH (ref 70–99)
Glucose-Capillary: 241 mg/dL — ABNORMAL HIGH (ref 70–99)
Glucose-Capillary: 411 mg/dL — ABNORMAL HIGH (ref 70–99)

## 2021-10-10 LAB — SODIUM: Sodium: 158 mmol/L — ABNORMAL HIGH (ref 135–145)

## 2021-10-10 MED ORDER — DIPHENHYDRAMINE HCL 50 MG/ML IJ SOLN
50.0000 mg | Freq: Three times a day (TID) | INTRAMUSCULAR | Status: AC
  Administered 2021-10-10 – 2021-10-11 (×3): 50 mg via INTRAVENOUS
  Filled 2021-10-10 (×3): qty 1

## 2021-10-10 MED ORDER — PHENTOLAMINE MESYLATE 5 MG IJ SOLR
5.0000 mg | Freq: Once | INTRAMUSCULAR | Status: DC
  Filled 2021-10-10: qty 5

## 2021-10-10 MED ORDER — SILVER SULFADIAZINE 1 % EX CREA
TOPICAL_CREAM | Freq: Every day | CUTANEOUS | Status: DC
  Administered 2021-10-19 – 2021-11-29 (×8): 1 via TOPICAL
  Filled 2021-10-10: qty 85

## 2021-10-10 MED ORDER — METHYLPREDNISOLONE SODIUM SUCC 40 MG IJ SOLR
40.0000 mg | Freq: Two times a day (BID) | INTRAMUSCULAR | Status: DC
  Administered 2021-10-11: 40 mg via INTRAVENOUS
  Filled 2021-10-10: qty 1

## 2021-10-10 MED ORDER — METHYLPREDNISOLONE SODIUM SUCC 40 MG IJ SOLR: 40.0000 mg | Freq: Every day | INTRAMUSCULAR | Status: DC

## 2021-10-10 MED ORDER — METHYLPREDNISOLONE SODIUM SUCC 40 MG IJ SOLR
INTRAMUSCULAR | Status: AC
  Administered 2021-10-10: 40 mg via INTRAVENOUS
  Filled 2021-10-10: qty 1

## 2021-10-10 MED ORDER — POLYETHYLENE GLYCOL 3350 17 G PO PACK
17.0000 g | PACK | Freq: Every day | ORAL | Status: DC
  Administered 2021-10-19 – 2021-11-28 (×17): 17 g
  Filled 2021-10-10 (×21): qty 1

## 2021-10-10 MED ORDER — FAMOTIDINE IN NACL 20-0.9 MG/50ML-% IV SOLN
20.0000 mg | INTRAVENOUS | Status: DC
  Administered 2021-10-10 – 2021-10-12 (×3): 20 mg via INTRAVENOUS
  Filled 2021-10-10 (×3): qty 50

## 2021-10-10 MED ORDER — ALTEPLASE 2 MG IJ SOLR
2.0000 mg | Freq: Once | INTRAMUSCULAR | Status: AC
  Administered 2021-10-10: 2 mg
  Filled 2021-10-10: qty 2

## 2021-10-10 MED ORDER — INSULIN ASPART 100 UNIT/ML IJ SOLN
0.0000 [IU] | INTRAMUSCULAR | Status: DC
  Administered 2021-10-10 (×2): 3 [IU] via SUBCUTANEOUS
  Administered 2021-10-10 (×2): 5 [IU] via SUBCUTANEOUS

## 2021-10-10 MED ORDER — HEPARIN SODIUM (PORCINE) 5000 UNIT/ML IJ SOLN
5000.0000 [IU] | Freq: Three times a day (TID) | INTRAMUSCULAR | Status: DC
  Administered 2021-10-10 – 2021-10-18 (×24): 5000 [IU] via SUBCUTANEOUS
  Filled 2021-10-10 (×24): qty 1

## 2021-10-10 MED ORDER — INSULIN ASPART 100 UNIT/ML IJ SOLN
0.0000 [IU] | INTRAMUSCULAR | Status: DC
  Administered 2021-10-10 – 2021-10-11 (×5): 20 [IU] via SUBCUTANEOUS

## 2021-10-10 MED ORDER — FREE WATER
200.0000 mL | Status: DC
  Administered 2021-10-10 (×3): 200 mL

## 2021-10-10 NOTE — Progress Notes (Signed)
All 3 lumens of PICC line are not drawing back blood. All 3 lumens flush adequately. Phlebotomy called to make patient a lab draw for now. IV team consulted to look at PICC line. CCM Ina Homes, MD notified.  Montez Hageman RN

## 2021-10-10 NOTE — Progress Notes (Signed)
eLink Physician-Brief Progress Note Patient Name: [name] DOB: [birth year] MRN: [id number]   Date of Service  10/10/2021  HPI/Events of Note  Hyperglycemia - Blood glucose = 411.   eICU Interventions  Plan: Decrease D5W IV infusion to 50 mL/hour. Change to Q 4 hour resistant Novolog SSI.     Intervention Category Major Interventions: Hyperglycemia - active titration of insulin therapy  Lysle Dingwall 10/10/2021, 11:41 PM

## 2021-10-10 NOTE — Progress Notes (Signed)
NAME:  [name], MRN:  [id number], DOB:  11-26-1975, LOS: 8 ADMISSION DATE:  10/20/2021, CONSULTATION DATE: 10/09/2021 REFERRING MD: ED physician, CHIEF COMPLAINT: Subarachnoid hemorrhage  History of Present Illness:  [name] is a 45 yo female with hypertension who presented to the ED after being found unresponsive by her husband. The patient was last seen at her baseline last night and this morning, around 6am, the patient's husband noted that she was "breathing funny" and making gurgling noises. He attempted to wake her and she was unresponsive and had his daughter call EMS. He started CPR and upon EMS arrival, the patient was in SVT and they performed DCCV. HR improved, although she had no improvement in her mental status.    GCS 4 on arrival to ED, proceeded with emergent intubation. Large SAH was noted on CTH. Neurosurg and PCCM consulted.    No family history of aneurysms known.   Pertinent  Medical History  Hypertension, uterine fibroids Legally blind in left eye  Significant Hospital Events: Including procedures, antibiotic start and stop dates in addition to other pertinent events   9/7-presented unresponsive, hypotensive 9/7-CT head with subarachnoid hemorrhage 9/11- DDAVP give for Central DI, repeat head CT with worsening infarcts and herniation 9/12- Attempted transfer for second opinion at [facility] and [facility]  No beds at [facility] and transfer was declined at [facility] 9/13-no change in mental status.  Transfer to [place] declined 9/14- [facility] declined transfer again.  No change.  DDAVP x 1  Interim History / Subjective:  No change.   [facility] declined transfer again 9/14 Remains on NE 2 mcg UOP 2.7L / 24hrs  Objective   Blood pressure 112/62, pulse 96, temperature 99.7 F (37.6 C), temperature source Bladder, resp. rate (!) 24, height '5\' 2"'$  (1.575 m), weight 66 kg, SpO2 100 %.    Vent Mode: PRVC FiO2 (%):  [30 %] 30 % Set Rate:  [24 bmp] 24 bmp Vt Set:  [400 mL] 400  mL PEEP:  [5 cmH20] 5 cmH20 Plateau Pressure:  [16 cmH20-18 cmH20] 18 cmH20   Intake/Output Summary (Last 24 hours) at 10/10/2021 0735 Last data filed at 10/10/2021 0734 Gross per 24 hour  Intake 2885.69 ml  Output 2825 ml  Net 60.69 ml   Filed Weights   10/24/2021 0722 10/13/2021 1700 10/06/21 0500  Weight: 56.7 kg 66 kg 66 kg    Examination: General:  critically ill adult female unresponsive in NAD, intubated/ MV HEENT: MM pink/moist, pupils fixed/ dilated, absent corneals Neuro: unresponsive to noxious stimuli CV: rr, NSR PULM:  non labored, CTA, no cough/ gag, no secretions GI: soft, bs+, ND, foley  Extremities: warm/dry, generalized edema  Skin: no rashes  Labs/imaging reviewed BMET/ CBC pending  Resolved Hospital Problem list    Assessment & Plan:  Catastrophic subarachnoid hemorrhage H/H5 Grim prognosis.  Ongoing family discussions. Keppra for seizure prophylaxis  Hypotension with labile blood pressures Cont NE for MAP goal > 65 Cont midodrine '10mg'$  TID  Acute hypoxemic respiratory failure Concern for aspiration Completed 7 days of abx 9/13 Cont full MV support> not triggering breaths  Acute kidney injury UOP 2.7L/ 24hrs, net +1.4 Pending BMET  Hypernatremia Pending BMET cont free water with D5W Recheck urine osmolality 372> 200  Protein calorie malnutrition Cont tube feeds  Goals of care Ongoing discussion with family regarding goals of care.   Declined for transfer at [facility], [place] [facility], and [place].  Family is still not ready to make any  changes to goals of care or agree to brain death assessment.  Best Practice (right click and "Reselect all SmartList Selections" daily)   Diet/type: tubefeeds DVT prophylaxis: SCD GI prophylaxis: PPI Lines: Central line- RUE PICC Foley:  N/A Code Status:  full code Last date of multidisciplinary goals of care discussion [discussed with family at bedside]> ongoing  Son updated at bedside.   CCT: 32 mins       Shawna Rad, MSN, AG-ACNP-BC [facility] 10/10/2021, 7:35 AM  See Amion for pager If no response to pager, please call PCCM consult pager After 7:00 pm call Elink

## 2021-10-10 NOTE — Progress Notes (Signed)
Patient ID: [name], female   DOB: 03/29/1975, 46 y.o.   MRN: [id number] BP (!) 157/74   Pulse 100   Temp (!) 101.3 F (38.5 C)   Resp (!) 24   Ht '5\' 2"'$  (1.575 m)   Wt 66 kg   SpO2 98%   BMI 26.61 kg/m  Comatose, pupils fixed and dilated,  No response to noxious stimuli No corneals, oculocephalics No neurological changes noted. Family desires all available measures

## 2021-10-10 NOTE — Progress Notes (Signed)
Date and time results received: 10/10/21 10:36 AM  Test: Na+  Critical Value: 161   Test: Chloride Critical Value: >130   Name of Provider Notified: Kennieth Rad, NP (CCM)  Orders Received? Or Actions Taken?: Will follow up with further orders.  Montez Hageman RN

## 2021-10-10 NOTE — Progress Notes (Signed)
Evening rounds  What looked more like allergic reaction has progressed more bullous (see photos).  Will apply silvadene and nonadherent dressing.  Deeper workup unlikely to change prognosis.  Continue vent support, GOC discussions.  Erskine Emery MD PCCM

## 2021-10-10 NOTE — Progress Notes (Signed)
Serous blisters noted under tegaderm at PICC site and L AC IV site. Ina Homes, MD notified.  Montez Hageman RN

## 2021-10-10 NOTE — Progress Notes (Addendum)
PCCM Interval Note   Notified by RN of worsening rash to left forearm, new since this morning now on RUE. Area started out with erythema, then developed bullae then with opening of blister.  Some areas with prior IV but all meds have been via PICC.   Pics as noted.  No oral mucosal involvement thus far, normal BM today.  No rash noted on back, or trunk.  Tmax 99.7.    Left forearm/ arm RUE RUE Right hand Left buttock Additionally is developing some fluid filled blisters in inner thigh.     P:  Unclear etiology of progressive rash since this morning.  Will treat for now as allergic rxn with scheduled pepcid, benadryl, and solumedrol and cover with non-adherent dressing.  Add on differential to am CBC and check UA for eosinophils given worsening sCr.  Only new med today was SQ heparin added.  Was on unasyn 9/11, then ceftriaxone when ended 9/13.  Other drugs of concern are keppra 9/7- 9/15.  Ddx include delayed allergic rxn> unknown, no personal hx of latex, SJS/ TEN/ DRESS.  Continue to monitor for now.  Consider punch biopsy.  Discussed with attending.      Additional time: CCT 25 mins     Kennieth Rad, MSN, AG-ACNP-BC [facility] 10/10/2021, 3:39 PM  See Amion for pager If no response to pager, please call PCCM consult pager After 7:00 pm call Elink

## 2021-10-10 NOTE — Progress Notes (Signed)
Kennieth Rad, NP (CCM) notified that patient's blisters on both arms are getting worse. New blisters found on buttocks and under blood pressure cuff. All blisters covered with foam dressing.   Montez Hageman RN

## 2021-10-10 NOTE — Ethics Note (Signed)
Ethics Consult Note  Initial contact w/ medical team 10/09/2021, which was on patient's hospital day 7  Source of Consult: Leverne Humbles, Surveyor, quantity of 4N unit  Current attending physician/service: Ashok Pall, MD Neuro ICU: Dr. Kimber Relic - critical care  Reason(s) for consult / relevant ethical question(s): Can the attending physician, in conjunction with relevant specialists, override the proxy's refusal of non-invasive testing for determination of brain death (or other diagnostic studies)?  Information-gathering: Discussion with source of consult Lynelle Smoke Sherlene Shams), current neurosurgery attending Dr. Ashok Pall, and ICU attending Dr. Kimber Relic.  Chart review 10/10/2021  Narrative:  Medical situation:  [name] is a 46 year old woman who presented to the emergency room via ambulance on 9/07 after her husband found her around 0600 to be unresponsive with "funny breathing" and gurgling noises. Per ED notes, her husband reported she was normal the night before. When she arrived at the emergency room, she required shocks for an abnormal heart rhythm and intubation.. Emergent head imaging showed a large subarachnoid hemorrhage. Critical care and neurosurgery services were consulted and admitted her to the hospital. Throughout her hospital admission, she has remained unconscious and unresponsive, requiring mechanical ventilation. Repeat head imaging showed worsening infarcts and herniation. Family appears to be suspicious of attempts to test for brain death and have declined such testing thus far. They have requested transfer to another facility. Per chart and conversation with the medical team, the attending consulted three surrounding tertiary care hospitals, but transfer was declined by each facility.   Patient's Personal Situation:  There is no identifiable health care power of attorney (HCPOA) or advanced directive of any kind.  This patient does not have capacity to make decisions  regarding her care, given that she is persistently unresponsive and requiring mechanical ventilation Ms. Abernethy's husband, mother, father, and other family members have been making decisions together.   Recommendations / Ethical Analysis: Ethical question: Can the attending physician, in conjunction with relevant specialists, override the proxy's refusal of non-invasive testing for determination of brain death (or other diagnostic studies)? Every effort should be made to exhaust the opportunity for shared decision making with the patient/proxy. It appears that the attending physician and appropriate specialists have made significant efforts to engage the patient's family. There does not appear to be any disagreement among the physicians involved in this patient's care regarding the need for brain death testing to help with decision-making and informing her husband about her condition and prognosis.    The difference between consent to treatment versus consent to testing: The procedure in question is diagnostic, not aimed at treatment or cure, and will not in and of itself result in any change to the patient's health. Please note that consent to or refusal of testing, rather than treatment, is less commonly discussed in law and ethics. With testing such as this that carries grave ramifications, it should be appropriately discussed with family and purposefully conducted. Please see below for next steps in the event brain death is confirmed.  There is precedent for overriding refusal of treatment, such as in life-and-death pediatric emergencies requiring blood transfusion. Obviously, this is not an acute "life or death" situation such as an urgent surgery or blood transfusion, but this test is being done to guide life-and-death decisions. Therefore, ethically, it seems that the attending physician similarly could override the proxy's refusal of testing in situations in which benefits of testing outweigh harms  of testing and in which testing is needed to determine next steps in caring  for the patient.   The departments of risk management and legal services may be able to answer the question more fully regarding the legality of overriding the proxy's refusal to test. I am only able to offer ethical considerations.   Nonmaleficence vs Beneficence  Risks to patient (potential outcomes, nonmaleficence): Brain death testing appears to be non-invasive and thus poses very little risk to the patient who is otherwise stable on the current treatment regimen (medications, ventilator settings, etc) at this time. From what I can gather from the medical team and chart, the family does seem apprehensive regarding the results of the brain death testing. There appears to be significant distrust between the family and the medical team. Again, we cannot stress enough the importance of exhausting all efforts at collaborating with the family in every way possible throughout this process. We should consider that the family may define "risk" of the test as: these results might give information that they are not able or willing to accept, he may suffer emotionally, etc. It should be noted that our duty is to the patient's best interest, and we should not make decisions based solely on the emotional burdens of anyone other than the patient, though we of course will guide and support family to the best of our ability. 46 The medical team, after performing brain death testing, will have more information to share with the family that could help to guide an informed decision on the course of Ms. Sallie's subsequent care. Possible outcomes of this testing could either definitively confirm brain death (in which case family could arrange transfer or move forward with declaring medical futility and allowing natural death) or could cast doubt on the diagnosis of brain death (in which case the appropriateness of  mechanical ventilation and aggressive measures could be re-considered).   Recommendations: Because the benefits of non-invasive testing required to diagnose brain death outweigh the risks involved for the patient herself and the testing will in no way alter her condition or prognosis, it is ethically permissible for the attending physician in conjunction with a relevant specialist to override the proxy's refusal of standard brain death testing given that the medical team believes this is necessary and appropriate to guide medical decision-making. However, we do recommend full involvement with and explanation to the family as much as possible, if this does not unduly impede the diagnostic testing.  We do recommend that the medical team work with risk management and the legal team to identify any legal statutes or precedents regarding such unilateral decisions concerning non-invasive diagnostic testing for brain death.   In the case that brain death is confirmed with appropriate testing in accordance with common standard of medical practice and any relevant hospital policies, please refer to the below ethical principles:   1)  It is ethically justifiable to withdraw artificial life support and to withhold CPR for patients who are brain dead. Brain death is considered medical death. Meaningful recovery is not possible. Therefore any further attempts at treatment  (including medications, surgeries, CPR, artifical life support, etc) are considered to be futile. Physicians are not obligated to offer or to continue futile medical interventions. The attending physician and critical care attending physician are qualified to determine brain death. We encourage reference to relevant resources.   2) It is ethically justifiable to continue life support for limited time past time of brain death to allow for: organ donation if relevant.  time for family/others to convene at bedside for  withdrawal of artificial life  support, but this should not be delayed indefinitely.   Relevant underlying ethical principles were discussed directly with Leverne Humbles Recruitment consultant of 4N unit) and Dr. Kimber Relic (critical care physician). Other resources were discussed directly with them, if needed. Ethics committee strives to ensure that all necessary and appropriate steps are taken by the medical team such that all decisions made for this patient are ethically appropriate. Please note that Ethics committee members will NOT offer legal or medical counsel.  Thank you for this consult. Ethics will continue to be involved in this case as needed, but will sign of for now. Please re-engage as needs arise.   Dr. Christella Noa and Ms. Leverne Humbles have my personal cell phone number and has my permission to share this number at their discretion.   Secure message on Epic is also welcome but may not receive an immediate response.   Please reference AMION for on-call committee member if needed.   [place]

## 2021-10-11 DIAGNOSIS — I609 Nontraumatic subarachnoid hemorrhage, unspecified: Secondary | ICD-10-CM | POA: Diagnosis not present

## 2021-10-11 LAB — GLUCOSE, CAPILLARY
Glucose-Capillary: 264 mg/dL — ABNORMAL HIGH (ref 70–99)
Glucose-Capillary: 276 mg/dL — ABNORMAL HIGH (ref 70–99)
Glucose-Capillary: 289 mg/dL — ABNORMAL HIGH (ref 70–99)
Glucose-Capillary: 306 mg/dL — ABNORMAL HIGH (ref 70–99)
Glucose-Capillary: 328 mg/dL — ABNORMAL HIGH (ref 70–99)
Glucose-Capillary: 366 mg/dL — ABNORMAL HIGH (ref 70–99)
Glucose-Capillary: 383 mg/dL — ABNORMAL HIGH (ref 70–99)
Glucose-Capillary: 405 mg/dL — ABNORMAL HIGH (ref 70–99)
Glucose-Capillary: 419 mg/dL — ABNORMAL HIGH (ref 70–99)
Glucose-Capillary: 482 mg/dL — ABNORMAL HIGH (ref 70–99)
Glucose-Capillary: 531 mg/dL (ref 70–99)
Glucose-Capillary: 542 mg/dL (ref 70–99)

## 2021-10-11 LAB — RENAL FUNCTION PANEL
Albumin: 1.8 g/dL — ABNORMAL LOW (ref 3.5–5.0)
Anion gap: 8 (ref 5–15)
BUN: 52 mg/dL — ABNORMAL HIGH (ref 6–20)
CO2: 22 mmol/L (ref 22–32)
Calcium: 9.3 mg/dL (ref 8.9–10.3)
Chloride: 128 mmol/L — ABNORMAL HIGH (ref 98–111)
Creatinine, Ser: 2.36 mg/dL — ABNORMAL HIGH (ref 0.44–1.00)
GFR, Estimated: 25 mL/min — ABNORMAL LOW (ref >60)
Glucose, Bld: 438 mg/dL — ABNORMAL HIGH (ref 70–99)
Phosphorus: 4.1 mg/dL (ref 2.5–4.6)
Potassium: 5.3 mmol/L — ABNORMAL HIGH (ref 3.5–5.1)
Sodium: 158 mmol/L — ABNORMAL HIGH (ref 135–145)

## 2021-10-11 LAB — CBC
HCT: 30.7 % — ABNORMAL LOW (ref 36.0–46.0)
HCT: 29.2 % — ABNORMAL LOW (ref 36.0–46.0)
Hemoglobin: 9.6 g/dL — ABNORMAL LOW (ref 12.0–15.0)
Hemoglobin: 9 g/dL — ABNORMAL LOW (ref 12.0–15.0)
MCH: 29.8 pg (ref 26.0–34.0)
MCH: 29.4 pg (ref 26.0–34.0)
MCHC: 31.3 g/dL (ref 30.0–36.0)
MCHC: 30.8 g/dL (ref 30.0–36.0)
MCV: 95.3 fL (ref 80.0–100.0)
MCV: 95.4 fL (ref 80.0–100.0)
Platelets: 108 10*3/uL — ABNORMAL LOW (ref 150–400)
Platelets: 123 10*3/uL — ABNORMAL LOW (ref 150–400)
RBC: 3.22 MIL/uL — ABNORMAL LOW (ref 3.87–5.11)
RBC: 3.06 MIL/uL — ABNORMAL LOW (ref 3.87–5.11)
RDW: 14.7 % (ref 11.5–15.5)
RDW: 15.1 % (ref 11.5–15.5)
WBC: 10.7 10*3/uL — ABNORMAL HIGH (ref 4.0–10.5)
WBC: 9 10*3/uL (ref 4.0–10.5)
nRBC: 0.2 % (ref 0.0–0.2)
nRBC: 0.2 % (ref 0.0–0.2)

## 2021-10-11 LAB — BASIC METABOLIC PANEL
BUN: 52 mg/dL — ABNORMAL HIGH (ref 6–20)
CO2: 25 mmol/L (ref 22–32)
Calcium: 9.3 mg/dL (ref 8.9–10.3)
Chloride: >130 mmol/L (ref 98–111)
Creatinine, Ser: 2.08 mg/dL — ABNORMAL HIGH (ref 0.44–1.00)
GFR, Estimated: 29 mL/min — ABNORMAL LOW (ref >60)
Glucose, Bld: 418 mg/dL — ABNORMAL HIGH (ref 70–99)
Potassium: 4 mmol/L (ref 3.5–5.1)
Sodium: 166 mmol/L (ref 135–145)

## 2021-10-11 LAB — HEMOGLOBIN A1C
Hgb A1c MFr Bld: 5.3 % (ref 4.8–5.6)
Mean Plasma Glucose: 105.41 mg/dL

## 2021-10-11 LAB — MAGNESIUM
Magnesium: 3 mg/dL — ABNORMAL HIGH (ref 1.7–2.4)
Magnesium: 3.3 mg/dL — ABNORMAL HIGH (ref 1.7–2.4)

## 2021-10-11 LAB — SODIUM
Sodium: 168 mmol/L (ref 135–145)
Sodium: 166 mmol/L (ref 135–145)

## 2021-10-11 MED ORDER — INSULIN ASPART 100 UNIT/ML IJ SOLN
5.0000 [IU] | INTRAMUSCULAR | Status: DC
  Administered 2021-10-11: 5 [IU] via SUBCUTANEOUS

## 2021-10-11 MED ORDER — DEXTROSE IN LACTATED RINGERS 5 % IV SOLN: INTRAVENOUS | Status: DC

## 2021-10-11 MED ORDER — DESMOPRESSIN ACETATE 4 MCG/ML IJ SOLN
4.0000 ug | Freq: Once | INTRAMUSCULAR | Status: AC
  Administered 2021-10-11: 4 ug via INTRAVENOUS
  Filled 2021-10-11: qty 1

## 2021-10-11 MED ORDER — DEXTROSE 10 % IV SOLN: INTRAVENOUS | Status: DC

## 2021-10-11 MED ORDER — FREE WATER
200.0000 mL | Status: DC
  Administered 2021-10-11 – 2021-10-12 (×3): 200 mL

## 2021-10-11 MED ORDER — DEXTROSE 50 % IV SOLN: 0.0000 mL | INTRAVENOUS | Status: DC | PRN

## 2021-10-11 MED ORDER — INSULIN REGULAR(HUMAN) IN NACL 100-0.9 UT/100ML-% IV SOLN
INTRAVENOUS | Status: DC
  Administered 2021-10-11: 3.6 [IU]/h via INTRAVENOUS
  Administered 2021-10-12 (×2): 14 [IU]/h via INTRAVENOUS
  Administered 2021-10-13: 6.5 [IU]/h via INTRAVENOUS
  Filled 2021-10-11 (×4): qty 100

## 2021-10-11 MED ORDER — LACTATED RINGERS IV SOLN: INTRAVENOUS | Status: DC

## 2021-10-11 MED ORDER — METHYLPREDNISOLONE SODIUM SUCC 125 MG IJ SOLR
80.0000 mg | INTRAMUSCULAR | Status: DC
  Administered 2021-10-11: 80 mg via INTRAVENOUS
  Filled 2021-10-11: qty 2

## 2021-10-11 MED ORDER — FREE WATER
200.0000 mL | Freq: Four times a day (QID) | Status: DC
  Administered 2021-10-11 (×2): 200 mL

## 2021-10-11 NOTE — Progress Notes (Signed)
eLink Physician-Brief Progress Note Patient Name: [name] DOB: 01/04/1976 MRN: [id number]   Date of Service  10/11/2021  HPI/Events of Note  Hyperglycemia - Blood glucose = 366. Currently on [employer] insulin IV infusion protocol + Q 4 hour Novolog SSI  eICU Interventions  Will D/C Q 4 hour Novolog SSI.     Intervention Category Major Interventions: Hyperglycemia - active titration of insulin therapy  Lysle Dingwall 10/11/2021, 7:46 PM

## 2021-10-11 NOTE — Progress Notes (Signed)
eLink Physician-Brief Progress Note Patient Name: GARNELL BEGEMAN DOB: 1975/12/10 MRN: [id number]   Date of Service  10/11/2021  HPI/Events of Note  Hypernatremia - Na+ = 168 --> 166.  eICU Interventions  Plan: Increase free water per tube to Q 4 hours.      Intervention Category Major Interventions: Electrolyte abnormality - evaluation and management  Klinton Candelas [place] 10/11/2021, 10:21 PM

## 2021-10-11 NOTE — Progress Notes (Signed)
   10/10/21 2315  Provider Notification  Provider Name/Title Margaree Mackintosh  Date Provider Notified 10/10/21  Time Provider Notified 2315  Method of Notification Call  Notification Reason Change in status (Patient is hypertensive, levophed turned off. bear hugger removed, tachycardic, hypergylcemic)  Provider response See new orders  Date of Provider Response 10/10/21  Time of Provider Response 2325

## 2021-10-11 NOTE — Progress Notes (Signed)
NAME:  RANETTA ARMACOST, MRN:  [id number], DOB:  November 28, 1975, LOS: 9 ADMISSION DATE:  10/15/2021, CONSULTATION DATE: 10/22/2021 REFERRING MD: ED physician, CHIEF COMPLAINT: Subarachnoid hemorrhage  History of Present Illness:  SHARLON PFOHL is a 46 yo female with hypertension who presented to the ED after being found unresponsive by her husband. The patient was last seen at her baseline last night and this morning, around 6am, the patient's husband noted that she was "breathing funny" and making gurgling noises. He attempted to wake her and she was unresponsive and had his daughter call EMS. He started CPR and upon EMS arrival, the patient was in SVT and they performed DCCV. HR improved, although she had no improvement in her mental status.    GCS 4 on arrival to ED, proceeded with emergent intubation. Large SAH was noted on CTH. Neurosurg and PCCM consulted.    No family history of aneurysms known.   Pertinent  Medical History  Hypertension, uterine fibroids Legally blind in left eye  Significant Hospital Events: Including procedures, antibiotic start and stop dates in addition to other pertinent events   9/7-presented unresponsive, hypotensive 9/7-CT head with subarachnoid hemorrhage 9/11- DDAVP give for Central DI, repeat head CT with worsening infarcts and herniation 9/12- Attempted transfer for second opinion at [facility] and [facility].  No beds at [facility] and transfer was declined at [facility] 9/13-no change in mental status.  Transfer to [facility] declined 9/14- [facility] declined transfer again.  No change.  DDAVP x 1  Interim History / Subjective:   Off pressors.  Unresponsive intubated on life support.  Objective   Blood pressure (!) 143/65, pulse (!) 104, temperature 99.9 F (37.7 C), resp. rate (!) 24, height '5\' 2"'$  (1.575 m), weight 66 kg, SpO2 97 %.    Vent Mode: PRVC FiO2 (%):  [30 %] 30 % Set Rate:  [24 bmp] 24 bmp Vt Set:  [400 mL] 400 mL PEEP:  [5 cmH20] 5 cmH20 Plateau Pressure:   [17 cmH20-23 cmH20] 18 cmH20   Intake/Output Summary (Last 24 hours) at 10/11/2021 0744 Last data filed at 10/11/2021 0700 Gross per 24 hour  Intake 2658.76 ml  Output 2495 ml  Net 163.76 ml   Filed Weights   10/21/2021 0722 10/17/2021 1700 10/06/21 0500  Weight: 56.7 kg 66 kg 66 kg    Examination: General: Critically ill, intubated female on mechanical life support unresponsive. HEENT: Pupils fixed and dilated, absent corneal reflexes Neuro: No response to noxious stimuli CV: Regular rate rhythm, S1-S2 PULM: Bilateral mechanically ventilated breath sounds GI: Soft nontender nondistended Extremities: Pictures of rash documented yesterday, covered with dressings today.  Labs and imaging reviewed Sodium 158 Potassium 5.3 Serum creatinine improving now at 2.36  Resolved Hospital Problem list    Assessment & Plan:   Catastrophic subarachnoid hemorrhage H/H5 Overall poor prognosis Ongoing discussions with family Remains on Keppra and seizure prophylaxis  Hypotension with labile blood pressures Plan: Continue norepinephrine and maintain mean arterial pressure greater than 65 Continue midodrine 10 mg 3 times daily  Acute hypoxemic respiratory failure Concern for aspiration Plan: Has already completed 7 days of antibiotics stop date on 10/08/2021 Continue full mechanical vent support  Acute kidney injury Plan: Serum creatinine improving Continue to follow urine output Repeat BMP tomorrow.  Hypernatremia Continue free water with D5W  Protein calorie malnutrition Continue tube feeds  Bullous Rash  ?allergic reaction to adhesive? Seems to arose from skin that had contact with adhesive  Given antihistamines and steroids  Goals of care There has been ongoing discussions with family regarding goals of care Patient has been declined for transfer to [facility], [place] and [place]. Family is not in agreements for brain death testing.   Best Practice (right click and  "Reselect all SmartList Selections" daily)   Diet/type: tubefeeds DVT prophylaxis: SCD GI prophylaxis: PPI Lines: Central line- RUE PICC Foley:  N/A Code Status:  full code Last date of multidisciplinary goals of care discussion [discussed with family at bedside]> ongoing  Son updated at bedside 9/16  This patient is critically ill with multiple organ system failure; which, requires frequent high complexity decision making, assessment, support, evaluation, and titration of therapies. This was completed through the application of advanced monitoring technologies and extensive interpretation of multiple databases. During this encounter critical care time was devoted to patient care services described in this note for 31 minutes.  Garner Nash, DO [facility] 10/11/2021 7:44 AM

## 2021-10-11 NOTE — Progress Notes (Signed)
NEUROSURGERY PROGRESS NOTE  No change in neuro function. Comatose, fixed pupils and dilated, no response to noxious stimuli  Temp:  [95.9 F (35.5 C)-101.3 F (38.5 C)] 99.9 F (37.7 C) (09/16 0700) Pulse Rate:  [80-113] 104 (09/16 0700) Resp:  [21-24] 24 (09/16 0700) BP: (90-186)/(48-91) 143/65 (09/16 0700) SpO2:  [97 %-100 %] 97 % (09/16 0700) FiO2 (%):  [30 %] 30 % (09/16 0256)   Eleonore Chiquito, NP 10/11/2021 7:43 AM

## 2021-10-12 DIAGNOSIS — I609 Nontraumatic subarachnoid hemorrhage, unspecified: Secondary | ICD-10-CM | POA: Diagnosis not present

## 2021-10-12 LAB — SODIUM
Sodium: 167 mmol/L (ref 135–145)
Sodium: 167 mmol/L (ref 135–145)
Sodium: 165 mmol/L (ref 135–145)
Sodium: 164 mmol/L (ref 135–145)
Sodium: 163 mmol/L (ref 135–145)

## 2021-10-12 LAB — RENAL FUNCTION PANEL
Albumin: 1.8 g/dL — ABNORMAL LOW (ref 3.5–5.0)
BUN: 50 mg/dL — ABNORMAL HIGH (ref 6–20)
CO2: 23 mmol/L (ref 22–32)
Calcium: 9.3 mg/dL (ref 8.9–10.3)
Chloride: >130 mmol/L (ref 98–111)
Creatinine, Ser: 1.97 mg/dL — ABNORMAL HIGH (ref 0.44–1.00)
GFR, Estimated: 31 mL/min — ABNORMAL LOW (ref >60)
Glucose, Bld: 269 mg/dL — ABNORMAL HIGH (ref 70–99)
Phosphorus: 1.9 mg/dL — ABNORMAL LOW (ref 2.5–4.6)
Potassium: 4.2 mmol/L (ref 3.5–5.1)
Sodium: 166 mmol/L (ref 135–145)

## 2021-10-12 LAB — GLUCOSE, CAPILLARY
Glucose-Capillary: 124 mg/dL — ABNORMAL HIGH (ref 70–99)
Glucose-Capillary: 139 mg/dL — ABNORMAL HIGH (ref 70–99)
Glucose-Capillary: 152 mg/dL — ABNORMAL HIGH (ref 70–99)
Glucose-Capillary: 154 mg/dL — ABNORMAL HIGH (ref 70–99)
Glucose-Capillary: 160 mg/dL — ABNORMAL HIGH (ref 70–99)
Glucose-Capillary: 161 mg/dL — ABNORMAL HIGH (ref 70–99)
Glucose-Capillary: 163 mg/dL — ABNORMAL HIGH (ref 70–99)
Glucose-Capillary: 167 mg/dL — ABNORMAL HIGH (ref 70–99)
Glucose-Capillary: 173 mg/dL — ABNORMAL HIGH (ref 70–99)
Glucose-Capillary: 181 mg/dL — ABNORMAL HIGH (ref 70–99)
Glucose-Capillary: 185 mg/dL — ABNORMAL HIGH (ref 70–99)
Glucose-Capillary: 193 mg/dL — ABNORMAL HIGH (ref 70–99)
Glucose-Capillary: 197 mg/dL — ABNORMAL HIGH (ref 70–99)
Glucose-Capillary: 204 mg/dL — ABNORMAL HIGH (ref 70–99)
Glucose-Capillary: 208 mg/dL — ABNORMAL HIGH (ref 70–99)
Glucose-Capillary: 210 mg/dL — ABNORMAL HIGH (ref 70–99)
Glucose-Capillary: 211 mg/dL — ABNORMAL HIGH (ref 70–99)
Glucose-Capillary: 218 mg/dL — ABNORMAL HIGH (ref 70–99)
Glucose-Capillary: 241 mg/dL — ABNORMAL HIGH (ref 70–99)
Glucose-Capillary: 242 mg/dL — ABNORMAL HIGH (ref 70–99)
Glucose-Capillary: 250 mg/dL — ABNORMAL HIGH (ref 70–99)
Glucose-Capillary: 253 mg/dL — ABNORMAL HIGH (ref 70–99)
Glucose-Capillary: 256 mg/dL — ABNORMAL HIGH (ref 70–99)

## 2021-10-12 MED ORDER — FREE WATER
400.0000 mL | Status: DC
  Administered 2021-10-12 – 2021-10-15 (×17): 400 mL

## 2021-10-12 MED ORDER — METHYLPREDNISOLONE SODIUM SUCC 40 MG IJ SOLR
20.0000 mg | INTRAMUSCULAR | Status: DC
  Administered 2021-10-12: 20 mg via INTRAVENOUS
  Filled 2021-10-12: qty 1

## 2021-10-12 MED ORDER — DEXTROSE 5 % IV BOLUS
500.0000 mL | Freq: Once | INTRAVENOUS | Status: AC
  Administered 2021-10-12: 500 mL via INTRAVENOUS

## 2021-10-12 MED ORDER — AMLODIPINE BESYLATE 5 MG PO TABS
5.0000 mg | ORAL_TABLET | Freq: Every day | ORAL | Status: DC
  Administered 2021-10-12: 5 mg
  Filled 2021-10-12 (×2): qty 1

## 2021-10-12 NOTE — Progress Notes (Signed)
NAME:  [name], MRN:  [id number], DOB:  07-03-1975, LOS: 57 ADMISSION DATE:  10/16/2021, CONSULTATION DATE: 09/26/2021 REFERRING MD: ED physician, CHIEF COMPLAINT: Subarachnoid hemorrhage  History of Present Illness:  [name] is a 46 yo female with hypertension who presented to the ED after being found unresponsive by her husband. The patient was last seen at her baseline last night and this morning, around 6am, the patient's husband noted that she was "breathing funny" and making gurgling noises. He attempted to wake her and she was unresponsive and had his daughter call EMS. He started CPR and upon EMS arrival, the patient was in SVT and they performed DCCV. HR improved, although she had no improvement in her mental status.    GCS 4 on arrival to ED, proceeded with emergent intubation. Large SAH was noted on CTH. Neurosurg and PCCM consulted.    No family history of aneurysms known.   Pertinent  Medical History  Hypertension, uterine fibroids Legally blind in left eye  Significant Hospital Events: Including procedures, antibiotic start and stop dates in addition to other pertinent events   9/7-presented unresponsive, hypotensive 9/7-CT head with subarachnoid hemorrhage 9/11- DDAVP give for Central DI, repeat head CT with worsening infarcts and herniation 9/12- Attempted transfer for second opinion at [facility] and [facility].  No beds at [facility] and transfer was declined at [facility] 9/13-no change in mental status.  Transfer to [facility] declined 9/14- [facility] declined transfer again.  No change.  DDAVP x 1  Interim History / Subjective:   Remains critically ill intubated on mechanical life support off pressors  Objective   Blood pressure (!) 140/73, pulse 93, temperature 99.9 F (37.7 C), resp. rate (!) 24, height '5\' 2"'$  (1.575 m), weight 66 kg, SpO2 97 %.    Vent Mode: PRVC FiO2 (%):  [30 %] 30 % Set Rate:  [24 bmp] 24 bmp Vt Set:  [400 mL] 400 mL PEEP:  [5 cmH20] 5  cmH20 Plateau Pressure:  [16 cmH20-19 cmH20] 16 cmH20   Intake/Output Summary (Last 24 hours) at 10/12/2021 0752 Last data filed at 10/12/2021 0700 Gross per 24 hour  Intake 3248.83 ml  Output 4125 ml  Net -876.17 ml   Filed Weights   09/28/2021 0722 10/15/2021 1700 10/06/21 0500  Weight: 56.7 kg 66 kg 66 kg    Examination: General: Critically ill intubated female mechanical life support HEENT: Pupils fixed dilated, absent corneals Neuro: Posturing with noxious stimuli to the feet CV: Regular rhythm S1-S2 PULM: Lateral mechanically ventilated breath sounds GI: Soft nontender nondistended Extremities: Bullous appearing rash around bandages, covered with nonadherent dressings  Labs and imaging reviewed [facility] Problem list    Assessment & Plan:   Catastrophic subarachnoid hemorrhage H/H5 Overall poor prognosis Ongoing family discussions Remains on Keppra for seizure prophylaxis Brain death testing and determination per neurosurgery  Hypotension with labile blood pressures, resolved Plan: Hold midodrine Hypotension has resolved  Acute hypoxemic respiratory failure Concern for aspiration Plan: Continue full mechanical vent support  Acute kidney injury Plan: Follow serum creatinine, follow urine output Repeat BMP  Hypernatremia Increase free water Also on D10  Protein calorie malnutrition Continue tube feeds  Bullous Rash  ?allergic reaction to adhesive? Patient was given antihistamines and steroids Covered with nonadherent dressings  Goals of care There has been ongoing discussions with family regarding goals of care Patient has been declined for transfer to [facility], [place] and [place]. Family is not in agreements for  brain death testing. Will need ongoing goals of care discussion multidisciplinary team with palliative care.   Best Practice (right click and "Reselect all SmartList Selections" daily)   Diet/type: tubefeeds DVT  prophylaxis: SCD GI prophylaxis: PPI Lines: Central line- RUE PICC Foley:  N/A Code Status:  full code Last date of multidisciplinary goals of care discussion [discussed with family at bedside]> ongoing  Son updated at bedside 9/16  This patient is critically ill with multiple organ system failure; which, requires frequent high complexity decision making, assessment, support, evaluation, and titration of therapies. This was completed through the application of advanced monitoring technologies and extensive interpretation of multiple databases. During this encounter critical care time was devoted to patient care services described in this note for 31 minutes.  Garner Nash, DO [facility] 10/12/2021 7:53 AM

## 2021-10-12 NOTE — Progress Notes (Signed)
Patient ID: [name], female   DOB: 10-08-1975, 46 y.o.   MRN: [id number] Patient remains intubated with fixed and dilated pupils, no gag, no corneals and no response to deep noxious stimuli.  Prognosis grim.

## 2021-10-12 NOTE — Progress Notes (Signed)
Patient remains on Endotool/Insulin gtt. Most recent blood sugar 154, endo tool recommending transitioning to SQ. CCM aware, DC D5LR IVF, continue D10 with rate increase to 50cc/hr, restart tube feeds, continue Insulin gtt for now. Patient remains hypernatremic 167, Na levels q4 hours as ordered. Repeat level pending.

## 2021-10-13 DIAGNOSIS — E232 Diabetes insipidus: Secondary | ICD-10-CM | POA: Diagnosis not present

## 2021-10-13 DIAGNOSIS — S066X9A Traumatic subarachnoid hemorrhage with loss of consciousness of unspecified duration, initial encounter: Secondary | ICD-10-CM

## 2021-10-13 DIAGNOSIS — Z515 Encounter for palliative care: Secondary | ICD-10-CM

## 2021-10-13 DIAGNOSIS — Z7189 Other specified counseling: Secondary | ICD-10-CM

## 2021-10-13 DIAGNOSIS — I609 Nontraumatic subarachnoid hemorrhage, unspecified: Secondary | ICD-10-CM | POA: Diagnosis not present

## 2021-10-13 DIAGNOSIS — J9601 Acute respiratory failure with hypoxia: Secondary | ICD-10-CM | POA: Diagnosis not present

## 2021-10-13 LAB — GLUCOSE, CAPILLARY
Glucose-Capillary: 117 mg/dL — ABNORMAL HIGH (ref 70–99)
Glucose-Capillary: 122 mg/dL — ABNORMAL HIGH (ref 70–99)
Glucose-Capillary: 123 mg/dL — ABNORMAL HIGH (ref 70–99)
Glucose-Capillary: 130 mg/dL — ABNORMAL HIGH (ref 70–99)
Glucose-Capillary: 133 mg/dL — ABNORMAL HIGH (ref 70–99)
Glucose-Capillary: 136 mg/dL — ABNORMAL HIGH (ref 70–99)
Glucose-Capillary: 136 mg/dL — ABNORMAL HIGH (ref 70–99)
Glucose-Capillary: 138 mg/dL — ABNORMAL HIGH (ref 70–99)
Glucose-Capillary: 138 mg/dL — ABNORMAL HIGH (ref 70–99)
Glucose-Capillary: 141 mg/dL — ABNORMAL HIGH (ref 70–99)
Glucose-Capillary: 143 mg/dL — ABNORMAL HIGH (ref 70–99)
Glucose-Capillary: 145 mg/dL — ABNORMAL HIGH (ref 70–99)
Glucose-Capillary: 147 mg/dL — ABNORMAL HIGH (ref 70–99)
Glucose-Capillary: 153 mg/dL — ABNORMAL HIGH (ref 70–99)
Glucose-Capillary: 153 mg/dL — ABNORMAL HIGH (ref 70–99)
Glucose-Capillary: 154 mg/dL — ABNORMAL HIGH (ref 70–99)
Glucose-Capillary: 159 mg/dL — ABNORMAL HIGH (ref 70–99)
Glucose-Capillary: 159 mg/dL — ABNORMAL HIGH (ref 70–99)
Glucose-Capillary: 164 mg/dL — ABNORMAL HIGH (ref 70–99)
Glucose-Capillary: 164 mg/dL — ABNORMAL HIGH (ref 70–99)
Glucose-Capillary: 173 mg/dL — ABNORMAL HIGH (ref 70–99)
Glucose-Capillary: 175 mg/dL — ABNORMAL HIGH (ref 70–99)
Glucose-Capillary: 178 mg/dL — ABNORMAL HIGH (ref 70–99)

## 2021-10-13 LAB — BASIC METABOLIC PANEL
BUN: 45 mg/dL — ABNORMAL HIGH (ref 6–20)
CO2: 25 mmol/L (ref 22–32)
Calcium: 8.9 mg/dL (ref 8.9–10.3)
Chloride: >130 mmol/L (ref 98–111)
Creatinine, Ser: 1.5 mg/dL — ABNORMAL HIGH (ref 0.44–1.00)
GFR, Estimated: 43 mL/min — ABNORMAL LOW (ref >60)
Glucose, Bld: 126 mg/dL — ABNORMAL HIGH (ref 70–99)
Potassium: 4 mmol/L (ref 3.5–5.1)
Sodium: 163 mmol/L (ref 135–145)

## 2021-10-13 LAB — CBC
HCT: 28.3 % — ABNORMAL LOW (ref 36.0–46.0)
Hemoglobin: 8.4 g/dL — ABNORMAL LOW (ref 12.0–15.0)
MCH: 29.2 pg (ref 26.0–34.0)
MCHC: 29.7 g/dL — ABNORMAL LOW (ref 30.0–36.0)
MCV: 98.3 fL (ref 80.0–100.0)
Platelets: 146 10*3/uL — ABNORMAL LOW (ref 150–400)
RBC: 2.88 MIL/uL — ABNORMAL LOW (ref 3.87–5.11)
RDW: 15.1 % (ref 11.5–15.5)
WBC: 11.6 10*3/uL — ABNORMAL HIGH (ref 4.0–10.5)
nRBC: 0.3 % — ABNORMAL HIGH (ref 0.0–0.2)

## 2021-10-13 LAB — RENAL FUNCTION PANEL
Albumin: 1.8 g/dL — ABNORMAL LOW (ref 3.5–5.0)
BUN: 45 mg/dL — ABNORMAL HIGH (ref 6–20)
CO2: 26 mmol/L (ref 22–32)
Calcium: 8.9 mg/dL (ref 8.9–10.3)
Chloride: >130 mmol/L (ref 98–111)
Creatinine, Ser: 1.46 mg/dL — ABNORMAL HIGH (ref 0.44–1.00)
GFR, Estimated: 44 mL/min — ABNORMAL LOW (ref >60)
Glucose, Bld: 132 mg/dL — ABNORMAL HIGH (ref 70–99)
Phosphorus: 2.6 mg/dL (ref 2.5–4.6)
Potassium: 4.1 mmol/L (ref 3.5–5.1)
Sodium: 164 mmol/L (ref 135–145)

## 2021-10-13 LAB — SODIUM
Sodium: 166 mmol/L (ref 135–145)
Sodium: 166 mmol/L (ref 135–145)
Sodium: 163 mmol/L (ref 135–145)
Sodium: 161 mmol/L (ref 135–145)

## 2021-10-13 LAB — MAGNESIUM: Magnesium: 3.1 mg/dL — ABNORMAL HIGH (ref 1.7–2.4)

## 2021-10-13 MED ORDER — DEXTROSE 10 % IV SOLN: INTRAVENOUS | Status: DC

## 2021-10-13 MED ORDER — PANTOPRAZOLE 2 MG/ML SUSPENSION
40.0000 mg | Freq: Every day | ORAL | Status: DC
  Administered 2021-10-14 – 2021-11-24 (×39): 40 mg
  Filled 2021-10-13 (×40): qty 20

## 2021-10-13 MED ORDER — DESMOPRESSIN ACETATE 4 MCG/ML IJ SOLN
4.0000 ug | Freq: Once | INTRAMUSCULAR | Status: AC
  Administered 2021-10-13: 4 ug via INTRAVENOUS
  Filled 2021-10-13: qty 1

## 2021-10-13 NOTE — Progress Notes (Signed)
Pt CBG 141. CCM aware of endotool recommending transitioning to SQ. Pt still Hypernatremic at 163, and receiving 400 ml Q hr free water flushes. Per MD, will continue with insulin gtt for now overnight due to possible restart of dextrose fluids. Continue with pt care plan. Will let dayshift reevaluate.   Royal Piedra, RN

## 2021-10-13 NOTE — Inpatient Diabetes Management (Signed)
Inpatient Diabetes Program Recommendations  AACE/ADA: New Consensus Statement on Inpatient Glycemic Control (2015)  Target Ranges:  Prepandial:   less than 140 mg/dL      Peak postprandial:   less than 180 mg/dL (1-2 hours)      Critically ill patients:  140 - 180 mg/dL   Lab Results  Component Value Date   GLUCAP 164 (H) 10/13/2021   HGBA1C 5.3 10/11/2021    Review of Glycemic Control  Poor prognosis  Recommend pt remain on IV insulin throughout goals of care discussions. Insulin gtt rates are high due to the level that pt is critically ill.   Thanks,  Tama Headings RN, MSN, BC-ADM Inpatient Diabetes Coordinator Team Pager [phone] (8a-5p)

## 2021-10-13 NOTE — Progress Notes (Signed)
Date and time results received: 10/13/21 2010   Test: Na  Critical Value: 161  Name of Provider Notified: E-link

## 2021-10-13 NOTE — Progress Notes (Signed)
eLink Physician-Brief Progress Note Patient Name: [name] DOB: 06/14/1975 MRN: [id number]   Date of Service  10/13/2021  HPI/Events of Note  Glucose downtrending on endotool.  Sodium remains elevated at 163  eICU Interventions  Will restart D10 fluids for now.  Will continue to monitor serial Sodiums and adjust fluids as warranted.         [place] 10/13/2021, 3:11 AM

## 2021-10-13 NOTE — Progress Notes (Signed)
Date and time results received: 10/13/21 2:40 PM  (use smartphrase ".now" to insert current time)  Test: Na+ Critical Value: 163  Name of Provider Notified: Jacky Kindle MD  Orders Received? Or Actions Taken?: No further orders.

## 2021-10-13 NOTE — Consult Note (Signed)
Palliative Care Consult Note                                  Date: 10/13/2021   Patient Name: [name]  DOB: 08/07/1975  MRN: [id number]  Age / Sex: 46 y.o., female  PCP: Glendon Axe, MD Referring Physician: Ashok Pall, MD  Reason for Consultation: Establishing goals of care  HPI/Patient Profile: 46 y.o. female  with past medical history of hypertension who presented to the ED after being found unresponsive by her husband. The patient was last seen at her baseline last night and this morning, around 6am, the patient's husband noted that she was "breathing funny" and making gurgling noises. He attempted to wake her and she was unresponsive and had his daughter call EMS. He started CPR and upon EMS arrival, the patient was in SVT and they performed DCCV. HR improved, although she had no improvement in her mental status.    GCS 4 on arrival to ED, proceeded with emergent intubation. Large SAH was noted on CTH. Neurosurg and PCCM consulted. She was admitted on 10/10/2021 with catastrophic subarachnoid hemorrhage, acute hypoxemic respiratory failure, concern for aspiration pneumonia, AKI, central diabetes insipidus, hypernatremia, and others.   PCCM ongoing discussions with the family related to goals of care.  They requested transfer for second opinion but have been declined by [facility], [place], and [place].  Family is not in agreement for brain death testing.  PMT was consulted for goals of care conversations.  Past Medical History:  Diagnosis Date   Blind left eye    Fibroid    Hypertension    Vaginal Pap smear, abnormal    Visual loss, right eye    Vitamin D deficiency     Subjective:   This NP Walden Field reviewed medical records, received report from team, assessed the patient and then meet at the patient's bedside to discuss diagnosis, prognosis, GOC, EOL wishes disposition and options.  I met with the patient's son  [name] at the bedside.  No other family was present.   Concept of Palliative Care was introduced as specialized medical care for people and their families living with serious illness.  If focuses on providing relief from the symptoms and stress of a serious illness.  The goal is to improve quality of life for both the patient and the family. Values and goals of care important to patient and family were attempted to be elicited.  Family is not very communicative today, not interested in discussing goals of care at this time.  Created space and opportunity for patient  and family to explore thoughts and feelings regarding current medical situation   Natural trajectory and current clinical status were discussed. Questions and concerns addressed. Patient  encouraged to call with questions or concerns.    Patient/Family Understanding of Illness: The patient's son was not very interested in discussing details on the patient's condition.  He states that they have been told what is going on by the ICU doctors and brain surgeons.  Life Review: Deferred  Patient Values: Deferred  Goals: Deferred  Today's Discussion: The patient's son states that "were grieving" and he knows the docs cannot do much more and they are going to "let it be in God's hands."  Still held out hope and are taking it day-to-day.  He notes that the patient's mom's first lady of a church and stepdad is a  pastor.  She does identify as [place] but they have moved more from religious to spiritual recently.  We offered chaplain services but he states "we have all the pros we need."  I provided a copy of our contact card should [name] have any questions or concerns or simply need to talk.  I again emphasized that we are here to help support them as they are traveling this difficult and painful journey with the patient.  He states that "other family members may have more questions."  I offered that I would return tomorrow and attempt  to meet with other family members in order to help support him as best as we can.  I provided emotional and general support through therapeutic listening, empathy, sharing of stories, and other techniques. I answered all questions and addressed all concerns to the best of my ability.  Review of Systems  Unable to perform ROS: Intubated    Objective:   Primary Diagnoses: Present on Admission:  Subarachnoid hematoma ([place])   Physical Exam Vitals and nursing note reviewed.  Constitutional:      General: She is not in acute distress.    Appearance: She is ill-appearing.     Interventions: She is intubated.  HENT:     Head: Normocephalic and atraumatic.  Cardiovascular:     Rate and Rhythm: Normal rate.  Pulmonary:     Effort: She is intubated.  Abdominal:     General: Abdomen is flat. Bowel sounds are decreased.     Palpations: Abdomen is soft.  Skin:    General: Skin is warm and dry.  Neurological:     Mental Status: She is unresponsive.     Vital Signs:  BP (!) 108/56   Pulse 83   Temp 97.7 F (36.5 C)   Resp (!) 24   Ht _0  (1.575 m)   Wt 66 kg   SpO2 99%   BMI 26.61 kg/m   Palliative Assessment/Data: 10%    Advanced Care Planning:   Primary Decision Maker: NEXT OF KIN  Code Status/Advance Care Planning: Full code  A discussion was had today regarding advanced directives. Concepts specific to code status, artifical feeding and hydration, continued IV antibiotics and rehospitalization was had.  The difference between a aggressive medical intervention path and a palliative comfort care path for this patient at this time was had.   Decisions/Changes to ACP: None today  Assessment & Plan:   Impression: 46 year old female with catastrophic subarachnoid hemorrhage and apparent impending herniation.  She is unresponsive at this time.  Neurosurgery is following.  Family seems to be grieving substantially and having difficulty excepting the patient's  current condition.  They have requested transfer for second opinion but all tertiary care centers in the area have declined to accept her.  Family is currently against brain death testing.  Given the degree of her injury and ramifications subsequent to, overall prognosis is grave.  SUMMARY OF RECOMMENDATIONS   Continue to contact with various family members Continue support of patient and family Continue attempts to establish a relationship to help with grieving and support PMT will continue to follow  Symptom Management:  Per primary team PMT is available to assist as needed  Prognosis:  Unable to determine  Discharge Planning:  To Be Determined   Discussed with: Patient's family, medical team, nursing team    Thank you for allowing [place] to participate in the care of KALLEY NICHOLL PMT will continue to support holistically.  Time Total:  75 min  Greater than 50%  of this time was spent counseling and coordinating care related to the above assessment and plan.  Signed by: Walden Field, NP Palliative Medicine Team  Team Phone # [phone] (Nights/Weekends)  10/13/2021, 3:08 PM

## 2021-10-13 NOTE — Progress Notes (Signed)
NAME:  [name], MRN:  [id number], DOB:  Nov 29, 1975, LOS: 81 ADMISSION DATE:  10/16/2021, CONSULTATION DATE: 10/22/2021 REFERRING MD: ED physician, CHIEF COMPLAINT: Subarachnoid hemorrhage  History of Present Illness:  [name] is a 46 yo female with hypertension who presented to the ED after being found unresponsive by her husband. The patient was last seen at her baseline last night and this morning, around 6am, the patient's husband noted that she was "breathing funny" and making gurgling noises. He attempted to wake her and she was unresponsive and had his daughter call EMS. He started CPR and upon EMS arrival, the patient was in SVT and they performed DCCV. HR improved, although she had no improvement in her mental status.    GCS 4 on arrival to ED, proceeded with emergent intubation. Large SAH was noted on CTH. Neurosurg and PCCM consulted.    No family history of aneurysms known.   Pertinent  Medical History  Hypertension, uterine fibroids Legally blind in left eye  Significant Hospital Events: Including procedures, antibiotic start and stop dates in addition to other pertinent events   9/7-presented unresponsive, hypotensive 9/7-CT head with subarachnoid hemorrhage 9/11- DDAVP give for Central DI, repeat head CT with worsening infarcts and herniation 9/12- Attempted transfer for second opinion at [facility] and [facility].  No beds at [facility] and transfer was declined at [facility] 9/13-no change in mental status.  Transfer to [facility] declined 9/14- [facility] declined transfer again.  No change.  DDAVP x 1  Interim History / Subjective:   Remains critically ill intubated on mechanical life support off pressors Serum sodium remain in 160s She is hypothermic  Objective   Blood pressure (!) 135/59, pulse 85, temperature 98.4 F (36.9 C), resp. rate (!) 24, height '5\' 2"'$  (1.575 m), weight 66 kg, SpO2 98 %.    Vent Mode: PRVC FiO2 (%):  [30 %] 30 % Set Rate:  [24 bmp] 24 bmp Vt Set:   [400 mL] 400 mL PEEP:  [5 cmH20] 5 cmH20 Plateau Pressure:  [16 cmH20-18 cmH20] 16 cmH20   Intake/Output Summary (Last 24 hours) at 10/13/2021 0756 Last data filed at 10/13/2021 0726 Gross per 24 hour  Intake 4452.24 ml  Output 2085 ml  Net 2367.24 ml   Filed Weights   10/20/2021 0722 10/15/2021 1700 10/06/21 0500  Weight: 56.7 kg 66 kg 66 kg    Examination:   Physical exam: General: Crtitically ill-appearing female, orally intubated HEENT: [place]/AT, eyes anicteric.  ETT and OGT in place Neuro: Eyes closed, does not open, not following commands.  Absent pupillary, corneal, cough, gag reflexes.  Triple flexion in bilateral lower extremity to painful stimuli Chest: Coarse breath sounds, no wheezes or rhonchi Heart: Regular rate and rhythm, no murmurs or gallops Abdomen: Soft, nontender, nondistended, bowel sounds present Skin: No rash   Labs and imaging reviewed Sodium 164  Resolved Hospital Problem list    Assessment & Plan:   Catastrophic subarachnoid hemorrhage H/H5, MF 4 No change in neurological exam, now she is losing brainstem reflexes Ongoing goals of care discussion with family Overall poor prognosis Remains on Keppra for seizure prophylaxis Patient serum sodium is elevated and she is hypothermic, cannot perform brain death testing for now  Acute hypoxemic respiratory failure Concern for aspiration pneumonia Continue full mechanical vent support Completed treatment with Unasyn  Acute kidney injury Central diabetes insipidus Hypernatremia Likely in the setting of severe dehydration caused by central diabetes insipidus Serum creatinine remain elevated We will  give her DDAVP Continue D5 and free water, trying to slowly correct serum sodium Repeat BMP  Protein calorie malnutrition Continue tube feeds  Bullous Rash  ?allergic reaction to adhesive? Patient was given antihistamines and steroids Covered with nonadherent dressings  Goals of care There has been  ongoing discussions with family regarding goals of care Patient has been declined for transfer to [facility], [place] and [place]. Family is not in agreements for brain death testing. Will need ongoing goals of care discussion multidisciplinary team with palliative care.   Best Practice (right click and "Reselect all SmartList Selections" daily)   Diet/type: tubefeeds DVT prophylaxis: SCD GI prophylaxis: PPI Lines: Central line- RUE PICC Foley:  N/A Code Status:  full code Last date of multidisciplinary goals of care discussion [discussed with family at bedside]> ongoing  Son updated at bedside 9/16   Total critical care time: 35 minutes  Performed by: [place] care time was exclusive of separately billable procedures and treating other patients.   Critical care was necessary to treat or prevent imminent or life-threatening deterioration.   Critical care was time spent personally by me on the following activities: development of treatment plan with patient and/or surrogate as well as nursing, discussions with consultants, evaluation of patient's response to treatment, examination of patient, obtaining history from patient or surrogate, ordering and performing treatments and interventions, ordering and review of laboratory studies, ordering and review of radiographic studies, pulse oximetry and re-evaluation of patient's condition.   Jacky Kindle, MD [place] [facility] See Amion for pager If no response to pager, please call [phone] until 7pm After 7pm, Please call E-link [phone]

## 2021-10-13 NOTE — Progress Notes (Signed)
Patient ID: [name], female   DOB: [birth year], 46 y.o.   MRN: [id number] BP (!) 121/58   Pulse 72   Temp (!) 96.8 F (36 C)   Resp (!) 24   Ht '5\' 2"'$  (1.575 m)   Wt 66 kg   SpO2 99%   BMI 26.61 kg/m  Comatose, pupils dilated, not reactive to light No neurological change noted There is no improvement noted since admission. Prognosis grave

## 2021-10-14 DIAGNOSIS — R4182 Altered mental status, unspecified: Secondary | ICD-10-CM

## 2021-10-14 DIAGNOSIS — I609 Nontraumatic subarachnoid hemorrhage, unspecified: Secondary | ICD-10-CM | POA: Diagnosis not present

## 2021-10-14 DIAGNOSIS — Z515 Encounter for palliative care: Secondary | ICD-10-CM | POA: Diagnosis not present

## 2021-10-14 DIAGNOSIS — G935 Compression of brain: Secondary | ICD-10-CM

## 2021-10-14 DIAGNOSIS — J9601 Acute respiratory failure with hypoxia: Secondary | ICD-10-CM | POA: Diagnosis not present

## 2021-10-14 DIAGNOSIS — Z7189 Other specified counseling: Secondary | ICD-10-CM | POA: Diagnosis not present

## 2021-10-14 LAB — GLUCOSE, CAPILLARY
Glucose-Capillary: 103 mg/dL — ABNORMAL HIGH (ref 70–99)
Glucose-Capillary: 104 mg/dL — ABNORMAL HIGH (ref 70–99)
Glucose-Capillary: 105 mg/dL — ABNORMAL HIGH (ref 70–99)
Glucose-Capillary: 105 mg/dL — ABNORMAL HIGH (ref 70–99)
Glucose-Capillary: 110 mg/dL — ABNORMAL HIGH (ref 70–99)
Glucose-Capillary: 115 mg/dL — ABNORMAL HIGH (ref 70–99)
Glucose-Capillary: 117 mg/dL — ABNORMAL HIGH (ref 70–99)
Glucose-Capillary: 118 mg/dL — ABNORMAL HIGH (ref 70–99)
Glucose-Capillary: 122 mg/dL — ABNORMAL HIGH (ref 70–99)
Glucose-Capillary: 126 mg/dL — ABNORMAL HIGH (ref 70–99)
Glucose-Capillary: 128 mg/dL — ABNORMAL HIGH (ref 70–99)
Glucose-Capillary: 135 mg/dL — ABNORMAL HIGH (ref 70–99)
Glucose-Capillary: 136 mg/dL — ABNORMAL HIGH (ref 70–99)
Glucose-Capillary: 137 mg/dL — ABNORMAL HIGH (ref 70–99)
Glucose-Capillary: 140 mg/dL — ABNORMAL HIGH (ref 70–99)
Glucose-Capillary: 147 mg/dL — ABNORMAL HIGH (ref 70–99)
Glucose-Capillary: 155 mg/dL — ABNORMAL HIGH (ref 70–99)
Glucose-Capillary: 162 mg/dL — ABNORMAL HIGH (ref 70–99)
Glucose-Capillary: 304 mg/dL — ABNORMAL HIGH (ref 70–99)
Glucose-Capillary: 96 mg/dL (ref 70–99)

## 2021-10-14 LAB — BASIC METABOLIC PANEL
Anion gap: 3 — ABNORMAL LOW (ref 5–15)
BUN: 38 mg/dL — ABNORMAL HIGH (ref 6–20)
CO2: 27 mmol/L (ref 22–32)
Calcium: 8.4 mg/dL — ABNORMAL LOW (ref 8.9–10.3)
Chloride: 128 mmol/L — ABNORMAL HIGH (ref 98–111)
Creatinine, Ser: 1.08 mg/dL — ABNORMAL HIGH (ref 0.44–1.00)
GFR, Estimated: >60 mL/min (ref >60)
Glucose, Bld: 145 mg/dL — ABNORMAL HIGH (ref 70–99)
Potassium: 3.7 mmol/L (ref 3.5–5.1)
Sodium: 158 mmol/L — ABNORMAL HIGH (ref 135–145)

## 2021-10-14 LAB — OSMOLALITY, URINE
Osmolality, Ur: 267 mOsm/kg — ABNORMAL LOW (ref 300–900)
Osmolality, Ur: 727 mOsm/kg (ref 300–900)
Osmolality, Ur: 722 mOsm/kg (ref 300–900)
Osmolality, Ur: 706 mOsm/kg (ref 300–900)

## 2021-10-14 LAB — SODIUM
Sodium: 159 mmol/L — ABNORMAL HIGH (ref 135–145)
Sodium: 157 mmol/L — ABNORMAL HIGH (ref 135–145)
Sodium: 156 mmol/L — ABNORMAL HIGH (ref 135–145)
Sodium: 155 mmol/L — ABNORMAL HIGH (ref 135–145)
Sodium: 150 mmol/L — ABNORMAL HIGH (ref 135–145)

## 2021-10-14 MED ORDER — DESMOPRESSIN ACETATE 4 MCG/ML IJ SOLN
4.0000 ug | Freq: Once | INTRAMUSCULAR | Status: AC
  Administered 2021-10-14: 4 ug via INTRAVENOUS
  Filled 2021-10-14: qty 1

## 2021-10-14 MED ORDER — SODIUM CHLORIDE 0.45 % IV BOLUS: 350.0000 mL | INTRAVENOUS | Status: DC | PRN

## 2021-10-14 NOTE — Progress Notes (Signed)
Nutrition Follow-up  DOCUMENTATION CODES:   Not applicable  INTERVENTION:  Continue TF via OG tube: Osmolite 1.5 at 50 ml/h (1200 ml per day) Prosource TF20 60 ml daily   Provides 1880 kcal, 95 gm protein, 912 ml free water daily    MD to adjust free water flushes.   NUTRITION DIAGNOSIS:   Inadequate oral intake related to inability to eat as evidenced by NPO status.  Ongoing  GOAL:   Patient will meet greater than or equal to 90% of their needs  Goal met via TF  MONITOR:   TF tolerance  REASON FOR ASSESSMENT:   Consult Enteral/tube feeding initiation and management  ASSESSMENT:   Pt with PMH of HTN and legally blind in L eye admitted after being found down by husband with CPR. Per CT pt with large SAH.  Per MD, palliative working with family to perform brain death testing.   Tolerating tube feeding at goal rate.   Patient is currently intubated on ventilator support MV: 9.7 L/min Temp (24hrs), Avg:97.8 F (36.6 C), Min:96.8 F (36 C), Max:98.6 F (37 C)  Edema: moderate pitting BUE  Medications: protonix, miralax (not given), senna (not given) IV drips: D10 @ 76m/hr, human insulin    Labs: sodium 158 (H), BUN 38, Cr 1.08, CBG's 96-147 x24 hours  UOP: 1.6L x24 hours I/O's: +4.7L since admission  Diet Order:   Diet Order             Diet NPO time specified  Diet effective now                   EDUCATION NEEDS:   No education needs have been identified at this time  Skin:  Skin Assessment: Reviewed RN Assessment  Last BM:  9/16  Height:   Ht Readings from Last 1 Encounters:  10/07/21 5' 2"  (1.575 m)    Weight:   Wt Readings from Last 1 Encounters:  10/06/21 66 kg   BMI:  Body mass index is 26.61 kg/m.  Estimated Nutritional Needs:   Kcal:  1700-1900  Protein:  85-100 grams  Fluid:  >1.7 L/day  AClayborne Dana RDN, LDN Clinical Nutrition

## 2021-10-14 NOTE — Progress Notes (Signed)
Per Dr. Tamala Julian continue with the current treatment and hold off on the urine replacement with 1/4NS. Will continue to monitor patient and notify CCM of any increases in sodium.

## 2021-10-14 NOTE — Progress Notes (Signed)
Spoke with the pharmacist, they are unable to make 1/4NS for adult patients only NICU. [employer] notified, no orders received but suggested speaking with CCM to see if they have any recommendations since they are already managing the patient's sodium.

## 2021-10-14 NOTE — Progress Notes (Signed)
Daily Progress Note   Patient Name: [name]       Date: 10/14/2021 DOB: 1975-03-07  Age: 46 y.o. MRN#: [id number] Attending Physician: Ashok Pall, MD Primary Care Physician: Glendon Axe, MD Admit Date: 10/01/2021 Length of Stay: 12 days  Reason for Consultation/Follow-up: Establishing goals of care  HPI/Patient Profile:  46 y.o. female  with past medical history of hypertension who presented to the ED after being found unresponsive by her husband. The patient was last seen at her baseline last night and this morning, around 6am, the patient's husband noted that she was "breathing funny" and making gurgling noises. He attempted to wake her and she was unresponsive and had his daughter call EMS. He started CPR and upon EMS arrival, the patient was in SVT and they performed DCCV. HR improved, although she had no improvement in her mental status.    GCS 4 on arrival to ED, proceeded with emergent intubation. Large SAH was noted on CTH. Neurosurg and PCCM consulted. She was admitted on 10/01/2021 with catastrophic subarachnoid hemorrhage, acute hypoxemic respiratory failure, concern for aspiration pneumonia, AKI, central diabetes insipidus, hypernatremia, and others.    PCCM ongoing discussions with the family related to goals of care.  They requested transfer for second opinion but have been declined by [facility], [place], and [place].  Family is not in agreement for brain death testing.   PMT was consulted for goals of care conversations.  Subjective:   Subjective: Chart Reviewed. Updates received. Patient Assessed. Created space and opportunity for patient  and family to explore thoughts and feelings regarding current medical situation.  Today's Discussion: Today I saw the patient at bedside but she is not able to meaningfully communicate.  Her brother was also at bedside.  We discussed how he is doing, any new information that he has heard of.  He states he has not heard anything new at this  point.  He states he is doing okay.  Continued prayers for recovery.  He states that his family was there earlier but is since left.  He feels that they would likely have more questions and things to talk about that he would.  I continue to voice our support of him and his family as they are dealing with his mom's current clinical situation.  He expressed appreciation for this.  I provided emotional and general support through therapeutic listening, empathy, sharing of stories, and other techniques. I answered all questions and addressed all concerns to the best of my ability.  Review of Systems  Unable to perform ROS: Intubated    Objective:   Vital Signs:  BP (!) 111/57   Pulse 77   Temp 98.6 F (37 C)   Resp (!) 24   Ht '5\' 2"'$  (1.575 m)   Wt 66 kg   SpO2 97%   BMI 26.61 kg/m   Physical Exam: Physical Exam Vitals and nursing note reviewed.  Constitutional:      General: She is not in acute distress.    Appearance: She is ill-appearing.  HENT:     Head: Normocephalic and atraumatic.  Cardiovascular:     Rate and Rhythm: Normal rate.  Pulmonary:     Effort: Pulmonary effort is normal. No respiratory distress.  Abdominal:     General: Abdomen is flat. Bowel sounds are normal.     Palpations: Abdomen is soft.  Skin:    General: Skin is dry.     Comments: Bear Hugger in place  Neurological:  Mental Status: She is unresponsive.     Palliative Assessment/Data: 10%   Assessment & Plan:   Impression: Present on Admission:  Subarachnoid hematoma ([place])  46 year old female with catastrophic subarachnoid hemorrhage and apparent impending herniation.  She is unresponsive at this time.  Neurosurgery is following.  Family seems to be grieving substantially and having difficulty excepting the patient's current condition.  They have requested transfer for second opinion but all tertiary care centers in the area have declined to accept her.  Family is currently against brain  death testing.  Per nursing there is an Art therapist with family scheduled for Thursday at noon. Given the degree of her injury and ramifications subsequent to, overall prognosis is grave.  SUMMARY OF RECOMMENDATIONS   Continue to attempt contact with various family members Continue support of patient and family Continue attempts to establish a relationship to help with grieving and support PMT will continue to follow  Symptom Management:  Per primary team PMT is available to assist as needed  Code Status: Full code  Prognosis: Unable to determine  Discharge Planning: To Be Determined  Discussed with: Patient's family, medical team, nursing team  Thank you for allowing [place] to participate in the care of NOMA QUIJAS PMT will continue to support holistically.  Billing based on MDM: High  Problems Addressed: One acute or chronic illness or injury that poses a threat to life or bodily function  Amount and/or Complexity of Data: Category 3:Discussion of management or test interpretation with external physician/other qualified health care professional/appropriate source (not separately reported)  Risks: N/A   Walden Field, NP Palliative Medicine Team  Team Phone # [phone] (Nights/Weekends)  09/24/2020, 8:17 AM

## 2021-10-14 NOTE — Progress Notes (Signed)
NAME:  [name], MRN:  [id number], DOB:  1975-01-27, LOS: 56 ADMISSION DATE:  10/10/2021, CONSULTATION DATE: 10/15/2021 REFERRING MD: ED physician, CHIEF COMPLAINT: Subarachnoid hemorrhage  History of Present Illness:  [name] is a 46 yo female with hypertension who presented to the ED after being found unresponsive by her husband. The patient was last seen at her baseline last night and this morning, around 6am, the patient's husband noted that she was "breathing funny" and making gurgling noises. He attempted to wake her and she was unresponsive and had his daughter call EMS. He started CPR and upon EMS arrival, the patient was in SVT and they performed DCCV. HR improved, although she had no improvement in her mental status.    GCS 4 on arrival to ED, proceeded with emergent intubation. Large SAH was noted on CTH. Neurosurg and PCCM consulted.    No family history of aneurysms known.   Pertinent  Medical History  Hypertension, uterine fibroids Legally blind in left eye  Significant Hospital Events: Including procedures, antibiotic start and stop dates in addition to other pertinent events   9/7-presented unresponsive, hypotensive 9/7-CT head with subarachnoid hemorrhage 9/11- DDAVP give for Central DI, repeat head CT with worsening infarcts and herniation 9/12- Attempted transfer for second opinion at [facility] and [facility].  No beds at [facility] and transfer was declined at [facility] 9/13-no change in mental status.  Transfer to [facility] declined 9/14- [facility] declined transfer again.  No change.  DDAVP x 1  Interim History / Subjective:  Serum sodium is trending down now its 158 No new changes   Objective   Blood pressure (!) 104/59, pulse 79, temperature 98.6 F (37 C), resp. rate (!) 24, height '5\' 2"'$  (1.575 m), weight 66 kg, SpO2 99 %.    Vent Mode: PRVC FiO2 (%):  [30 %] 30 % Set Rate:  [24 bmp] 24 bmp Vt Set:  [400 mL] 400 mL PEEP:  [5 cmH20] 5 cmH20 Plateau Pressure:  [16  cmH20-17 cmH20] 16 cmH20   Intake/Output Summary (Last 24 hours) at 10/14/2021 1323 Last data filed at 10/14/2021 1200 Gross per 24 hour  Intake 2963.57 ml  Output 1535 ml  Net 1428.57 ml   Filed Weights   10/06/2021 0722 10/22/2021 1700 10/06/21 0500  Weight: 56.7 kg 66 kg 66 kg    Examination: Physical exam: General: Crtitically ill-appearing female, orally intubated HEENT: [place]/AT, eyes anicteric.  ETT and OGT in place Neuro: Eyes closed, does not open, not following commands.  Absent pupillary, corneal, cough, gag reflexes.  Triple flexion in bilateral lower extremity to painful stimuli Chest: Coarse breath sounds, no wheezes or rhonchi Heart: Regular rate and rhythm, no murmurs or gallops Abdomen: Soft, nontender, nondistended, bowel sounds present Skin: No rash   Labs and imaging reviewed Sodium 158  Resolved Hospital Problem list    Assessment & Plan:   Catastrophic subarachnoid hemorrhage H/H5, MF 4 No change in neurological exam, she has absent brainstem reflexes  Ongoing goals of care discussion with family Palliative care is following [place] prognosis Remains on Keppra for seizure prophylaxis Patient serum sodium is still elevated, cannot perform brain death testing for now  Acute hypoxemic respiratory failure Concern for aspiration pneumonia Continue full mechanical vent support Completed treatment with Unasyn Remain afebrile  Acute kidney injury Central diabetes insipidus Hypernatremia Likely in the setting of severe dehydration caused by central diabetes insipidus Serum creatinine is slowly improving We will give her DDAVP again today Continue  D5 and free water, trying to slowly correct serum sodium Repeat BMP  Protein calorie malnutrition Continue tube feeds  Bullous Rash, resolved   Best Practice (right click and "Reselect all SmartList Selections" daily)   Diet/type: tubefeeds DVT prophylaxis: SCD GI prophylaxis: PPI Lines: Central line- RUE  PICC Foley:  N/A Code Status:  full code Last date of multidisciplinary goals of care discussion [discussed with family at bedside]> ongoing  Total critical care time: 32 minutes  Performed by: [place] care time was exclusive of separately billable procedures and treating other patients.   Critical care was necessary to treat or prevent imminent or life-threatening deterioration.   Critical care was time spent personally by me on the following activities: development of treatment plan with patient and/or surrogate as well as nursing, discussions with consultants, evaluation of patient's response to treatment, examination of patient, obtaining history from patient or surrogate, ordering and performing treatments and interventions, ordering and review of laboratory studies, ordering and review of radiographic studies, pulse oximetry and re-evaluation of patient's condition.   Jacky Kindle, MD [place] [facility] See Amion for pager If no response to pager, please call [phone] until 7pm After 7pm, Please call E-link [phone]

## 2021-10-14 NOTE — Progress Notes (Signed)
2200 Na sent to lab, after no result I spoke with lab. They confirmed a light green top had been received however there was not an order on their end for a 2200 Na despite the Q4H Na order. 0200 labs sent.

## 2021-10-14 NOTE — Progress Notes (Signed)
Patient ID: [name], female   DOB: September 27, 1975, 47 y.o.   MRN: [id number] BP (!) 96/47   Pulse 89   Temp 98.8 F (37.1 C)   Resp (!) 24   Ht '5\' 2"'$  (1.575 m)   Wt 66 kg   SpO2 96%   BMI 26.61 kg/m  Comatose  Flexion of the knees with painful peripheral stimulation No corneals No oculocephalics No pupillary response to light Pupils dilated and fixed No change in her examination, prognosis grave Will change iv fluids given sodium concentration. Will replace urine output with 1/4NS 1/2 cc per cc Meeting scheduled for Thursday.

## 2021-10-15 DIAGNOSIS — Z7189 Other specified counseling: Secondary | ICD-10-CM | POA: Diagnosis not present

## 2021-10-15 DIAGNOSIS — Z515 Encounter for palliative care: Secondary | ICD-10-CM | POA: Diagnosis not present

## 2021-10-15 DIAGNOSIS — I609 Nontraumatic subarachnoid hemorrhage, unspecified: Secondary | ICD-10-CM | POA: Diagnosis not present

## 2021-10-15 DIAGNOSIS — R4182 Altered mental status, unspecified: Secondary | ICD-10-CM | POA: Diagnosis not present

## 2021-10-15 DIAGNOSIS — J9601 Acute respiratory failure with hypoxia: Secondary | ICD-10-CM | POA: Diagnosis not present

## 2021-10-15 DIAGNOSIS — G935 Compression of brain: Secondary | ICD-10-CM | POA: Diagnosis not present

## 2021-10-15 LAB — BASIC METABOLIC PANEL
Anion gap: 3 — ABNORMAL LOW (ref 5–15)
BUN: 32 mg/dL — ABNORMAL HIGH (ref 6–20)
CO2: 25 mmol/L (ref 22–32)
Calcium: 8 mg/dL — ABNORMAL LOW (ref 8.9–10.3)
Chloride: 122 mmol/L — ABNORMAL HIGH (ref 98–111)
Creatinine, Ser: 0.95 mg/dL (ref 0.44–1.00)
GFR, Estimated: >60 mL/min (ref >60)
Glucose, Bld: 168 mg/dL — ABNORMAL HIGH (ref 70–99)
Potassium: 3.7 mmol/L (ref 3.5–5.1)
Sodium: 150 mmol/L — ABNORMAL HIGH (ref 135–145)

## 2021-10-15 LAB — GLUCOSE, CAPILLARY
Glucose-Capillary: 116 mg/dL — ABNORMAL HIGH (ref 70–99)
Glucose-Capillary: 118 mg/dL — ABNORMAL HIGH (ref 70–99)
Glucose-Capillary: 120 mg/dL — ABNORMAL HIGH (ref 70–99)
Glucose-Capillary: 122 mg/dL — ABNORMAL HIGH (ref 70–99)
Glucose-Capillary: 125 mg/dL — ABNORMAL HIGH (ref 70–99)
Glucose-Capillary: 125 mg/dL — ABNORMAL HIGH (ref 70–99)
Glucose-Capillary: 128 mg/dL — ABNORMAL HIGH (ref 70–99)
Glucose-Capillary: 134 mg/dL — ABNORMAL HIGH (ref 70–99)
Glucose-Capillary: 138 mg/dL — ABNORMAL HIGH (ref 70–99)
Glucose-Capillary: 145 mg/dL — ABNORMAL HIGH (ref 70–99)
Glucose-Capillary: 147 mg/dL — ABNORMAL HIGH (ref 70–99)
Glucose-Capillary: 147 mg/dL — ABNORMAL HIGH (ref 70–99)
Glucose-Capillary: 159 mg/dL — ABNORMAL HIGH (ref 70–99)
Glucose-Capillary: 99 mg/dL (ref 70–99)

## 2021-10-15 LAB — SODIUM: Sodium: 150 mmol/L — ABNORMAL HIGH (ref 135–145)

## 2021-10-15 MED ORDER — POTASSIUM CHLORIDE 20 MEQ PO PACK
40.0000 meq | PACK | Freq: Once | ORAL | Status: AC
  Administered 2021-10-15: 40 meq
  Filled 2021-10-15: qty 2

## 2021-10-15 MED ORDER — INSULIN ASPART 100 UNIT/ML IJ SOLN
0.0000 [IU] | INTRAMUSCULAR | Status: DC
  Administered 2021-10-15 – 2021-10-16 (×6): 2 [IU] via SUBCUTANEOUS
  Administered 2021-10-17: 3 [IU] via SUBCUTANEOUS
  Administered 2021-10-17: 2 [IU] via SUBCUTANEOUS
  Administered 2021-10-17: 3 [IU] via SUBCUTANEOUS
  Administered 2021-10-17: 2 [IU] via SUBCUTANEOUS
  Administered 2021-10-18: 8 [IU] via SUBCUTANEOUS
  Administered 2021-10-18 (×2): 5 [IU] via SUBCUTANEOUS
  Administered 2021-10-18: 8 [IU] via SUBCUTANEOUS
  Administered 2021-10-18 (×2): 3 [IU] via SUBCUTANEOUS
  Administered 2021-10-19: 8 [IU] via SUBCUTANEOUS
  Administered 2021-10-19: 11 [IU] via SUBCUTANEOUS
  Administered 2021-10-19: 5 [IU] via SUBCUTANEOUS

## 2021-10-15 MED ORDER — FREE WATER
200.0000 mL | Status: DC
  Administered 2021-10-15 – 2021-10-17 (×11): 200 mL

## 2021-10-15 MED ORDER — NOREPINEPHRINE 4 MG/250ML-% IV SOLN
INTRAVENOUS | Status: AC
  Filled 2021-10-15: qty 250

## 2021-10-15 MED ORDER — NOREPINEPHRINE 4 MG/250ML-% IV SOLN
0.0000 ug/min | INTRAVENOUS | Status: DC
  Administered 2021-10-15 – 2021-10-16 (×2): 2 ug/min via INTRAVENOUS
  Administered 2021-10-17: 6 ug/min via INTRAVENOUS
  Administered 2021-10-17: 10 ug/min via INTRAVENOUS
  Administered 2021-10-17: 8 ug/min via INTRAVENOUS
  Administered 2021-10-18: 14 ug/min via INTRAVENOUS
  Administered 2021-10-18: 12 ug/min via INTRAVENOUS
  Administered 2021-10-18: 18 ug/min via INTRAVENOUS
  Administered 2021-10-18: 14 ug/min via INTRAVENOUS
  Filled 2021-10-15 (×8): qty 250

## 2021-10-15 MED ORDER — DESMOPRESSIN ACETATE 4 MCG/ML IJ SOLN
4.0000 ug | Freq: Once | INTRAMUSCULAR | Status: AC
  Administered 2021-10-15: 4 ug via INTRAVENOUS
  Filled 2021-10-15: qty 1

## 2021-10-15 NOTE — Progress Notes (Signed)
Patient ID: [name], female   DOB: May 30, 1975, 46 y.o.   MRN: [id number] Comatose Pupils not reactive to light, fixed and dilated No oculocephalics No corneals No cough, gag No change in exam. No respiratory drive , two trials of weaning today without the ventilator being triggered. Ethics meeting scheduled for tomorrow

## 2021-10-15 NOTE — Progress Notes (Signed)
Daily Progress Note   Patient Name: [name]       Date: 10/15/2021 DOB: 1975/12/24  Age: 46 y.o. MRN#: [id number] Attending Physician: Ashok Pall, MD Primary Care Physician: Glendon Axe, MD Admit Date: 10/25/2021 Length of Stay: 13 days  Reason for Consultation/Follow-up: Establishing goals of care  HPI/Patient Profile:  46 y.o. female  with past medical history of hypertension who presented to the ED after being found unresponsive by her husband. The patient was last seen at her baseline last night and this morning, around 6am, the patient's husband noted that she was "breathing funny" and making gurgling noises. He attempted to wake her and she was unresponsive and had his daughter call EMS. He started CPR and upon EMS arrival, the patient was in SVT and they performed DCCV. HR improved, although she had no improvement in her mental status.    GCS 4 on arrival to ED, proceeded with emergent intubation. Large SAH was noted on CTH. Neurosurg and PCCM consulted. She was admitted on 09/29/2021 with catastrophic subarachnoid hemorrhage, acute hypoxemic respiratory failure, concern for aspiration pneumonia, AKI, central diabetes insipidus, hypernatremia, and others.    PCCM ongoing discussions with the family related to goals of care.  They requested transfer for second opinion but have been declined by [facility], [place], and [place].  Family is not in agreement for brain death testing.   PMT was consulted for goals of care conversations.  Subjective:   Subjective: Chart Reviewed. Updates received. Patient Assessed. Created space and opportunity for patient  and family to explore thoughts and feelings regarding current medical situation.  Today's Discussion: Today I saw the patient at bedside but she is not able to meaningfully communicate.  Her brother was also at bedside.  He noted his sister and grandfather would be by later on. I agreed to check back around 1:00.   I followed up around  1:00 and met with the patient's father [name], son [name], and daughter [name].  I reintroduced palliative medicine to the patient's father and daughter.  I explained our role at this point is support for the patient and family as they are dealing with a complicated medical situation.    We discussed how she is doing, any new information that he has heard of.  We noted that the patient is back on pressors, although a low dose.  We discussed the plan for some sort of breathing trial today.  The nurse further clarified a short-term apnea trial which was completed and did not show that she was initiating respiratory effort.  We noted continued prayers for recovery.   We discussed the plan for a meeting with ethics tomorrow at noon.  They understand this is related to brain death testing.  Family at this point is against brain death testing.  They expressed multiple opinions including the progress that she is being admitted.  They are wanting more time for improvement.  They noted that they do not trust the medical opinion at this point because on the first night they "tried to write her off and then when she survive the first night they try to write her off the second night and then when she friend survived the second night they try to write her off for a night, etc., etc."  The patient's daughter essence specifically seems frustrated with the current situation.  She is adamant that they not do brain death testing.  Nursing was present and explained what is involved with  brain death testing and that it is only to gather information.  However, the patient's daughter stated that if she is found to be brain dead did not would result in changes in her care so to be "transparent" brain death testing that does have an impact on her care.  I expressed that I understand her side of the story and her opinion.  I encouraged her during the ethics meeting to express opinions such as these.  The point of  ethics is to discuss ethical dilemma as an care and very complicated situations that have a lot of gray area where there are not clear black-and-white decisions.  Family feels that ethics may have already made up their mind and they are just going to tell them what they are going to do.  I again encouraged them to express their opinions and believes.  The patient's daughter stated that if they do brain death testing and she is found to be brain dead they will try to "run her off" and if they do that then in her mind that is murder.  She brought up the [facility] [place] patient and family guidebook that noted they have the right to have their religious believes expressed and that their religious believes are to not give up on sick patients on and allowing them to die.  I explained that it is not always a simple solution and that those areas are gray.  They seem frustrated by all of this.  I ended the discussion with offering for palliative care to be in attendance during the ethics meeting and they have asked that this occur.  I again expressed my sympathies and that I would continue to hope and pray for the patient and family.  They expressed appreciation.  I informed him again that the role of palliative at this point is to provide family support as everything plays out.  During our meeting we also took time to get into the patient.  She is married for 2 years.  She currently works at [employer].  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  However, they expressed that she is independent other than driving.  In fact, they feel the term "fiercely independent" best describes her.  They state that she does not let her visual impairment hold her back.  Things that bring her happiness are family, working in the garden, and driving when able.  They describe her as "the glue that keeps all of this together."  I provided emotional and general support through therapeutic listening,  empathy, sharing of stories, and other techniques. I answered all questions and addressed all concerns to the best of my ability.  Review of Systems  Unable to perform ROS: Intubated    Objective:   Vital Signs:  BP 115/69   Pulse 80   Temp (!) 97.2 F (36.2 C)   Resp (!) 24   Ht 5' 2"  (1.575 m)   Wt 79.2 kg   SpO2 100%   BMI 31.94 kg/m   Physical Exam: Physical Exam Vitals and nursing note reviewed.  Constitutional:      General: She is not in acute distress.    Appearance: She is ill-appearing.  HENT:     Head: Normocephalic and atraumatic.  Cardiovascular:     Rate and Rhythm: Normal rate.  Pulmonary:     Effort: Pulmonary effort is normal. No respiratory distress.  Abdominal:     General: Abdomen is flat. Bowel  sounds are normal.     Palpations: Abdomen is soft.  Skin:    General: Skin is dry.     Comments: Bear Hugger in place  Neurological:     Mental Status: She is unresponsive.     Palliative Assessment/Data: 10%   Assessment & Plan:   Impression: Present on Admission:  Subarachnoid hematoma ([place])  46 year old female with catastrophic subarachnoid hemorrhage and apparent impending herniation.  She is unresponsive at this time.  Neurosurgery is following.  Family seems to be grieving substantially and having difficulty excepting the patient's current condition.  They have requested transfer for second opinion but all tertiary care centers in the area have declined to accept her.  Family is currently against brain death testing.  There is an Art therapist with family scheduled for tomorrow at noon. Family has asked that Palliative be in attendance. Given the degree of her injury and ramifications subsequent to, overall prognosis is grave.  SUMMARY OF RECOMMENDATIONS   Continue to attempt contact with various family members Continue support of patient and family Continue attempts to establish a relationship to help with grieving and support PMT will  attempt to be in attendance at the Ethics Meeting tomorrow PMT will continue to follow  Symptom Management:  Per primary team PMT is available to assist as needed  Code Status: Full code  Prognosis: Unable to determine  Discharge Planning: To Be Determined  Discussed with: Patient's family, medical team, nursing team  Thank you for allowing [place] to participate in the care of FALYN RUBEL PMT will continue to support holistically.  Billing based on MDM: High  Problems Addressed: One acute or chronic illness or injury that poses a threat to life or bodily function  Amount and/or Complexity of Data: Category 3:Discussion of management or test interpretation with external physician/other qualified health care professional/appropriate source (not separately reported)  Risks: N/A   Walden Field, NP Palliative Medicine Team  Team Phone # [phone] (Nights/Weekends)  09/24/2020, 8:17 AM

## 2021-10-15 NOTE — Progress Notes (Signed)
0800 CBG was 125. Endotool recommended stopping insulin gtt. Dr Tacy Learn notified. Verbal order to stop insulin gtt and D10 infusion. Will be switching to sliding scale insulin.  Montez Hageman RN

## 2021-10-15 NOTE — Progress Notes (Addendum)
RT with Dr. Christella Noa at the bedside to attempt a vent SBT wean.    On PS 5 /CPAP 5 no patient effort. Vent triggered back up mode after 20 seconds.  On PS 20 / CPAP 5 no pt effort. Vent triggered back up mode after 20 seconds.  Patient placed back on full vent support.

## 2021-10-15 NOTE — Progress Notes (Signed)
NAME:  [name], MRN:  [id number], DOB:  October 30, 1975, LOS: 43 ADMISSION DATE:  10/16/2021, CONSULTATION DATE: 10/22/2021 REFERRING MD: ED physician, CHIEF COMPLAINT: Subarachnoid hemorrhage  History of Present Illness:  [name] is a 46 yo female with hypertension who presented to the ED after being found unresponsive by her husband. The patient was last seen at her baseline last night and this morning, around 6am, the patient's husband noted that she was "breathing funny" and making gurgling noises. He attempted to wake her and she was unresponsive and had his daughter call EMS. He started CPR and upon EMS arrival, the patient was in SVT and they performed DCCV. HR improved, although she had no improvement in her mental status.    GCS 4 on arrival to ED, proceeded with emergent intubation. Large SAH was noted on CTH. Neurosurg and PCCM consulted.    No family history of aneurysms known.   Pertinent  Medical History  Hypertension, uterine fibroids Legally blind in left eye  Significant Hospital Events: Including procedures, antibiotic start and stop dates in addition to other pertinent events   9/7-presented unresponsive, hypotensive 9/7-CT head with subarachnoid hemorrhage 9/11- DDAVP give for Central DI, repeat head CT with worsening infarcts and herniation 9/12- Attempted transfer for second opinion at [facility] and [facility].  No beds at [facility] and transfer was declined at [facility] 9/13-no change in mental status.  Transfer to [facility] declined 9/14- [facility] declined transfer again.  No change.  DDAVP x 1  Interim History / Subjective:  Serum sodium is trending down now its 150 Patient became hypotensive this morning, requiring low-dose Levophed   Objective   Blood pressure 137/80, [name] 78, temperature (!) 97.2 F (36.2 C), resp. rate (!) 24, height '5\' 2"'$  (1.575 m), weight 79.2 kg, SpO2 100 %.    Vent Mode: PRVC FiO2 (%):  [30 %] 30 % Set Rate:  [24 bmp] 24 bmp Vt Set:  [400  mL] 400 mL PEEP:  [5 cmH20] 5 cmH20 Plateau Pressure:  [16 cmH20-18 cmH20] 18 cmH20   Intake/Output Summary (Last 24 hours) at 10/15/2021 9924 Last data filed at 10/15/2021 0800 Gross per 24 hour  Intake 4405 ml  Output 2200 ml  Net 2205 ml   Filed Weights   10/10/2021 1700 10/06/21 0500 10/15/21 0500  Weight: 66 kg 66 kg 79.2 kg    Examination: Physical exam: General: Crtitically ill-appearing female, orally intubated HEENT: [place]/AT, eyes anicteric.  ETT and OGT in place Neuro: Eyes closed, does not open, not following commands.  Absent pupillary, corneal, cough, gag reflexes.  Triple flexion in bilateral lower extremity to painful stimuli Chest: Coarse breath sounds, no wheezes or rhonchi Heart: Regular rate and rhythm, no murmurs or gallops Abdomen: Soft, nontender, nondistended, bowel sounds present Skin: No rash   Labs and imaging reviewed Sodium 150  Resolved Hospital Problem list    Assessment & Plan:   Catastrophic subarachnoid hemorrhage H/H5, MF 4 No change in neurological exam, she has absent brainstem reflexes  Ongoing goals of care discussion with family Ethics committee meeting is scheduled for tomorrow Palliative care is following [place] prognosis Remains on Keppra for seizure prophylaxis  Acute hypoxemic respiratory failure Concern for aspiration pneumonia Continue full mechanical vent support Completed treatment with Unasyn Remain afebrile with stable white count  Acute kidney injury Central diabetes insipidus Hypernatremia Likely in the setting of severe dehydration caused by central diabetes insipidus Serum creatinine is improving after treatment of central DI We  will give her DDAVP again today Stop D5 Decrease free water to 200 every 2 hours Repeat BMP in the morning Serum sodium trended down to 150  Protein calorie malnutrition Continue tube feeds  Bullous Rash, resolved   Best Practice (right click and "Reselect all SmartList Selections"  daily)   Diet/type: tubefeeds DVT prophylaxis: SCD GI prophylaxis: PPI Lines: Central line- RUE PICC Foley:  N/A Code Status:  full code Last date of multidisciplinary goals of care discussion [discussed with family at bedside]> ongoing  Total critical care time: 33 minutes  Performed by: [place] care time was exclusive of separately billable procedures and treating other patients.   Critical care was necessary to treat or prevent imminent or life-threatening deterioration.   Critical care was time spent personally by me on the following activities: development of treatment plan with patient and/or surrogate as well as nursing, discussions with consultants, evaluation of patient's response to treatment, examination of patient, obtaining history from patient or surrogate, ordering and performing treatments and interventions, ordering and review of laboratory studies, ordering and review of radiographic studies, [name] oximetry and re-evaluation of patient's condition.   Jacky Kindle, MD [place] [facility] See Amion for pager If no response to pager, please call [phone] until 7pm After 7pm, Please call E-link [phone]

## 2021-10-15 NOTE — Progress Notes (Signed)
Dr. Christella Noa present on the unit, if sodium increases he wants an order to be placed for urine replacement. Replace 1/2 of the hourly UOP with 1/2NS, and leave D10 running.

## 2021-10-15 NOTE — Progress Notes (Signed)
Patient's BP 83/49 with a MAP of 60. Dr Tacy Learn notified. Verbal order to restart levophed gtt.   Montez Hageman RN

## 2021-10-16 DIAGNOSIS — E870 Hyperosmolality and hypernatremia: Secondary | ICD-10-CM | POA: Diagnosis not present

## 2021-10-16 DIAGNOSIS — N179 Acute kidney failure, unspecified: Secondary | ICD-10-CM

## 2021-10-16 DIAGNOSIS — J9601 Acute respiratory failure with hypoxia: Secondary | ICD-10-CM | POA: Diagnosis not present

## 2021-10-16 DIAGNOSIS — Z515 Encounter for palliative care: Secondary | ICD-10-CM | POA: Diagnosis not present

## 2021-10-16 DIAGNOSIS — I609 Nontraumatic subarachnoid hemorrhage, unspecified: Secondary | ICD-10-CM | POA: Diagnosis not present

## 2021-10-16 LAB — GLUCOSE, CAPILLARY
Glucose-Capillary: 114 mg/dL — ABNORMAL HIGH (ref 70–99)
Glucose-Capillary: 127 mg/dL — ABNORMAL HIGH (ref 70–99)
Glucose-Capillary: 119 mg/dL — ABNORMAL HIGH (ref 70–99)
Glucose-Capillary: 133 mg/dL — ABNORMAL HIGH (ref 70–99)
Glucose-Capillary: 127 mg/dL — ABNORMAL HIGH (ref 70–99)
Glucose-Capillary: 147 mg/dL — ABNORMAL HIGH (ref 70–99)

## 2021-10-16 LAB — BASIC METABOLIC PANEL
Anion gap: 8 (ref 5–15)
BUN: 27 mg/dL — ABNORMAL HIGH (ref 6–20)
CO2: 23 mmol/L (ref 22–32)
Calcium: 8.9 mg/dL (ref 8.9–10.3)
Chloride: 110 mmol/L (ref 98–111)
Creatinine, Ser: 1.04 mg/dL — ABNORMAL HIGH (ref 0.44–1.00)
GFR, Estimated: >60 mL/min (ref >60)
Glucose, Bld: 192 mg/dL — ABNORMAL HIGH (ref 70–99)
Potassium: 4.6 mmol/L (ref 3.5–5.1)
Sodium: 141 mmol/L (ref 135–145)

## 2021-10-16 NOTE — Progress Notes (Signed)
NAME:  [name], MRN:  [id number], DOB:  08-16-1975, LOS: 46 ADMISSION DATE:  09/28/2021, CONSULTATION DATE: 10/24/2021 REFERRING MD: ED physician, CHIEF COMPLAINT: Subarachnoid hemorrhage  History of Present Illness:  [name] is a 46 yo female with hypertension who presented to the ED after being found unresponsive by her husband. The patient was last seen at her baseline last night and this morning, around 6am, the patient's husband noted that she was "breathing funny" and making gurgling noises. He attempted to wake her and she was unresponsive and had his daughter call EMS. He started CPR and upon EMS arrival, the patient was in SVT and they performed DCCV. HR improved, although she had no improvement in her mental status.    GCS 4 on arrival to ED, proceeded with emergent intubation. Large SAH was noted on CTH. Neurosurg and PCCM consulted.    No family history of aneurysms known.   Pertinent  Medical History  Hypertension, uterine fibroids Legally blind in left eye  Significant Hospital Events: Including procedures, antibiotic start and stop dates in addition to other pertinent events   9/7-presented unresponsive, hypotensive 9/7-CT head with subarachnoid hemorrhage 9/11- DDAVP give for Central DI, repeat head CT with worsening infarcts and herniation 9/12- Attempted transfer for second opinion at [facility] and [facility].  No beds at [facility] and transfer was declined at [facility] 9/13-no change in mental status.  Transfer to [facility] declined 9/14- [facility] declined transfer again.  No change.  DDAVP x 1  Interim History / Subjective:  Serum sodium is trending down now its 140 Continue to require low-dose vasopressors   Objective   Blood pressure 93/63, pulse 77, temperature 97.7 F (36.5 C), resp. rate (!) 24, height '5\' 2"'$  (1.575 m), weight 77.6 kg, SpO2 100 %.    Vent Mode: PRVC FiO2 (%):  [30 %] 30 % Set Rate:  [24 bmp] 24 bmp Vt Set:  [400 mL] 400 mL PEEP:  [5 cmH20] 5  cmH20 Plateau Pressure:  [16 cmH20-18 cmH20] 18 cmH20   Intake/Output Summary (Last 24 hours) at 10/16/2021 0944 Last data filed at 10/16/2021 0900 Gross per 24 hour  Intake 1380.11 ml  Output 2125 ml  Net -744.89 ml   Filed Weights   10/06/21 0500 10/15/21 0500 10/16/21 0500  Weight: 66 kg 79.2 kg 77.6 kg    Examination: Physical exam: General: Crtitically ill-appearing female, orally intubated HEENT: [place]/AT, eyes anicteric.  ETT and OGT in place Neuro: Eyes closed, does not open, not following commands.  Absent pupillary, corneal, cough, gag reflexes.  Extensor posturing in bilateral upper extremities, flexor response to bilateral lower extremities Chest: Coarse breath sounds, no wheezes or rhonchi Heart: Regular rate and rhythm, no murmurs or gallops Abdomen: Soft, nontender, nondistended, bowel sounds present Skin: Bullous rash on bilateral upper extremities   Labs and imaging reviewed  Resolved Hospital Problem list    Assessment & Plan:   Catastrophic subarachnoid hemorrhage H/H5, MF 4 No change in neurological exam, she has absent brainstem reflexes  Patient does not meet criteria for brain death examination considering she has extensor posturing in upper extremities and legs are posturing in lower extremities Ongoing goals of care discussion with family Ethics committee meeting is scheduled for tomorrow Palliative care is following [facility] prognosis Remains on Keppra for seizure prophylaxis  Acute hypoxemic respiratory failure Concern for aspiration pneumonia Continue full mechanical vent support Completed treatment with Unasyn Remain afebrile with stable white count  Acute kidney injury Central  diabetes insipidus Hypernatremia Likely in the setting of severe dehydration caused by central diabetes insipidus Serum creatinine has been proved after treatment of central DI Serum sodium has corrected to 140 We will hold off on DDAVP Stop IV fluid Continue free  water flushes  Protein calorie malnutrition Continue tube feeds  Bullous Rash Continue wound care  Best Practice (right click and "Reselect all SmartList Selections" daily)   Diet/type: tubefeeds DVT prophylaxis: SCD GI prophylaxis: PPI Lines: Central line- RUE PICC Foley:  N/A Code Status:  full code Last date of multidisciplinary goals of care discussion [discussed with family at bedside]> ongoing.  Ethics meeting is scheduled at 12  Total critical care time: 32 minutes  Performed by: [place] care time was exclusive of separately billable procedures and treating other patients.   Critical care was necessary to treat or prevent imminent or life-threatening deterioration.   Critical care was time spent personally by me on the following activities: development of treatment plan with patient and/or surrogate as well as nursing, discussions with consultants, evaluation of patient's response to treatment, examination of patient, obtaining history from patient or surrogate, ordering and performing treatments and interventions, ordering and review of laboratory studies, ordering and review of radiographic studies, pulse oximetry and re-evaluation of patient's condition.   Jacky Kindle, MD [place] [facility] See Amion for pager If no response to pager, please call [phone] until 7pm After 7pm, Please call E-link [phone]

## 2021-10-16 NOTE — Progress Notes (Signed)
Patient ID: [name], female   DOB: Jun 04, 1975, 46 y.o.   MRN: [id number] BP (!) 89/62   Pulse 91   Temp 100.2 F (37.9 C)   Resp (!) 24   Ht '5\' 2"'$  (1.575 m)   Wt 77.6 kg   SpO2 98%   BMI 31.29 kg/m  Comatose No response to noxious stimuli above the foramen magnum No corneals No oculocephalic reflex No cough No gag No response to cold caloric testing [name] is not triggering the ventilator.  Prognosis remains grave.  Continue current treatment and support. Observed movements are not an indication the brainstem is functioning.

## 2021-10-16 NOTE — Progress Notes (Signed)
Patient ID: [name], female   DOB: February 08, 1975, 46 y.o.   MRN: [id number]    Progress Note from the Palliative Medicine Team at [facility]   Patient Name: [name]        Date: 10/16/2021 DOB: 1975-03-24  Age: 46 y.o. MRN#: [id number] Attending Physician: Ashok Pall, MD Primary Care Physician: Glendon Axe, MD Admit Date: 10/01/2021   Medical records reviewed   46 year old female with hypertension who presented to the emergency room after being found unresponsive by her husband.  Patient was last seen at her baseline around 6 AM, patient's husband noticed that she was "breathing funny" and making gurgling noises.  He attempted to wake her and she was unresponsive, 47 year old daughter called EMS.  He started CPR and upon EMS arrival the patient was in SVT and they performed DCCV.  Heart rate improved.  There was not improvement in her mental status.   GSA 4 on arrival to the emergency room, proceed with emergent intubation.  Large SAH on CT of the head.  Neurosurgery and PCCM consulted.  Significant Hospital events: 9/7-presented unresponsive, hypotensive 9/7-CT head with subarachnoid hemorrhage 9/11- DDAVP give for Central DI, repeat head CT with worsening infarcts and herniation 9/12- Attempted transfer for second opinion at [facility].  No beds at [facility] and transfer was declined at [facility] 9/13-no change in mental status.  Transfer to [facility] declined 9/14- [facility] declined transfer again.  No change.    Initial  PMT consult on 10-13-21/ Walden Field NP.   Unfortunately no change in neurologic exam.    This NP assessed patient at the bedside and introduced myself to the family as a provider with the palliative medicine team.  Family requested PMT support at planned Ethics meeting for today.    Emotional support to family, they are anxious in anticipation of meeting, they tell me there has been  discussion regarding brain death testing, and they are not ready to "give  up, we want more time", to look for signs of improvement.  They express their faith in God and miracles.   Gathered at the bedside today are husband/ [name], daughter/[name], son/ [name] and patient's brother. Patient's mother [name] also participated via telephone.  12:00 Family gathered in conference room along with 2 members of the ethics committee/[name] and [name], Dr. Elon Spanner, Dr. Hulen Skains, several nursing staff members for continued conversation and education regarding current medical situation.  Dr Tacy Learn updated family; at this point brain death testing is not indicated.  Medical team did share the gravity of the situation and likely associated grim prognosis.  Medical team clearly communicated with the family patient is being supported with full medical interventions  Patient's  son lovingly shared with the team, he and his family's understanding of the seriousness of his mother's current medical situation.  However at this time family is hopeful for "more time to see how she will do", "we believe in miracles".  They share their love for [name], and their concern for her 46 yo daughter.  Next steps regarding trach and PEG presented.   Family is open to all offered and available medical interventions to prolong life.  Education offered today regarding  the importance of continued conversation with family and their  medical providers regarding overall plan of care and treatment options,  ensuring decisions are within the context of the patients values and GOCs.  PMT will continue to support holistically  Questions and concerns addressed  Discussed with Dr Leland Johns NP  Palliative Medicine Team Team Phone # [phone] Pager [phone]

## 2021-10-17 DIAGNOSIS — I609 Nontraumatic subarachnoid hemorrhage, unspecified: Secondary | ICD-10-CM | POA: Diagnosis not present

## 2021-10-17 DIAGNOSIS — J988 Other specified respiratory disorders: Secondary | ICD-10-CM

## 2021-10-17 DIAGNOSIS — E232 Diabetes insipidus: Secondary | ICD-10-CM | POA: Diagnosis not present

## 2021-10-17 LAB — GLUCOSE, CAPILLARY
Glucose-Capillary: 199 mg/dL — ABNORMAL HIGH (ref 70–99)
Glucose-Capillary: 167 mg/dL — ABNORMAL HIGH (ref 70–99)
Glucose-Capillary: 110 mg/dL — ABNORMAL HIGH (ref 70–99)
Glucose-Capillary: 147 mg/dL — ABNORMAL HIGH (ref 70–99)
Glucose-Capillary: 123 mg/dL — ABNORMAL HIGH (ref 70–99)
Glucose-Capillary: 112 mg/dL — ABNORMAL HIGH (ref 70–99)

## 2021-10-17 MED ORDER — FREE WATER
200.0000 mL | Freq: Four times a day (QID) | Status: DC
  Administered 2021-10-17 – 2021-10-18 (×4): 200 mL

## 2021-10-17 NOTE — Progress Notes (Signed)
NAME:  [name], MRN:  [id number], DOB:  May 03, 1975, LOS: 42 ADMISSION DATE:  10/07/2021, CONSULTATION DATE: 10/06/2021 REFERRING MD: ED physician, CHIEF COMPLAINT: Subarachnoid hemorrhage  History of Present Illness:  46 yo female former smoker was found unresponsive by her husband and EMS called.  She was making gurgling noises with her breathing.  She was found to have SVT and EMS performed DCCV.  She had GCS 4 in ER and intubated for airway protection.  Found to have large SAH and neurosurgery arranged for hospital admission.  PCCM consulted to assist with management in ICU.  Pertinent  Medical History  Hypertension, uterine fibroids Legally blind in left eye  Significant Hospital Events: Including procedures, antibiotic start and stop dates in addition to other pertinent events   9/07 presented unresponsive, hypotensive; CT head with subarachnoid hemorrhage 9/11 DDAVP give for Central DI, repeat head CT with worsening infarcts and herniation 9/12 Attempted transfer for second opinion at [facility] and [facility].  No beds at [facility] and transfer was declined at [facility] 9/13 no change in mental status.  Transfer to [facility] declined 9/14 [facility] declined transfer again.  No change.  DDAVP x 1 9/18 palliative care consulted  Interim History / Subjective:  Doesn't initiate respiratory effort when switched to pressure support.  Objective   Blood pressure (!) 104/56, pulse 96, temperature 99.1 F (37.3 C), resp. rate (!) 24, height '5\' 2"'$  (1.575 m), weight 69.2 kg, SpO2 98 %.    Vent Mode: PRVC FiO2 (%):  [30 %] 30 % Set Rate:  [24 bmp] 24 bmp Vt Set:  [400 mL] 400 mL PEEP:  [5 cmH20] 5 cmH20 Plateau Pressure:  [17 cmH20-20 cmH20] 17 cmH20   Intake/Output Summary (Last 24 hours) at 10/17/2021 0258 Last data filed at 10/17/2021 0600 Gross per 24 hour  Intake 1795.94 ml  Output 3550 ml  Net -1754.06 ml   Filed Weights   10/15/21 0500 10/16/21 0500 10/17/21 0500  Weight: 79.2 kg 77.6 kg  69.2 kg    Examination:  General - unresponsive Eyes - pupils fixed/dilated ENT - ETT in place Cardiac - regular rate/rhythm, no murmur Chest - equal breath sounds b/l, no wheezing or rales Abdomen - soft, non tender, + bowel sounds Extremities - no cyanosis, clubbing, or edema Skin - no rashes Neuro - no corneal reflexs  Resolved Hospital Problem list   Aspiration pneumonitis >> completed ABx 9/13, AKI from hypovolemia  Assessment & Plan:   Compromised airway in setting of SAH. - not a candidate for vent weaning due to neurologic status - goal SpO2 > 92%  H/H 5 SAH. - very poor prognosis for meaningful neurologic recovery - palliative care and ethics team consulted  Central diabetes insipidus. - Na improved - change free water to 200 ml q6h - f/u BMET  Hypotension. - pressors to keep MAP > 65  Best Practice (right click and "Reselect all SmartList Selections" daily)   Diet/type: tubefeeds DVT prophylaxis: SCD GI prophylaxis: PPI Lines: Central line- RUE PICC Foley:  N/A Code Status:  full code Last date of multidisciplinary goals of care discussion: updated pt's husband at bedside  Labs:      Latest Ref Rng & Units 10/16/2021    5:36 AM 10/15/2021    5:44 AM 10/15/2021    2:09 AM  CMP  Glucose 70 - 99 mg/dL 192   168   BUN 6 - 20 mg/dL 27   32   Creatinine 0.44 - 1.00  mg/dL 1.04   0.95   Sodium 135 - 145 mmol/L 141  150  150   Potassium 3.5 - 5.1 mmol/L 4.6   3.7   Chloride 98 - 111 mmol/L 110   122   CO2 22 - 32 mmol/L 23   25   Calcium 8.9 - 10.3 mg/dL 8.9   8.0        Latest Ref Rng & Units 10/13/2021    3:30 AM 10/11/2021    8:55 PM 10/11/2021    5:08 AM  CBC  WBC 4.0 - 10.5 K/uL 11.6  9.0  10.7   Hemoglobin 12.0 - 15.0 g/dL 8.4  9.0  9.6   Hematocrit 36.0 - 46.0 % 28.3  29.2  30.7   Platelets 150 - 400 K/uL 146  123  108     ABG    Component Value Date/Time   PHART 7.409 10/08/2021 0804   PCO2ART 34.1 10/18/2021 0804   PO2ART 158 (H)  10/01/2021 0804   HCO3 21.8 09/29/2021 0804   TCO2 23 10/01/2021 0804   ACIDBASEDEF 3.0 (H) 09/28/2021 0804   O2SAT 99 10/01/2021 0804    CBG (last 3)  Recent Labs    10/16/21 2321 10/17/21 0328 10/17/21 0818  GLUCAP 147* 199* 167*    Critical care time: 36 minutes  Chesley Mires, MD [place] Pager - [phone] 10/17/2021, 8:21 AM

## 2021-10-17 NOTE — Progress Notes (Signed)
Patient did not meet criteria for SBT this am due to patient having no gag reflex/cough. Patient on full support ventilation.

## 2021-10-17 NOTE — Plan of Care (Deleted)
  Problem: Elimination: Goal: Will not experience complications related to urinary retention Outcome: Progressing   Problem: Pain Managment: Goal: General experience of comfort will improve Outcome: Progressing   Problem: Skin Integrity: Goal: Risk for impaired skin integrity will decrease Outcome: Progressing   Problem: Respiratory: Goal: Ability to maintain a clear airway and adequate ventilation will improve Outcome: Progressing   Problem: Fluid Volume: Goal: Ability to maintain a balanced intake and output will improve Outcome: Progressing   Problem: Tissue Perfusion: Goal: Adequacy of tissue perfusion will improve Outcome: Progressing   Problem: Cardiac: Goal: Ability to maintain an adequate cardiac output will improve Outcome: Progressing   Problem: Urinary Elimination: Goal: Ability to achieve and maintain adequate renal perfusion and functioning will improve Outcome: Progressing

## 2021-10-17 NOTE — Progress Notes (Signed)
Patient ID: [name], female   DOB: Jun 30, 1975, 46 y.o.   MRN: [id number] BP 92/76   Pulse (!) 105   Temp 99.5 F (37.5 C)   Resp (!) 24   Ht '5\' 2"'$  (1.575 m)   Wt 69.2 kg   SpO2 94%   BMI 27.90 kg/m  Comatose No change in neurological exam.  Prognosis is grave At this time waiting for peg tube and tracheostomy as agreed upon during the family meeting.  No recommendations

## 2021-10-17 NOTE — Plan of Care (Signed)
  Problem: Clinical Measurements: Goal: Cardiovascular complication will be avoided Outcome: Progressing   Problem: Activity: Goal: Risk for activity intolerance will decrease Outcome: Progressing   Problem: Nutrition: Goal: Adequate nutrition will be maintained Outcome: Progressing   Problem: Elimination: Goal: Will not experience complications related to bowel motility Outcome: Progressing Goal: Will not experience complications related to urinary retention 10/17/2021 0022 by Lorenso Quarry, RN Outcome: Progressing 10/17/2021 0001 by Lorenso Quarry, RN Outcome: Progressing   Problem: Pain Managment: Goal: General experience of comfort will improve Outcome: Progressing   Problem: Respiratory: Goal: Ability to maintain a clear airway and adequate ventilation will improve Outcome: Progressing   Problem: Fluid Volume: Goal: Ability to maintain a balanced intake and output will improve Outcome: Progressing   Problem: Metabolic: Goal: Ability to maintain appropriate glucose levels will improve Outcome: Progressing   Problem: Nutritional: Goal: Maintenance of adequate nutrition will improve Outcome: Progressing   Problem: Tissue Perfusion: Goal: Adequacy of tissue perfusion will improve Outcome: Progressing   Problem: Cardiac: Goal: Ability to maintain an adequate cardiac output will improve Outcome: Progressing   Problem: Metabolic: Goal: Ability to maintain appropriate glucose levels will improve Outcome: Progressing   Problem: Nutritional: Goal: Maintenance of adequate nutrition will improve Outcome: Progressing   Problem: Urinary Elimination: Goal: Ability to achieve and maintain adequate renal perfusion and functioning will improve Outcome: Progressing

## 2021-10-18 DIAGNOSIS — E232 Diabetes insipidus: Secondary | ICD-10-CM | POA: Diagnosis not present

## 2021-10-18 DIAGNOSIS — I609 Nontraumatic subarachnoid hemorrhage, unspecified: Secondary | ICD-10-CM | POA: Diagnosis not present

## 2021-10-18 DIAGNOSIS — J988 Other specified respiratory disorders: Secondary | ICD-10-CM | POA: Diagnosis not present

## 2021-10-18 LAB — BASIC METABOLIC PANEL
Anion gap: 7 (ref 5–15)
BUN: 40 mg/dL — ABNORMAL HIGH (ref 6–20)
BUN: 40 mg/dL — ABNORMAL HIGH (ref 6–20)
BUN: 43 mg/dL — ABNORMAL HIGH (ref 6–20)
CO2: 29 mmol/L (ref 22–32)
CO2: 29 mmol/L (ref 22–32)
CO2: 25 mmol/L (ref 22–32)
Calcium: 12.1 mg/dL — ABNORMAL HIGH (ref 8.9–10.3)
Calcium: 11.5 mg/dL — ABNORMAL HIGH (ref 8.9–10.3)
Calcium: 11.1 mg/dL — ABNORMAL HIGH (ref 8.9–10.3)
Chloride: >130 mmol/L (ref 98–111)
Chloride: >130 mmol/L (ref 98–111)
Chloride: 127 mmol/L — ABNORMAL HIGH (ref 98–111)
Creatinine, Ser: 1.89 mg/dL — ABNORMAL HIGH (ref 0.44–1.00)
Creatinine, Ser: 1.85 mg/dL — ABNORMAL HIGH (ref 0.44–1.00)
Creatinine, Ser: 1.85 mg/dL — ABNORMAL HIGH (ref 0.44–1.00)
GFR, Estimated: 33 mL/min — ABNORMAL LOW (ref >60)
GFR, Estimated: 33 mL/min — ABNORMAL LOW (ref >60)
GFR, Estimated: 33 mL/min — ABNORMAL LOW (ref >60)
Glucose, Bld: 253 mg/dL — ABNORMAL HIGH (ref 70–99)
Glucose, Bld: 371 mg/dL — ABNORMAL HIGH (ref 70–99)
Glucose, Bld: 446 mg/dL — ABNORMAL HIGH (ref 70–99)
Potassium: 3.8 mmol/L (ref 3.5–5.1)
Potassium: 3.4 mmol/L — ABNORMAL LOW (ref 3.5–5.1)
Potassium: 2.6 mmol/L — CL (ref 3.5–5.1)
Sodium: 167 mmol/L (ref 135–145)
Sodium: 169 mmol/L (ref 135–145)
Sodium: 159 mmol/L — ABNORMAL HIGH (ref 135–145)

## 2021-10-18 LAB — GLUCOSE, CAPILLARY
Glucose-Capillary: 174 mg/dL — ABNORMAL HIGH (ref 70–99)
Glucose-Capillary: 177 mg/dL — ABNORMAL HIGH (ref 70–99)
Glucose-Capillary: 211 mg/dL — ABNORMAL HIGH (ref 70–99)
Glucose-Capillary: 224 mg/dL — ABNORMAL HIGH (ref 70–99)
Glucose-Capillary: 264 mg/dL — ABNORMAL HIGH (ref 70–99)
Glucose-Capillary: 280 mg/dL — ABNORMAL HIGH (ref 70–99)

## 2021-10-18 MED ORDER — FREE WATER
200.0000 mL | Status: DC
  Administered 2021-10-18 – 2021-10-24 (×37): 200 mL

## 2021-10-18 MED ORDER — POTASSIUM CHLORIDE 10 MEQ/100ML IV SOLN: 10.0000 meq | INTRAVENOUS | Status: DC

## 2021-10-18 MED ORDER — DEXTROSE 5 % IV SOLN: INTRAVENOUS | Status: DC

## 2021-10-18 MED ORDER — DESMOPRESSIN ACETATE 4 MCG/ML IJ SOLN
0.5000 ug | Freq: Two times a day (BID) | INTRAMUSCULAR | Status: DC
  Filled 2021-10-18: qty 1

## 2021-10-18 MED ORDER — NOREPINEPHRINE 16 MG/250ML-% IV SOLN
0.0000 ug/min | INTRAVENOUS | Status: DC
  Administered 2021-10-18: 17 ug/min via INTRAVENOUS
  Administered 2021-10-19: 10 ug/min via INTRAVENOUS
  Administered 2021-10-20: 11 ug/min via INTRAVENOUS
  Administered 2021-10-21: 9 ug/min via INTRAVENOUS
  Administered 2021-10-22: 5 ug/min via INTRAVENOUS
  Administered 2021-10-23: 7 ug/min via INTRAVENOUS
  Administered 2021-10-23: 13 ug/min via INTRAVENOUS
  Administered 2021-10-23: 25 ug/min via INTRAVENOUS
  Administered 2021-10-25: 11 ug/min via INTRAVENOUS
  Administered 2021-10-26: 10 ug/min via INTRAVENOUS
  Administered 2021-10-28: 7 ug/min via INTRAVENOUS
  Administered 2021-10-30: 1 ug/min via INTRAVENOUS
  Filled 2021-10-18 (×9): qty 250

## 2021-10-18 MED ORDER — POTASSIUM CHLORIDE 20 MEQ PO PACK
40.0000 meq | PACK | ORAL | Status: AC
  Administered 2021-10-18 (×2): 40 meq via ORAL
  Filled 2021-10-18 (×2): qty 2

## 2021-10-18 MED ORDER — POTASSIUM CHLORIDE 10 MEQ/100ML IV SOLN
10.0000 meq | INTRAVENOUS | Status: AC
  Administered 2021-10-18 (×2): 10 meq via INTRAVENOUS
  Filled 2021-10-18 (×2): qty 100

## 2021-10-18 MED ORDER — DESMOPRESSIN ACETATE 4 MCG/ML IJ SOLN
2.0000 ug | Freq: Two times a day (BID) | INTRAMUSCULAR | Status: AC
  Administered 2021-10-18: 2 ug via INTRAVENOUS
  Filled 2021-10-18: qty 1

## 2021-10-18 MED ORDER — DESMOPRESSIN ACETATE 4 MCG/ML IJ SOLN
1.0000 ug | Freq: Two times a day (BID) | INTRAMUSCULAR | Status: DC
  Administered 2021-10-18: 1 ug via INTRAVENOUS
  Filled 2021-10-18: qty 1

## 2021-10-18 NOTE — Progress Notes (Addendum)
Date and time results received: 10/18/21 1901   Test: K Critical Value: 2.6    Elink notified

## 2021-10-18 NOTE — Progress Notes (Signed)
eLink Physician-Brief Progress Note Patient Name: [name] DOB: 15-Jan-1976 MRN: [id number]   Date of Service  10/18/2021  HPI/Events of Note  Notified of critically low potassium of 2.6  eICU Interventions  Placed repletion orders with IV and PO potassium chloride.  Will continue to monitor serial BMP and give additional correction as needed.         [place] 10/18/2021, 7:19 PM

## 2021-10-18 NOTE — Progress Notes (Signed)
eLink Physician-Brief Progress Note Patient Name: [name] DOB: 10-15-1975 MRN: [id number]   Date of Service  10/18/2021  HPI/Events of Note  Notified of Na at 167, chloride >130.  Pt with increase urine output of 3.2 L on day shifst and 2L overnight.   eICU Interventions  Give ddavp now.  Repeat BMP at noon.      Intervention Category Intermediate Interventions: Electrolyte abnormality - evaluation and management  Elsie Lincoln 10/18/2021, 5:34 AM

## 2021-10-18 NOTE — Progress Notes (Signed)
NAME:  [name], MRN:  [id number], DOB:  September 13, 1975, LOS: 75 ADMISSION DATE:  09/28/2021, CONSULTATION DATE: 10/07/2021 REFERRING MD: ED physician, CHIEF COMPLAINT: Subarachnoid hemorrhage  History of Present Illness:  46 yo female former smoker was found unresponsive by her husband and EMS called.  She was making gurgling noises with her breathing.  She was found to have SVT and EMS performed DCCV.  She had GCS 4 in ER and intubated for airway protection.  Found to have large SAH and neurosurgery arranged for hospital admission.  PCCM consulted to assist with management in ICU.  Pertinent  Medical History  Hypertension, uterine fibroids Legally blind in left eye  Significant Hospital Events: Including procedures, antibiotic start and stop dates in addition to other pertinent events   9/07 presented unresponsive, hypotensive; CT head with subarachnoid hemorrhage 9/11 DDAVP give for Central DI, repeat head CT with worsening infarcts and herniation 9/12 Attempted transfer for second opinion at [facility] and [facility].  No beds at [facility] and transfer was declined at [facility] 9/13 no change in mental status.  Transfer to [facility] declined 9/14 [facility] declined transfer again.  No change.  DDAVP x 1 9/18 palliative care consulted 9/23 Na increased >> restart DDAVP and increase free water  Interim History / Subjective:  Started DDAVP overnight.  Objective   Blood pressure 96/62, pulse 97, temperature 99.1 F (37.3 C), resp. rate (!) 24, height '5\' 2"'$  (1.575 m), weight 69.2 kg, SpO2 97 %.    Vent Mode: PRVC FiO2 (%):  [30 %] 30 % Set Rate:  [24 bmp] 24 bmp Vt Set:  [400 mL] 400 mL PEEP:  [5 cmH20] 5 cmH20 Plateau Pressure:  [16 cmH20-17 cmH20] 17 cmH20   Intake/Output Summary (Last 24 hours) at 10/18/2021 3382 Last data filed at 10/18/2021 0800 Gross per 24 hour  Intake 2509.15 ml  Output 5475 ml  Net -2965.85 ml   Filed Weights   10/15/21 0500 10/16/21 0500 10/17/21 0500  Weight: 79.2 kg  77.6 kg 69.2 kg    Examination:  General - unresponsive Eyes - pupils fixed ENT - ETT in place Cardiac - regular rate/rhythm, no murmur Chest - equal breath sounds b/l, no wheezing or rales Abdomen - soft, non tender, + bowel sounds Extremities - no cyanosis, clubbing, or edema Skin - no rashes Neuro - no response to stimuli    Resolved Hospital Problem list   Aspiration pneumonitis >> completed ABx 9/13, AKI from hypovolemia  Assessment & Plan:   Compromised airway in setting of SAH. - not a candidate for vent weaning due to neurologic status - goal SpO2 > 92% - tentative plan for trach on 9/25  H/H 5 SAH. - very poor prognosis for meaningful neurologic recovery - palliative care and ethics team consulted  Central diabetes insipidus. - continue DDAVP bid for now - increase free water tube flush to 200 ml q4h - add D5W at 50 ml/hr - f/u BMET q12h for now  Hypotension. - pressors to keep MAP > 65  Best Practice (right click and "Reselect all SmartList Selections" daily)   Diet/type: tubefeeds DVT prophylaxis: SCD GI prophylaxis: PPI Lines: Central line- RUE PICC Foley:  N/A Code Status:  full code Last date of multidisciplinary goals of care discussion: updated husband at bedside  Labs:      Latest Ref Rng & Units 10/18/2021    3:47 AM 10/16/2021    5:36 AM 10/15/2021    5:44 AM  CMP  Glucose 70 - 99 mg/dL 253  192    BUN 6 - 20 mg/dL 40  27    Creatinine 0.44 - 1.00 mg/dL 1.89  1.04    Sodium 135 - 145 mmol/L 167  C 141  150   Potassium 3.5 - 5.1 mmol/L 3.8  4.6    Chloride 98 - 111 mmol/L >130  110    CO2 22 - 32 mmol/L 29  23    Calcium 8.9 - 10.3 mg/dL 12.1  8.9      C Corrected result       Latest Ref Rng & Units 10/13/2021    3:30 AM 10/11/2021    8:55 PM 10/11/2021    5:08 AM  CBC  WBC 4.0 - 10.5 K/uL 11.6  9.0  10.7   Hemoglobin 12.0 - 15.0 g/dL 8.4  9.0  9.6   Hematocrit 36.0 - 46.0 % 28.3  29.2  30.7   Platelets 150 - 400 K/uL 146  123   108     ABG    Component Value Date/Time   PHART 7.409 10/16/2021 0804   PCO2ART 34.1 10/01/2021 0804   PO2ART 158 (H) 10/14/2021 0804   HCO3 21.8 10/08/2021 0804   TCO2 23 10/23/2021 0804   ACIDBASEDEF 3.0 (H) 10/08/2021 0804   O2SAT 99 09/30/2021 0804    CBG (last 3)  Recent Labs    10/17/21 2321 10/18/21 0320 10/18/21 0807  GLUCAP 112* 174* 177*    Critical care time: 34 minutes  Chesley Mires, MD [place] Pager - [phone] 10/18/2021, 8:32 AM

## 2021-10-18 NOTE — Progress Notes (Addendum)
Date and time results received: 10/18/21 2:26 PM  (use smartphrase ".now" to insert current time)  Test: Sodium Critical Value: 169  Name of Provider Notified: Halford Chessman, MD  Orders Received? Or Actions Taken?: Orders Received - See Orders for details  Increased DDAVP dose

## 2021-10-19 DIAGNOSIS — E232 Diabetes insipidus: Secondary | ICD-10-CM | POA: Diagnosis not present

## 2021-10-19 DIAGNOSIS — I609 Nontraumatic subarachnoid hemorrhage, unspecified: Secondary | ICD-10-CM | POA: Diagnosis not present

## 2021-10-19 LAB — GLUCOSE, CAPILLARY
Glucose-Capillary: 224 mg/dL — ABNORMAL HIGH (ref 70–99)
Glucose-Capillary: 275 mg/dL — ABNORMAL HIGH (ref 70–99)
Glucose-Capillary: 307 mg/dL — ABNORMAL HIGH (ref 70–99)
Glucose-Capillary: 237 mg/dL — ABNORMAL HIGH (ref 70–99)
Glucose-Capillary: 273 mg/dL — ABNORMAL HIGH (ref 70–99)
Glucose-Capillary: 198 mg/dL — ABNORMAL HIGH (ref 70–99)

## 2021-10-19 LAB — BASIC METABOLIC PANEL
Anion gap: 5 (ref 5–15)
BUN: 41 mg/dL — ABNORMAL HIGH (ref 6–20)
BUN: 42 mg/dL — ABNORMAL HIGH (ref 6–20)
CO2: 25 mmol/L (ref 22–32)
CO2: 25 mmol/L (ref 22–32)
Calcium: 11.3 mg/dL — ABNORMAL HIGH (ref 8.9–10.3)
Calcium: 11.3 mg/dL — ABNORMAL HIGH (ref 8.9–10.3)
Chloride: >130 mmol/L (ref 98–111)
Chloride: 129 mmol/L — ABNORMAL HIGH (ref 98–111)
Creatinine, Ser: 1.73 mg/dL — ABNORMAL HIGH (ref 0.44–1.00)
Creatinine, Ser: 1.8 mg/dL — ABNORMAL HIGH (ref 0.44–1.00)
GFR, Estimated: 36 mL/min — ABNORMAL LOW (ref >60)
GFR, Estimated: 35 mL/min — ABNORMAL LOW (ref >60)
Glucose, Bld: 340 mg/dL — ABNORMAL HIGH (ref 70–99)
Glucose, Bld: 291 mg/dL — ABNORMAL HIGH (ref 70–99)
Potassium: 3.6 mmol/L (ref 3.5–5.1)
Potassium: 3.2 mmol/L — ABNORMAL LOW (ref 3.5–5.1)
Sodium: 161 mmol/L (ref 135–145)
Sodium: 159 mmol/L — ABNORMAL HIGH (ref 135–145)

## 2021-10-19 MED ORDER — INSULIN ASPART 100 UNIT/ML IJ SOLN
0.0000 [IU] | INTRAMUSCULAR | Status: DC
  Administered 2021-10-19: 11 [IU] via SUBCUTANEOUS
  Administered 2021-10-19: 7 [IU] via SUBCUTANEOUS
  Administered 2021-10-20 – 2021-10-21 (×3): 4 [IU] via SUBCUTANEOUS

## 2021-10-19 MED ORDER — POTASSIUM CHLORIDE 20 MEQ PO PACK
40.0000 meq | PACK | Freq: Once | ORAL | Status: AC
  Administered 2021-10-20: 40 meq
  Filled 2021-10-19: qty 2

## 2021-10-19 MED ORDER — DESMOPRESSIN ACETATE 4 MCG/ML IJ SOLN
2.0000 ug | Freq: Two times a day (BID) | INTRAMUSCULAR | Status: AC
  Administered 2021-10-19 – 2021-10-20 (×4): 2 ug via INTRAVENOUS
  Filled 2021-10-19 (×3): qty 1

## 2021-10-19 MED ORDER — INSULIN ASPART 100 UNIT/ML IJ SOLN
2.0000 [IU] | INTRAMUSCULAR | Status: DC
  Administered 2021-10-19 – 2021-10-21 (×6): 2 [IU] via SUBCUTANEOUS

## 2021-10-19 NOTE — Progress Notes (Signed)
eLink Physician-Brief Progress Note Patient Name: [name] DOB: 29-Nov-1975 MRN: [id number]   Date of Service  10/19/2021  HPI/Events of Note  Sodium back up to 161 from 159.   eICU Interventions  Reordered DDAVP 10mg BID Continue free water flushes and D5W infusion Will continue to monitor serial sodium        Tangy Drozdowski M DELA CRUZ 10/19/2021, 5:22 AM

## 2021-10-19 NOTE — Progress Notes (Signed)
NAME:  [name], MRN:  [id number], DOB:  07-04-1975, LOS: 17 ADMISSION DATE:  09/30/2021, CONSULTATION DATE: 10/14/2021 REFERRING MD: ED physician, CHIEF COMPLAINT: Subarachnoid hemorrhage  History of Present Illness:  46 yo female former smoker was found unresponsive by her husband and EMS called.  She was making gurgling noises with her breathing.  She was found to have SVT and EMS performed DCCV.  She had GCS 4 in ER and intubated for airway protection.  Found to have large SAH and neurosurgery arranged for hospital admission.  PCCM consulted to assist with management in ICU.  Pertinent  Medical History  Hypertension, uterine fibroids Legally blind in left eye  Significant Hospital Events: Including procedures, antibiotic start and stop dates in addition to other pertinent events   9/07 presented unresponsive, hypotensive; CT head with subarachnoid hemorrhage 9/11 DDAVP give for Central DI, repeat head CT with worsening infarcts and herniation 9/12 Attempted transfer for second opinion at [facility] and [facility].  No beds at [facility] and transfer was declined at [facility] 9/13 no change in mental status.  Transfer to [facility] declined 9/14 [facility] declined transfer again.  No change.  DDAVP x 1 9/18 palliative care consulted 9/23 Na increased >> restart DDAVP and increase free water  Interim History / Subjective:  Still needs DDAVP.  Objective   Blood pressure (!) 89/52, pulse 91, temperature 99.3 F (37.4 C), temperature source Bladder, resp. rate (!) 24, height '5\' 2"'$  (1.575 m), weight 69.7 kg, SpO2 99 %.    Vent Mode: PRVC FiO2 (%):  [30 %] 30 % Set Rate:  [24 bmp] 24 bmp Vt Set:  [400 mL] 400 mL PEEP:  [5 cmH20] 5 cmH20 Plateau Pressure:  [16 cmH20-18 cmH20] 16 cmH20   Intake/Output Summary (Last 24 hours) at 10/19/2021 0843 Last data filed at 10/19/2021 0800 Gross per 24 hour  Intake 4668.58 ml  Output 1750 ml  Net 2918.58 ml   Filed Weights   10/16/21 0500 10/17/21 0500  10/19/21 0500  Weight: 77.6 kg 69.2 kg 69.7 kg    Examination:  General - unresponsive Eyes - pupils fixed ENT - ETT in place, protuberant tongue Cardiac - regular rate/rhythm, no murmur Chest - equal breath sounds b/l, no wheezing or rales Abdomen - soft, non tender, + bowel sounds Extremities - no cyanosis, clubbing, or edema Skin - no rashes Neuro - intermittently twitches  Resolved Hospital Problem list   Aspiration pneumonitis >> completed ABx 9/13, AKI from hypovolemia  Assessment & Plan:   Compromised airway in setting of SAH. - not a candidate for vent weaning due to neurologic status - goal SpO2 > 92% - plan for trach on 9/25  H/H 5 SAH. - very poor prognosis for meaningful neurologic recovery - family would like to continue supportive care  Central diabetes insipidus. - continue DDAVP 2 mg q12h for now  - increase D5W to 75 ml/hr - continue free water tube flush at 200 ml q4h - f/u BMET q12h  Hypotension. - pressors to keep MAP > 65  Best Practice (right click and "Reselect all SmartList Selections" daily)   Diet/type: tubefeeds DVT prophylaxis: SCD GI prophylaxis: PPI Lines: Central line- RUE PICC Foley:  N/A Code Status:  full code Last date of multidisciplinary goals of care discussion: updated her husband at bedside  Labs:      Latest Ref Rng & Units 10/19/2021    4:06 AM 10/18/2021    5:09 PM 10/18/2021   12:01 PM  CMP  Glucose 70 - 99 mg/dL 340  446  371   BUN 6 - 20 mg/dL 41  43  40   Creatinine 0.44 - 1.00 mg/dL 1.73  1.85  1.85   Sodium 135 - 145 mmol/L 161  159  169   Potassium 3.5 - 5.1 mmol/L 3.6  2.6  3.4   Chloride 98 - 111 mmol/L >130  127  >130   CO2 22 - 32 mmol/L '25  25  29   '$ Calcium 8.9 - 10.3 mg/dL 11.3  11.1  11.5        Latest Ref Rng & Units 10/13/2021    3:30 AM 10/11/2021    8:55 PM 10/11/2021    5:08 AM  CBC  WBC 4.0 - 10.5 K/uL 11.6  9.0  10.7   Hemoglobin 12.0 - 15.0 g/dL 8.4  9.0  9.6   Hematocrit 36.0 - 46.0  % 28.3  29.2  30.7   Platelets 150 - 400 K/uL 146  123  108     ABG    Component Value Date/Time   PHART 7.409 09/29/2021 0804   PCO2ART 34.1 10/22/2021 0804   PO2ART 158 (H) 10/09/2021 0804   HCO3 21.8 09/26/2021 0804   TCO2 23 10/07/2021 0804   ACIDBASEDEF 3.0 (H) 10/16/2021 0804   O2SAT 99 10/24/2021 0804    CBG (last 3)  Recent Labs    10/18/21 2347 10/19/21 0312 10/19/21 0735  GLUCAP 264* 224* 275*    Critical care time: 33 minutes  Chesley Mires, MD [place] Pager - [phone] 10/19/2021, 8:43 AM

## 2021-10-19 NOTE — Progress Notes (Signed)
eLink Physician-Brief Progress Note Patient Name: BRYNJA MARKER DOB: 1975/03/30 MRN: [id number]   Date of Service  10/19/2021  HPI/Events of Note  Notified of hypokalemia at 3.2, crea 1.80.  eICU Interventions  Replete K - 51mq Kcl via tube ordered.     Intervention Category Intermediate Interventions: Electrolyte abnormality - evaluation and management  VElsie Lincoln9/24/2023, 11:42 PM

## 2021-10-20 ENCOUNTER — Inpatient Hospital Stay (HOSPITAL_COMMUNITY): Payer: Medicare Other

## 2021-10-20 DIAGNOSIS — G935 Compression of brain: Secondary | ICD-10-CM

## 2021-10-20 DIAGNOSIS — I609 Nontraumatic subarachnoid hemorrhage, unspecified: Secondary | ICD-10-CM | POA: Diagnosis not present

## 2021-10-20 DIAGNOSIS — E876 Hypokalemia: Secondary | ICD-10-CM

## 2021-10-20 DIAGNOSIS — E232 Diabetes insipidus: Secondary | ICD-10-CM

## 2021-10-20 DIAGNOSIS — Z515 Encounter for palliative care: Secondary | ICD-10-CM | POA: Diagnosis not present

## 2021-10-20 DIAGNOSIS — S066X9A Traumatic subarachnoid hemorrhage with loss of consciousness of unspecified duration, initial encounter: Secondary | ICD-10-CM | POA: Diagnosis not present

## 2021-10-20 LAB — GLUCOSE, CAPILLARY
Glucose-Capillary: 104 mg/dL — ABNORMAL HIGH (ref 70–99)
Glucose-Capillary: 108 mg/dL — ABNORMAL HIGH (ref 70–99)
Glucose-Capillary: 118 mg/dL — ABNORMAL HIGH (ref 70–99)
Glucose-Capillary: 176 mg/dL — ABNORMAL HIGH (ref 70–99)
Glucose-Capillary: 97 mg/dL (ref 70–99)
Glucose-Capillary: 98 mg/dL (ref 70–99)

## 2021-10-20 LAB — BASIC METABOLIC PANEL
Anion gap: 8 (ref 5–15)
BUN: 35 mg/dL — ABNORMAL HIGH (ref 6–20)
CO2: 27 mmol/L (ref 22–32)
Calcium: 12 mg/dL — ABNORMAL HIGH (ref 8.9–10.3)
Chloride: 123 mmol/L — ABNORMAL HIGH (ref 98–111)
Creatinine, Ser: 1.56 mg/dL — ABNORMAL HIGH (ref 0.44–1.00)
GFR, Estimated: 41 mL/min — ABNORMAL LOW (ref >60)
Glucose, Bld: 150 mg/dL — ABNORMAL HIGH (ref 70–99)
Potassium: 3.3 mmol/L — ABNORMAL LOW (ref 3.5–5.1)
Sodium: 158 mmol/L — ABNORMAL HIGH (ref 135–145)

## 2021-10-20 LAB — CBC
HCT: 26.4 % — ABNORMAL LOW (ref 36.0–46.0)
Hemoglobin: 8 g/dL — ABNORMAL LOW (ref 12.0–15.0)
MCH: 30.1 pg (ref 26.0–34.0)
MCHC: 30.3 g/dL (ref 30.0–36.0)
MCV: 99.2 fL (ref 80.0–100.0)
Platelets: 362 10*3/uL (ref 150–400)
RBC: 2.66 MIL/uL — ABNORMAL LOW (ref 3.87–5.11)
RDW: 14.8 % (ref 11.5–15.5)
WBC: 11.4 10*3/uL — ABNORMAL HIGH (ref 4.0–10.5)
nRBC: 1.5 % — ABNORMAL HIGH (ref 0.0–0.2)

## 2021-10-20 MED ORDER — MIDAZOLAM HCL 2 MG/2ML IJ SOLN
5.0000 mg | Freq: Once | INTRAMUSCULAR | Status: AC
  Administered 2021-10-20: 5 mg via INTRAVENOUS
  Filled 2021-10-20: qty 6

## 2021-10-20 MED ORDER — MIDAZOLAM HCL 2 MG/2ML IJ SOLN: 5.0000 mg | Freq: Once | INTRAMUSCULAR | Status: DC

## 2021-10-20 MED ORDER — FENTANYL CITRATE PF 50 MCG/ML IJ SOSY
200.0000 ug | PREFILLED_SYRINGE | Freq: Once | INTRAMUSCULAR | Status: AC
  Administered 2021-10-20: 200 ug via INTRAVENOUS
  Filled 2021-10-20: qty 4

## 2021-10-20 MED ORDER — MIDODRINE HCL 5 MG PO TABS
5.0000 mg | ORAL_TABLET | Freq: Three times a day (TID) | ORAL | Status: DC
  Administered 2021-10-20 – 2021-10-21 (×2): 5 mg
  Filled 2021-10-20 (×2): qty 1

## 2021-10-20 MED ORDER — POTASSIUM CHLORIDE 20 MEQ PO PACK
40.0000 meq | PACK | ORAL | Status: AC
  Administered 2021-10-20 (×2): 40 meq
  Filled 2021-10-20 (×2): qty 2

## 2021-10-20 MED ORDER — ETOMIDATE 2 MG/ML IV SOLN
20.0000 mg | Freq: Once | INTRAVENOUS | Status: AC
  Administered 2021-10-20: 20 mg via INTRAVENOUS
  Filled 2021-10-20: qty 10

## 2021-10-20 MED ORDER — FENTANYL CITRATE PF 50 MCG/ML IJ SOSY: 200.0000 ug | PREFILLED_SYRINGE | Freq: Once | INTRAMUSCULAR | Status: DC

## 2021-10-20 MED ORDER — ROCURONIUM BROMIDE 10 MG/ML (PF) SYRINGE
100.0000 mg | PREFILLED_SYRINGE | Freq: Once | INTRAVENOUS | Status: AC
  Administered 2021-10-20: 100 mg via INTRAVENOUS
  Filled 2021-10-20: qty 10

## 2021-10-20 MED ORDER — ROCURONIUM BROMIDE 10 MG/ML (PF) SYRINGE
100.0000 mg | PREFILLED_SYRINGE | Freq: Once | INTRAVENOUS | Status: DC
  Filled 2021-10-20: qty 10

## 2021-10-20 MED ORDER — HEPARIN SODIUM (PORCINE) 5000 UNIT/ML IJ SOLN
5000.0000 [IU] | Freq: Three times a day (TID) | INTRAMUSCULAR | Status: DC
  Administered 2021-10-21 – 2021-11-13 (×68): 5000 [IU] via SUBCUTANEOUS
  Filled 2021-10-20 (×68): qty 1

## 2021-10-20 NOTE — Progress Notes (Signed)
NAME:  [name], MRN:  [id number], DOB:  04/27/1975, LOS: 74 ADMISSION DATE:  10/23/2021, CONSULTATION DATE: 10/18/2021 REFERRING MD: ED physician, CHIEF COMPLAINT: Subarachnoid hemorrhage  History of Present Illness:  46 yo female former smoker was found unresponsive by her husband and EMS called.  She was making gurgling noises with her breathing.  She was found to have SVT and EMS performed DCCV.  She had GCS 4 in ER and intubated for airway protection.  Found to have large SAH and neurosurgery arranged for hospital admission.  PCCM consulted to assist with management in ICU.  Pertinent  Medical History  Hypertension, uterine fibroids Legally blind in left eye  Significant Hospital Events: Including procedures, antibiotic start and stop dates in addition to other pertinent events   9/07 presented unresponsive, hypotensive; CT head with subarachnoid hemorrhage 9/11 DDAVP give for Central DI, repeat head CT with worsening infarcts and herniation 9/12 Attempted transfer for second opinion at [facility] and [facility].  No beds at [facility] and transfer was declined at [facility] 9/13 no change in mental status.  Transfer to [facility] declined 9/14 [facility] declined transfer again.  No change.  DDAVP x 1 9/18 palliative care consulted 9/23 Na increased >> restart DDAVP and increase free water 9/25 plan for trach. Increased pressor requirement   Interim History / Subjective:  Ne up to 12 Getting K replaced   Objective   Blood pressure 97/67, pulse 78, temperature 99 F (37.2 C), resp. rate (!) 24, height '5\' 2"'$  (1.575 m), weight 69.9 kg, SpO2 98 %.    Vent Mode: PRVC FiO2 (%):  [30 %-40 %] 30 % Set Rate:  [24 bmp] 24 bmp Vt Set:  [400 mL] 400 mL PEEP:  [5 cmH20] 5 cmH20 Plateau Pressure:  [16 cmH20-17 cmH20] 17 cmH20   Intake/Output Summary (Last 24 hours) at 10/20/2021 1029 Last data filed at 10/20/2021 1000 Gross per 24 hour  Intake 4011.78 ml  Output 1705 ml  Net 2306.78 ml   Filed Weights    10/17/21 0500 10/19/21 0500 10/20/21 0500  Weight: 69.2 kg 69.7 kg 69.9 kg    Examination:  General - critically ill appearing adult F intubated  Neuro: moves head/neck to sternal rub.  HEENT: ETT secure, protuberant tongue  Cardiac - rrr s1s2  Pulm- scattered rales. Mechanically ventilated Abdomen - soft ndnt  Extremities - BUE edema  Skin - c/d/w   Resolved Hospital Problem list   Aspiration pneumonitis >> completed ABx 9/13, AKI from hypovolemia  Assessment & Plan:   Compromised airway in setting of [place] - not a candidate for vent weaning due to neurologic status - goal SpO2 > 92% - plan for trach on 9/25-- pre-trach orders placed  H/H 5 SAH, with brain compression, brainstem herniation  - very poor prognosis for meaningful neurologic recovery - family would like to continue supportive care  Central DI  - continue DDAVP 2 mg q12h for now  - D5W  75 ml/hr - continue free water tube flush at 200 ml q4h - q12 BMP  Hypotension  -titrate NE for MAP > 65   Hypokalemia  P -replace   AKI  P -trend renal indices, UOP  Hyperglycemia P -SSI    Best Practice (right click and "Reselect all SmartList Selections" daily)   Diet/type: tubefeeds -- NPO DVT prophylaxis: SCD GI prophylaxis: PPI Lines: Central line- RUE PICC Foley:  N/A Code Status:  full code Last date of multidisciplinary goals of care discussion: husband  updated 9.25  Labs:      Latest Ref Rng & Units 10/20/2021    4:50 AM 10/19/2021    4:37 PM 10/19/2021    4:06 AM  CMP  Glucose 70 - 99 mg/dL 150  291  340   BUN 6 - 20 mg/dL 35  42  41   Creatinine 0.44 - 1.00 mg/dL 1.56  1.80  1.73   Sodium 135 - 145 mmol/L 158  159  161   Potassium 3.5 - 5.1 mmol/L 3.3  3.2  3.6   Chloride 98 - 111 mmol/L 123  129  >130   CO2 22 - 32 mmol/L '27  25  25   '$ Calcium 8.9 - 10.3 mg/dL 12.0  11.3  11.3        Latest Ref Rng & Units 10/13/2021    3:30 AM 10/11/2021    8:55 PM 10/11/2021    5:08 AM  CBC   WBC 4.0 - 10.5 K/uL 11.6  9.0  10.7   Hemoglobin 12.0 - 15.0 g/dL 8.4  9.0  9.6   Hematocrit 36.0 - 46.0 % 28.3  29.2  30.7   Platelets 150 - 400 K/uL 146  123  108     ABG    Component Value Date/Time   PHART 7.409 10/22/2021 0804   PCO2ART 34.1 10/08/2021 0804   PO2ART 158 (H) 10/17/2021 0804   HCO3 21.8 09/26/2021 0804   TCO2 23 10/09/2021 0804   ACIDBASEDEF 3.0 (H) 09/30/2021 0804   O2SAT 99 10/09/2021 0804    CBG (last 3)  Recent Labs    10/19/21 2315 10/20/21 0318 10/20/21 0744  GLUCAP 198* 176* 108*    CRITICAL CARE Performed by: Cristal Generous   Total critical care time: 37 minutes  Critical care time was exclusive of separately billable procedures and treating other patients. Critical care was necessary to treat or prevent imminent or life-threatening deterioration.  Critical care was time spent personally by me on the following activities: development of treatment plan with patient and/or surrogate as well as nursing, discussions with consultants, evaluation of patient's response to treatment, examination of patient, obtaining history from patient or surrogate, ordering and performing treatments and interventions, ordering and review of laboratory studies, ordering and review of radiographic studies, pulse oximetry and re-evaluation of patient's condition.  Eliseo Gum MSN, AGACNP-BC [place] for pager  10/20/2021, 10:29 AM

## 2021-10-20 NOTE — Progress Notes (Signed)
Diagnostic Bronchoscopy  KATILIN RAYNES  [id number]  September 14, 1975  Date:10/20/21  Time:2:32 PM   Provider Performing:Shyler Hamill   Procedure: Diagnostic Bronchoscopy (55015)  Indication(s) Assist with direct visualization of tracheostomy placement  Consent Risks of the procedure as well as the alternatives and risks of each were explained to the patient and/or caregiver.  Consent for the procedure was obtained.   Anesthesia See separate tracheostomy note   Time Out Verified patient identification, verified procedure, site/side was marked, verified correct patient position, special equipment/implants available, medications/allergies/relevant history reviewed, required imaging and test results available.   Sterile Technique Usual hand hygiene, masks, gowns, and gloves were used   Procedure Description Bronchoscope advanced through endotracheal tube and into airway.  After suctioning out tracheal secretions, bronchoscope used to provide direct visualization of tracheostomy placement.  Prior to preforming tracheostomy, white secretions were aspirated for airway and a BAL of the right lower lobe was performed.    Complications/Tolerance None; patient tolerated the procedure well.   EBL None  Specimen(s) BAL  Kipp Brood, MD [facility] ICU Physician [place]  Pager: [phone] Or Epic Secure Chat After hours: [phone].  10/20/2021, 2:34 PM

## 2021-10-20 NOTE — Procedures (Signed)
Percutaneous Tracheostomy Procedure Note   AUDA FINFROCK  [id number]  1975/09/25  Date:10/20/21  Time:3:48 PM   Provider Performing:Nickoli Bagheri C Tamala Julian  Procedure: Percutaneous Tracheostomy with Bronchoscopic Guidance (22979)  Indication(s) Persistent respiratory failure  Consent Risks of the procedure as well as the alternatives and risks of each were explained to the patient and/or caregiver.  Consent for the procedure was obtained.  Anesthesia Etomidate, Versed, Fentanyl, Vecuronium   Time Out Verified patient identification, verified procedure, site/side was marked, verified correct patient position, special equipment/implants available, medications/allergies/relevant history reviewed, required imaging and test results available.   Sterile Technique Maximal sterile technique including sterile barrier drape, hand hygiene, sterile gown, sterile gloves, mask, hair covering.    Procedure Description Appropriate anatomy identified by palpation.  Patient's neck prepped and draped in sterile fashion.  1% lidocaine with epinephrine was used to anesthetize skin overlying neck.  1.5cm incision made and blunt dissection performed until tracheal rings could be easily palpated.   Then a size 6-0 Shiley tracheostomy was placed under bronchoscopic visualization using usual Seldinger technique and serial dilation.   Bronchoscope confirmed placement above the carina.  Tracheostomy was sutured in place with adhesive pad to protect skin under pressure.    Patient connected to ventilator.   Complications/Tolerance Lurline Idol is a little low due to her height but easy to pass suction catheter, should not be an issue but if it is we can downsize to peds at later date. Chest X-ray is ordered to confirm no post-procedural complication.   EBL Minimal   Specimen(s) None

## 2021-10-20 NOTE — Progress Notes (Signed)
Patient ID: [name], female   DOB: 05/15/1975, 46 y.o.   MRN: [id number]    Progress Note from the Palliative Medicine Team at [facility]   Patient Name: [name]        Date: 10/20/2021 DOB: 08-31-1975  Age: 46 y.o. MRN#: [id number] Attending Physician: Ashok Pall, MD Primary Care Physician: Glendon Axe, MD Admit Date: 10/11/2021   Medical records reviewed   46 year old female with hypertension who presented to the emergency room after being found unresponsive by her husband.  Patient was last seen at her baseline around 6 AM, patient's husband noticed that she was "breathing funny" and making gurgling noises.  He attempted to wake her and she was unresponsive, 47 year old daughter called EMS.  He started CPR and upon EMS arrival the patient was in SVT and they performed DCCV.  Heart rate improved.  There was not improvement in her mental status.   GSA 4 on arrival to the emergency room, proceed with emergent intubation.  Large SAH on CT of the head.  Neurosurgery and PCCM consulted.  Significant Hospital events: 9/7-presented unresponsive, hypotensive 9/7-CT head with subarachnoid hemorrhage 9/11- DDAVP give for Central DI, repeat head CT with worsening infarcts and herniation 9/12- Attempted transfer for second opinion at [facility] and [facility].  No beds at [facility] and transfer was declined at [facility] 9/13-no change in mental status.  Transfer to [facility] declined 9/14- [facility] declined transfer again.   9/23 Na increased >> restart DDAVP and increase free water 9/25 plan for trach/PEG Increased pressor requirement. Added midodrine     Initial  PMT consult on 10-13-21/ Walden Field NP.      This NP assessed patient at the bedside and spoke to son/Kashon as continued palliative medicine support.  Patient on tells me that his mother will have the trach procedure today.  No questions or concerns at this time.  Emotional support offered.  Education offered today regarding  the  importance of continued conversation with his family and the medical providers regarding overall plan of care and treatment options,  ensuring decisions are within the context of the patients values and GOCs.  PMT will continue to support holistically  Questions and concerns addressed   Discussed with treatment team  Wadie Lessen NP  Palliative Medicine Team Team Phone # [phone] Pager [phone]

## 2021-10-20 NOTE — Procedures (Signed)
Bedside Tracheostomy Insertion Procedure Note   Patient Details:   Name: [name] DOB: 1975-10-01 MRN: [id number]  Procedure: Tracheostomy  Pre Procedure Assessment: ET Tube Size: 7.5 ET Tube secured at lip (cm): 23 Bite block in place: No Breath Sounds: Clear and Diminished  Post Procedure Assessment: BP 113/81   Pulse 81   Temp 97.9 F (36.6 C)   Resp (!) 24   Ht '5\' 2"'$  (1.575 m)   Wt 69.9 kg   SpO2 98%   BMI 28.19 kg/m  O2 sats: stable throughout Complications: No apparent complications Patient did tolerate procedure well Tracheostomy Brand:Shiley Tracheostomy Style:Cuffed Tracheostomy Size:  6 Tracheostomy Secured NWG:[id number], velcro Tracheostomy Placement Confirmation:Trach cuff visualized and in place and Chest X ray ordered for placement    Kathie Dike 10/20/2021, 2:46 PM

## 2021-10-20 NOTE — Progress Notes (Signed)
Nutrition Follow-up  DOCUMENTATION CODES:   Not applicable  INTERVENTION:  Continue TF via OG tube: Osmolite 1.5 at 50 ml/h (1200 ml per day) Prosource TF20 60 ml daily   Provides 1880 kcal, 95 gm protein, 912 ml free water daily    200 ml free water every 4 hours Total free water: 2112 ml   NUTRITION DIAGNOSIS:   Inadequate oral intake related to inability to eat as evidenced by NPO status. Ongoing  GOAL:   Patient will meet greater than or equal to 90% of their needs  Goal met via TF  MONITOR:   TF tolerance  REASON FOR ASSESSMENT:   Consult Enteral/tube feeding initiation and management  ASSESSMENT:   Pt with PMH of HTN and legally blind in L eye admitted after being found down by husband with CPR. Per CT pt with large SAH.  Pt discussed during ICU rounds and with RN and MD.  Plan for trach/PEG.  Spoke with family at bedside.   Medications: DDAVP, SSI, 2 units novolog every 4 hours, protonix, miralax (not given), senokot-s (not given) D5 @ 75 ml/hr Levophed @ 12 mcg   Labs: sodium 158, K 3.3  UOP: 1715 mL x24 hours I/O's: +6.6 L since admission  Diet Order:   Diet Order     None       EDUCATION NEEDS:   No education needs have been identified at this time  Skin:  Skin Assessment: Reviewed RN Assessment  Last BM:  100 ml via rectal tube  Height:   Ht Readings from Last 1 Encounters:  10/07/21 _0  (1.575 m)    Weight:   Wt Readings from Last 1 Encounters:  10/20/21 69.9 kg   BMI:  Body mass index is 28.19 kg/m.  Estimated Nutritional Needs:   Kcal:  1700-1900  Protein:  85-100 grams  Fluid:  >1.7 L/day  Lockie Pares., RD, LDN, CNSC See AMiON for contact information

## 2021-10-21 ENCOUNTER — Inpatient Hospital Stay (HOSPITAL_COMMUNITY): Payer: Medicare Other

## 2021-10-21 DIAGNOSIS — Z930 Tracheostomy status: Secondary | ICD-10-CM

## 2021-10-21 DIAGNOSIS — Z515 Encounter for palliative care: Secondary | ICD-10-CM | POA: Diagnosis not present

## 2021-10-21 DIAGNOSIS — S066X9A Traumatic subarachnoid hemorrhage with loss of consciousness of unspecified duration, initial encounter: Secondary | ICD-10-CM | POA: Diagnosis not present

## 2021-10-21 DIAGNOSIS — Z9189 Other specified personal risk factors, not elsewhere classified: Secondary | ICD-10-CM

## 2021-10-21 DIAGNOSIS — R4182 Altered mental status, unspecified: Secondary | ICD-10-CM | POA: Diagnosis not present

## 2021-10-21 DIAGNOSIS — Z7189 Other specified counseling: Secondary | ICD-10-CM | POA: Diagnosis not present

## 2021-10-21 DIAGNOSIS — K148 Other diseases of tongue: Secondary | ICD-10-CM

## 2021-10-21 DIAGNOSIS — I609 Nontraumatic subarachnoid hemorrhage, unspecified: Secondary | ICD-10-CM | POA: Diagnosis not present

## 2021-10-21 LAB — BASIC METABOLIC PANEL
Anion gap: 5 (ref 5–15)
Anion gap: 8 (ref 5–15)
BUN: 48 mg/dL — ABNORMAL HIGH (ref 6–20)
BUN: 59 mg/dL — ABNORMAL HIGH (ref 6–20)
CO2: 23 mmol/L (ref 22–32)
CO2: 24 mmol/L (ref 22–32)
Calcium: 11 mg/dL — ABNORMAL HIGH (ref 8.9–10.3)
Calcium: 11.1 mg/dL — ABNORMAL HIGH (ref 8.9–10.3)
Chloride: 117 mmol/L — ABNORMAL HIGH (ref 98–111)
Chloride: 115 mmol/L — ABNORMAL HIGH (ref 98–111)
Creatinine, Ser: 2.55 mg/dL — ABNORMAL HIGH (ref 0.44–1.00)
Creatinine, Ser: 2.87 mg/dL — ABNORMAL HIGH (ref 0.44–1.00)
GFR, Estimated: 23 mL/min — ABNORMAL LOW (ref >60)
GFR, Estimated: 20 mL/min — ABNORMAL LOW (ref >60)
Glucose, Bld: 193 mg/dL — ABNORMAL HIGH (ref 70–99)
Glucose, Bld: 172 mg/dL — ABNORMAL HIGH (ref 70–99)
Potassium: 4 mmol/L (ref 3.5–5.1)
Potassium: 6.1 mmol/L — ABNORMAL HIGH (ref 3.5–5.1)
Sodium: 145 mmol/L (ref 135–145)
Sodium: 147 mmol/L — ABNORMAL HIGH (ref 135–145)

## 2021-10-21 LAB — GLUCOSE, CAPILLARY
Glucose-Capillary: 155 mg/dL — ABNORMAL HIGH (ref 70–99)
Glucose-Capillary: 214 mg/dL — ABNORMAL HIGH (ref 70–99)
Glucose-Capillary: 222 mg/dL — ABNORMAL HIGH (ref 70–99)
Glucose-Capillary: 171 mg/dL — ABNORMAL HIGH (ref 70–99)
Glucose-Capillary: 105 mg/dL — ABNORMAL HIGH (ref 70–99)
Glucose-Capillary: 113 mg/dL — ABNORMAL HIGH (ref 70–99)

## 2021-10-21 MED ORDER — LEVOFLOXACIN IN D5W 750 MG/150ML IV SOLN
750.0000 mg | INTRAVENOUS | Status: DC
  Administered 2021-10-21: 750 mg via INTRAVENOUS
  Filled 2021-10-21: qty 150

## 2021-10-21 MED ORDER — MIDODRINE HCL 5 MG PO TABS
10.0000 mg | ORAL_TABLET | Freq: Three times a day (TID) | ORAL | Status: DC
  Administered 2021-10-21 – 2021-10-27 (×18): 10 mg
  Filled 2021-10-21 (×18): qty 2

## 2021-10-21 MED ORDER — MIDODRINE HCL 5 MG PO TABS: 5.0000 mg | ORAL_TABLET | Freq: Three times a day (TID) | ORAL | Status: DC

## 2021-10-21 MED ORDER — INSULIN ASPART 100 UNIT/ML IJ SOLN
0.0000 [IU] | INTRAMUSCULAR | Status: DC
  Administered 2021-10-21: 3 [IU] via SUBCUTANEOUS
  Administered 2021-10-21 (×2): 5 [IU] via SUBCUTANEOUS
  Administered 2021-10-22: 3 [IU] via SUBCUTANEOUS
  Administered 2021-10-22: 2 [IU] via SUBCUTANEOUS
  Administered 2021-10-22 – 2021-10-23 (×3): 3 [IU] via SUBCUTANEOUS
  Administered 2021-10-23 (×2): 5 [IU] via SUBCUTANEOUS
  Administered 2021-10-23: 2 [IU] via SUBCUTANEOUS
  Administered 2021-10-23: 5 [IU] via SUBCUTANEOUS
  Administered 2021-10-23: 3 [IU] via SUBCUTANEOUS
  Administered 2021-10-24: 2 [IU] via SUBCUTANEOUS
  Administered 2021-10-24 (×2): 5 [IU] via SUBCUTANEOUS
  Administered 2021-10-24 – 2021-10-26 (×5): 2 [IU] via SUBCUTANEOUS
  Administered 2021-10-26 (×2): 3 [IU] via SUBCUTANEOUS
  Administered 2021-10-26: 2 [IU] via SUBCUTANEOUS
  Administered 2021-10-27: 5 [IU] via SUBCUTANEOUS
  Administered 2021-10-27: 3 [IU] via SUBCUTANEOUS
  Administered 2021-10-27: 2 [IU] via SUBCUTANEOUS
  Administered 2021-10-27: 8 [IU] via SUBCUTANEOUS
  Administered 2021-10-28: 3 [IU] via SUBCUTANEOUS
  Administered 2021-10-28: 2 [IU] via SUBCUTANEOUS
  Administered 2021-10-29 (×2): 3 [IU] via SUBCUTANEOUS

## 2021-10-21 NOTE — Progress Notes (Signed)
Daily Progress Note   Patient Name: [name]       Date: 10/21/2021 DOB: [birth year]  Age: 46 y.o. MRN#: [id number] Attending Physician: Ashok Pall, MD Primary Care Physician: Glendon Axe, MD Admit Date: 10/17/2021 Length of Stay: 19 days  Reason for Consultation/Follow-up: Establishing goals of care and Psychosocial/spiritual support  HPI/Patient Profile:  46 year old female with hypertension who presented to the emergency room after being found unresponsive by her husband.  Patient was last seen at her baseline around 6 AM, patient's husband noticed that she was "breathing funny" and making gurgling noises.  He attempted to wake her and she was unresponsive, 95 year old daughter called EMS.  He started CPR and upon EMS arrival the patient was in SVT and they performed DCCV.  Heart rate improved.  There was not improvement in her mental status.   Subjective:   Subjective: Chart Reviewed. Updates received. Patient Assessed. Created space and opportunity for patient  and family to explore thoughts and feelings regarding current medical situation.  Today's Discussion: Today met with the patient's son at the bedside.  He was standing by his mother at bedside looking at her and holding her hand.  We discussed current situation and any updates.  I asked him how he felt the ethics meeting went and he stated that they have decided not to do brain death testing.  However, he understands that his mom is still very sick.  They are still hopeful for miracle and wanting more time.  They are accepting of all available medical care.  He feels her right hand was significantly swollen but seems to be improved.  He is hopeful that her tongue swelling will improve as well.  We discussed use of a bear hugger for temperature regulation.  I explained that difficulty regulating once body temperature is common in neurological injury.  He verbalized understanding.  He did ask about what appears to be some drainage  in her lower eyelid.  After examining it I explained it is either drainage or mild ocular edema which can happen and increased intracranial pressure with an associated brain bleed.  I informed him that I would asked the nurse to come see if it is drainage that can be cleaned away.  We also gust plan for evaluation for possible PEG tube placement.  I continue to offered ongoing support with palliative medicine.  I discussed the decision at the ethics meeting to try to have a once a week meeting with all invested family members present to update on where we are at and have appropriate discussions.  I stated that 1 week would be either Thursday or Friday this week.  He states he will speak with his family and try to coordinate a day/time that we can also down an update.  I offered that he can always call our service for any questions or concerns.  An update was received from the nurse and she indicates norepinephrine has been titrated down to 7.5, sodium has now improved to 145, received a trach yesterday and planning peg tube at some point in the near future.  I provided emotional and general support through therapeutic listening, empathy, sharing of stories, and other techniques. I answered all questions and addressed all concerns to the best of my ability.  Review of Systems  Unable to perform ROS: Intubated    Objective:   Vital Signs:  BP 105/73 (BP Location: Left Arm)   Pulse 92   Temp 98.4 F (36.9 C) (Oral)  Resp (!) 22   Ht _0  (1.575 m)   Wt 73.2 kg   SpO2 97%   BMI 29.52 kg/m   Physical Exam: Physical Exam Vitals and nursing note reviewed.  Constitutional:      General: She is not in acute distress.    Appearance: She is obese. She is ill-appearing.     Interventions: She is intubated.  HENT:     Head: Normocephalic and atraumatic.     Mouth/Throat:     Comments: Noted tongue swelling Trach in place and ventilator attached Cardiovascular:     Rate and Rhythm: Normal  rate.  Pulmonary:     Effort: No respiratory distress. She is intubated.     Comments: Intubated and on full support Abdominal:     General: Abdomen is flat.     Palpations: Abdomen is soft.  Neurological:     Mental Status: She is unresponsive.     Palliative Assessment/Data: 10% (NG Tube for tube feeding)   Assessment & Plan:   Impression: Present on Admission:  Subarachnoid hematoma ([place])  46 year old female with catastrophic subarachnoid hemorrhage and apparent impending herniation.  She is unresponsive at this time.  Neurosurgery is following.  Family seems to be grieving substantially and having difficulty excepting the patient's current condition.  They have requested transfer for second opinion but all tertiary care centers in the area have declined to accept her.  Family is currently against brain death testing, also deemed not necessary at recent ethics meeting.  Family currently in line with a full code, full scope.  They are requesting time and hoping for a miracle.  It has been communicated to them the grave situation she is in.  Given the degree of her injury and ramifications subsequent to, overall prognosis is grave.  SUMMARY OF RECOMMENDATIONS   Full code, full scope at this time Continue support of patient and family Continue attempts to establish a relationship to help with grieving and support. Tentatively plan for family meeting/update end of this week PMT will continue to follow  Symptom Management:  Per primary team PMT is available to assist as needed  Code Status: Full code  Prognosis: Unable to determine  Discharge Planning: To Be Determined  Discussed with: Patient's family, medical team, nursing team  Thank you for allowing [place] to participate in the care of [name] PMT will continue to support holistically.  Billing based on MDM: High  Problems Addressed: One acute or chronic illness or injury that poses a threat to life or bodily  function  Amount and/or Complexity of Data: Category 3:Discussion of management or test interpretation with external physician/other qualified health care professional/appropriate source (not separately reported)  Risks: N/A   Walden Field, NP Palliative Medicine Team  Team Phone # [phone] (Nights/Weekends)  09/24/2020, 8:17 AM

## 2021-10-21 NOTE — Progress Notes (Addendum)
Patient ID: [name], female   DOB: Oct 26, 1975, 46 y.o.   MRN: [id number] BP 125/81   Pulse 93   Temp 99 F (37.2 C) (Oral)   Resp (!) 24   Ht '5\' 2"'$  (1.575 m)   Wt 73.2 kg   SpO2 96%   BMI 29.52 kg/m  Comatose. There is no change neurologically Skin integrity is worsening  Prognosis is grave.  No reasonable expectation of improvement.  Pupils not responsive to light, dilated 34m No oculocehalics No corneal reflexes No cough No gag

## 2021-10-21 NOTE — Progress Notes (Signed)
Routine trach care provided to patient at this time. Pt tolerated without difficulty. Morning SBT performed with pt at this time also. Pt placed on 8/5 30% with no pt effort. Backup settings kicked in. Pt returned to full support at this time. RN and CCM NP aware. RT will continue to monitor and be available as needed.

## 2021-10-21 NOTE — Procedures (Signed)
Cortrak ? ?Person Inserting Tube:  [name] C, RD ?Tube Type:  Cortrak - 43 inches ?Tube Size:  10 ?Tube Location:  Left nare ?Secured by: Bridle ?Technique Used to Measure Tube Placement:  Marking at nare/corner of mouth ?Cortrak Secured At:  73 cm ? ?Cortrak Tube Team Note: ? ?Consult received to place a Cortrak feeding tube.  ? ?X-ray is required, abdominal x-ray has been ordered by the Cortrak team. Please confirm tube placement before using the Cortrak tube.  ? ?If the tube becomes dislodged please keep the tube and contact the Cortrak team at [email or web address] (password [email or web address]) for replacement.  ?If after hours and replacement cannot be delayed, place a NG tube and confirm placement with an abdominal x-ray.  ? ?[name] P., RD, LDN, CNSC ?See AMiON for contact information  ? ? ?

## 2021-10-21 NOTE — Progress Notes (Signed)
NAME:  [name], MRN:  [id number], DOB:  07-24-1975, LOS: 22 ADMISSION DATE:  10/13/2021, CONSULTATION DATE: 10/24/2021 REFERRING MD: ED physician, CHIEF COMPLAINT: Subarachnoid hemorrhage  History of Present Illness:  46 yo female former smoker was found unresponsive by her husband and EMS called.  She was making gurgling noises with her breathing.  She was found to have SVT and EMS performed DCCV.  She had GCS 4 in ER and intubated for airway protection.  Found to have large SAH and neurosurgery arranged for hospital admission.  PCCM consulted to assist with management in ICU.  Pertinent  Medical History  Hypertension, uterine fibroids Legally blind in left eye  Significant Hospital Events: Including procedures, antibiotic start and stop dates in addition to other pertinent events   9/07 presented unresponsive, hypotensive; CT head with subarachnoid hemorrhage 9/11 DDAVP give for Central DI, repeat head CT with worsening infarcts and herniation 9/12 Attempted transfer for second opinion at [facility] and [facility].  No beds at [facility] and transfer was declined at [facility] 9/13 no change in mental status.  Transfer to [facility] declined 9/14 [facility] declined transfer again.  No change.  DDAVP x 1 9/18 palliative care consulted 9/23 Na increased >> restart DDAVP and increase free water 9/25 plan for trach. Increased pressor requirement. Added midodrine  9/26 dc d5W. Weaning NE as able. On I/O cath protocol   Interim History / Subjective:  Trach yesterday  Completed add'l 4 doses DDAVP Na to 145 this morning   Cr up to 2.55 from 1.56   NE at 10   Objective   Blood pressure (!) 131/95, pulse 71, temperature (!) 97.5 F (36.4 C), temperature source Axillary, resp. rate (!) 24, height '5\' 2"'$  (1.575 m), weight 73.2 kg, SpO2 99 %.    Vent Mode: PRVC FiO2 (%):  [30 %-100 %] 30 % Set Rate:  [24 bmp] 24 bmp Vt Set:  [400 mL] 400 mL PEEP:  [5 cmH20] 5 cmH20 Plateau Pressure:  [17 cmH20-19 cmH20]  18 cmH20   Intake/Output Summary (Last 24 hours) at 10/21/2021 0935 Last data filed at 10/21/2021 2992 Gross per 24 hour  Intake 3728.85 ml  Output 625 ml  Net 3103.85 ml   Filed Weights   10/19/21 0500 10/20/21 0500 10/21/21 0500  Weight: 69.7 kg 69.9 kg 73.2 kg    Examination:  General: Critically ill middle aged F trach/vent  Neuro: Disconjugate gaze. Fixed pupils. Slight head/neck movement to painful stimuli.  HEENT: Trach secure. Dry, edematous, protuberant tongue.  CV: rrr s1s2  Pulm: Mechanically ventilated, symmetrical chest expansion  GI: soft ndnt  GU: purewick  Ext: BUE BLE edema  Skin: c/d/w    Resolved Hospital Problem list    Aspiration pneumonitis >> completed ABx 9/13, AKI from hypovolemia  Assessment & Plan:   Compromised airway due to [facility]  Tracheostomy status  -Routine trach care -wean MV as able, ultimate goal would be trach collar but this is limited by lack of spontaneous respirations -- no effort again 9/26  - goal SpO2 > 92%  H/H 5 subarachnoid hemorrhage with brainstem herniation, brain compression  Temperature dysregulation due to above  - very poor prognosis for meaningful neurologic recovery - family would like to continue supportive care -PRN bair hugger   Central Diabetes Insipidus  - dc D5 - continue free water tube flush at 200 ml q4h - q12 BMP  Hypotension  -titrate NE for MAP > 65   AKI P -trend UOP,  renal indices  - I/O protocol, may need foley replaced    Hyperglycemia P -SSI -- with dc of d5, decreasing SSI coverage    Best Practice (right click and "Reselect all SmartList Selections" daily)   Diet/type: tubefeeds  DVT prophylaxis: prophylactic heparin  GI prophylaxis: PPI Lines: Central line- RUE PICC Foley:  N/A Code Status:  full code Last date of multidisciplinary goals of care discussion: husband updated 9.26   Labs:      Latest Ref Rng & Units 10/21/2021    5:56 AM 10/20/2021    4:50 AM 10/19/2021     4:37 PM  CMP  Glucose 70 - 99 mg/dL 193  150  291   BUN 6 - 20 mg/dL 48  35  42   Creatinine 0.44 - 1.00 mg/dL 2.55  1.56  1.80   Sodium 135 - 145 mmol/L 145  158  159   Potassium 3.5 - 5.1 mmol/L 4.0  3.3  3.2   Chloride 98 - 111 mmol/L 117  123  129   CO2 22 - 32 mmol/L '23  27  25   '$ Calcium 8.9 - 10.3 mg/dL 11.0  12.0  11.3        Latest Ref Rng & Units 10/20/2021   12:51 PM 10/13/2021    3:30 AM 10/11/2021    8:55 PM  CBC  WBC 4.0 - 10.5 K/uL 11.4  11.6  9.0   Hemoglobin 12.0 - 15.0 g/dL 8.0  8.4  9.0   Hematocrit 36.0 - 46.0 % 26.4  28.3  29.2   Platelets 150 - 400 K/uL 362  146  123     ABG    Component Value Date/Time   PHART 7.409 10/01/2021 0804   PCO2ART 34.1 10/22/2021 0804   PO2ART 158 (H) 09/29/2021 0804   HCO3 21.8 10/17/2021 0804   TCO2 23 10/08/2021 0804   ACIDBASEDEF 3.0 (H) 10/17/2021 0804   O2SAT 99 10/11/2021 0804    CBG (last 3)  Recent Labs    10/20/21 2335 10/21/21 0338 10/21/21 0744  GLUCAP 104* 155* 214*    CRITICAL CARE Performed by: Cristal Generous  Total critical care time: 35 minutes  Critical care time was exclusive of separately billable procedures and treating other patients. Critical care was necessary to treat or prevent imminent or life-threatening deterioration.  Critical care was time spent personally by me on the following activities: development of treatment plan with patient and/or surrogate as well as nursing, discussions with consultants, evaluation of patient's response to treatment, examination of patient, obtaining history from patient or surrogate, ordering and performing treatments and interventions, ordering and review of laboratory studies, ordering and review of radiographic studies, pulse oximetry and re-evaluation of patient's condition.  Eliseo Gum MSN, AGACNP-BC [place] for pager  10/21/2021, 9:35 AM

## 2021-10-22 ENCOUNTER — Inpatient Hospital Stay (HOSPITAL_COMMUNITY): Payer: Medicare Other

## 2021-10-22 DIAGNOSIS — S066X9A Traumatic subarachnoid hemorrhage with loss of consciousness of unspecified duration, initial encounter: Secondary | ICD-10-CM | POA: Diagnosis not present

## 2021-10-22 DIAGNOSIS — N179 Acute kidney failure, unspecified: Secondary | ICD-10-CM

## 2021-10-22 DIAGNOSIS — A498 Other bacterial infections of unspecified site: Secondary | ICD-10-CM

## 2021-10-22 DIAGNOSIS — E875 Hyperkalemia: Secondary | ICD-10-CM

## 2021-10-22 LAB — BASIC METABOLIC PANEL
Anion gap: 10 (ref 5–15)
Anion gap: 9 (ref 5–15)
Anion gap: 7 (ref 5–15)
BUN: 70 mg/dL — ABNORMAL HIGH (ref 6–20)
BUN: 76 mg/dL — ABNORMAL HIGH (ref 6–20)
BUN: 77 mg/dL — ABNORMAL HIGH (ref 6–20)
CO2: 22 mmol/L (ref 22–32)
CO2: 22 mmol/L (ref 22–32)
CO2: 24 mmol/L (ref 22–32)
Calcium: 10.9 mg/dL — ABNORMAL HIGH (ref 8.9–10.3)
Calcium: 10.3 mg/dL (ref 8.9–10.3)
Calcium: 10.2 mg/dL (ref 8.9–10.3)
Chloride: 114 mmol/L — ABNORMAL HIGH (ref 98–111)
Chloride: 114 mmol/L — ABNORMAL HIGH (ref 98–111)
Chloride: 116 mmol/L — ABNORMAL HIGH (ref 98–111)
Creatinine, Ser: 3.46 mg/dL — ABNORMAL HIGH (ref 0.44–1.00)
Creatinine, Ser: 3.67 mg/dL — ABNORMAL HIGH (ref 0.44–1.00)
Creatinine, Ser: 3.56 mg/dL — ABNORMAL HIGH (ref 0.44–1.00)
GFR, Estimated: 16 mL/min — ABNORMAL LOW (ref >60)
GFR, Estimated: 15 mL/min — ABNORMAL LOW (ref >60)
GFR, Estimated: 15 mL/min — ABNORMAL LOW (ref >60)
Glucose, Bld: 115 mg/dL — ABNORMAL HIGH (ref 70–99)
Glucose, Bld: 210 mg/dL — ABNORMAL HIGH (ref 70–99)
Glucose, Bld: 194 mg/dL — ABNORMAL HIGH (ref 70–99)
Potassium: 6.5 mmol/L (ref 3.5–5.1)
Potassium: 4.7 mmol/L (ref 3.5–5.1)
Potassium: 4.8 mmol/L (ref 3.5–5.1)
Sodium: 146 mmol/L — ABNORMAL HIGH (ref 135–145)
Sodium: 145 mmol/L (ref 135–145)
Sodium: 147 mmol/L — ABNORMAL HIGH (ref 135–145)

## 2021-10-22 LAB — GLUCOSE, CAPILLARY
Glucose-Capillary: 114 mg/dL — ABNORMAL HIGH (ref 70–99)
Glucose-Capillary: 182 mg/dL — ABNORMAL HIGH (ref 70–99)
Glucose-Capillary: 171 mg/dL — ABNORMAL HIGH (ref 70–99)
Glucose-Capillary: 144 mg/dL — ABNORMAL HIGH (ref 70–99)
Glucose-Capillary: 153 mg/dL — ABNORMAL HIGH (ref 70–99)
Glucose-Capillary: 73 mg/dL (ref 70–99)

## 2021-10-22 LAB — CULTURE, BAL-QUANTITATIVE W GRAM STAIN: Culture: >=100000 — AB

## 2021-10-22 MED ORDER — DEXTROSE 50 % IV SOLN
1.0000 | Freq: Once | INTRAVENOUS | Status: AC
  Administered 2021-10-22: 50 mL via INTRAVENOUS
  Filled 2021-10-22: qty 50

## 2021-10-22 MED ORDER — SODIUM ZIRCONIUM CYCLOSILICATE 5 G PO PACK
5.0000 g | PACK | Freq: Once | ORAL | Status: AC
  Administered 2021-10-22: 5 g
  Filled 2021-10-22: qty 1

## 2021-10-22 MED ORDER — ALTEPLASE 2 MG IJ SOLR
2.0000 mg | Freq: Once | INTRAMUSCULAR | Status: AC
  Administered 2021-10-22: 2 mg
  Filled 2021-10-22: qty 2

## 2021-10-22 MED ORDER — LEVOFLOXACIN IN D5W 500 MG/100ML IV SOLN
500.0000 mg | INTRAVENOUS | Status: AC
  Administered 2021-10-23 – 2021-10-25 (×2): 500 mg via INTRAVENOUS
  Filled 2021-10-22 (×3): qty 100

## 2021-10-22 MED ORDER — SODIUM ZIRCONIUM CYCLOSILICATE 10 G PO PACK: 10.0000 g | PACK | Freq: Two times a day (BID) | ORAL | Status: DC

## 2021-10-22 MED ORDER — SODIUM ZIRCONIUM CYCLOSILICATE 10 G PO PACK
10.0000 g | PACK | Freq: Two times a day (BID) | ORAL | Status: DC
  Administered 2021-10-22 – 2021-10-23 (×2): 10 g
  Filled 2021-10-22 (×2): qty 1

## 2021-10-22 MED ORDER — LACTATED RINGERS IV BOLUS
500.0000 mL | Freq: Once | INTRAVENOUS | Status: AC
  Administered 2021-10-22: 500 mL via INTRAVENOUS

## 2021-10-22 MED ORDER — INSULIN ASPART 100 UNIT/ML IJ SOLN
10.0000 [IU] | Freq: Once | INTRAMUSCULAR | Status: AC
  Administered 2021-10-22: 10 [IU] via INTRAVENOUS

## 2021-10-22 MED ORDER — LACTATED RINGERS IV SOLN: INTRAVENOUS | Status: DC

## 2021-10-22 MED ORDER — SODIUM BICARBONATE 8.4 % IV SOLN
50.0000 meq | Freq: Once | INTRAVENOUS | Status: AC
  Administered 2021-10-22: 50 meq via INTRAVENOUS
  Filled 2021-10-22: qty 50

## 2021-10-22 NOTE — Progress Notes (Signed)
eLink Physician-Brief Progress Note Patient Name: [name] DOB: December 29, 1975 MRN: [id number]   Date of Service  10/22/2021  HPI/Events of Note  RN reports after turning patient at beginning of shift. Worsening hypoxemia and now needing 100%. Levophed 14>25.  Vent reviewed. FIO2 100% PEEP increased from 5>10 Peak pressure 29  eICU Interventions  STAT EKG, CXR, trop and BNP ordered LR bolus 500cc. However overloaded on exam per RN with CFB +11L since admission   12:40 AM EKG with sinus tach, no ST changes-TWI. Labs normal. CXR with unchanged right infiltrate. Transient response to bolus however decreased SBP to 90s again on levophed. Add vasopressin. On levofloxacin for known Steno maltophilia pneumonia. Consider central process vs infectious. With declining status will repeat blood cultures, obtain LA and broaden antibiotics. Updated family at bedisde  Intervention Category Major Interventions: Hypotension - evaluation and management  [name] Rodman Pickle 10/22/2021, 11:10 PM

## 2021-10-22 NOTE — Progress Notes (Addendum)
NAME:  [name], MRN:  [id number], DOB:  1975/07/21, LOS: 27 ADMISSION DATE:  09/26/2021, CONSULTATION DATE: 10/23/2021 REFERRING MD: ED physician, CHIEF COMPLAINT: Subarachnoid hemorrhage  History of Present Illness:  46 yo female former smoker was found unresponsive by her husband and EMS called.  She was making gurgling noises with her breathing.  She was found to have SVT and EMS performed DCCV.  She had GCS 4 in ER and intubated for airway protection.  Found to have large SAH and neurosurgery arranged for hospital admission.  PCCM consulted to assist with management in ICU.  Pertinent  Medical History  Hypertension, uterine fibroids Legally blind in left eye  Significant Hospital Events: Including procedures, antibiotic start and stop dates in addition to other pertinent events   9/07 presented unresponsive, hypotensive; CT head with subarachnoid hemorrhage 9/11 DDAVP give for Central DI, repeat head CT with worsening infarcts and herniation 9/12 Attempted transfer for second opinion at [facility] and [facility].  No beds at [facility] and transfer was declined at [facility] 9/13 no change in mental status.  Transfer to [facility] declined 9/14 [facility] declined transfer again.  No change.  DDAVP x 1 9/18 palliative care consulted 9/23 Na increased >> restart DDAVP and increase free water 9/25 plan for trach. Increased pressor requirement. Added midodrine  9/26 dc d5W. Weaning NE as able. On I/O cath protocol  9/27 worse renal fxn and new hyperkalemia   Interim History / Subjective:   Worse renal fxn today, with Cr up to 3.46 as well as new hyperkalemia Na 6.5 Received insulin D50, 1 amp bicarb and lokelma   Objective   Blood pressure (!) 142/90, pulse 91, temperature (!) 96.8 F (36 C), temperature source Axillary, resp. rate (!) 24, height '5\' 2"'$  (1.575 m), weight 73.2 kg, SpO2 90 %.    Vent Mode: PRVC FiO2 (%):  [30 %] 30 % Set Rate:  [24 bmp] 24 bmp Vt Set:  [400 mL] 400 mL PEEP:  [5  cmH20] 5 cmH20 Plateau Pressure:  [18 cmH20] 18 cmH20   Intake/Output Summary (Last 24 hours) at 10/22/2021 0951 Last data filed at 10/22/2021 0930 Gross per 24 hour  Intake 2382.81 ml  Output 825 ml  Net 1557.81 ml   Filed Weights   10/19/21 0500 10/20/21 0500 10/21/21 0500  Weight: 69.7 kg 69.9 kg 73.2 kg    Examination:  General: critically ill adult F trach/vent  Neuro: disconjugate gaze. No spontaneous respiration, no corneal, gag, cough. Slight head/neck movement to pain. Fixed dilated pupils HEENT: Trach secure. Protuberant dry tongue  CV: rrr s1s2  Pulm: Diminished basilar sounds, some scattered rhonchi. Mechanically ventilated GI: soft ndnt + stool collection device  GU: no foley. Dark amber urine  Ext: anasarca. No acute joint deformity  Skin: friable tissue. Clean, dry.   Resolved Hospital Problem list    Aspiration pneumonitis >> completed ABx 9/13, AKI from hypovolemia  Assessment & Plan:   Compromised airway due to [facility] Tracheostomy status VDRF  RLL Stenotrophomonas Maltophilia PNA -Routine trach care -wean MV as able, ultimate goal would be trach collar but this is limited by lack of spontaneous respirations -- no effort again 9/26  - goal SpO2 > 92% -levaquin (no bactrim with renal fxn)   HH 5 SAH with brainstem herniation, brain compression Temperature dysregulation due to above  - very poor prognosis for meaningful neurologic recovery - family would like to continue supportive care -PRN bair hugger   AKI -- ?ATN  Hyperkalemia P -replace foley -nephro consult -5103m LR bolus + mIVF  -repeat BMP pending after K temporizing measures, then BID   Central DI Hypernatremia  - continue free water tube flush at 200 ml q4h - q12 BMP  Hypotension  -related to neuro process vs infection  -titrate NE for MAP > 65  -LR as above     Hyperglycemia P -SSI   Best Practice (right click and "Reselect all SmartList Selections" daily)   Diet/type:  tubefeeds  DVT prophylaxis: prophylactic heparin  GI prophylaxis: PPI Lines: Central line- RUE PICC Foley:  N/A Code Status:  full code Last date of multidisciplinary goals of care discussion: husband updated 9/27  Labs:      Latest Ref Rng & Units 10/22/2021    1:51 AM 10/21/2021    4:52 PM 10/21/2021    5:56 AM  CMP  Glucose 70 - 99 mg/dL 115  172  193   BUN 6 - 20 mg/dL 70  59  48   Creatinine 0.44 - 1.00 mg/dL 3.46  2.87  2.55   Sodium 135 - 145 mmol/L 146  147  145   Potassium 3.5 - 5.1 mmol/L 6.5  6.1  4.0   Chloride 98 - 111 mmol/L 114  115  117   CO2 22 - 32 mmol/L '22  24  23   '$ Calcium 8.9 - 10.3 mg/dL 10.9  11.1  11.0        Latest Ref Rng & Units 10/20/2021   12:51 PM 10/13/2021    3:30 AM 10/11/2021    8:55 PM  CBC  WBC 4.0 - 10.5 K/uL 11.4  11.6  9.0   Hemoglobin 12.0 - 15.0 g/dL 8.0  8.4  9.0   Hematocrit 36.0 - 46.0 % 26.4  28.3  29.2   Platelets 150 - 400 K/uL 362  146  123     ABG    Component Value Date/Time   PHART 7.409 09/30/2021 0804   PCO2ART 34.1 10/20/2021 0804   PO2ART 158 (H) 10/05/2021 0804   HCO3 21.8 10/05/2021 0804   TCO2 23 10/16/2021 0804   ACIDBASEDEF 3.0 (H) 10/01/2021 0804   O2SAT 99 10/12/2021 0804    CBG (last 3)  Recent Labs    10/21/21 2318 10/22/21 0324 10/22/21 0736  GLUCAP 113* 114* 182*    CRITICAL CARE Performed by: GCristal Generous  Total critical care time: 38 minutes  Critical care time was exclusive of separately billable procedures and treating other patients. Critical care was necessary to treat or prevent imminent or life-threatening deterioration.  Critical care was time spent personally by me on the following activities: development of treatment plan with patient and/or surrogate as well as nursing, discussions with consultants, evaluation of patient's response to treatment, examination of patient, obtaining history from patient or surrogate, ordering and performing treatments and interventions, ordering  and review of laboratory studies, ordering and review of radiographic studies, pulse oximetry and re-evaluation of patient's condition.  GEliseo GumMSN, AGACNP-BC [place] pager  10/22/2021, 9:51 AM

## 2021-10-22 NOTE — Progress Notes (Signed)
Patient ID: [name], female   DOB: 05-Jan-1976, 46 y.o.   MRN: [id number] BP (!) 100/58   Pulse 86   Temp 97.7 F (36.5 C) (Oral)   Resp (!) 22   Ht '5\' 2"'$  (1.575 m)   Wt 73.2 kg   SpO2 91%   BMI 29.52 kg/m  Comatose.  Kidney function is compromised, nephrology consulted and have left recommendations. No neurological change IR waiting for creatinine to be normal or near normal before placing peg

## 2021-10-22 NOTE — Progress Notes (Signed)
Routine trach care provided to patient during morning round. Pt trialed on SBT CPAP/PS 8/5 without any pt effort. Pt returned to full support settings. RT will continue to be available as needed.

## 2021-10-22 NOTE — Progress Notes (Signed)
eLink Physician-Brief Progress Note Patient Name: [name] DOB: 16-May-1975 MRN: [id number]   Date of Service  10/22/2021  HPI/Events of Note  45 year old woman who remains comatose following severe SAH. Likely with brain death, family declined to test.  Ethics consultation completed. S/p trach.    Continues to have hypernatremia from DI   Hyperkalemia from AKI, probable cell breakdown, ? Ischemic tissue   eICU Interventions  - D50 / Insulin/ 1 amp Na Bicarb ordered + Lokelma  - f/u AM labs         Kayton Dunaj N Derric Dealmeida 10/22/2021, 3:30 AM

## 2021-10-22 NOTE — Consult Note (Signed)
[name] Admit Date: 10/12/2021 10/22/2021 Rexene Agent Requesting Physician:  Lynetta Mare MD  Reason for Consult:  AKI, Hyperkalemia HPI:  35F presented to the ED 9/7 after being found unresponsive at home.  She was found to have large subarachnoid hemorrhage.  Not a surgical candidate.  She has had minimal neurological function, perhaps brain death but this has not been formally tested at the request of family.  Course complicated by VDRF requiring tracheostomy; central DI requiring free water and desmopressin; stenotrophomonas pneumonia.  Recent trend in serum creatinine as below presenting creatinine of 0.6.  Serum sodium has normalized.  Potassium earlier today was 6.5 with a serum bicarbonate of 22.  Urine output had dropped off, Foley catheter replaced earlier today.  No recent contrast exposures.  Has had persistent hypotension requiring norepinephrine.  Receiving levofloxacin.    Creatinine, Ser (mg/dL)  Date Value  10/22/2021 3.67 (H)  10/22/2021 3.46 (H)  10/21/2021 2.87 (H)  10/21/2021 2.55 (H)  10/20/2021 1.56 (H)  10/19/2021 1.80 (H)  10/19/2021 1.73 (H)  10/18/2021 1.85 (H)  10/18/2021 1.85 (H)  10/18/2021 1.89 (H)  ] I/Os: I/O last 3 completed shifts: In: 5198.6 [I.V.:1431.9; NG/GT:3616.7; IV Piggyback:150] Out: 43 [Urine:900; Stool:50]   ROS NSAIDS: No exposure IV Contrast no exposure TMP/SMX exposure Hypotension persistent hypotension/shock Balance of 12 systems is negative w/ exceptions as above  PMH  Past Medical History:  Diagnosis Date   Blind left eye    Fibroid    Hypertension    Vaginal Pap smear, abnormal    Visual loss, right eye    Vitamin D deficiency    PSH  Past Surgical History:  Procedure Laterality Date   cryotheraphy     EYE SURGERY     FH  Family History  Problem Relation Age of Onset   Hypertension Mother    Hypertension Father    Heart failure Father    Breast cancer Paternal Aunt    SH  reports that she has  quit smoking. Her smoking use included cigarettes. She smoked an average of .25 packs per day. She has quit using smokeless tobacco. She reports that she does not drink alcohol and does not use drugs. Allergies  Allergies  Allergen Reactions   Penicillins Hives    Tolerated Unasyn during 09/2021 admission    Home medications Prior to Admission medications   Medication Sig Start Date End Date Taking? Authorizing Provider  albuterol (PROVENTIL HFA;VENTOLIN HFA) 108 (90 BASE) MCG/ACT inhaler Inhale 1-2 puffs into the lungs every 6 (six) hours as needed for wheezing or shortness of breath. 03/21/14   Fransico Meadow, PA-C  atorvastatin (LIPITOR) 20 MG tablet Take 20 mg by mouth daily at 6 PM.     [provider]  Cholecalciferol (VITAMIN D PO) Take 1 tablet by mouth daily.    [provider]  Cyanocobalamin (B-12) 2500 MCG TABS Take 2,500 mcg by mouth daily.    [provider]  dicyclomine (BENTYL) 20 MG tablet Take 1 tablet (20 mg total) by mouth 4 (four) times daily -  before meals and at bedtime for 5 days. 01/12/17 01/17/17  Duffy Bruce, MD  ibuprofen (ADVIL,MOTRIN) 200 MG tablet Take 200 mg by mouth 2 (two) times daily as needed for mild pain.    [provider]  ibuprofen (ADVIL,MOTRIN) 800 MG tablet Take 1 tablet (800 mg total) by mouth 3 (three) times daily. 06/20/16   Keitha Butte, CNM  loperamide (IMODIUM) 2 MG capsule  Take 1 capsule (2 mg total) by mouth 4 (four) times daily as needed for diarrhea or loose stools. 01/12/17   Duffy Bruce, MD  megestrol (MEGACE) 40 MG tablet Take 1 tablet (40 mg total) by mouth 3 (three) times daily. Patient not taking: Reported on 07/31/2016 06/20/16   Keitha Butte, CNM  ondansetron (ZOFRAN ODT) 4 MG disintegrating tablet Take 1 tablet (4 mg total) by mouth every 8 (eight) hours as needed for nausea or vomiting. 01/12/17   Duffy Bruce, MD  PATADAY 0.2 % SOLN Place 1 drop into both eyes daily.  12/30/14    [provider]  valsartan-hydrochlorothiazide (DIOVAN-HCT) 160-25 MG per tablet Take 1 tablet by mouth daily.  03/19/13   [provider]  VITAMIN E PO Take 1 tablet by mouth daily.    [provider]    Current Medications Scheduled Meds:  alteplase  2 mg Intracatheter Once   alteplase  2 mg Intracatheter Once   Chlorhexidine Gluconate Cloth  6 each Topical Daily   feeding supplement (PROSource TF20)  60 mL Per Tube Daily   free water  200 mL Per Tube Q4H   heparin injection (subcutaneous)  5,000 Units Subcutaneous Q8H   insulin aspart  0-15 Units Subcutaneous Q4H   midodrine  10 mg Per Tube Q8H   mouth rinse  15 mL Mouth Rinse Q2H   pantoprazole  40 mg Per Tube Daily   polyethylene glycol  17 g Per Tube Daily   senna-docusate  1 tablet Per Tube BID   silver sulfADIAZINE   Topical Daily   sodium chloride flush  10-40 mL Intracatheter Q12H   sodium zirconium cyclosilicate  10 g Per Tube BID   Continuous Infusions:  feeding supplement (OSMOLITE 1.5 CAL) 50 mL/hr at 10/22/21 0600   lactated ringers 50 mL/hr at 10/22/21 1029   [START ON 10/23/2021] levofloxacin (LEVAQUIN) IV     norepinephrine (LEVOPHED) Adult infusion 5 mcg/min (10/22/21 0600)   PRN Meds:.acetaminophen **OR** acetaminophen (TYLENOL) oral liquid 160 mg/5 mL **OR** acetaminophen, fentaNYL (SUBLIMAZE) injection, sodium chloride flush  CBC Recent Labs  Lab 10/20/21 1251  WBC 11.4*  HGB 8.0*  HCT 26.4*  MCV 99.2  PLT 998   Basic Metabolic Panel Recent Labs  Lab 10/19/21 0406 10/19/21 1637 10/20/21 0450 10/21/21 0556 10/21/21 1652 10/22/21 0151 10/22/21 1015  NA 161* 159* 158* 145 147* 146* 145  K 3.6 3.2* 3.3* 4.0 6.1* 6.5* 4.7  CL >130* 129* 123* 117* 115* 114* 114*  CO2 '25 25 27 23 24 22 22  '$ GLUCOSE 340* 291* 150* 193* 172* 115* 210*  BUN 41* 42* 35* 48* 59* 70* 76*  CREATININE 1.73* 1.80* 1.56* 2.55* 2.87* 3.46* 3.67*  CALCIUM 11.3* 11.3* 12.0* 11.0* 11.1* 10.9* 10.3     Physical Exam   Blood pressure (!) 89/55, pulse 81, temperature (!) 96.8 F (36 C), temperature source Axillary, resp. rate (!) 24, height '5\' 2"'$  (1.575 m), weight 73.2 kg, SpO2 92 %. GEN: Lying in bed, not ENT: Tracheostomy present with protruding tongue CV: Regular PULM: Coarse breath sounds bilaterally ABD: Soft EXT: 2-3+ edema present  Assessment 47F severe SAH with severe persistent neurological injury, central DI, now with AKI  AKI:  Normal baselien SCr DDX obstruction, ATN, hypovolemia (but doubt) Renal US, agree with foley LR bolus by CCM Not a candidate for any form of RRT given #3 Hyperkalemia: moderate, s/p lokelma, trend SAH with severe neurological injury, perhaps brain death Cetnral DI, improved, per  CCM Hypercalcemia, ? Immobility  Plan Renal US, hydration Trend labs Husband at bedside.  Updated him about current status.  Informed that there is no immediate indication for dialysis and hopefully will come to that.  Also informed him that given severe irreversible neurological injury the patient would not be a candidate for dialysis in the future.  He expressed understanding. Daily weights, Daily Renal Panel, Strict I/Os, Avoid nephrotoxins (NSAIDs, judicious IV Contrast)    Rexene Agent  10/22/2021, 11:56 AM

## 2021-10-23 DIAGNOSIS — Z515 Encounter for palliative care: Secondary | ICD-10-CM | POA: Diagnosis not present

## 2021-10-23 DIAGNOSIS — I609 Nontraumatic subarachnoid hemorrhage, unspecified: Secondary | ICD-10-CM | POA: Diagnosis not present

## 2021-10-23 DIAGNOSIS — R4182 Altered mental status, unspecified: Secondary | ICD-10-CM

## 2021-10-23 DIAGNOSIS — A498 Other bacterial infections of unspecified site: Secondary | ICD-10-CM

## 2021-10-23 DIAGNOSIS — J9611 Chronic respiratory failure with hypoxia: Secondary | ICD-10-CM

## 2021-10-23 DIAGNOSIS — E875 Hyperkalemia: Secondary | ICD-10-CM

## 2021-10-23 DIAGNOSIS — Z7189 Other specified counseling: Secondary | ICD-10-CM | POA: Diagnosis not present

## 2021-10-23 DIAGNOSIS — E876 Hypokalemia: Secondary | ICD-10-CM

## 2021-10-23 DIAGNOSIS — S066X9A Traumatic subarachnoid hemorrhage with loss of consciousness of unspecified duration, initial encounter: Secondary | ICD-10-CM | POA: Diagnosis not present

## 2021-10-23 LAB — LACTIC ACID, PLASMA
Lactic Acid, Venous: 1.6 mmol/L (ref 0.5–1.9)
Lactic Acid, Venous: 1.6 mmol/L (ref 0.5–1.9)

## 2021-10-23 LAB — BASIC METABOLIC PANEL
Anion gap: 15 (ref 5–15)
Anion gap: 6 (ref 5–15)
BUN: 71 mg/dL — ABNORMAL HIGH (ref 6–20)
BUN: 71 mg/dL — ABNORMAL HIGH (ref 6–20)
CO2: 21 mmol/L — ABNORMAL LOW (ref 22–32)
CO2: 21 mmol/L — ABNORMAL LOW (ref 22–32)
Calcium: 10.8 mg/dL — ABNORMAL HIGH (ref 8.9–10.3)
Calcium: 9 mg/dL (ref 8.9–10.3)
Chloride: 112 mmol/L — ABNORMAL HIGH (ref 98–111)
Chloride: 115 mmol/L — ABNORMAL HIGH (ref 98–111)
Creatinine, Ser: 3.58 mg/dL — ABNORMAL HIGH (ref 0.44–1.00)
Creatinine, Ser: 3.41 mg/dL — ABNORMAL HIGH (ref 0.44–1.00)
GFR, Estimated: 15 mL/min — ABNORMAL LOW (ref >60)
GFR, Estimated: 16 mL/min — ABNORMAL LOW (ref >60)
Glucose, Bld: 133 mg/dL — ABNORMAL HIGH (ref 70–99)
Glucose, Bld: 230 mg/dL — ABNORMAL HIGH (ref 70–99)
Potassium: 4.1 mmol/L (ref 3.5–5.1)
Potassium: 3.4 mmol/L — ABNORMAL LOW (ref 3.5–5.1)
Sodium: 148 mmol/L — ABNORMAL HIGH (ref 135–145)
Sodium: 142 mmol/L (ref 135–145)

## 2021-10-23 LAB — GLUCOSE, CAPILLARY
Glucose-Capillary: 121 mg/dL — ABNORMAL HIGH (ref 70–99)
Glucose-Capillary: 155 mg/dL — ABNORMAL HIGH (ref 70–99)
Glucose-Capillary: 164 mg/dL — ABNORMAL HIGH (ref 70–99)
Glucose-Capillary: 215 mg/dL — ABNORMAL HIGH (ref 70–99)
Glucose-Capillary: 231 mg/dL — ABNORMAL HIGH (ref 70–99)
Glucose-Capillary: 246 mg/dL — ABNORMAL HIGH (ref 70–99)

## 2021-10-23 LAB — BRAIN NATRIURETIC PEPTIDE: B Natriuretic Peptide: 27.9 pg/mL (ref 0.0–100.0)

## 2021-10-23 LAB — TROPONIN I (HIGH SENSITIVITY)
Troponin I (High Sensitivity): 10 ng/L (ref <18)
Troponin I (High Sensitivity): 11 ng/L (ref <18)

## 2021-10-23 MED ORDER — VANCOMYCIN VARIABLE DOSE PER UNSTABLE RENAL FUNCTION (PHARMACIST DOSING): Status: DC

## 2021-10-23 MED ORDER — VANCOMYCIN HCL 1500 MG/300ML IV SOLN
1500.0000 mg | Freq: Once | INTRAVENOUS | Status: AC
  Administered 2021-10-23: 1500 mg via INTRAVENOUS
  Filled 2021-10-23: qty 300

## 2021-10-23 MED ORDER — SODIUM CHLORIDE 0.9 % IV SOLN
2.0000 g | INTRAVENOUS | Status: DC
  Administered 2021-10-23: 2 g via INTRAVENOUS
  Filled 2021-10-23: qty 12.5

## 2021-10-23 MED ORDER — VASOPRESSIN 20 UNITS/100 ML INFUSION FOR SHOCK
0.0000 [IU]/min | INTRAVENOUS | Status: DC
  Administered 2021-10-23 – 2021-10-24 (×4): 0.03 [IU]/min via INTRAVENOUS
  Filled 2021-10-23 (×3): qty 100

## 2021-10-23 NOTE — Progress Notes (Signed)
Pharmacy Antibiotic Note  [name] is a 46 y.o. female admitted on 10/24/2021 with [facility] now with prolonged hospitalization and ventilator dependence.  Pharmacy has been consulted for Vancomycin dosing. Patient is already on Levaquin for stenotrophomonas VAP.  Plan: Received vancomycin '1500mg'$  9/28 '@0300'$ .  Due to AKI, will check random level 9/29 with AM labs Continue Levaquin '500mg'$  Q48H   Height: '5\' 2"'$  (157.5 cm) Weight: 74.1 kg (163 lb 5.8 oz) IBW/kg (Calculated) : 50.1  Temp (24hrs), Avg:98.3 F (36.8 C), Min:96.8 F (36 C), Max:99.7 F (37.6 C)  Recent Labs  Lab 10/20/21 1251 10/21/21 0556 10/21/21 1652 10/22/21 0151 10/22/21 1015 10/22/21 1641 10/23/21 0122 10/23/21 0406  WBC 11.4*  --   --   --   --   --   --   --   CREATININE  --    < > 2.87* 3.46* 3.67* 3.56*  --  3.58*  LATICACIDVEN  --   --   --   --   --   --  1.6 1.6   < > = values in this interval not displayed.     Estimated Creatinine Clearance: 18.3 mL/min (A) (by C-G formula based on SCr of 3.58 mg/dL (H)).    Allergies  Allergen Reactions   Penicillins Hives    Tolerated Unasyn during 09/2021 admission     Erskine Speed, PharmD Clinical Pharmacist Phone: [phone]

## 2021-10-23 NOTE — Progress Notes (Signed)
Admit: 10/24/2021 LOS: 21  68F severe SAH with severe persistent neurological injury, central DI, now with AKI  Subjective:  3.8L UOP after foley replaced SCr stable Hypotensive overnight  on NE and VP Renal US had b/l moderate HN and bladder distension K 4.1 Husband and Daughter in room, updated on status.  Again let them know she does not req RRT, and I think she will not. If however she does, she is not a candidate and they expressed understanding and no objection  09/27 0701 - 09/28 0700 In: 4502.5 [I.V.:1295; NG/GT:1799.3; IV Piggyback:1408.2] Out: 4450 [Urine:3750; Stool:700]  Filed Weights   10/20/21 0500 10/21/21 0500 10/23/21 0500  Weight: 69.9 kg 73.2 kg 74.1 kg    Scheduled Meds:  Chlorhexidine Gluconate Cloth  6 each Topical Daily   feeding supplement (PROSource TF20)  60 mL Per Tube Daily   free water  200 mL Per Tube Q4H   heparin injection (subcutaneous)  5,000 Units Subcutaneous Q8H   insulin aspart  0-15 Units Subcutaneous Q4H   midodrine  10 mg Per Tube Q8H   mouth rinse  15 mL Mouth Rinse Q2H   pantoprazole  40 mg Per Tube Daily   polyethylene glycol  17 g Per Tube Daily   senna-docusate  1 tablet Per Tube BID   silver sulfADIAZINE   Topical Daily   sodium chloride flush  10-40 mL Intracatheter Q12H   sodium zirconium cyclosilicate  10 g Per Tube BID   vancomycin variable dose per unstable renal function (pharmacist dosing)   Does not apply See admin instructions   Continuous Infusions:  ceFEPime (MAXIPIME) IV Stopped (10/23/21 0229)   feeding supplement (OSMOLITE 1.5 CAL) 50 mL/hr at 10/23/21 0600   lactated ringers 50 mL/hr at 10/23/21 0600   levofloxacin (LEVAQUIN) IV     norepinephrine (LEVOPHED) Adult infusion 13 mcg/min (10/23/21 0911)   vasopressin 0.03 Units/min (10/23/21 0912)   PRN Meds:.acetaminophen **OR** acetaminophen (TYLENOL) oral liquid 160 mg/5 mL **OR** acetaminophen, fentaNYL (SUBLIMAZE) injection, sodium chloride flush  Current  Labs: reviewed    Physical Exam:  Blood pressure (!) 82/52, pulse 87, temperature 98.1 F (36.7 C), temperature source Axillary, resp. rate (!) 24, height '5\' 2"'$  (1.575 m), weight 74.1 kg, SpO2 96 %. GEN: Lying in bed, not ENT: Tracheostomy present with protruding tongue CV: Regular PULM: Coarse breath sounds bilaterally ABD: Soft EXT: 2-3+ edema present GU Foley in  A AKI 2/2 BOO:      Normal baselien SCr BOO ? Central process Great UOP after foley placed Can cont solute / hydration with enteral route Not a candidate for any form of RRT given #3 Hyperkalemia: resolved 2/2 #1, doesn't need [facility] with severe neurological injury, perhaps brain death Cetnral DI, improved, per CCM Hypercalcemia, ? Immobility  P Cont foley catheter, do not remove Will trend SCr, if doesn't sig improve tomorrow rpt renal US as the bladder wasn't decompressed with foley in Daily weights, Daily Renal Panel, Strict I/Os, Avoid nephrotoxins (NSAIDs, judicious IV Contrast)    Pearson Grippe MD 10/23/2021, 10:42 AM  Recent Labs  Lab 10/22/21 1015 10/22/21 1641 10/23/21 0406  NA 145 147* 148*  K 4.7 4.8 4.1  CL 114* 116* 112*  CO2 22 24 21*  GLUCOSE 210* 194* 133*  BUN 76* 77* 71*  CREATININE 3.67* 3.56* 3.58*  CALCIUM 10.3 10.2 10.8*   Recent Labs  Lab 10/20/21 1251  WBC 11.4*  HGB 8.0*  HCT 26.4*  MCV 99.2  PLT 362

## 2021-10-23 NOTE — Progress Notes (Signed)
Daily Progress Note   Patient Name: [name]       Date: 10/23/2021 DOB: [birth year]  Age: 46 y.o. MRN#: [id number] Attending Physician: Ashok Pall, MD Primary Care Physician: Glendon Axe, MD Admit Date: 10/24/2021 Length of Stay: 21 days  Reason for Consultation/Follow-up: Establishing goals of care and Psychosocial/spiritual support  HPI/Patient Profile:  46 year old female with hypertension who presented to the emergency room after being found unresponsive by her husband.  Patient was last seen at her baseline around 6 AM, patient's husband noticed that she was "breathing funny" and making gurgling noises.  He attempted to wake her and she was unresponsive, 22 year old daughter called EMS.  He started CPR and upon EMS arrival the patient was in SVT and they performed DCCV.  Heart rate improved.  There was not improvement in her mental status.   PMT was consulted for [place] conversations.  Subjective:   Subjective: Chart Reviewed. Updates received. Patient Assessed. Created space and opportunity for patient  and family to explore thoughts and feelings regarding current medical situation.  Today's Discussion: Today met with the patient's son and daughter at the bedside.  PCCM was also present. PCCM relayed that they do not anticipate the patient will make significant progress neurologically.  They note that she has spinal reflexes.  Typically they would expect improvement within the first week.  The daughter noted that other people have awakened from, is longer than 1 week.  The PCCM physician agreed NSAID but these are usually patients with normal imaging and other different circumstances then [name].  After PCCM left I asked the patient and family understanding [name] if there are any questions.  The daughter seemed to be aggravated by the persistent negativity.  She states that she has seen progression since the last meeting.  When asked specifically for progression she notes  movements, lower body reflexes and she feels that she can feel things in her lower body.  She also mentioned the "5 spontaneous breaths" that she has had.  She notes that she was off levo (even though she is back on levo and is having increased dose requirements).  She stated "just because she had 1 bad day you can ignore significant progress has been made thus far.  She also notes that the catheter is back in place and nephrology told them that they hope her creatinine will improve with this.  I also made sure that they understood, per my discussion with nephrologist, that they do not plan to offer hemodialysis if her creatinine does not improve.  At this time they want to continue aggressive care and full scope of care.  I continue to offered ongoing support with palliative medicine.  I discussed plan for continued check in with family 1-2 times a week. I offered that they can always call our service for any questions or concerns.  An update was received from the nurse and he indicated levophed increased to 25 last night, added vasopressin. Continued only spinal reflexes.  I provided emotional and general support through therapeutic listening, empathy, sharing of stories, and other techniques. I answered all questions and addressed all concerns to the best of my ability.  Review of Systems  Unable to perform ROS: Intubated    Objective:   Vital Signs:  BP 100/67 (BP Location: Left Arm)   Pulse 75   Temp 98.2 F (36.8 C) (Axillary)   Resp (!) 24   Ht 5' 2"  (1.575 m)   Wt 74.1 kg  SpO2 100%   BMI 29.88 kg/m   Physical Exam: Physical Exam Vitals and nursing note reviewed.  Constitutional:      General: She is not in acute distress.    Appearance: She is obese. She is ill-appearing.     Interventions: She is intubated.  HENT:     Head: Normocephalic and atraumatic.     Mouth/Throat:     Comments: Noted tongue swelling Trach in place and ventilator attached Cardiovascular:      Rate and Rhythm: Normal rate.  Pulmonary:     Effort: No respiratory distress. She is intubated.     Comments: Intubated and on full support Abdominal:     General: Abdomen is flat.     Palpations: Abdomen is soft.  Neurological:     Mental Status: She is unresponsive.     Palliative Assessment/Data: 10% (NG Tube for tube feeding)   Assessment & Plan:   Impression: Present on Admission:  Subarachnoid hematoma ([place])  46 year old female with catastrophic subarachnoid hemorrhage and apparent impending herniation.  She is unresponsive at this time.  Neurosurgery is following.  Family seems to be grieving substantially and having difficulty excepting the patient's current condition.  They have requested transfer for second opinion but all tertiary care centers in the area have declined to accept her.  Family is currently against brain death testing, also deemed not necessary at recent ethics meeting.  Family currently in line with a full code, full scope.  They are requesting time and hoping for a miracle.  It has been communicated to them the grave situation she is in.  Given the degree of her injury and ramifications subsequent to, overall prognosis is grave.  SUMMARY OF RECOMMENDATIONS   Full code, full scope at this time Continue support of patient and family Plan to meet with family and discuss updates and continue support once a week (or more if needed) PMT will continue to follow  Symptom Management:  Per primary team PMT is available to assist as needed  Code Status: Full code  Prognosis: Unable to determine  Discharge Planning: To Be Determined  Discussed with: Patient's family, medical team, nursing team  Thank you for allowing [place] to participate in the care of [name] PMT will continue to support holistically.  Billing based on MDM: High  Problems Addressed: One acute or chronic illness or injury that poses a threat to life or bodily function  Amount  and/or Complexity of Data: Category 3:Discussion of management or test interpretation with external physician/other qualified health care professional/appropriate source (not separately reported)  Risks: N/A   Walden Field, NP Palliative Medicine Team  Team Phone # [phone] (Nights/Weekends)  09/24/2020, 8:17 AM

## 2021-10-23 NOTE — Progress Notes (Signed)
Patient ID: [name], female   DOB: [birth year], 46 y.o.   MRN: [id number] BP 115/69   Pulse 75   Temp 98.1 F (36.7 C) (Axillary)   Resp (!) 24   Ht '5\' 2"'$  (1.575 m)   Wt 74.1 kg   SpO2 100%   BMI 29.88 kg/m  Comatose. No pupillary reaction, corneals, or oculocephalics. There is no cough, gag. Prognosis is grave.  Kidney function is deteriorating unfortunately.  No PEG as long as creatinine is elevated. No possibility for dialysis.

## 2021-10-23 NOTE — Progress Notes (Signed)
NAME:  GERYL DOHN, MRN:  [id number], DOB:  February 06, 1975, LOS: 21 ADMISSION DATE:  09/28/2021, CONSULTATION DATE: 10/23/2021 REFERRING MD: ED physician, CHIEF COMPLAINT: Subarachnoid hemorrhage  History of Present Illness:  46 yo female former smoker was found unresponsive by her husband and EMS called.  She was making gurgling noises with her breathing.  She was found to have SVT and EMS performed DCCV.  She had GCS 4 in ER and intubated for airway protection.  Found to have large SAH and neurosurgery arranged for hospital admission.  PCCM consulted to assist with management in ICU.  Pertinent  Medical History  Hypertension, uterine fibroids Legally blind in left eye  Significant Hospital Events: Including procedures, antibiotic start and stop dates in addition to other pertinent events   9/07 presented unresponsive, hypotensive; CT head with subarachnoid hemorrhage 9/11 DDAVP give for Central DI, repeat head CT with worsening infarcts and herniation 9/12 Attempted transfer for second opinion at [facility] and [facility].  No beds at [facility] and transfer was declined at [facility] 9/13 no change in mental status.  Transfer to [facility] declined 9/14 [facility] declined transfer again.  No change.  DDAVP x 1 9/18 palliative care consulted 9/23 Na increased >> restart DDAVP and increase free water 9/25 plan for trach. Increased pressor requirement. Added midodrine  9/26 dc d5W. Weaning NE as able. On I/O cath protocol  9/27 worse renal fxn and new hyperkalemia   Interim History / Subjective:   No change    Objective   Blood pressure (Abnormal) 82/52, pulse 87, temperature 98.1 F (36.7 C), temperature source Axillary, resp. rate (Abnormal) 24, height '5\' 2"'$  (1.575 m), weight 74.1 kg, SpO2 96 %.    Vent Mode: PRVC FiO2 (%):  [30 %-100 %] 60 % Set Rate:  [24 bmp] 24 bmp Vt Set:  [400 mL] 400 mL PEEP:  [5 cmH20-10 cmH20] 10 cmH20 Plateau Pressure:  [18 cmH20-24 cmH20] 22 cmH20   Intake/Output Summary  (Last 24 hours) at 10/23/2021 1047 Last data filed at 10/23/2021 0800 Gross per 24 hour  Intake 4602.53 ml  Output 4375 ml  Net 227.53 ml   Filed Weights   10/20/21 0500 10/21/21 0500 10/23/21 0500  Weight: 69.9 kg 73.2 kg 74.1 kg    Examination:  General unresponsive 46 year old female  HENT tongue swollen and protruding from mouth. Trach midline. Pupils NR Pulm clear. Dec bases. No spont efforts Card rrr on high dose pressors Abd soft Gu cnc yellow urine  Neuro GCS 3   Resolved Hospital Problem list    Aspiration pneumonitis >> completed ABx 9/13, AKI from hypovolemia  Assessment & Plan:   Compromised airway due to [facility] Tracheostomy status VDRF  RLL Stenotrophomonas Maltophilia PNA Plan Cont full vent support  VAP bundle Routine trach care Day 2 cefepime and vanc   HH 5 SAH with brainstem herniation, brain compression Temperature dysregulation due to above  - very poor prognosis for meaningful neurologic recovery - family would like to continue supportive care Plan Supportive care  AKI -- ?ATN Renal fxn worse. Not candidate for HD Plan Cont IVFs Avoid hypotension if able  Nothing else to offer   Central DI Hypernatremia  Plan Cont free water and q12 bmp  Hypotension  -related to neuro process vs infection  Plan Cont NE and vasopressin (added last evening)  Nothing more to add.  MAP goal > 65 if able     Hyperglycemia Plan Ssi    Best Practice (  right click and "Reselect all SmartList Selections" daily)   Diet/type: tubefeeds  DVT prophylaxis: prophylactic heparin  GI prophylaxis: PPI Lines: Central line- RUE PICC Foley:  N/A Code Status:  full code Last date of multidisciplinary goals of care discussion: husband updated 9/27  My cct 32 min Erick Colace ACNP-BC [place] Pager # [phone] OR # [phone] if no answer  10/23/2021, 10:47 AM

## 2021-10-23 NOTE — Progress Notes (Signed)
Routine trach care provided to patient during morning round. Pt trialed on SBT CPAP/PS 5/5 without any pt effort. Pt returned to full support settings. RT will continue to be available as needed.

## 2021-10-23 NOTE — Progress Notes (Signed)
Pharmacy Antibiotic Note  [name] is a 46 y.o. female admitted on 09/27/2021 with pneumonia.  Pharmacy has been consulted for Vancomycin/Cefepime dosing. Pt is already on Levaquin for Stenotrophomonas PNA. Per CCM, worsening clinically with increasing pressor requirements. Broadening for other organisms. Worsening renal function.   Plan: Vancomycin 1500 mg IV x 1, further dosing per renal function trend Cefepime 2g IV q24h Already on Levaquin Trend WBC, temp, renal function  F/U infectious work-up Drug levels as indicated   Height: '5\' 2"'$  (157.5 cm) Weight: 73.2 kg (161 lb 6 oz) IBW/kg (Calculated) : 50.1  Temp (24hrs), Avg:98 F (36.7 C), Min:96.8 F (36 C), Max:99.7 F (37.6 C)  Recent Labs  Lab 10/20/21 1251 10/21/21 0556 10/21/21 1652 10/22/21 0151 10/22/21 1015 10/22/21 1641  WBC 11.4*  --   --   --   --   --   CREATININE  --  2.55* 2.87* 3.46* 3.67* 3.56*    Estimated Creatinine Clearance: 18.3 mL/min (A) (by C-G formula based on SCr of 3.56 mg/dL (H)).    Allergies  Allergen Reactions   Penicillins Hives    Tolerated Unasyn during 09/2021 admission     Narda Bonds, PharmD, [place] Clinical Pharmacist Phone: [phone]

## 2021-10-24 DIAGNOSIS — I609 Nontraumatic subarachnoid hemorrhage, unspecified: Secondary | ICD-10-CM | POA: Diagnosis not present

## 2021-10-24 LAB — BASIC METABOLIC PANEL
Anion gap: 10 (ref 5–15)
BUN: 60 mg/dL — ABNORMAL HIGH (ref 6–20)
CO2: 22 mmol/L (ref 22–32)
Calcium: 10.1 mg/dL (ref 8.9–10.3)
Chloride: 114 mmol/L — ABNORMAL HIGH (ref 98–111)
Creatinine, Ser: 2.84 mg/dL — ABNORMAL HIGH (ref 0.44–1.00)
GFR, Estimated: 20 mL/min — ABNORMAL LOW (ref >60)
Glucose, Bld: 249 mg/dL — ABNORMAL HIGH (ref 70–99)
Potassium: 2.6 mmol/L — CL (ref 3.5–5.1)
Sodium: 146 mmol/L — ABNORMAL HIGH (ref 135–145)

## 2021-10-24 LAB — GLUCOSE, CAPILLARY
Glucose-Capillary: 226 mg/dL — ABNORMAL HIGH (ref 70–99)
Glucose-Capillary: 212 mg/dL — ABNORMAL HIGH (ref 70–99)
Glucose-Capillary: 169 mg/dL — ABNORMAL HIGH (ref 70–99)
Glucose-Capillary: 86 mg/dL (ref 70–99)
Glucose-Capillary: 120 mg/dL — ABNORMAL HIGH (ref 70–99)
Glucose-Capillary: 144 mg/dL — ABNORMAL HIGH (ref 70–99)

## 2021-10-24 LAB — VANCOMYCIN, RANDOM: Vancomycin Rm: 17 ug/mL

## 2021-10-24 LAB — MAGNESIUM: Magnesium: 2.4 mg/dL (ref 1.7–2.4)

## 2021-10-24 MED ORDER — POTASSIUM CHLORIDE 10 MEQ/50ML IV SOLN
10.0000 meq | INTRAVENOUS | Status: AC
  Administered 2021-10-24 (×6): 10 meq via INTRAVENOUS
  Filled 2021-10-24 (×6): qty 50

## 2021-10-24 MED ORDER — POTASSIUM CHLORIDE 20 MEQ PO PACK
40.0000 meq | PACK | Freq: Once | ORAL | Status: AC
  Administered 2021-10-24: 40 meq
  Filled 2021-10-24: qty 2

## 2021-10-24 MED ORDER — INSULIN ASPART 100 UNIT/ML IJ SOLN
3.0000 [IU] | INTRAMUSCULAR | Status: DC
  Administered 2021-10-24 – 2021-10-30 (×21): 3 [IU] via SUBCUTANEOUS

## 2021-10-24 MED ORDER — VANCOMYCIN HCL IN DEXTROSE 1-5 GM/200ML-% IV SOLN
1000.0000 mg | Freq: Once | INTRAVENOUS | Status: AC
  Administered 2021-10-24: 1000 mg via INTRAVENOUS
  Filled 2021-10-24: qty 200

## 2021-10-24 NOTE — Progress Notes (Signed)
Pharmacy Antibiotic Note  [name] is a 46 y.o. female admitted on 09/28/2021 with [facility] now with prolonged hospitalization and ventilator dependence.  Pharmacy has been consulted for Vancomycin dosing. Patient is already on Levaquin for stenotrophomonas VAP.  Blood cultures remain NGTD. Respiratory culture with stenotrophomonas species.  Received vancomycin '1500mg'$  9/28 '@0300'$ . Vanc random this morning 17 mcg/mL, approximately 24 hours after dose.   Scr improving, but CrCl remains low at 20 mL/min. UOP appropriate at 1.6 mL/kg/hr  Plan: Redose with Vancomycin '1000mg'$  x 1.  Due to AKI, will check random level 9/30 with AM labs Continue Levaquin '500mg'$  Q48H - consider increasing to '750mg'$  if renal function continues to improve   Height: '5\' 2"'$  (157.5 cm) Weight: 74.1 kg (163 lb 5.8 oz) IBW/kg (Calculated) : 50.1  Temp (24hrs), Avg:97.9 F (36.6 C), Min:97.4 F (36.3 C), Max:98.2 F (36.8 C)  Recent Labs  Lab 10/20/21 1251 10/21/21 0556 10/22/21 1015 10/22/21 1641 10/23/21 0122 10/23/21 0406 10/23/21 1630 10/24/21 0407  WBC 11.4*  --   --   --   --   --   --   --   CREATININE  --    < > 3.67* 3.56*  --  3.58* 3.41* 2.84*  LATICACIDVEN  --   --   --   --  1.6 1.6  --   --   VANCORANDOM  --   --   --   --   --   --   --  17   < > = values in this interval not displayed.     Estimated Creatinine Clearance: 23.1 mL/min (A) (by C-G formula based on SCr of 2.84 mg/dL (H)).    Allergies  Allergen Reactions   Penicillins Hives    Tolerated Unasyn during 09/2021 admission     Erskine Speed, PharmD Clinical Pharmacist

## 2021-10-24 NOTE — Progress Notes (Signed)
Patient ID: [name], female   DOB: Nov 14, 1975, 46 y.o.   MRN: [id number] BP (!) 96/57   Pulse 79   Temp 99.7 F (37.6 C) (Axillary)   Resp (!) 24   Ht '5\' 2"'$  (1.575 m)   Wt 74.1 kg   SpO2 97%   BMI 29.88 kg/m  Kidney function improved.  Pupils fixed, dilated No corneals, no gag, no cough No oculocephalics No change neurologically. No recommendations Prognosis grave

## 2021-10-24 NOTE — Progress Notes (Signed)
Nutrition Follow-up  DOCUMENTATION CODES:   Not applicable  INTERVENTION:  Continue TF via Cortrak tube: Osmolite 1.5 at 50 ml/h (1200 ml per day) Prosource TF20 60 ml daily   Provides 1880 kcal, 95 gm protein, 912 ml free water daily    200 ml free water every 4 hours Total free water: 2112 ml   NUTRITION DIAGNOSIS:   Inadequate oral intake related to inability to eat as evidenced by NPO status. Ongoing  GOAL:   Patient will meet greater than or equal to 90% of their needs  Goal met via TF  MONITOR:   TF tolerance  REASON FOR ASSESSMENT:   Consult Enteral/tube feeding initiation and management  ASSESSMENT:   Pt with PMH of HTN and legally blind in L eye admitted after being found down by husband with CPR. Per CT pt with large SAH.  Pt discussed during ICU rounds and with RN and MD.  Currently on one pressor. Not a candidate for RRT.   9/26 cortrak placed; tip in distal antrum of the stomach   Medications: SSI, 3 units novolog every 4 hours, protonix, miralax, senokot-s  LR @ 50 ml/hr Levophed @ 2 mcg  10 mEq KCl x 6   Labs: sodium 146, K 2.6 CBG's: 169-246  UOP: 2830 mL x24 hours I/O's: +12.9 L since admission  Diet Order:   Diet Order     None       EDUCATION NEEDS:   No education needs have been identified at this time  Skin:  Skin Assessment: Reviewed RN Assessment  Last BM:  700 ml via rectal tube  Height:   Ht Readings from Last 1 Encounters:  10/07/21 5' 2"  (1.575 m)    Weight:   Wt Readings from Last 1 Encounters:  10/23/21 74.1 kg   BMI:  Body mass index is 29.88 kg/m.  Estimated Nutritional Needs:   Kcal:  1700-1900  Protein:  85-100 grams  Fluid:  >1.7 L/day  Lockie Pares., RD, LDN, CNSC See AMiON for contact information

## 2021-10-24 NOTE — Progress Notes (Addendum)
NAME:  [name], MRN:  [id number], DOB:  [birth year], LOS: 93 ADMISSION DATE:  10/21/2021, CONSULTATION DATE: 10/01/2021 REFERRING MD: ED physician, CHIEF COMPLAINT: Subarachnoid hemorrhage  History of Present Illness:  46 yo female former smoker was found unresponsive by her husband and EMS called.  She was making gurgling noises with her breathing.  She was found to have SVT and EMS performed DCCV.  She had GCS 4 in ER and intubated for airway protection.  Found to have large SAH and neurosurgery arranged for hospital admission.  PCCM consulted to assist with management in ICU.  Pertinent  Medical History  Hypertension, uterine fibroids Legally blind in left eye  Significant Hospital Events: Including procedures, antibiotic start and stop dates in addition to other pertinent events   9/07 presented unresponsive, hypotensive; CT head with subarachnoid hemorrhage 9/11 DDAVP give for Central DI, repeat head CT with worsening infarcts and herniation 9/12 Attempted transfer for second opinion at [facility] and [facility].  No beds at [facility] and transfer was declined at [facility] 9/13 no change in mental status.  Transfer to [facility] declined 9/14 [facility] declined transfer again.  No change.  DDAVP x 1 9/18 palliative care consulted 9/23 Na increased >> restart DDAVP and increase free water 9/25 plan for trach. Increased pressor requirement. Added midodrine  9/26 dc d5W. Weaning NE as able. On I/O cath protocol  9/27 worse renal fxn and new hyperkalemia   Interim History / Subjective:  No change   Objective   Blood pressure 99/69, pulse 61, temperature (Abnormal) 97.4 F (36.3 C), temperature source Axillary, resp. rate (Abnormal) 24, height '5\' 2"'$  (1.575 m), weight 74.1 kg, SpO2 100 %.    Vent Mode: PRVC FiO2 (%):  [40 %] 40 % Set Rate:  [24 bmp] 24 bmp Vt Set:  [400 mL] 400 mL PEEP:  [8 cmH20] 8 cmH20 Plateau Pressure:  [18 cmH20-21 cmH20] 21 cmH20   Intake/Output Summary (Last 24 hours) at  10/24/2021 0735 Last data filed at 10/24/2021 1093 Gross per 24 hour  Intake 3875.5 ml  Output 2730 ml  Net 1145.5 ml   Filed Weights   10/20/21 0500 10/21/21 0500 10/23/21 0500  Weight: 69.9 kg 73.2 kg 74.1 kg    Examination:  General: 46 year old female remains comatose on ventilator there is no changes clinically HEENT her tracheostomy is midline, tongue still protruding but moist Pulmonary coarse scattered rhonchi Cardiac: Regular rate and rhythm Neuro: Unresponsive, still has spinal cord reflexes with noxious stimulus Extremities diffuse anasarca GU clear yellow   Resolved Hospital Problem list    Aspiration pneumonitis >> completed ABx 9/13, AKI from hypovolemia  Assessment & Plan:   Compromised airway due to [facility] Tracheostomy status VDRF  RLL Stenotrophomonas Maltophilia PNA Plan Cont full vent support VAP bundle Routine trach care Day 3 vanc and day 2 levaquin   HH 5 SAH with brainstem herniation, brain compression Temperature dysregulation due to above  - very poor prognosis for meaningful neurologic recovery - family would like to continue supportive care Plan Supportive care   AKI -- ?ATN Looks like this is postobstructive Did improved after Foley was replaced Plan Not HD candidate Keep Foley in place  Fluid and electrolyte imbalance: Central DI, Hypernatremia, hypokalemia Plan Free water q 12 Replace K   Hypotension  -related to neuro process vs infection  Plan Cont current pressors; nothing more to add. This is futile   Hyperglycemia Plan ssi  Best Practice (right click and "Reselect all  SmartList Selections" daily)   Diet/type: tubefeeds  DVT prophylaxis: prophylactic heparin  GI prophylaxis: PPI Lines: Central line- RUE PICC Foley:  N/A Code Status:  full code Last date of multidisciplinary goals of care discussion: husband updated 9/27  My cct 2mn Cletis Muma E Syna Gad ACNP-BC [place]ager # [phone] #  [phone] no answer  10/24/2021, 7:35 AM

## 2021-10-24 NOTE — Progress Notes (Signed)
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, central DI, now with AKI  Subjective:  2.8L UOP past 24h SCr improved 2.8 from 3.4 K low, repleted this AM SNa 146 Remains on VP and NE Husband at bedside, updated  09/28 0701 - 09/29 0700 In: 3964 [I.V.:1386; MH/WK:0881.1; IV Piggyback:160.5] Out: 2830 [Urine:2830]  Filed Weights   10/20/21 0500 10/21/21 0500 10/23/21 0500  Weight: 69.9 kg 73.2 kg 74.1 kg    Scheduled Meds:  Chlorhexidine Gluconate Cloth  6 each Topical Daily   feeding supplement (PROSource TF20)  60 mL Per Tube Daily   free water  200 mL Per Tube Q4H   heparin injection (subcutaneous)  5,000 Units Subcutaneous Q8H   insulin aspart  0-15 Units Subcutaneous Q4H   midodrine  10 mg Per Tube Q8H   mouth rinse  15 mL Mouth Rinse Q2H   pantoprazole  40 mg Per Tube Daily   polyethylene glycol  17 g Per Tube Daily   potassium chloride  40 mEq Per Tube Once   senna-docusate  1 tablet Per Tube BID   silver sulfADIAZINE   Topical Daily   sodium chloride flush  10-40 mL Intracatheter Q12H   vancomycin variable dose per unstable renal function (pharmacist dosing)   Does not apply See admin instructions   Continuous Infusions:  feeding supplement (OSMOLITE 1.5 CAL) 50 mL/hr at 10/24/21 0900   lactated ringers 50 mL/hr at 10/24/21 0900   levofloxacin (LEVAQUIN) IV Stopped (10/23/21 1627)   norepinephrine (LEVOPHED) Adult infusion 3 mcg/min (10/24/21 0900)   potassium chloride Stopped (10/24/21 0832)   vancomycin 1,000 mg (10/24/21 0909)   vasopressin 0.03 Units/min (10/24/21 0905)   PRN Meds:.acetaminophen **OR** acetaminophen (TYLENOL) oral liquid 160 mg/5 mL **OR** acetaminophen, fentaNYL (SUBLIMAZE) injection, sodium chloride flush  Current Labs: reviewed    Physical Exam:  Blood pressure 122/85, pulse (!) 58, temperature (!) 97.4 F (36.3 C), temperature source Axillary, resp. rate (!) 24, height '5\' 2"'$  (1.575 m), weight 74.1  kg, SpO2 100 %. GEN: Lying in bed, not ENT: Tracheostomy present with protruding tongue CV: Regular PULM: Coarse breath sounds bilaterally ABD: Soft EXT: 2-3+ edema present GU Foley in  A Resolving AKI 2/2 BOO:      Normal baselien SCr BOO ? Central process Great UOP after foley placed Will not have indication for RRT Can cont solute / hydration with enteral route Hypothetically, not a candidate for any form of RRT given #3 as per previous ntoes Hyperkalemia: resolved 2/2 #1, doesn't need [facility] with severe neurological injury, perhaps brain death Cetnral DI, improved, per CCM on enteral free wat er Hypercalcemia, ? Immobility  P Cont foley catheter, do not remove No furthe recommendations, cont current care Will sign off for now.  Please call with any questions or concerns.    Pearson Grippe MD 10/24/2021, 9:44 AM  Recent Labs  Lab 10/23/21 0406 10/23/21 1630 10/24/21 0407  NA 148* 142 146*  K 4.1 3.4* 2.6*  CL 112* 115* 114*  CO2 21* 21* 22  GLUCOSE 133* 230* 249*  BUN 71* 71* 60*  CREATININE 3.58* 3.41* 2.84*  CALCIUM 10.8* 9.0 10.1    Recent Labs  Lab 10/20/21 1251  WBC 11.4*  HGB 8.0*  HCT 26.4*  MCV 99.2  PLT 362

## 2021-10-24 NOTE — Progress Notes (Signed)
eLink Physician-Brief Progress Note Patient Name: HETHER [name] DOB: 06-22-1975 MRN: [id number]   Date of Service  10/24/2021  HPI/Events of Note  Hypokalemia - K+ = 2.6 and Creatinine = 2.84.   eICU Interventions  Will replace K+.     Intervention Category Major Interventions: Electrolyte abnormality - evaluation and management  [name] 10/24/2021, 5:20 AM

## 2021-10-24 NOTE — Progress Notes (Signed)
Date and time results received: 10/24/21 0457  Critical Value: K 2.6  Name of Provider Notified: E-link

## 2021-10-25 DIAGNOSIS — I609 Nontraumatic subarachnoid hemorrhage, unspecified: Secondary | ICD-10-CM | POA: Diagnosis not present

## 2021-10-25 LAB — GLUCOSE, CAPILLARY
Glucose-Capillary: 112 mg/dL — ABNORMAL HIGH (ref 70–99)
Glucose-Capillary: 113 mg/dL — ABNORMAL HIGH (ref 70–99)
Glucose-Capillary: 115 mg/dL — ABNORMAL HIGH (ref 70–99)
Glucose-Capillary: 115 mg/dL — ABNORMAL HIGH (ref 70–99)
Glucose-Capillary: 124 mg/dL — ABNORMAL HIGH (ref 70–99)
Glucose-Capillary: 127 mg/dL — ABNORMAL HIGH (ref 70–99)

## 2021-10-25 LAB — CBC
HCT: 20.6 % — ABNORMAL LOW (ref 36.0–46.0)
HCT: 24.9 % — ABNORMAL LOW (ref 36.0–46.0)
Hemoglobin: 6.6 g/dL — CL (ref 12.0–15.0)
Hemoglobin: 7.7 g/dL — ABNORMAL LOW (ref 12.0–15.0)
MCH: 30.3 pg (ref 26.0–34.0)
MCH: 30.1 pg (ref 26.0–34.0)
MCHC: 32 g/dL (ref 30.0–36.0)
MCHC: 30.9 g/dL (ref 30.0–36.0)
MCV: 94.5 fL (ref 80.0–100.0)
MCV: 97.3 fL (ref 80.0–100.0)
Platelets: 228 10*3/uL (ref 150–400)
Platelets: 268 10*3/uL (ref 150–400)
RBC: 2.18 MIL/uL — ABNORMAL LOW (ref 3.87–5.11)
RBC: 2.56 MIL/uL — ABNORMAL LOW (ref 3.87–5.11)
RDW: 15.4 % (ref 11.5–15.5)
RDW: 15.8 % — ABNORMAL HIGH (ref 11.5–15.5)
WBC: 12.4 10*3/uL — ABNORMAL HIGH (ref 4.0–10.5)
WBC: 18.6 10*3/uL — ABNORMAL HIGH (ref 4.0–10.5)
nRBC: 1.4 % — ABNORMAL HIGH (ref 0.0–0.2)
nRBC: 1.7 % — ABNORMAL HIGH (ref 0.0–0.2)

## 2021-10-25 LAB — COMPREHENSIVE METABOLIC PANEL
ALT: 20 U/L (ref 0–44)
AST: 25 U/L (ref 15–41)
Albumin: <1.5 g/dL — ABNORMAL LOW (ref 3.5–5.0)
Alkaline Phosphatase: 116 U/L (ref 38–126)
BUN: 34 mg/dL — ABNORMAL HIGH (ref 6–20)
CO2: 14 mmol/L — ABNORMAL LOW (ref 22–32)
Calcium: 6.4 mg/dL — CL (ref 8.9–10.3)
Chloride: >130 mmol/L (ref 98–111)
Creatinine, Ser: 1.53 mg/dL — ABNORMAL HIGH (ref 0.44–1.00)
GFR, Estimated: 42 mL/min — ABNORMAL LOW (ref >60)
Glucose, Bld: 73 mg/dL (ref 70–99)
Potassium: 2.1 mmol/L — CL (ref 3.5–5.1)
Sodium: 151 mmol/L — ABNORMAL HIGH (ref 135–145)
Total Bilirubin: 0.4 mg/dL (ref 0.3–1.2)
Total Protein: 3.2 g/dL — ABNORMAL LOW (ref 6.5–8.1)

## 2021-10-25 LAB — TYPE AND SCREEN
ABO/RH(D): B POS
Antibody Screen: NEGATIVE

## 2021-10-25 LAB — BASIC METABOLIC PANEL
Anion gap: 6 (ref 5–15)
BUN: 53 mg/dL — ABNORMAL HIGH (ref 6–20)
CO2: 22 mmol/L (ref 22–32)
Calcium: 11.4 mg/dL — ABNORMAL HIGH (ref 8.9–10.3)
Chloride: 125 mmol/L — ABNORMAL HIGH (ref 98–111)
Creatinine, Ser: 2.76 mg/dL — ABNORMAL HIGH (ref 0.44–1.00)
GFR, Estimated: 21 mL/min — ABNORMAL LOW (ref >60)
Glucose, Bld: 137 mg/dL — ABNORMAL HIGH (ref 70–99)
Potassium: 5.6 mmol/L — ABNORMAL HIGH (ref 3.5–5.1)
Sodium: 153 mmol/L — ABNORMAL HIGH (ref 135–145)

## 2021-10-25 LAB — MAGNESIUM: Magnesium: 1.4 mg/dL — ABNORMAL LOW (ref 1.7–2.4)

## 2021-10-25 LAB — OSMOLALITY, URINE
Osmolality, Ur: 295 mOsm/kg — ABNORMAL LOW (ref 300–900)
Osmolality, Ur: 328 mOsm/kg (ref 300–900)

## 2021-10-25 LAB — VANCOMYCIN, RANDOM: Vancomycin Rm: 7 ug/mL

## 2021-10-25 LAB — SODIUM: Sodium: 154 mmol/L — ABNORMAL HIGH (ref 135–145)

## 2021-10-25 LAB — PHOSPHORUS: Phosphorus: 3.3 mg/dL (ref 2.5–4.6)

## 2021-10-25 MED ORDER — FREE WATER
200.0000 mL | Status: DC
  Administered 2021-10-25 – 2021-10-26 (×15): 200 mL

## 2021-10-25 MED ORDER — POTASSIUM CHLORIDE 20 MEQ PO PACK
40.0000 meq | PACK | Freq: Once | ORAL | Status: AC
  Administered 2021-10-25: 40 meq
  Filled 2021-10-25: qty 2

## 2021-10-25 MED ORDER — POTASSIUM CHLORIDE 10 MEQ/50ML IV SOLN
10.0000 meq | INTRAVENOUS | Status: AC
  Administered 2021-10-25 (×6): 10 meq via INTRAVENOUS
  Filled 2021-10-25 (×6): qty 50

## 2021-10-25 MED ORDER — DESMOPRESSIN ACETATE 4 MCG/ML IJ SOLN
4.0000 ug | Freq: Once | INTRAMUSCULAR | Status: AC
  Administered 2021-10-25: 4 ug via INTRAVENOUS
  Filled 2021-10-25: qty 1

## 2021-10-25 MED ORDER — POLYVINYL ALCOHOL 1.4 % OP SOLN
2.0000 [drp] | OPHTHALMIC | Status: DC | PRN
  Administered 2021-10-27 – 2021-12-31 (×3): 2 [drp] via OPHTHALMIC
  Filled 2021-10-25: qty 15

## 2021-10-25 MED ORDER — VANCOMYCIN VARIABLE DOSE PER UNSTABLE RENAL FUNCTION (PHARMACIST DOSING)
Status: DC
  Filled 2021-10-25: qty 1

## 2021-10-25 MED ORDER — MAGNESIUM SULFATE 4 GM/100ML IV SOLN
4.0000 g | Freq: Once | INTRAVENOUS | Status: AC
  Administered 2021-10-25: 4 g via INTRAVENOUS
  Filled 2021-10-25: qty 100

## 2021-10-25 MED ORDER — VANCOMYCIN HCL 750 MG/150ML IV SOLN
750.0000 mg | INTRAVENOUS | Status: AC
  Administered 2021-10-25: 750 mg via INTRAVENOUS
  Filled 2021-10-25: qty 150

## 2021-10-25 MED ORDER — SODIUM CHLORIDE 0.9 % IV SOLN: INTRAVENOUS | Status: DC | PRN

## 2021-10-25 MED ORDER — CALCIUM GLUCONATE-NACL 1-0.675 GM/50ML-% IV SOLN
1.0000 g | Freq: Once | INTRAVENOUS | Status: AC
  Administered 2021-10-25: 1000 mg via INTRAVENOUS
  Filled 2021-10-25: qty 50

## 2021-10-25 MED ORDER — VANCOMYCIN HCL 750 MG/150ML IV SOLN
750.0000 mg | INTRAVENOUS | Status: DC
  Administered 2021-10-25: 750 mg via INTRAVENOUS
  Filled 2021-10-25: qty 150

## 2021-10-25 NOTE — Progress Notes (Signed)
eLink Physician-Brief Progress Note Patient Name: [name] DOB: 1975-01-29 MRN: [id number]   Date of Service  10/25/2021  HPI/Events of Note  Anemia - Hgb = 6.6. Patient chart is flagged for refusing blood products. BP = 91/56 with MAP = 68.   eICU Interventions  Plan: Will not give PRBC at this time. Defer clarification with family to PCCM day rounding team.      Intervention Category Major Interventions: Other:  Markcus Lazenby Cornelia Copa 10/25/2021, 3:06 AM

## 2021-10-25 NOTE — Progress Notes (Signed)
Pharmacy Electrolyte Replacement  Recent Labs:  Recent Labs    10/25/21 0202  K 2.1*  MG 1.4*  PHOS 3.3  CREATININE 1.53*    Low Critical Values (K </= 2.5, Phos </= 1, Mg </= 1) Present: None  MD Contacted: K 2.1 - MD already repleted this AM  Plan: Give Mag sulfate 4g IV x 1 Recheck in AM per protocol   Arturo Morton, PharmD, BCPS Please check AMION for all [place] contact numbers Clinical Pharmacist 10/25/2021 12:33 PM

## 2021-10-25 NOTE — Progress Notes (Addendum)
NAME:  [name], MRN:  [id number], DOB:  06/10/1975, LOS: 26 ADMISSION DATE:  10/07/2021, CONSULTATION DATE: 10/15/2021 REFERRING MD: ED physician, CHIEF COMPLAINT: Subarachnoid hemorrhage  History of Present Illness:  46 yo female former smoker was found unresponsive by her husband and EMS called.  She was making gurgling noises with her breathing.  She was found to have SVT and EMS performed DCCV.  She had GCS 4 in ER and intubated for airway protection.  Found to have large SAH and neurosurgery arranged for hospital admission.  PCCM consulted to assist with management in ICU.  Pertinent  Medical History  Hypertension, uterine fibroids Legally blind in left eye  Significant Hospital Events: Including procedures, antibiotic start and stop dates in addition to other pertinent events   9/07 presented unresponsive, hypotensive; CT head with subarachnoid hemorrhage 9/11 DDAVP give for Central DI, repeat head CT with worsening infarcts and herniation 9/12 Attempted transfer for second opinion at [facility] and [facility].  No beds at [facility] and transfer was declined at [facility] 9/13 no change in mental status.  Transfer to [facility] declined 9/14 [facility] declined transfer again.  No change.  DDAVP x 1 9/18 palliative care consulted 9/23 Na increased >> restart DDAVP and increase free water 9/25 plan for trach. Increased pressor requirement. Added midodrine  9/26 dc d5W. Weaning NE as able. On I/O cath protocol  9/27 worse renal fxn and new hyperkalemia  9/30 renal function improving  Interim History / Subjective:   She received DDAVP overnight Note hemoglobin 6.6 Free water increased to 200 cc every 2 hours  Objective   Blood pressure 127/65, pulse 87, temperature 99.5 F (37.5 C), temperature source Axillary, resp. rate (!) 24, height '5\' 2"'$  (1.575 m), weight 74.1 kg, SpO2 98 %.    Vent Mode: PRVC FiO2 (%):  [30 %] 30 % Set Rate:  [24 bmp] 24 bmp Vt Set:  [400 mL] 400 mL PEEP:  [5 cmH20] 5  cmH20 Plateau Pressure:  [17 cmH20-20 cmH20] 19 cmH20   Intake/Output Summary (Last 24 hours) at 10/25/2021 0947 Last data filed at 10/25/2021 0830 Gross per 24 hour  Intake 4035.7 ml  Output 5500 ml  Net -1464.3 ml   Filed Weights   10/20/21 0500 10/21/21 0500 10/23/21 0500  Weight: 69.9 kg 73.2 kg 74.1 kg    Examination:  General: Ill-appearing woman, ventilated, completely unresponsive HEENT tongue protrusion, trach in place CDI Pulmonary scattered rhonchi Cardiac: Regular, distant, no murmur Neuro: Comatose.  Does have spinal reflexes with stimulation. She breathed over the set rate on on PS this am 9/30 Extremities diffuse anasarca   Resolved Hospital Problem list    Aspiration pneumonitis >> completed ABx 9/13, AKI from hypovolemia  Assessment & Plan:   Compromised airway due to [facility] Tracheostomy status VDRF  RLL Stenotrophomonas Maltophilia PNA Plan -Ventilator support pending decision making regarding apnea testing -VAP prevention orders -Routine trach care -Plan complete 5 days of vancomycin (day 4) and levofloxacin (day 3)  HH 5 SAH with brainstem herniation, brain compression Temperature dysregulation due to above  - very poor prognosis for meaningful neurologic recovery - family would like to continue supportive care Plan -supportive care  AKI -- ?ATN Looks like this is postobstructive Did improved after Foley was replaced Plan -Follow BMP and urine output, improving -Keep Foley in place  Fluid and electrolyte imbalance: Central DI, Hypernatremia, hypokalemia Plan -Free water increased on 9/29 -Replace electrolytes as indicated -DDAVP given overnight 9/29, consider initiation  central DI protocol today. UOP remains high. Consider change IVF to d5w  Anemia Plan: -type and screen. Need to clarify with significant other nad son whether she would be ok receiving blood products.  Hypotension  -related to neuro process vs infection  Plan -Wean  norepinephrine as able  Hyperglycemia Plan -CBG and SSI  Best Practice (right click and "Reselect all SmartList Selections" daily)   Diet/type: tubefeeds  DVT prophylaxis: prophylactic heparin  GI prophylaxis: PPI Lines: Central line- RUE PICC Foley:  N/A Code Status:  full code Last date of multidisciplinary goals of care discussion: husband updated 9/27  Critical care time: 31 minutes  Baltazar Apo, MD, PhD 10/25/2021, 9:52 AM [facility] [phone] or if no answer before 7:00PM call [phone] For any issues after 7:00PM please call eLink [phone]

## 2021-10-25 NOTE — Progress Notes (Signed)
eLink Physician-Brief Progress Note Patient Name: [name] DOB: [birth year] MRN: [id number]   Date of Service  10/25/2021  HPI/Events of Note  Multiple issues: 1. Hypokalemia  Hypocalcemia - K+ = 2.1 and Creatinine = 1.53. Ca++ = 6.4 which corrects to 8.4 (Low) given an Albumin < 1.5. 2. Hypernatremia - Na+ = 141.   eICU Interventions  Plan: Replace K+ and Ca++. Increase Free Water 200 mL per tube to Q 2 hours.  Repeat BMP at 12 noon.      Intervention Category Major Interventions: Electrolyte abnormality - evaluation and management  Din Bookwalter Eugene 10/25/2021, 3:41 AM

## 2021-10-25 NOTE — Progress Notes (Signed)
Patient ID: [name], female   DOB: May 24, 1975, 46 y.o.   MRN: [id number] BP (!) 105/54   Pulse 86   Temp 99.5 F (37.5 C) (Axillary)   Resp (!) 24   Ht '5\' 2"'$  (1.575 m)   Wt 74.1 kg   SpO2 96%   BMI 29.88 kg/m  Comatose No pupillary reaction to light No corneals No oculocephalics No cough No gag No neurological change. I did not note [name] overbreathing or triggering the vent

## 2021-10-25 NOTE — Progress Notes (Addendum)
eLink Physician-Brief Progress Note Patient Name: [name] DOB: February 03, 1975 MRN: [id number]   Date of Service  10/25/2021  HPI/Events of Note  Nursing reports 2.8 liter urine output with urine specific gravity = 1.003 c/w Central DI.   eICU Interventions  Plan: DDAVP 4 mcg IV now.  Continue to trend Na+. CMP in process.     Intervention Category Major Interventions: Other:  Shambria Camerer Cornelia Copa 10/25/2021, 2:22 AM

## 2021-10-25 NOTE — Progress Notes (Addendum)
Pharmacy Antibiotic Note  [name] is a 46 y.o. female admitted on 10/20/2021 with [facility] now with prolonged hospitalization and ventilator dependence.  Pharmacy has been consulted for Vancomycin dosing. Patient continues on Levaquin for stenotrophomonas VAP.  Blood cultures remain NGTD. Respiratory culture with stenotrophomonas species.  Received vancomycin '1500mg'$  9/28 '@0300'$  9/29 Vancomycin random 17, ~24 hours after dose - given vancomycin 1g IV x 1 9/30 VR 7  Scr improved to 1.53 today but back up to 2.76 on afternoon recheck  Plan: Will give vancomycin '1500mg'$  IV total dose today and recheck AM vancomycin random level prior to re-dosing with fluctuating renal function Levaquin '500mg'$  IV Q48H Monitor clinical progress, c/s, renal function closely Plan antibiotic therapy for 5 days per discussion with Dr. Lamonte Sakai - Levaquin through 9/30 and vancomycin through 10/2     Height: '5\' 2"'$  (157.5 cm) Weight: 74.1 kg (163 lb 5.8 oz) IBW/kg (Calculated) : 50.1  Temp (24hrs), Avg:98.8 F (37.1 C), Min:96.6 F (35.9 C), Max:99.9 F (37.7 C)  Recent Labs  Lab 10/20/21 1251 10/21/21 0556 10/22/21 1641 10/23/21 0122 10/23/21 0406 10/23/21 1630 10/24/21 0407 10/25/21 0202  WBC 11.4*  --   --   --   --   --   --  12.4*  CREATININE  --    < > 3.56*  --  3.58* 3.41* 2.84* 1.53*  LATICACIDVEN  --   --   --  1.6 1.6  --   --   --   VANCORANDOM  --   --   --   --   --   --  17 7   < > = values in this interval not displayed.     Estimated Creatinine Clearance: 42.8 mL/min (A) (by C-G formula based on SCr of 1.53 mg/dL (H)).    Allergies  Allergen Reactions   Penicillins Hives    Tolerated Unasyn during 09/2021 admission     Arturo Morton, PharmD, BCPS Please check AMION for all [place] contact numbers Clinical Pharmacist 10/25/2021 12:26 PM

## 2021-10-25 NOTE — Progress Notes (Signed)
Date and time results received: 10/25/21 0235  Test: Hgb Critical Value: 6.6  Name of Provider Notified: E-link, awaiting orders.

## 2021-10-26 DIAGNOSIS — I609 Nontraumatic subarachnoid hemorrhage, unspecified: Secondary | ICD-10-CM | POA: Diagnosis not present

## 2021-10-26 LAB — GLUCOSE, CAPILLARY
Glucose-Capillary: 129 mg/dL — ABNORMAL HIGH (ref 70–99)
Glucose-Capillary: 145 mg/dL — ABNORMAL HIGH (ref 70–99)
Glucose-Capillary: 111 mg/dL — ABNORMAL HIGH (ref 70–99)
Glucose-Capillary: 140 mg/dL — ABNORMAL HIGH (ref 70–99)
Glucose-Capillary: 171 mg/dL — ABNORMAL HIGH (ref 70–99)
Glucose-Capillary: 190 mg/dL — ABNORMAL HIGH (ref 70–99)

## 2021-10-26 LAB — OSMOLALITY, URINE
Osmolality, Ur: 326 mOsm/kg (ref 300–900)
Osmolality, Ur: 308 mOsm/kg (ref 300–900)

## 2021-10-26 LAB — SODIUM
Sodium: 147 mmol/L — ABNORMAL HIGH (ref 135–145)
Sodium: 154 mmol/L — ABNORMAL HIGH (ref 135–145)
Sodium: 150 mmol/L — ABNORMAL HIGH (ref 135–145)
Sodium: 147 mmol/L — ABNORMAL HIGH (ref 135–145)
Sodium: 140 mmol/L (ref 135–145)

## 2021-10-26 LAB — VANCOMYCIN, RANDOM: Vancomycin Rm: 37 ug/mL

## 2021-10-26 MED ORDER — DESMOPRESSIN ACETATE 4 MCG/ML IJ SOLN
4.0000 ug | Freq: Once | INTRAMUSCULAR | Status: AC
  Administered 2021-10-26: 4 ug via INTRAVENOUS
  Filled 2021-10-26: qty 1

## 2021-10-26 MED ORDER — FREE WATER
300.0000 mL | Status: DC
  Administered 2021-10-26 – 2021-10-27 (×8): 300 mL

## 2021-10-26 MED ORDER — DEXTROSE 5 % IV SOLN: INTRAVENOUS | Status: DC

## 2021-10-26 NOTE — Progress Notes (Signed)
Morning SBT performed with pt. Pt placed on PS/CPAP 8/5 with no effort made by patient. Pt returned to full support settings due to backup settings initiated. Routine trach care provided at this time also to pt. RT will continue to monitor and be available as needed.

## 2021-10-26 NOTE — Progress Notes (Signed)
Pharmacy Antibiotic Note  [name] is a 46 y.o. female admitted on 10/05/2021 with [facility] now with prolonged hospitalization and ventilator dependence.  Pharmacy has been consulted for Vancomycin dosing. Patient continues on Levaquin for stenotrophomonas VAP.  Blood cultures remain NGTD. Respiratory culture with stenotrophomonas species - completed Levaquin for this.   Vancomycin random this AM elevated at 37 mcg/ml, noted bump up in SCr to 2.76 << 1.53, so likely worsening renal fxn and accumulation. Original plan was for 5d OT but given neg blood cultures for 48h and likely AKI/accumulation - discussed w/ CCM and will go ahead and stop additional Vancomycin at this time.   Plan: - D/c Vancomycin - no additional needed at this time per discussion with CCM ([place]) - Will follow-up additional culture data and/or need for future antibiotic therapy  Height: '5\' 2"'$  (157.5 cm) Weight: 72.1 kg (158 lb 15.2 oz) IBW/kg (Calculated) : 50.1  Temp (24hrs), Avg:98.2 F (36.8 C), Min:97.3 F (36.3 C), Max:99.5 F (37.5 C)  Recent Labs  Lab 10/20/21 1251 10/21/21 0556 10/23/21 0122 10/23/21 0406 10/23/21 1630 10/24/21 0407 10/24/21 0407 10/25/21 0202 10/25/21 1055 10/25/21 1642 10/26/21 0523  WBC 11.4*  --   --   --   --   --   --  12.4*  --  18.6*  --   CREATININE  --    < >  --  3.58* 3.41* 2.84*  --  1.53* 2.76*  --   --   LATICACIDVEN  --   --  1.6 1.6  --   --   --   --   --   --   --   VANCORANDOM  --   --   --   --   --  17   < > 7  --   --  37   < > = values in this interval not displayed.     Estimated Creatinine Clearance: 23.4 mL/min (A) (by C-G formula based on SCr of 2.76 mg/dL (H)).    Allergies  Allergen Reactions   Penicillins Hives    Tolerated Unasyn during 09/2021 admission     Thank you for allowing pharmacy to be a part of this patient's care.  Alycia Rossetti, PharmD, BCPS Infectious Diseases Clinical Pharmacist 10/26/2021 1:44 PM   **Pharmacist phone  directory can now be found on [email or web address] ([email or web address]).  Listed under [place].

## 2021-10-26 NOTE — Progress Notes (Signed)
NAME:  [name], MRN:  [id number], DOB:  12-25-1975, LOS: 24 ADMISSION DATE:  10/16/2021, CONSULTATION DATE: 10/23/2021 REFERRING MD: ED physician, CHIEF COMPLAINT: Subarachnoid hemorrhage  History of Present Illness:  46 yo female former smoker was found unresponsive by her husband and EMS called.  She was making gurgling noises with her breathing.  She was found to have SVT and EMS performed DCCV.  She had GCS 4 in ER and intubated for airway protection.  Found to have large SAH and neurosurgery arranged for hospital admission.  PCCM consulted to assist with management in ICU.  Pertinent  Medical History  Hypertension, uterine fibroids Legally blind in left eye  Significant Hospital Events: Including procedures, antibiotic start and stop dates in addition to other pertinent events   9/07 presented unresponsive, hypotensive; CT head with subarachnoid hemorrhage 9/11 DDAVP give for Central DI, repeat head CT with worsening infarcts and herniation 9/12 Attempted transfer for second opinion at [facility] and [facility]  No beds at [facility] and transfer was declined at [facility] 9/13 no change in mental status.  Transfer to [facility] declined 9/14 [facility] declined transfer again.  No change.  DDAVP x 1 9/18 palliative care consulted 9/23 Na increased >> restart DDAVP and increase free water 9/25 plan for trach. Increased pressor requirement. Added midodrine  9/26 dc d5W. Weaning NE as able. On I/O cath protocol  9/27 worse renal fxn and new hyperkalemia  9/30 renal function improving  Interim History / Subjective:  Sodium up to 154, central DI protocol re-initiated 9/30 S Cr back up to 2.76     Objective   Blood pressure (!) 102/52, pulse 66, temperature (!) 96.8 F (36 C), temperature source Axillary, resp. rate (!) 24, height '5\' 2"'$  (1.575 m), weight 72.1 kg, SpO2 97 %.    Vent Mode: PRVC FiO2 (%):  [30 %] 30 % Set Rate:  [24 bmp] 24 bmp Vt Set:  [400 mL] 400 mL PEEP:  [5 cmH20] 5  cmH20 Plateau Pressure:  [17 cmH20-19 cmH20] 17 cmH20   Intake/Output Summary (Last 24 hours) at 10/26/2021 0920 Last data filed at 10/26/2021 0800 Gross per 24 hour  Intake 5809.12 ml  Output 5075 ml  Net 734.12 ml   Filed Weights   10/21/21 0500 10/23/21 0500 10/26/21 0500  Weight: 73.2 kg 74.1 kg 72.1 kg    Examination:  General: Ill-appearing woman mechanically ventilated HEENT large tongue, protruding. Pulmonary scattered rhonchi Cardiac: Regular, distant, no murmur Neuro: Comatose.  Does have spinal reflexes with stimulation. She breathed over the set rate on on PS this am 9/30 Extremities diffuse anasarca   Resolved Hospital Problem list    Aspiration pneumonitis >> completed ABx 9/13, AKI from hypovolemia  Assessment & Plan:   Compromised airway due to [facility] Tracheostomy status VDRF  RLL Stenotrophomonas Maltophilia PNA Plan -continue current vent support.  Has typically been apneic on PSV, but she did take some spontaneous breaths for me when stimulated on 9/30 -VAP prevention orders -Routine trach care -Complete 5 days of vancomycin (day 5), levofloxacin (day 4)  HH 5 SAH with brainstem herniation, brain compression Temperature dysregulation due to above  - very poor prognosis for meaningful neurologic recovery - family would like to continue supportive care Plan -Supportive care -She did take spontaneous breaths on PSV 9/30 when stimulated.  Formal apnea testing has not been done, but this would suggest that she does have brainstem function  AKI -- ?ATN Looks like this is postobstructive Did  improved after Foley was replaced Plan -Following BMP and urine output.  Some rising serum creatinine last 24 hours.  Need to try to meet volume losses.  Increase free water and change IV fluids to D5W 100 cc/h --Keep Foley in place  Fluid and electrolyte imbalance: Central DI, Hypernatremia, hypokalemia Plan -Free water increased on 10/1 -Replace lites as  indicated -Repeat DDAVP this morning -Change LR to D5W at 100 cc/h and work to replace volume losses  Anemia Plan: -type and screen.  We will need to clarify with patient's husband and son whether she would be willing to accept PRBC if indicated.  Goal hemoglobin 7.0  Hypotension  -related to neuro process vs infection  Plan -Wean norepinephrine as able -Continue vasopressin for now  Hyperglycemia Plan -CBG and SSI  Best Practice (right click and "Reselect all SmartList Selections" daily)   Diet/type: tubefeeds  DVT prophylaxis: prophylactic heparin  GI prophylaxis: PPI Lines: Central line- RUE PICC Foley:  N/A Code Status:  full code Last date of multidisciplinary goals of care discussion: husband updated 9/27  Critical care time: 32 minutes  Baltazar Apo, MD, PhD 10/26/2021, 9:20 AM [place] [facility] [phone] or if no answer before 7:00PM call [phone] For any issues after 7:00PM please call eLink [phone]

## 2021-10-26 NOTE — Progress Notes (Signed)
Patient ID: [name], female   DOB: 1975/09/26, 46 y.o.   MRN: [id number] BP (!) 90/45 Comment: levo titrated up  Pulse 73   Temp (!) 96.8 F (36 C) (Axillary) Comment: Bair hugger applied Comment (Src): RN notified  Resp (!) 24   Ht '5\' 2"'$  (1.575 m)   Wt 72.1 kg   SpO2 95%   BMI 29.07 kg/m  No neurological change Has pneumonia Kidney function improved.

## 2021-10-26 DEATH — deceased

## 2021-10-27 DIAGNOSIS — I609 Nontraumatic subarachnoid hemorrhage, unspecified: Secondary | ICD-10-CM | POA: Diagnosis not present

## 2021-10-27 DIAGNOSIS — Z515 Encounter for palliative care: Secondary | ICD-10-CM | POA: Diagnosis not present

## 2021-10-27 DIAGNOSIS — Z7189 Other specified counseling: Secondary | ICD-10-CM

## 2021-10-27 DIAGNOSIS — R4182 Altered mental status, unspecified: Secondary | ICD-10-CM | POA: Diagnosis not present

## 2021-10-27 LAB — CBC
HCT: 21.4 % — ABNORMAL LOW (ref 36.0–46.0)
Hemoglobin: 6.7 g/dL — CL (ref 12.0–15.0)
MCH: 30.2 pg (ref 26.0–34.0)
MCHC: 31.3 g/dL (ref 30.0–36.0)
MCV: 96.4 fL (ref 80.0–100.0)
Platelets: 216 10*3/uL (ref 150–400)
RBC: 2.22 MIL/uL — ABNORMAL LOW (ref 3.87–5.11)
RDW: 15.5 % (ref 11.5–15.5)
WBC: 14.8 10*3/uL — ABNORMAL HIGH (ref 4.0–10.5)
nRBC: 1.1 % — ABNORMAL HIGH (ref 0.0–0.2)

## 2021-10-27 LAB — GLUCOSE, CAPILLARY
Glucose-Capillary: 221 mg/dL — ABNORMAL HIGH (ref 70–99)
Glucose-Capillary: 258 mg/dL — ABNORMAL HIGH (ref 70–99)
Glucose-Capillary: 181 mg/dL — ABNORMAL HIGH (ref 70–99)
Glucose-Capillary: 121 mg/dL — ABNORMAL HIGH (ref 70–99)
Glucose-Capillary: 95 mg/dL (ref 70–99)
Glucose-Capillary: 72 mg/dL (ref 70–99)

## 2021-10-27 LAB — BASIC METABOLIC PANEL
Anion gap: 10 (ref 5–15)
BUN: 34 mg/dL — ABNORMAL HIGH (ref 6–20)
CO2: 23 mmol/L (ref 22–32)
Calcium: 10.9 mg/dL — ABNORMAL HIGH (ref 8.9–10.3)
Chloride: 108 mmol/L (ref 98–111)
Creatinine, Ser: 1.9 mg/dL — ABNORMAL HIGH (ref 0.44–1.00)
GFR, Estimated: 32 mL/min — ABNORMAL LOW (ref >60)
Glucose, Bld: 245 mg/dL — ABNORMAL HIGH (ref 70–99)
Potassium: 3.3 mmol/L — ABNORMAL LOW (ref 3.5–5.1)
Sodium: 141 mmol/L (ref 135–145)

## 2021-10-27 LAB — MAGNESIUM: Magnesium: 1.8 mg/dL (ref 1.7–2.4)

## 2021-10-27 LAB — PHOSPHORUS: Phosphorus: 4.2 mg/dL (ref 2.5–4.6)

## 2021-10-27 LAB — SODIUM
Sodium: 138 mmol/L (ref 135–145)
Sodium: 141 mmol/L (ref 135–145)

## 2021-10-27 MED ORDER — DESMOPRESSIN ACETATE SPRAY 0.01 % NA SOLN
10.0000 ug | Freq: Two times a day (BID) | NASAL | Status: DC
  Administered 2021-10-27 – 2021-11-03 (×16): 10 ug via NASAL
  Filled 2021-10-27: qty 5

## 2021-10-27 MED ORDER — MIDODRINE HCL 5 MG PO TABS
15.0000 mg | ORAL_TABLET | Freq: Three times a day (TID) | ORAL | Status: DC
  Administered 2021-10-27 – 2021-11-05 (×26): 15 mg
  Filled 2021-10-27 (×27): qty 3

## 2021-10-27 MED ORDER — POTASSIUM CHLORIDE 20 MEQ PO PACK: 60.0000 meq | PACK | Freq: Once | ORAL | Status: DC

## 2021-10-27 MED ORDER — MAGNESIUM SULFATE 2 GM/50ML IV SOLN
2.0000 g | Freq: Once | INTRAVENOUS | Status: AC
  Administered 2021-10-27: 2 g via INTRAVENOUS
  Filled 2021-10-27: qty 50

## 2021-10-27 MED ORDER — FREE WATER
300.0000 mL | Status: DC
  Administered 2021-10-27 (×3): 300 mL

## 2021-10-27 MED ORDER — POTASSIUM CHLORIDE 20 MEQ PO PACK
60.0000 meq | PACK | Freq: Once | ORAL | Status: AC
  Administered 2021-10-27: 60 meq
  Filled 2021-10-27: qty 3

## 2021-10-27 NOTE — Progress Notes (Signed)
NAME:  [name], MRN:  [id number], DOB:  Jul 23, 1975, LOS: 37 ADMISSION DATE:  10/18/2021, CONSULTATION DATE: 10/18/2021 REFERRING MD: ED physician, CHIEF COMPLAINT: Subarachnoid hemorrhage  History of Present Illness:  46 yo female former smoker was found unresponsive by her husband and EMS called.  She was making gurgling noises with her breathing.  She was found to have SVT and EMS performed DCCV.  She had GCS 4 in ER and intubated for airway protection.  Found to have large SAH and neurosurgery arranged for hospital admission.  PCCM consulted to assist with management in ICU.  Pertinent  Medical History  Hypertension, uterine fibroids Legally blind in left eye  Significant Hospital Events: Including procedures, antibiotic start and stop dates in addition to other pertinent events   9/07 presented unresponsive, hypotensive; CT head with subarachnoid hemorrhage 9/11 DDAVP give for Central DI, repeat head CT with worsening infarcts and herniation 9/12 Attempted transfer for second opinion at [facility] and [facility]  No beds at [facility] and transfer was declined at [facility] 9/13 no change in mental status.  Transfer to [facility] declined 9/14 [facility] declined transfer again.  No change.  DDAVP x 1 9/18 palliative care consulted 9/23 Na increased >> restart DDAVP and increase free water 9/25 plan for trach. Increased pressor requirement. Added midodrine  9/26 dc d5W. Weaning NE as able. On I/O cath protocol  9/27 worse renal fxn and new hyperkalemia  9/30 renal function improving  Interim History / Subjective:  Proving vasopressor requirements.  Increasing urine output consistent with worsening DI.  Objective   Blood pressure 104/71, pulse 62, temperature (!) 97 F (36.1 C), temperature source Axillary, resp. rate (!) 24, height '5\' 2"'$  (1.575 m), weight 72.4 kg, SpO2 99 %.    Vent Mode: PRVC FiO2 (%):  [30 %] 30 % Set Rate:  [24 bmp] 24 bmp Vt Set:  [400 mL] 400 mL PEEP:  [5 cmH20] 5  cmH20 Pressure Support:  [10 cmH20] 10 cmH20 Plateau Pressure:  [15 cmH20-20 cmH20] 15 cmH20   Intake/Output Summary (Last 24 hours) at 10/27/2021 0846 Last data filed at 10/27/2021 0800 Gross per 24 hour  Intake 4985.28 ml  Output 4250 ml  Net 735.28 ml    Filed Weights   10/23/21 0500 10/26/21 0500 10/27/21 0500  Weight: 74.1 kg 72.1 kg 72.4 kg    Examination:  General: Ill-appearing woman mechanically ventilated HEENT large tongue, protruding. Pulmonary scattered rhonchi Cardiac: Regular, distant, no murmur Neuro: Comatose.  Does have spinal reflexes with stimulation.  Extremities diffuse anasarca   Resolved Hospital Problem list    Aspiration pneumonitis >> completed ABx 9/13, AKI from hypovolemia  Assessment & Plan:   Compromised airway due to [facility] Tracheostomy status VDRF  RLL Stenotrophomonas Maltophilia PNA Plan -continue current vent support.  Has typically been apneic on PSV, but she did take some spontaneous breaths for me when stimulated on 9/30 -VAP prevention orders -Routine trach care -Complete 5 days of vancomycin (day 5), levofloxacin (day 4)  HH 5 SAH with brainstem herniation, brain compression Temperature dysregulation due to above  - very poor prognosis for meaningful neurologic recovery - family would like to continue supportive care Plan -Supportive care -She did take spontaneous breaths on PSV 9/30 when stimulated.   AKI -- ?ATN Looks like this is postobstructive Did improved after Foley was replaced Plan -Following BMP and urine output.  Some rising serum creatinine last 24 hours.  Need to try to meet volume losses.  Increase free water and change IV fluids to D5W 100 cc/h --Keep Foley in place  Fluid and electrolyte imbalance: Central DI, Hypernatremia, hypokalemia Plan -Scheduled nasal DDAVP  Anemia Plan: -type and screen.  We will need to clarify with patient's husband and son whether she would be willing to accept PRBC if  indicated.  Goal hemoglobin 7.0  Hypotension  -related to neuro process vs infection  Plan -Wean norepinephrine as able -Will increase midodrine  Hyperglycemia Plan -CBG and SSI  Best Practice (right click and "Reselect all SmartList Selections" daily)   Diet/type: tubefeeds  DVT prophylaxis: prophylactic heparin  GI prophylaxis: PPI Lines: Central line- RUE PICC Foley:  N/A Code Status:  full code Last date of multidisciplinary goals of care discussion: husband updated 9/27  Kipp Brood, MD [facility] ICU Physician [place]  Pager: [phone] Or Epic Secure Chat After hours: [phone].  10/27/2021, 8:50 AM

## 2021-10-27 NOTE — Progress Notes (Signed)
eLink Physician-Brief Progress Note Patient Name: [name] DOB: 1975/05/31 MRN: [id number]   Date of Service  10/27/2021  HPI/Events of Note  Notified of abnormal labs: K 3.3, Hgb 6.7  eICU Interventions  K repletion with KCL PO ordered. Will continue to monitor serial BMP.   With regard to Hgb, pt with baseline anemia, no evidence of active bleed.  Pt with active "Blood Product Refusal" FYI Husband currently at the bedside, he will be discussing consent for blood products later today.         [place] 10/27/2021, 5:29 AM

## 2021-10-27 NOTE — Progress Notes (Signed)
E-link notified of sodium of 140.

## 2021-10-27 NOTE — Progress Notes (Signed)
Patient ID: [name], female   DOB: 10-30-1975, 46 y.o.   MRN: [id number] BP 99/63   Pulse 63   Temp (!) 97.2 F (36.2 C) (Axillary)   Resp (!) 24   Ht '5\' 2"'$  (1.575 m)   Wt 72.4 kg   SpO2 99%   BMI 29.19 kg/m  Comatose. No neurological changes Labs continue to improve.  Awaiting decision about feeding tube.

## 2021-10-27 NOTE — Progress Notes (Signed)
eLink Physician-Brief Progress Note Patient Name: [name] DOB: [birth year] MRN: [id number]   Date of Service  10/27/2021  HPI/Events of Note  Notified of anemia with hgb 6.7 this morning.  No blood transfusion given.   eICU Interventions  CBC and BMP ordered.      Intervention Category Intermediate Interventions: Other:  Elsie Lincoln 10/27/2021, 10:38 PM

## 2021-10-27 NOTE — Progress Notes (Signed)
eLink Physician-Brief Progress Note Patient Name: [name] DOB: Jun 03, 1975 MRN: [id number]   Date of Service  10/27/2021  HPI/Events of Note  Notified of sodium of 140  eICU Interventions  Decreased free water flushes to 331m q4 hours Will continue to monitor serial sodium levels.         [place]/02/2021, 1:21 AM

## 2021-10-27 NOTE — Progress Notes (Signed)
E-link notified of hemoglobin of 6.7. RN discussed with husband about consent for blood products, given that currently patient's chart has a blood product refusal FYI. Husband stated he wants the patient's son to be present to help make that decision, and that the son should be here around 0900.

## 2021-10-27 NOTE — Progress Notes (Addendum)
Morning sodium level 138. Notified MD Agarwala, verbal orders were given to discontinue routine free water flushes.

## 2021-10-28 DIAGNOSIS — Z7189 Other specified counseling: Secondary | ICD-10-CM | POA: Diagnosis not present

## 2021-10-28 DIAGNOSIS — Z515 Encounter for palliative care: Secondary | ICD-10-CM | POA: Diagnosis not present

## 2021-10-28 DIAGNOSIS — I609 Nontraumatic subarachnoid hemorrhage, unspecified: Secondary | ICD-10-CM | POA: Diagnosis not present

## 2021-10-28 DIAGNOSIS — R4182 Altered mental status, unspecified: Secondary | ICD-10-CM | POA: Diagnosis not present

## 2021-10-28 LAB — BASIC METABOLIC PANEL
Anion gap: 10 (ref 5–15)
BUN: 37 mg/dL — ABNORMAL HIGH (ref 6–20)
CO2: 24 mmol/L (ref 22–32)
Calcium: 12.4 mg/dL — ABNORMAL HIGH (ref 8.9–10.3)
Chloride: 108 mmol/L (ref 98–111)
Creatinine, Ser: 1.83 mg/dL — ABNORMAL HIGH (ref 0.44–1.00)
GFR, Estimated: 34 mL/min — ABNORMAL LOW (ref >60)
Glucose, Bld: 82 mg/dL (ref 70–99)
Potassium: 4.2 mmol/L (ref 3.5–5.1)
Sodium: 142 mmol/L (ref 135–145)

## 2021-10-28 LAB — CBC
HCT: 23.1 % — ABNORMAL LOW (ref 36.0–46.0)
Hemoglobin: 7.4 g/dL — ABNORMAL LOW (ref 12.0–15.0)
MCH: 30 pg (ref 26.0–34.0)
MCHC: 32 g/dL (ref 30.0–36.0)
MCV: 93.5 fL (ref 80.0–100.0)
Platelets: 239 10*3/uL (ref 150–400)
RBC: 2.47 MIL/uL — ABNORMAL LOW (ref 3.87–5.11)
RDW: 15.2 % (ref 11.5–15.5)
WBC: 19.3 10*3/uL — ABNORMAL HIGH (ref 4.0–10.5)
nRBC: 1.1 % — ABNORMAL HIGH (ref 0.0–0.2)

## 2021-10-28 LAB — GLUCOSE, CAPILLARY
Glucose-Capillary: 111 mg/dL — ABNORMAL HIGH (ref 70–99)
Glucose-Capillary: 116 mg/dL — ABNORMAL HIGH (ref 70–99)
Glucose-Capillary: 117 mg/dL — ABNORMAL HIGH (ref 70–99)
Glucose-Capillary: 141 mg/dL — ABNORMAL HIGH (ref 70–99)
Glucose-Capillary: 186 mg/dL — ABNORMAL HIGH (ref 70–99)
Glucose-Capillary: 197 mg/dL — ABNORMAL HIGH (ref 70–99)

## 2021-10-28 LAB — CULTURE, BLOOD (ROUTINE X 2)
Culture: NO GROWTH
Culture: NO GROWTH
Special Requests: ADEQUATE
Special Requests: ADEQUATE

## 2021-10-28 LAB — SODIUM
Sodium: 141 mmol/L (ref 135–145)
Sodium: 143 mmol/L (ref 135–145)
Sodium: 145 mmol/L (ref 135–145)
Sodium: 145 mmol/L (ref 135–145)

## 2021-10-28 MED ORDER — HYDROCORTISONE 20 MG PO TABS
50.0000 mg | ORAL_TABLET | Freq: Two times a day (BID) | ORAL | Status: DC
  Administered 2021-10-28 – 2021-11-03 (×13): 50 mg
  Filled 2021-10-28 (×15): qty 1

## 2021-10-28 NOTE — Progress Notes (Signed)
Patient ID: [name], female   DOB: Feb 03, 1975, 46 y.o.   MRN: [id number] BP 124/77   Pulse 76   Temp 98.2 F (36.8 C) (Axillary)   Resp (!) 24   Ht '5\' 2"'$  (1.575 m)   Wt 72.6 kg   SpO2 98%   BMI 29.27 kg/m \ Comatose, not overbreathing ventilator There is no change neurologically Awaiting PEG tube placement Family concerned about her tongue.

## 2021-10-28 NOTE — Progress Notes (Signed)
Daily Progress Note   Patient Name: [name]       Date: 10/28/2021 DOB: 12-Dec-1975  Age: 46 y.o. MRN#: [id number] Attending Physician: Ashok Pall, MD Primary Care Physician: Glendon Axe, MD Admit Date: 10/22/2021 Length of Stay: 26 days  Reason for Consultation/Follow-up: Establishing goals of care and Psychosocial/spiritual support  HPI/Patient Profile:  46 year old female with hypertension who presented to the emergency room after being found unresponsive by her husband.  Patient was last seen at her baseline around 6 AM, patient's husband noticed that she was "breathing funny" and making gurgling noises.  He attempted to wake her and she was unresponsive, 37 year old daughter called EMS.  He started CPR and upon EMS arrival the patient was in SVT and they performed DCCV.  Heart rate improved.  There was not improvement in her mental status.   PMT was consulted for [place] conversations.  Subjective:   Subjective: Chart Reviewed. Updates received. Patient Assessed. Created space and opportunity for patient  and family to explore thoughts and feelings regarding current medical situation.  Today's Discussion: Today met with the patient's daughter at the bedside.  We discussed how family is coping and she seems not as communicative today. However, says they're doing ok. Offered continued support from Palliative Medicine. Discussed the plan for continued updates via family meeting at least once a week with PMT and the medical team. Daughter called the patient's husband and we decided to meet tomorrow at 9:30 am.   During my visit PA from IR was present and discussed plans for place,ent of LEG tubs per IR, planned for tomorrow. This would allow the NG tube to be discontinued. Daughter read the consent to the husband and signed for consent.   Per patient's daughter, at this time they want to continue aggressive care and full scope of care.  I continue to offered ongoing support with  palliative medicine.  In addition to weekly check-in (at a minimum) I offered that they can always call our service for any questions or concerns.  I provided emotional and general support through therapeutic listening, empathy, sharing of stories, and other techniques. I answered all questions and addressed all concerns to the best of my ability.  Review of Systems  Unable to perform ROS: Intubated    Objective:   Vital Signs:  BP 130/85   Pulse 82   Temp 98.7 F (37.1 C) (Axillary)   Resp (!) 24   Ht 5' 2"  (1.575 m)   Wt 72.6 kg   SpO2 98%   BMI 29.27 kg/m   Physical Exam: Physical Exam Vitals and nursing note reviewed.  Constitutional:      General: She is not in acute distress.    Appearance: She is obese. She is ill-appearing.     Interventions: She is intubated.  HENT:     Head: Normocephalic and atraumatic.     Mouth/Throat:     Comments: Noted tongue swelling Trach in place and ventilator attached Cardiovascular:     Rate and Rhythm: Normal rate.  Pulmonary:     Effort: No respiratory distress. She is intubated.     Comments: Intubated and on full support Abdominal:     General: Abdomen is flat.     Palpations: Abdomen is soft.  Neurological:     Mental Status: She is unresponsive.     Palliative Assessment/Data: 10% (NG Tube for tube feeding)   Assessment & Plan:   Impression: Present on Admission:  Subarachnoid hematoma ([place])  47 year old  female with catastrophic subarachnoid hemorrhage and apparent impending herniation.  She is unresponsive at this time.  Neurosurgery is following.  Family seems to be grieving substantially and having difficulty excepting the patient's current condition.  They have requested transfer for second opinion but all tertiary care centers in the area have declined to accept her.  Family currently in line with a full code, full scope.  They are requesting time and hoping for a miracle.  It has been communicated to them the  grave situation she is in.  Plans for IR PEG placement tomorrow. Given the degree of her injury and ramifications subsequent to, overall prognosis is grave.  SUMMARY OF RECOMMENDATIONS   Full code, full scope at this time Noted plans for PEG per IR tomorrow Continue support of patient and family Plan to meet with family and discuss updates and continue support once a week (or more if needed) PMT will continue to follow  Symptom Management:  Per primary team PMT is available to assist as needed  Code Status: Full code  Prognosis: Unable to determine  Discharge Planning: To Be Determined  Discussed with: Patient's family, medical team, nursing team  Thank you for allowing [place] to participate in the care of JOCLYNN LUMB PMT will continue to support holistically.  Billing based on MDM: High  Problems Addressed: One acute or chronic illness or injury that poses a threat to life or bodily function  Amount and/or Complexity of Data: Category 3:Discussion of management or test interpretation with external physician/other qualified health care professional/appropriate source (not separately reported)  Risks: N/A   Walden Field, NP Palliative Medicine Team  Team Phone # [phone] (Nights/Weekends)  09/24/2020, 8:17 AM

## 2021-10-28 NOTE — Progress Notes (Signed)
Trach sutures removed per protocol. No complications noted.

## 2021-10-28 NOTE — Consult Note (Signed)
Chief Complaint: Patient was seen in consultation today for percutaneous gastric tube placement Chief Complaint  Patient presents with   Altered Mental Status   at the request of Dr Lynetta Mare   Supervising Physician: Mir, Sharen Heck  Patient Status: [facility] - In-pt  History of Present Illness: [name] is a 46 y.o. female   Found down at home  EMS called Intubated in ED Large SAH; brainstem herniation Vent dependent PCCM  Aspiration pna Dysphagia Angio edema  Core trak in place Need for long term care Palliative following Request for percutaneous gastric tube placement Dr Dwaine Gale has approved procedure Planned for next few days in IR Temp 100.1 this am; wbc 19.3   Past Medical History:  Diagnosis Date   Blind left eye    Fibroid    Hypertension    Vaginal Pap smear, abnormal    Visual loss, right eye    Vitamin D deficiency     Past Surgical History:  Procedure Laterality Date   cryotheraphy     EYE SURGERY      Allergies: Penicillins  Medications: Prior to Admission medications   Medication Sig Start Date End Date Taking? Authorizing Provider  albuterol (PROVENTIL HFA;VENTOLIN HFA) 108 (90 BASE) MCG/ACT inhaler Inhale 1-2 puffs into the lungs every 6 (six) hours as needed for wheezing or shortness of breath. 03/21/14   Fransico Meadow, PA-C  atorvastatin (LIPITOR) 20 MG tablet Take 20 mg by mouth daily at 6 PM.     [provider]  Cholecalciferol (VITAMIN D PO) Take 1 tablet by mouth daily.    [provider]  Cyanocobalamin (B-12) 2500 MCG TABS Take 2,500 mcg by mouth daily.    [provider]  dicyclomine (BENTYL) 20 MG tablet Take 1 tablet (20 mg total) by mouth 4 (four) times daily -  before meals and at bedtime for 5 days. 01/12/17 01/17/17  Duffy Bruce, MD  ibuprofen (ADVIL,MOTRIN) 200 MG tablet Take 200 mg by mouth 2 (two) times daily as needed for mild pain.    [provider]  ibuprofen (ADVIL,MOTRIN) 800  MG tablet Take 1 tablet (800 mg total) by mouth 3 (three) times daily. 06/20/16   Keitha Butte, CNM  loperamide (IMODIUM) 2 MG capsule Take 1 capsule (2 mg total) by mouth 4 (four) times daily as needed for diarrhea or loose stools. 01/12/17   Duffy Bruce, MD  megestrol (MEGACE) 40 MG tablet Take 1 tablet (40 mg total) by mouth 3 (three) times daily. Patient not taking: Reported on 07/31/2016 06/20/16   Keitha Butte, CNM  ondansetron (ZOFRAN ODT) 4 MG disintegrating tablet Take 1 tablet (4 mg total) by mouth every 8 (eight) hours as needed for nausea or vomiting. 01/12/17   Duffy Bruce, MD  PATADAY 0.2 % SOLN Place 1 drop into both eyes daily.  12/30/14   [provider]  valsartan-hydrochlorothiazide (DIOVAN-HCT) 160-25 MG per tablet Take 1 tablet by mouth daily.  03/19/13   [provider]  VITAMIN E PO Take 1 tablet by mouth daily.    [provider]     Family History  Problem Relation Age of Onset   Hypertension Mother    Hypertension Father    Heart failure Father    Breast cancer Paternal Aunt     Social History   Socioeconomic History   Marital status: Divorced    Spouse name: Not on file   Number of children: Not on file   Years of  education: Not on file   Highest education level: Not on file  Occupational History   Not on file  Tobacco Use   Smoking status: Former    Packs/day: 0.25    Types: Cigarettes   Smokeless tobacco: Former  Substance and Sexual Activity   Alcohol use: No   Drug use: No   Sexual activity: Yes    Birth control/protection: None  Other Topics Concern   Not on file  Social History Narrative   Not on file   Social Determinants of Health   Financial Resource Strain: Not on file  Food Insecurity: Not on file  Transportation Needs: Not on file  Physical Activity: Not on file  Stress: Not on file  Social Connections: Not on file    Review of Systems: A 12 point ROS discussed and pertinent positives are  indicated in the HPI above.  All other systems are negative.  Vital Signs: BP 104/64   Pulse 78   Temp 100.1 F (37.8 C) (Axillary)   Resp (!) 24   Ht '5\' 2"'$  (1.575 m)   Wt 160 lb 0.9 oz (72.6 kg)   SpO2 95%   BMI 29.27 kg/m     Physical Exam Vitals reviewed.  Cardiovascular:     Rate and Rhythm: Normal rate.  Skin:    General: Skin is warm.  Neurological:     Comments: No response  Psychiatric:     Comments: Dtr at bedside Husband  [name] by phone Both agreeable to perc G tube procedure      Imaging: DG CHEST PORT 1 VIEW  Result Date: 10/22/2021 CLINICAL DATA:  Hypoxemia EXAM: PORTABLE CHEST 1 VIEW COMPARISON:  Radiographs 10/20/2021 FINDINGS: Tracheostomy with tip 1.9 cm from the carina. Right upper extremity PICC tip at the superior cavoatrial junction. Exchange of the enteric tube with new enteric tube tip below the inferior aspect of the image. Similar patchy opacity in the right lung base. Left basilar atelectasis. No pleural effusion or pneumothorax. Stable cardiomediastinal silhouette. No acute osseous abnormality. IMPRESSION: Similar patchy airspace disease in the right lung base. Lines and tubes in good position. Electronically Signed   By: Placido Sou M.D.   On: 10/22/2021 23:48   US RENAL  Result Date: 10/22/2021 CLINICAL DATA:  Acute kidney injury EXAM: RENAL / URINARY TRACT ULTRASOUND COMPLETE COMPARISON:  CT 10/14/2021. FINDINGS: Right Kidney: Renal measurements: 11.2 x 4.8 x 5.9 cm = volume: 164.9 mL. Moderate hydronephrosis. Normal cortical echogenicity. No mass. Left Kidney: Renal measurements: 12.6 x 5.3 x 5.4 cm = volume: 188.4 mL. Moderate hydronephrosis. Normal cortical echogenicity. No mass. Bladder: Foley catheter in place with persistent mild bladder distension. Mild bladder wall thickening. Other: None. IMPRESSION: Moderate hydronephrosis bilaterally. Foley catheter in place with persistent mild bladder distension. Nonspecific mild bladder wall  thickening. Electronically Signed   By: Maurine Simmering M.D.   On: 10/22/2021 10:54   DG Abd Portable 1V  Result Date: 10/21/2021 CLINICAL DATA:  Feeding tube placement EXAM: PORTABLE ABDOMEN - 1 VIEW COMPARISON:  10/20/2021 FINDINGS: There is interval removal of NG tube and placement of feeding tube with its tip in the region of distal antrum of the stomach. Bowel gas pattern is nonspecific. IMPRESSION: Tip of feeding tube is seen in the region of distal antrum of the stomach. Electronically Signed   By: Elmer Picker M.D.   On: 10/21/2021 12:37   DG Abd Portable 1V  Result Date: 10/20/2021 CLINICAL DATA:  NG tube placement EXAM: PORTABLE  ABDOMEN - 1 VIEW COMPARISON:  01/12/2017 FINDINGS: Tip of enteric tube is seen in the stomach. Distal portion of NG tube is coiled within fundus of the stomach. Bowel gas pattern is nonspecific. Linear densities in the lower lung fields suggest subsegmental atelectasis. IMPRESSION: Tip and side-port in NG tube are noted within the stomach. Electronically Signed   By: Elmer Picker M.D.   On: 10/20/2021 15:42   DG Chest Port 1 View  Result Date: 10/20/2021 CLINICAL DATA:  Tracheostomy, NG tube. EXAM: PORTABLE CHEST 1 VIEW COMPARISON:  Chest x-ray 10/09/2021 FINDINGS: There is a new tracheostomy with distal tip 11 mm above the carina. Right upper extremity PICC tip projects over the distal SVC. Enteric tube is coiled in the stomach, distal tip is not included on the image. There is mild new patchy opacity in the right lung base. The costophrenic angles are clear. No pneumothorax. Cardiomediastinal silhouette is within normal limits. No acute fractures. IMPRESSION: 1. The tip of the tracheostomy is 11 mm above the carina. 2. Enteric tube extends into the stomach, distal tip is not included on the image. 3. New patchy airspace disease in the right lung base. Electronically Signed   By: Ronney Asters M.D.   On: 10/20/2021 15:37   DG Chest Port 1 View  Result  Date: 10/09/2021 CLINICAL DATA:  Intercerebral hemorrhage. EXAM: PORTABLE CHEST 1 VIEW COMPARISON:  Chest x-ray 10/08/2021 FINDINGS: The endotracheal tube and NG tubes are stable. The tip of the endotracheal tube is 14 mm above the carina. The right PICC line is stable. The cardiac silhouette, mediastinal and hilar contours are within normal limits and unchanged. Streaky bibasilar atelectasis but no edema or effusions. IMPRESSION: 1. Stable support apparatus. 2. Streaky bibasilar atelectasis. Electronically Signed   By: Marijo Sanes M.D.   On: 10/09/2021 08:30   DG CHEST PORT 1 VIEW  Result Date: 10/08/2021 CLINICAL DATA:  Subarachnoid hemorrhage status post ET tube placement EXAM: PORTABLE CHEST 1 VIEW COMPARISON:  Chest radiograph dated 10/04/2021 FINDINGS: Lines/tubes: Endotracheal tube tip projects 6 mm above the trachea Enteric tube tip reaches the diaphragm and terminates below the field of view. Right upper extremity PICC tip projects over the superior cavoatrial junction. Chest: Lungs are clear without focal consolidation. Pleura: No pneumothorax or pleural effusion. Heart/mediastinum: The heart size and mediastinal contours are within normal limits. Bones: No acute osseous abnormality. IMPRESSION: 1.  Support apparatus as described. 2.  Clear lungs.  Normal heart size. Electronically Signed   By: Darrin Nipper M.D.   On: 10/08/2021 10:56   CT HEAD WO CONTRAST (5MM)  Result Date: 10/06/2021 CLINICAL DATA:  Subarachnoid hemorrhage EXAM: CT HEAD WITHOUT CONTRAST TECHNIQUE: Contiguous axial images were obtained from the base of the skull through the vertex without intravenous contrast. RADIATION DOSE REDUCTION: This exam was performed according to the departmental dose-optimization program which includes automated exposure control, adjustment of the mA and/or kV according to patient size and/or use of iterative reconstruction technique. COMPARISON:  10/20/2021 FINDINGS: Brain: There is diffuse subarachnoid  hemorrhage. CSF spaces are now completely effaced. There is downward transtentorial herniation of the brainstem and downward herniation of the cerebellar tonsils through the foramen magnum. Vascular: No hyperdense vessel or unexpected calcification. Skull: Normal. Negative for fracture or focal lesion. Sinuses/Orbits: Ethmoid and sphenoid sinus partial opacification. Normal orbits. Other: None IMPRESSION: 1. Diffuse subarachnoid hemorrhage with downward transtentorial herniation of the brainstem and downward herniation of the cerebellar tonsils through the foramen magnum. Electronically Signed  By: Ulyses Jarred M.D.   On: 10/06/2021 22:43   DG CHEST PORT 1 VIEW  Result Date: 10/04/2021 CLINICAL DATA:  PICC placement EXAM: PORTABLE CHEST - 1 VIEW COMPARISON:  10/04/2021 FINDINGS: Cardiomediastinal silhouette and pulmonary vasculature are within normal limits. Lungs are clear. Endotracheal tube tip located 10 mm above the carina. Nasogastric tube extends below the left hemidiaphragm. Right upper extremity PICC terminates at the cavoatrial junction. IMPRESSION: 1. Right upper extremity PICC has been repositioned appropriately terminates at the cavoatrial junction. 2. Endotracheal tube tip located 10 mm from the carina. These results will be called to the ordering clinician or representative by the Radiologist Assistant, and communication documented in the PACS or [employer]. Electronically Signed   By: Miachel Roux M.D.   On: 10/04/2021 09:31   DG CHEST PORT 1 VIEW  Result Date: 10/04/2021 CLINICAL DATA:  Central line placement EXAM: PORTABLE CHEST 1 VIEW COMPARISON:  09/26/2021 FINDINGS: Endotracheal tube at the carina. Lungs are clear.  No pleural effusion or pneumothorax. The heart is normal in size. Right arm PICC is looped in the lower SVC with its tip pointed caudally. IMPRESSION: Right arm PICC is looped in the lower SVC with its tip pointed caudally. Endotracheal tube terminates at the carina.  Consider withdrawal 3 cm, as clinically warranted. Electronically Signed   By: Julian Hy M.D.   On: 10/04/2021 00:50   [place] EKG SITE RITE  Result Date: 10/03/2021 If Site Rite image not attached, placement could not be confirmed due to current cardiac rhythm.  [place] EKG SITE RITE  Result Date: 10/03/2021 If Site Rite image not attached, placement could not be confirmed due to current cardiac rhythm.  [place] EKG SITE RITE  Result Date: 10/03/2021 If Site Rite image not attached, placement could not be confirmed due to current cardiac rhythm.  CT ANGIO HEAD NECK W WO CM  Result Date: 10/01/2021 CLINICAL DATA:  Subarachnoid hemorrhage [facility]) EXAM: CT ANGIOGRAPHY HEAD AND NECK TECHNIQUE: Multidetector CT imaging of the head and neck was performed using the standard protocol during bolus administration of intravenous contrast. Multiplanar CT image reconstructions and MIPs were obtained to evaluate the vascular anatomy. Carotid stenosis measurements (when applicable) are obtained utilizing NASCET criteria, using the distal internal carotid diameter as the denominator. RADIATION DOSE REDUCTION: This exam was performed according to the departmental dose-optimization program which includes automated exposure control, adjustment of the mA and/or kV according to patient size and/or use of iterative reconstruction technique. CONTRAST:  58m OMNIPAQUE IOHEXOL 350 MG/ML SOLN COMPARISON:  None Available. FINDINGS: CTA NECK FINDINGS Aortic arch: Great vessel origins are patent without significant stenosis. 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 Right carotid system: No evidence of dissection, stenosis (50% or greater), or occlusion. Poor contrast opacification at the skull base, which is gradual. Left carotid system: No evidence of dissection, stenosis (50% or greater), or occlusion. Poor contrast opacification at the skull base, which is gradual. Vertebral arteries:  Left dominant. No evidence of dissection, stenosis (50% or greater), or occlusion. Skeleton: No acute findings. Other neck: No acute findings. Upper chest: Dependent opacities in the visualized left upper lobe. Review of the MIP images confirms the above findings CTA HEAD FINDINGS Nondiagnostic evaluation intracranially due to poor contrast opacification of the intracranial arteries superimposed on subarachnoid hemorrhage. Review of the MIP images confirms the above findings IMPRESSION: 1. Nondiagnostic evaluation intracranially due to poor contrast opacification of the intracranial arteries superimposed on subarachnoid hemorrhage. The  poor contrast opacification is probably due to increased intracranial pressures given gradual decrease in opacification in the upper neck/skull base of all vessels and intracranial findings on same day CT head. A catheter arteriogram may be more useful for further evaluation/management of suspected ruptured aneurysm if clinically warranted. 2. No occlusion or significant stenosis in the neck. 3. Dependent opacities in the visualized left upper lobe, possibly aspiration. Electronically Signed   By: Margaretha Sheffield M.D.   On: 10/20/2021 09:57   CT Angio Chest PE W and/or Wo Contrast  Result Date: 10/06/2021 CLINICAL DATA:  Acute chest and abdominal pain. EXAM: CT ANGIOGRAPHY CHEST CT ABDOMEN AND PELVIS WITH CONTRAST TECHNIQUE: Multidetector CT imaging of the chest was performed using the standard protocol during bolus administration of intravenous contrast. Multiplanar CT image reconstructions and MIPs were obtained to evaluate the vascular anatomy. Multidetector CT imaging of the abdomen and pelvis was performed using the standard protocol during bolus administration of intravenous contrast. RADIATION DOSE REDUCTION: This exam was performed according to the departmental dose-optimization program which includes automated exposure control, adjustment of the mA and/or kV according to  patient size and/or use of iterative reconstruction technique. CONTRAST:  185m OMNIPAQUE IOHEXOL 350 MG/ML SOLN COMPARISON:  None Available. FINDINGS: CTA CHEST FINDINGS Cardiovascular: The heart is borderline enlarged for age. This is mainly due to enlarged left atrium and left ventricle. The aorta is normal in caliber. No atherosclerotic calcifications. The pulmonary arterial tree is well opacified. No filling defects to suggest pulmonary embolism. Mediastinum/Nodes: No mediastinal or hilar mass or lymphadenopathy. The endotracheal tube is in good position at the mid tracheal level. There is an NG tube coursing down the esophagus and into the stomach. Lungs/Pleura: Bilateral lower lobe pulmonary infiltrates could suggest aspiration. No pulmonary lesions, pulmonary edema or pneumothorax. No pleural effusions. Musculoskeletal: The bony thorax is intact. Review of the MIP images confirms the above findings. CT ABDOMEN and PELVIS FINDINGS Hepatobiliary: Low-attenuation lesion at the left hepatic dome is most likely a benign cyst. There are 2 small peripheral enhancing nodules in the right hepatic lobe one is located near the IVC other is near the upper pole region of the right kidney. These both measure approximately 11 mm. The could be small flash filling hemangiomas or small vascular shunts. I would recommend a follow-up MRI examination without and with contrast in 6 months to document stability. Suspect mild diffuse fatty infiltration of the liver with fatty sparing around the gallbladder fossa. The portal and hepatic veins are patent. The gallbladder is grossly normal. No common bile duct dilatation. Pancreas: No mass, inflammation or ductal dilatation. Spleen: Normal size. No focal lesions. Adrenals/Urinary Tract: Adrenal glands and kidneys are unremarkable. No renal lesions or hydronephrosis. The bladder is mildly distended. No bladder mass or calculi. Stomach/Bowel: The stomach contains an NG tube. The  duodenum, small bowel and colon are unremarkable. The terminal ileum and appendix are normal. Vascular/Lymphatic: The aorta is normal in caliber. No dissection. The branch vessels are patent. The major venous structures are patent. No mesenteric or retroperitoneal mass or adenopathy. Small scattered lymph nodes are noted. Reproductive: The uterus and ovaries are unremarkable. Other: No pelvic mass or adenopathy. No free pelvic fluid collections. No inguinal mass or adenopathy. No abdominal wall hernia or subcutaneous lesions. Musculoskeletal: No significant bony findings. Review of the MIP images confirms the above findings. IMPRESSION: 1. Left-sided cardiac enlargement. 2. No CT findings for pulmonary embolism. 3. Bilateral lower lobe pulmonary infiltrates could suggest aspiration pneumonia. 4. No  acute abdominal/pelvic findings. 5. Suspect mild diffuse fatty infiltration of the liver with fatty sparing around the gallbladder fossa. 6. Two small peripheral enhancing hepatic lesions as detailed above. These could be flash filling hemangiomas or vascular shunts. Recommend follow-up MR examination in 6 months. 7. Mild bladder distention. Electronically Signed   By: Marijo Sanes M.D.   On: 10/23/2021 08:08   CT ABDOMEN PELVIS W CONTRAST  Result Date: 10/16/2021 CLINICAL DATA:  Acute chest and abdominal pain. EXAM: CT ANGIOGRAPHY CHEST CT ABDOMEN AND PELVIS WITH CONTRAST TECHNIQUE: Multidetector CT imaging of the chest was performed using the standard protocol during bolus administration of intravenous contrast. Multiplanar CT image reconstructions and MIPs were obtained to evaluate the vascular anatomy. Multidetector CT imaging of the abdomen and pelvis was performed using the standard protocol during bolus administration of intravenous contrast. RADIATION DOSE REDUCTION: This exam was performed according to the departmental dose-optimization program which includes automated exposure control, adjustment of the mA  and/or kV according to patient size and/or use of iterative reconstruction technique. CONTRAST:  127m OMNIPAQUE IOHEXOL 350 MG/ML SOLN COMPARISON:  None Available. FINDINGS: CTA CHEST FINDINGS Cardiovascular: The heart is borderline enlarged for age. This is mainly due to enlarged left atrium and left ventricle. The aorta is normal in caliber. No atherosclerotic calcifications. The pulmonary arterial tree is well opacified. No filling defects to suggest pulmonary embolism. Mediastinum/Nodes: No mediastinal or hilar mass or lymphadenopathy. The endotracheal tube is in good position at the mid tracheal level. There is an NG tube coursing down the esophagus and into the stomach. Lungs/Pleura: Bilateral lower lobe pulmonary infiltrates could suggest aspiration. No pulmonary lesions, pulmonary edema or pneumothorax. No pleural effusions. Musculoskeletal: The bony thorax is intact. Review of the MIP images confirms the above findings. CT ABDOMEN and PELVIS FINDINGS Hepatobiliary: Low-attenuation lesion at the left hepatic dome is most likely a benign cyst. There are 2 small peripheral enhancing nodules in the right hepatic lobe one is located near the IVC other is near the upper pole region of the right kidney. These both measure approximately 11 mm. The could be small flash filling hemangiomas or small vascular shunts. I would recommend a follow-up MRI examination without and with contrast in 6 months to document stability. Suspect mild diffuse fatty infiltration of the liver with fatty sparing around the gallbladder fossa. The portal and hepatic veins are patent. The gallbladder is grossly normal. No common bile duct dilatation. Pancreas: No mass, inflammation or ductal dilatation. Spleen: Normal size. No focal lesions. Adrenals/Urinary Tract: Adrenal glands and kidneys are unremarkable. No renal lesions or hydronephrosis. The bladder is mildly distended. No bladder mass or calculi. Stomach/Bowel: The stomach contains  an NG tube. The duodenum, small bowel and colon are unremarkable. The terminal ileum and appendix are normal. Vascular/Lymphatic: The aorta is normal in caliber. No dissection. The branch vessels are patent. The major venous structures are patent. No mesenteric or retroperitoneal mass or adenopathy. Small scattered lymph nodes are noted. Reproductive: The uterus and ovaries are unremarkable. Other: No pelvic mass or adenopathy. No free pelvic fluid collections. No inguinal mass or adenopathy. No abdominal wall hernia or subcutaneous lesions. Musculoskeletal: No significant bony findings. Review of the MIP images confirms the above findings. IMPRESSION: 1. Left-sided cardiac enlargement. 2. No CT findings for pulmonary embolism. 3. Bilateral lower lobe pulmonary infiltrates could suggest aspiration pneumonia. 4. No acute abdominal/pelvic findings. 5. Suspect mild diffuse fatty infiltration of the liver with fatty sparing around the gallbladder fossa. 6. Two small  peripheral enhancing hepatic lesions as detailed above. These could be flash filling hemangiomas or vascular shunts. Recommend follow-up MR examination in 6 months. 7. Mild bladder distention. Electronically Signed   By: Marijo Sanes M.D.   On: 09/26/2021 08:08   CT Head Wo Contrast  Result Date: 10/01/2021 CLINICAL DATA:  Neck trauma, intoxicated or obtunded. Altered mental status. EXAM: CT HEAD WITHOUT CONTRAST CT CERVICAL SPINE WITHOUT CONTRAST TECHNIQUE: Multidetector CT imaging of the head and cervical spine was performed following the standard protocol without intravenous contrast. Multiplanar CT image reconstructions of the cervical spine were also generated. RADIATION DOSE REDUCTION: This exam was performed according to the departmental dose-optimization program which includes automated exposure control, adjustment of the mA and/or kV according to patient size and/or use of iterative reconstruction technique. COMPARISON:  10/03/2020 FINDINGS: CT  HEAD FINDINGS Brain: Large volume of subarachnoid hemorrhage in the basal cisterns reaching the cerebral convexities and refluxing into the fourth ventricle. No hydrocephalus. Effacement of subarachnoid spaces; no shift or herniation. No masslike finding or convincing cortical infarct. Vascular: Vessels are largely obscured. Skull: No signs of trauma Sinuses/Orbits: Probable extension of subarachnoid blood into the optic nerve sheaths bilaterally. Critical Value/emergent results were called by telephone at the time of interpretation on 10/06/2021 at 8:03 am to Dr Gustavus Messing , who is already aware. CT CERVICAL SPINE FINDINGS Alignment: No traumatic malalignment Skull base and vertebrae: No acute fracture Soft tissues and spinal canal: Small volume extension of subarachnoid blood into the upper canal. Unremarkable appearance of endotracheal and orogastric tubes in the neck. Disc levels:  No significant degenerative finding. Upper chest: Mild dependent atelectasis and interlobular septal thickening, there is pending chest CT. 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 Intraventricular reflux without hydrocephalus. Negative cervical spine CT. Electronically Signed   By: Jorje Guild M.D.   On: 10/08/2021 08:06   CT Cervical Spine Wo Contrast  Result Date: 10/13/2021 CLINICAL DATA:  Neck trauma, intoxicated or obtunded. Altered mental status. EXAM: CT HEAD WITHOUT CONTRAST CT CERVICAL SPINE WITHOUT CONTRAST TECHNIQUE: Multidetector CT imaging of the head and cervical spine was performed following the standard protocol without intravenous contrast. Multiplanar CT image reconstructions of the cervical spine were also generated. RADIATION DOSE REDUCTION: This exam was performed according to the departmental dose-optimization program which includes automated exposure control, adjustment of the mA and/or kV according to patient size and/or use of iterative reconstruction technique.  COMPARISON:  10/03/2020 FINDINGS: CT HEAD FINDINGS Brain: Large volume of subarachnoid hemorrhage in the basal cisterns reaching the cerebral convexities and refluxing into the fourth ventricle. No hydrocephalus. Effacement of subarachnoid spaces; no shift or herniation. No masslike finding or convincing cortical infarct. Vascular: Vessels are largely obscured. Skull: No signs of trauma Sinuses/Orbits: Probable extension of subarachnoid blood into the optic nerve sheaths bilaterally. Critical Value/emergent results were called by telephone at the time of interpretation on 10/19/2021 at 8:03 am to Dr Gustavus Messing , who is already aware. CT CERVICAL SPINE FINDINGS Alignment: No traumatic malalignment Skull base and vertebrae: No acute fracture Soft tissues and spinal canal: Small volume extension of subarachnoid blood into the upper canal. Unremarkable appearance of endotracheal and orogastric tubes in the neck. Disc levels:  No significant degenerative finding. Upper chest: Mild dependent atelectasis and interlobular septal thickening, there is pending chest CT. 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 Intraventricular reflux without hydrocephalus. Negative cervical spine CT. Electronically Signed   By: Jorje Guild M.D.   On: 10/18/2021 08:06  DG Chest Portable 1 View  Result Date: 10/20/2021 CLINICAL DATA:  Ventilator dependent respiratory failure. EXAM: PORTABLE CHEST 1 VIEW COMPARISON:  PA Lat 01/12/2017. FINDINGS: 7:04 a.m. ETT tip is 2.3 cm from the carina. NGT passes well into the stomach but neither the proximal side-hole or tip of the tube are filmed. Mild cardiomegaly is seen with mild perihilar vascular prominence and basilar interstitial edema. No significant pleural effusion is seen. There is patchy opacity in the retrocardiac left lower lobe concerning for pneumonia or aspiration. The lungs are otherwise clear of focal opacities. The thoracic cage is intact.  IMPRESSION: Cardiomegaly with perihilar vascular prominence and mild basilar interstitial edema. Increased opacity left lower lobe concerning for pneumonia or aspiration. No significant pleural collections. Support tubes as above. Electronically Signed   By: Telford Nab M.D.   On: 10/17/2021 07:21    Labs:  CBC: Recent Labs    10/25/21 0202 10/25/21 1642 10/27/21 0422 10/27/21 2313  WBC 12.4* 18.6* 14.8* 19.3*  HGB 6.6* 7.7* 6.7* 7.4*  HCT 20.6* 24.9* 21.4* 23.1*  PLT 228 268 216 239    COAGS: Recent Labs    10/01/2021 0658  INR 1.2    BMP: Recent Labs    10/25/21 0202 10/25/21 1055 10/25/21 1642 10/27/21 0422 10/27/21 0932 10/27/21 1641 10/27/21 2313 10/28/21 0334 10/28/21 0921  NA 151* 153*   < > 141   < > 141 142 141 143  K 2.1* 5.6*  --  3.3*  --   --  4.2  --   --   CL >130* 125*  --  108  --   --  108  --   --   CO2 14* 22  --  23  --   --  24  --   --   GLUCOSE 73 137*  --  245*  --   --  82  --   --   BUN 34* 53*  --  34*  --   --  37*  --   --   CALCIUM 6.4* 11.4*  --  10.9*  --   --  12.4*  --   --   CREATININE 1.53* 2.76*  --  1.90*  --   --  1.83*  --   --   GFRNONAA 42* 21*  --  32*  --   --  34*  --   --    < > = values in this interval not displayed.    LIVER FUNCTION TESTS: Recent Labs    09/29/2021 0658 10/11/21 0508 10/12/21 0106 10/13/21 0330 10/25/21 0202  BILITOT 0.9  --   --   --  0.4  AST 64*  --   --   --  25  ALT 50*  --   --   --  20  ALKPHOS 41  --   --   --  116  PROT 6.7  --   --   --  3.2*  ALBUMIN 3.6 1.8* 1.8* 1.8* <1.5*    TUMOR MARKERS: No results for input(s): "AFPTM", "CEA", "CA199", "CHROMGRNA" in the last 8760 hours.  Assessment and Plan:  SAH; brain stem herniation Aspiration pneumonia Dysphagia Need for long term care Scheduled for percutaneous gastric tube placement asap Temp today 100. 1; wbc 19.3 Will watch these values for next few days--- hope for later this week  Risks and benefits image guided  gastrostomy tube placement was discussed with the patient's family in room and on  phone; including, but not limited to the need for a barium enema during the procedure, bleeding, infection, peritonitis and/or damage to adjacent structures.  All questions were answered, all are agreeable to proceed Consent signed and in chart.    Thank you for this interesting consult.  I greatly enjoyed meeting TENA LINEBAUGH and look forward to participating in their care.  A copy of this report was sent to the requesting provider on this date.  Electronically Signed: Lavonia Drafts, PA-C 10/28/2021, 11:06 AM   I spent a total of 40 Minutes    in face to face in clinical consultation, greater than 50% of which was counseling/coordinating care for percutaneous gastric tube placement

## 2021-10-28 NOTE — Progress Notes (Signed)
NAME:  [name], MRN:  [id number], DOB:  1975/10/20, LOS: 1 ADMISSION DATE:  10/17/2021, CONSULTATION DATE: 09/29/2021 REFERRING MD: ED physician, CHIEF COMPLAINT: Subarachnoid hemorrhage  History of Present Illness:  46 yo female former smoker was found unresponsive by her husband and EMS called.  She was making gurgling noises with her breathing.  She was found to have SVT and EMS performed DCCV.  She had GCS 4 in ER and intubated for airway protection.  Found to have large SAH and neurosurgery arranged for hospital admission.  PCCM consulted to assist with management in ICU.  Pertinent  Medical History  Hypertension, uterine fibroids Legally blind in left eye  Significant Hospital Events: Including procedures, antibiotic start and stop dates in addition to other pertinent events   9/07 presented unresponsive, hypotensive; CT head with subarachnoid hemorrhage 9/11 DDAVP give for Central DI, repeat head CT with worsening infarcts and herniation 9/12 Attempted transfer for second opinion at [facility].  No beds at [facility] and transfer was declined at [facility] 9/13 no change in mental status.  Transfer to [facility] declined 9/14 [facility] declined transfer again.  No change.  DDAVP x 1 9/18 palliative care consulted 9/23 Na increased >> restart DDAVP and increase free water 9/25 plan for trach. Increased pressor requirement. Added midodrine  9/26 dc d5W. Weaning NE as able. On I/O cath protocol  9/27 worse renal fxn and new hyperkalemia  9/30 renal function improving  Interim History / Subjective:  On and off norepinephrine.    Objective   Blood pressure 130/85, pulse 82, temperature 98.7 F (37.1 C), temperature source Axillary, resp. rate (!) 24, height '5\' 2"'$  (1.575 m), weight 72.6 kg, SpO2 98 %.    Vent Mode: PRVC FiO2 (%):  [30 %] 30 % Set Rate:  [24 bmp] 24 bmp Vt Set:  [400 mL] 400 mL PEEP:  [5 cmH20] 5 cmH20 Pressure Support:  [10 cmH20] 10 cmH20 Plateau Pressure:  [15  cmH20-20 cmH20] 18 cmH20   Intake/Output Summary (Last 24 hours) at 10/28/2021 1310 Last data filed at 10/28/2021 1200 Gross per 24 hour  Intake 1088.61 ml  Output 3475 ml  Net -2386.39 ml    Filed Weights   10/26/21 0500 10/27/21 0500 10/28/21 0500  Weight: 72.1 kg 72.4 kg 72.6 kg    Examination:  General: Ill-appearing woman mechanically ventilated HEENT large tongue, protruding. Pulmonary scattered rhonchi Cardiac: Regular, distant, no murmur Neuro: Comatose.  Does have spinal reflexes with stimulation.  Extremities diffuse anasarca   Resolved Hospital Problem list    Aspiration pneumonitis >> completed ABx 9/13, AKI from hypovolemia  Assessment & Plan:   Compromised airway due to [facility] Tracheostomy status VDRF  RLL Stenotrophomonas Maltophilia PNA Plan -continue current vent support.  Has typically been apneic on PSV, but she did take some spontaneous breaths for me when stimulated on 9/30 -VAP prevention orders -Routine trach care -Complete 5 days of vancomycin (day 5), levofloxacin (day 4)  HH 5 SAH with brainstem herniation, brain compression Temperature dysregulation due to above  - very poor prognosis for meaningful neurologic recovery - family would like to continue supportive care Plan -Supportive care -She did take spontaneous breaths on PSV 9/30 when stimulated.   AKI -- ?ATN Looks like this is postobstructive Did improved after Foley was replaced Plan -Following BMP and urine output.  Some rising serum creatinine last 24 hours.  Need to try to meet volume losses.  Increase free water and change IV  fluids to D5W 100 cc/h -Remove Foley  Fluid and electrolyte imbalance: Central DI, Hypernatremia, hypokalemia Plan -Scheduled nasal DDAVP  Anemia Plan: -type and screen.  We will need to clarify with patient's husband and son whether she would be willing to accept PRBC if indicated.  Goal hemoglobin 7.0  Hypotension  -related to neuro process vs  infection  Plan -Wean norepinephrine as able -Continue midodrine - Add hydrocortisone as hypothalamic failure may be contributing to hypotension  Hyperglycemia Plan -CBG and SSI  Best Practice (right click and "Reselect all SmartList Selections" daily)   Diet/type: tubefeeds  for IR PEG tube.  DVT prophylaxis: prophylactic heparin  GI prophylaxis: PPI Lines: Central line- RUE PICC Foley:  N/A Code Status:  full code Last date of multidisciplinary goals of care discussion: husband updated 9/27  Kipp Brood, MD [facility] ICU Physician [place]  Pager: [phone] Or Epic Secure Chat After hours: [phone].  10/28/2021, 1:10 PM

## 2021-10-28 NOTE — Progress Notes (Signed)
Spoke with patient's daughter to clarify patient's wishes regarding blood transfusion.Patient daughter expressed that if patient has to get a blood transfusion, they would like a family member to donate the blood for her if they are found compatible. Reached out to blood bank and relayed info from them that donation would need to take place 10-14 days before patient had need, and would likely require the donating family member's doctor to send order to [facility]. Provided phone number for local [facility] for family to reach out with more questions.

## 2021-10-29 DIAGNOSIS — R4182 Altered mental status, unspecified: Secondary | ICD-10-CM | POA: Diagnosis not present

## 2021-10-29 DIAGNOSIS — I609 Nontraumatic subarachnoid hemorrhage, unspecified: Secondary | ICD-10-CM | POA: Diagnosis not present

## 2021-10-29 DIAGNOSIS — Z515 Encounter for palliative care: Secondary | ICD-10-CM | POA: Diagnosis not present

## 2021-10-29 DIAGNOSIS — Z7189 Other specified counseling: Secondary | ICD-10-CM | POA: Diagnosis not present

## 2021-10-29 LAB — SODIUM
Sodium: 144 mmol/L (ref 135–145)
Sodium: 149 mmol/L — ABNORMAL HIGH (ref 135–145)

## 2021-10-29 LAB — CBC
HCT: 23 % — ABNORMAL LOW (ref 36.0–46.0)
Hemoglobin: 7.2 g/dL — ABNORMAL LOW (ref 12.0–15.0)
MCH: 30.1 pg (ref 26.0–34.0)
MCHC: 31.3 g/dL (ref 30.0–36.0)
MCV: 96.2 fL (ref 80.0–100.0)
Platelets: 247 10*3/uL (ref 150–400)
RBC: 2.39 MIL/uL — ABNORMAL LOW (ref 3.87–5.11)
RDW: 15.5 % (ref 11.5–15.5)
WBC: 28.8 10*3/uL — ABNORMAL HIGH (ref 4.0–10.5)
nRBC: 0.8 % — ABNORMAL HIGH (ref 0.0–0.2)

## 2021-10-29 LAB — GLUCOSE, CAPILLARY
Glucose-Capillary: 194 mg/dL — ABNORMAL HIGH (ref 70–99)
Glucose-Capillary: 218 mg/dL — ABNORMAL HIGH (ref 70–99)
Glucose-Capillary: 185 mg/dL — ABNORMAL HIGH (ref 70–99)
Glucose-Capillary: 174 mg/dL — ABNORMAL HIGH (ref 70–99)
Glucose-Capillary: 144 mg/dL — ABNORMAL HIGH (ref 70–99)
Glucose-Capillary: 207 mg/dL — ABNORMAL HIGH (ref 70–99)

## 2021-10-29 LAB — BASIC METABOLIC PANEL
Anion gap: 9 (ref 5–15)
BUN: 46 mg/dL — ABNORMAL HIGH (ref 6–20)
CO2: 24 mmol/L (ref 22–32)
Calcium: 11.6 mg/dL — ABNORMAL HIGH (ref 8.9–10.3)
Chloride: 114 mmol/L — ABNORMAL HIGH (ref 98–111)
Creatinine, Ser: 1.81 mg/dL — ABNORMAL HIGH (ref 0.44–1.00)
GFR, Estimated: 34 mL/min — ABNORMAL LOW (ref >60)
Glucose, Bld: 236 mg/dL — ABNORMAL HIGH (ref 70–99)
Potassium: 4 mmol/L (ref 3.5–5.1)
Sodium: 147 mmol/L — ABNORMAL HIGH (ref 135–145)

## 2021-10-29 MED ORDER — LACTATED RINGERS IV BOLUS
500.0000 mL | Freq: Once | INTRAVENOUS | Status: AC
  Administered 2021-10-29: 500 mL via INTRAVENOUS

## 2021-10-29 MED ORDER — INSULIN ASPART 100 UNIT/ML IJ SOLN
0.0000 [IU] | INTRAMUSCULAR | Status: DC
  Administered 2021-10-29: 4 [IU] via SUBCUTANEOUS
  Administered 2021-10-29: 3 [IU] via SUBCUTANEOUS
  Administered 2021-10-29: 4 [IU] via SUBCUTANEOUS
  Administered 2021-10-29: 7 [IU] via SUBCUTANEOUS
  Administered 2021-10-30: 4 [IU] via SUBCUTANEOUS
  Administered 2021-10-30 (×2): 7 [IU] via SUBCUTANEOUS
  Administered 2021-10-30: 3 [IU] via SUBCUTANEOUS
  Administered 2021-10-30: 11 [IU] via SUBCUTANEOUS
  Administered 2021-10-30: 7 [IU] via SUBCUTANEOUS
  Administered 2021-10-30 – 2021-11-01 (×4): 3 [IU] via SUBCUTANEOUS
  Administered 2021-11-01 (×2): 4 [IU] via SUBCUTANEOUS
  Administered 2021-11-01 – 2021-11-04 (×5): 3 [IU] via SUBCUTANEOUS

## 2021-10-29 MED ORDER — FREE WATER
50.0000 mL | Status: DC
  Administered 2021-10-29 (×2): 50 mL

## 2021-10-29 MED ORDER — FREE WATER
100.0000 mL | Status: DC
  Administered 2021-10-29 – 2021-10-30 (×4): 100 mL

## 2021-10-29 NOTE — Progress Notes (Signed)
Daily Progress Note   Patient Name: [name]       Date: 10/29/2021 DOB: 06-Oct-1975  Age: 46 y.o. MRN#: [id number] Attending Physician: Ashok Pall, MD Primary Care Physician: Glendon Axe, MD Admit Date: 09/29/2021 Length of Stay: 27 days  Reason for Consultation/Follow-up: Establishing goals of care and Psychosocial/spiritual support  HPI/Patient Profile:  46 year old female with hypertension who presented to the emergency room after being found unresponsive by her husband.  Patient was last seen at her baseline around 6 AM, patient's husband noticed that she was "breathing funny" and making gurgling noises.  He attempted to wake her and she was unresponsive, 73 year old daughter called EMS.  He started CPR and upon EMS arrival the patient was in SVT and they performed DCCV.  Heart rate improved.  There was not improvement in her mental status.   PMT was consulted for [place] conversations.  Subjective:   Subjective: Chart Reviewed. Updates received. Patient Assessed. Created space and opportunity for patient  and family to explore thoughts and feelings regarding current medical situation.  Today's Discussion: Today I met with the patient's family at the bedside, including her husband, daughter, and father. I was joined by Dr. Lynetta Mare from PCCM and Orie Rout, RN.  We discussed the patient's current clinical status. Essentially the patient is unchanged, minimal neurological response/reflexes which are attributed to spinal reflexes. The medical team made it clear that the artificial support mechanisms (pressors, vent, tube feedings, Bear Hugger, etc) are providing all her physiological needs such as BP regulation, temperature regulation, breathing, nutrition, etc. Her body and brain are not regulating her homeostatic state. At this point the patient's daughter became very upset yelling "Enough! Enough! Enough!" And retreated to the window area. The PCCM physician and nurse both expressed  their sympathy for the situation but noted they are required to convey the facts and to also advocate for the patient as well as care for the family. The patient's husband expressed continued belief that God will take her or save her in his time, per his plan. They asked for time to speak amongst themselves and to be together as family. We politely left the room to give them time and space.  Per patient's daughter, at this time they want to continue aggressive care and full scope of care.  I continue to offered ongoing support with palliative medicine.  In addition to weekly check-in (at a minimum) I offered that they can always call our service for any questions or concerns.  I provided emotional and general support through therapeutic listening, empathy, sharing of stories, and other techniques. I answered all questions and addressed all concerns to the best of my ability.  Review of Systems  Unable to perform ROS: Intubated    Objective:   Vital Signs:  BP (!) 120/58   Pulse 87   Temp (!) 97.4 F (36.3 C) (Axillary)   Resp 18   Ht _0  (1.575 m)   Wt 72.6 kg   SpO2 97%   BMI 29.27 kg/m   Physical Exam: Physical Exam Vitals and nursing note reviewed.  Constitutional:      General: She is not in acute distress.    Appearance: She is obese. She is ill-appearing.     Interventions: She is intubated.  HENT:     Head: Normocephalic and atraumatic.     Mouth/Throat:     Comments: Noted tongue swelling Trach in place and ventilator attached Cardiovascular:     Rate and Rhythm: Normal  rate.  Pulmonary:     Effort: No respiratory distress. She is intubated.     Comments: Intubated and on full support Abdominal:     General: Abdomen is flat.     Palpations: Abdomen is soft.  Neurological:     Mental Status: She is unresponsive.     Palliative Assessment/Data: 10% (NG Tube for tube feeding)   Assessment & Plan:   Impression: Present on Admission:  Subarachnoid  hematoma ([place])  46 year old female with catastrophic subarachnoid hemorrhage and apparent impending herniation.  She is unresponsive at this time.  Neurosurgery is following.  Family seems to be grieving substantially and having difficulty excepting the patient's current condition.  They have requested transfer for second opinion but all tertiary care centers in the area have declined to accept her.  Family currently in line with a full code, full scope.  They are requesting time and hoping for a miracle.  It has been communicated to them the grave situation she is in.  Plans for IR PEG placement tomorrow. Given the degree of her injury and ramifications subsequent to, overall prognosis is grave.  SUMMARY OF RECOMMENDATIONS   Full code, full scope at this time Continue support of patient and family Plan to meet with family and discuss updates and continue support once a week (or more if needed) PMT will continue to follow  Symptom Management:  Per primary team PMT is available to assist as needed  Code Status: Full code  Prognosis: Unable to determine  Discharge Planning: To Be Determined  Discussed with: Patient's family, medical team, nursing team  Thank you for allowing [place] to participate in the care of [name] PMT will continue to support holistically.  Billing based on MDM: High  Problems Addressed: One acute or chronic illness or injury that poses a threat to life or bodily function  Amount and/or Complexity of Data: Category 3:Discussion of management or test interpretation with external physician/other qualified health care professional/appropriate source (not separately reported)  Risks: N/A   Walden Field, NP Palliative Medicine Team  Team Phone # [phone] (Nights/Weekends)  09/24/2020, 8:17 AM

## 2021-10-29 NOTE — Progress Notes (Addendum)
NAME:  [name], MRN:  [id number], DOB:  Jul 02, 1975, LOS: 108 ADMISSION DATE:  10/12/2021, CONSULTATION DATE: 10/16/2021 REFERRING MD: ED physician, CHIEF COMPLAINT: Subarachnoid hemorrhage  History of Present Illness:  46 yo female former smoker was found unresponsive by her husband and EMS called.  She was making gurgling noises with her breathing.  She was found to have SVT and EMS performed DCCV.  She had GCS 4 in ER and intubated for airway protection.  Found to have large SAH and neurosurgery arranged for hospital admission.  PCCM consulted to assist with management in ICU.  Pertinent  Medical History  Hypertension, uterine fibroids Legally blind in left eye  Significant Hospital Events: Including procedures, antibiotic start and stop dates in addition to other pertinent events   9/07 presented unresponsive, hypotensive; CT head with subarachnoid hemorrhage 9/11 DDAVP give for Central DI, repeat head CT with worsening infarcts and herniation 9/12 Attempted transfer for second opinion at [facility].  No beds at [facility] and transfer was declined at [facility] 9/13 no change in mental status.  Transfer to [facility] declined 9/14 [facility] declined transfer again.  No change.  DDAVP x 1 9/18 palliative care consulted 9/23 Na increased >> restart DDAVP and increase free water 9/25 plan for trach. Increased pressor requirement. Added midodrine  9/26 dc d5W. Weaning NE as able. On I/O cath protocol  9/27 worse renal fxn and new hyperkalemia  9/30 renal function improving  Interim History / Subjective:  On 1 mcg of levo today On mech vent  Objective   Blood pressure (!) 85/51, pulse 63, temperature (!) 96.7 F (35.9 C), temperature source Axillary, resp. rate (!) 24, height '5\' 2"'$  (1.575 m), weight 72.6 kg, SpO2 95 %.    Vent Mode: PRVC FiO2 (%):  [30 %] 30 % Set Rate:  [24 bmp] 24 bmp Vt Set:  [400 mL] 400 mL PEEP:  [5 cmH20] 5 cmH20 Pressure Support:  [10 cmH20] 10 cmH20 Plateau  Pressure:  [16 cmH20-20 cmH20] 19 cmH20   Intake/Output Summary (Last 24 hours) at 10/29/2021 0713 Last data filed at 10/29/2021 0600 Gross per 24 hour  Intake 637.67 ml  Output 4353 ml  Net -3715.33 ml    Filed Weights   10/26/21 0500 10/27/21 0500 10/28/21 0500  Weight: 72.1 kg 72.4 kg 72.6 kg    Examination:  General:  critically ill appearing on mech vent HEENT: MM pink/moist; large tongue; ETT in place Neuro: no spontaneous respiratory effort; pupils 5 mm with no reaction; mild cough/gag CV: s1s2, RRR, no m/r/g PULM:  dim clear BS bilaterally; on mech vent PRVC GI: soft, bsx4 active  Extremities: warm/dry, anasarca Skin: no rashes or lesions appreciated  No bmp/cbc this am  Resolved Hospital Problem list    Aspiration pneumonitis >> completed ABx 9/13, AKI from hypovolemia  Assessment & Plan:   Compromised airway due to [facility] Tracheostomy status VDRF  RLL Stenotrophomonas Maltophilia PNA -Has typically been apneic on PSV, but she did take some spontaneous breaths for me when stimulated on 9/30 P: -completed 5 days of vanc/levofloxacin on 10/3; follow resp culture sent 10/3 -LTVV strategy with tidal volumes of 6-8 cc/kg ideal body weight -decreased RR to 18  -trach care per protocol -Wean PEEP/FiO2 for SpO2 >92% -VAP bundle in place -Follow intermittent CXR and ABG PRN  HH 5 SAH with brainstem herniation, brain compression Temperature dysregulation due to above  - very poor prognosis for meaningful neurologic recovery - family would  like to continue supportive care P: -per NSG -frequent neuro checks -limit sedating meds -BP goal per NSG -Continue neuroprotective measures- normothermia, euglycemia, HOB greater than 30, head in neutral alignment, normocapnia, normoxia.   AKI -- ?ATN Looks like this is postobstructive Did improved after Foley was replaced P: -Trend BMP / urinary output -Replace electrolytes as indicated -Avoid nephrotoxic agents, ensure  adequate renal perfusion  Central DI, Hypernatremia,  Hypokalemia P: -trend NA and bmp -Nasal DDAVP -off free water  Anemia P: -trend cbc  Shock  -related to neuro process vs infection  P: -wean levo for map goal >65 -continue midodrine -continue hydrocortisone  Hyperglycemia P: -CBG above goal after starting steroids -increase ssi and continue TF coverage -cbg monitoring  Best Practice (right click and "Reselect all SmartList Selections" daily)   Diet/type: tubefeeds and NPO w/ meds via tube  DVT prophylaxis: prophylactic heparin  GI prophylaxis: PPI Lines: Central line- RUE PICC Foley:  N/A Code Status:  full code Last date of multidisciplinary goals of care discussion: 10/4 family meeting at [place] Time: 35 minutes   JD Rexene Agent [place] [facility] 10/29/2021, 7:53 AM  Please see [email or web address] for pager details.  From 7A-7P if no response, please call [phone]. After hours, please call ELink [phone].

## 2021-10-29 NOTE — Progress Notes (Signed)
Nutrition Follow-up  DOCUMENTATION CODES:   Not applicable  INTERVENTION:  Continue TF via Cortrak tube: Osmolite 1.5 at 50 ml/h (1200 ml per day) Prosource TF20 60 ml daily   Provides 1880 kcal, 95 gm protein, 912 ml free water daily    50 ml free water every 4 hours Total free water: 1212 ml   NUTRITION DIAGNOSIS:   Inadequate oral intake related to inability to eat as evidenced by NPO status. Ongoing  GOAL:   Patient will meet greater than or equal to 90% of their needs  Goal met via TF  MONITOR:   TF tolerance  REASON FOR ASSESSMENT:   Consult Enteral/tube feeding initiation and management  ASSESSMENT:   Pt with PMH of HTN and legally blind in L eye admitted after being found down by husband with CPR. Per CT pt with large SAH.  Pt discussed during ICU rounds and with RN and MD.  Currently on one pressor. Not a candidate for RRT.   9/26 cortrak placed; tip in distal antrum of the stomach   Medications: SSI, 3 units novolog every 4 hours, protonix, miralax, senokot-s  LR @ 50 ml/hr Levophed @ 4 mcg    Labs: sodium 147 CBG's: 117-194  UOP: 4353 mL x24 hours I/O's: +8.6 L since admission  Diet Order:   Diet Order     None       EDUCATION NEEDS:   No education needs have been identified at this time  Skin:  Skin Assessment:  (L thigh skin tear)  Last BM:  10/3  Height:   Ht Readings from Last 1 Encounters:  10/07/21 5' 2" (1.575 m)    Weight:   Wt Readings from Last 1 Encounters:  10/28/21 72.6 kg   BMI:  Body mass index is 29.27 kg/m.  Estimated Nutritional Needs:   Kcal:  1700-1900  Protein:  85-100 grams  Fluid:  >1.7 L/day  Heather P., RD, LDN, CNSC See AMiON for contact information   

## 2021-10-29 NOTE — Progress Notes (Signed)
Patient ID: [name], female   DOB: 12/03/1975, 46 y.o.   MRN: [id number] Comatose No neurologicaL changes noted [place] prognosis.

## 2021-10-30 DIAGNOSIS — R4182 Altered mental status, unspecified: Secondary | ICD-10-CM | POA: Diagnosis not present

## 2021-10-30 DIAGNOSIS — J9611 Chronic respiratory failure with hypoxia: Secondary | ICD-10-CM | POA: Diagnosis not present

## 2021-10-30 DIAGNOSIS — I609 Nontraumatic subarachnoid hemorrhage, unspecified: Secondary | ICD-10-CM | POA: Diagnosis not present

## 2021-10-30 DIAGNOSIS — N179 Acute kidney failure, unspecified: Secondary | ICD-10-CM | POA: Diagnosis not present

## 2021-10-30 LAB — BASIC METABOLIC PANEL
Anion gap: 6 (ref 5–15)
BUN: 40 mg/dL — ABNORMAL HIGH (ref 6–20)
CO2: 26 mmol/L (ref 22–32)
Calcium: 10.4 mg/dL — ABNORMAL HIGH (ref 8.9–10.3)
Chloride: 114 mmol/L — ABNORMAL HIGH (ref 98–111)
Creatinine, Ser: 1.49 mg/dL — ABNORMAL HIGH (ref 0.44–1.00)
GFR, Estimated: 43 mL/min — ABNORMAL LOW (ref >60)
Glucose, Bld: 198 mg/dL — ABNORMAL HIGH (ref 70–99)
Potassium: 3.8 mmol/L (ref 3.5–5.1)
Sodium: 146 mmol/L — ABNORMAL HIGH (ref 135–145)

## 2021-10-30 LAB — CBC
HCT: 21.5 % — ABNORMAL LOW (ref 36.0–46.0)
Hemoglobin: 6.6 g/dL — CL (ref 12.0–15.0)
MCH: 30.6 pg (ref 26.0–34.0)
MCHC: 30.7 g/dL (ref 30.0–36.0)
MCV: 99.5 fL (ref 80.0–100.0)
Platelets: 235 10*3/uL (ref 150–400)
RBC: 2.16 MIL/uL — ABNORMAL LOW (ref 3.87–5.11)
RDW: 16 % — ABNORMAL HIGH (ref 11.5–15.5)
WBC: 20.1 10*3/uL — ABNORMAL HIGH (ref 4.0–10.5)
nRBC: 0.6 % — ABNORMAL HIGH (ref 0.0–0.2)

## 2021-10-30 LAB — GLUCOSE, CAPILLARY
Glucose-Capillary: 263 mg/dL — ABNORMAL HIGH (ref 70–99)
Glucose-Capillary: 154 mg/dL — ABNORMAL HIGH (ref 70–99)
Glucose-Capillary: 234 mg/dL — ABNORMAL HIGH (ref 70–99)
Glucose-Capillary: 217 mg/dL — ABNORMAL HIGH (ref 70–99)
Glucose-Capillary: 141 mg/dL — ABNORMAL HIGH (ref 70–99)
Glucose-Capillary: 132 mg/dL — ABNORMAL HIGH (ref 70–99)

## 2021-10-30 LAB — SODIUM
Sodium: 149 mmol/L — ABNORMAL HIGH (ref 135–145)
Sodium: 150 mmol/L — ABNORMAL HIGH (ref 135–145)

## 2021-10-30 MED ORDER — FREE WATER
150.0000 mL | Status: DC
  Administered 2021-10-30 – 2021-11-01 (×8): 150 mL

## 2021-10-30 MED ORDER — VANCOMYCIN HCL IN DEXTROSE 1-5 GM/200ML-% IV SOLN: 1000.0000 mg | INTRAVENOUS | Status: DC

## 2021-10-30 MED ORDER — INSULIN ASPART 100 UNIT/ML IJ SOLN
6.0000 [IU] | INTRAMUSCULAR | Status: DC
  Administered 2021-10-30 – 2021-11-04 (×25): 6 [IU] via SUBCUTANEOUS

## 2021-10-30 NOTE — Progress Notes (Addendum)
NAME:  [name], MRN:  [id number], DOB:  March 08, 1975, LOS: 66 ADMISSION DATE:  10/20/2021, CONSULTATION DATE: 10/14/2021 REFERRING MD: ED physician, CHIEF COMPLAINT: Subarachnoid hemorrhage  History of Present Illness:  46 yo female former smoker was found unresponsive by her husband and EMS called.  She was making gurgling noises with her breathing.  She was found to have SVT and EMS performed DCCV.  She had GCS 4 in ER and intubated for airway protection.  Found to have large SAH and neurosurgery arranged for hospital admission.  PCCM consulted to assist with management in ICU.  Pertinent  Medical History  Hypertension, uterine fibroids Legally blind in left eye  Significant Hospital Events: Including procedures, antibiotic start and stop dates in addition to other pertinent events   9/07 presented unresponsive, hypotensive; CT head with subarachnoid hemorrhage 9/11 DDAVP give for Central DI, repeat head CT with worsening infarcts and herniation 9/12 Attempted transfer for second opinion at [facility] and [facility].  No beds at [facility] and transfer was declined at [facility] 9/13 no change in mental status.  Transfer to [facility] declined 9/14 [facility] declined transfer again.  No change.  DDAVP x 1 9/18 palliative care consulted 9/23 Na increased >> restart DDAVP and increase free water 9/25 plan for trach. Increased pressor requirement. Added midodrine  9/26 dc d5W. Weaning NE as able. On I/O cath protocol  9/27 worse renal fxn and new hyperkalemia  9/30 renal function improving 10/5: off levo  Interim History / Subjective:  Off levo today On mech vent Hgb 6.6: family doesn't want to transfuse blood to respect patient's wishes; no obvious signs of bleeding WBC 20.1 from 28.8; temp 98.5 F this am Off levo   Objective   Blood pressure (!) 100/57, pulse 69, temperature 98.5 F (36.9 C), temperature source Axillary, resp. rate 18, height '5\' 2"'$  (1.575 m), weight 72.6 kg, SpO2 95 %.    Vent  Mode: PRVC FiO2 (%):  [30 %] 30 % Set Rate:  [18 bmp] 18 bmp Vt Set:  [400 mL] 400 mL PEEP:  [5 cmH20] 5 cmH20 Pressure Support:  [10 cmH20] 10 cmH20 Plateau Pressure:  [15 cmH20-19 cmH20] 19 cmH20   Intake/Output Summary (Last 24 hours) at 10/30/2021 0725 Last data filed at 10/30/2021 0600 Gross per 24 hour  Intake 1311.68 ml  Output 2280 ml  Net -968.32 ml    Filed Weights   10/26/21 0500 10/27/21 0500 10/28/21 0500  Weight: 72.1 kg 72.4 kg 72.6 kg    Examination:  General:  critically ill appearing on mech vent HEENT: MM pink/moist; large tongue; ETT in place Neuro: no spontaneous respiratory effort; pupils 5 mm with no reaction CV: s1s2, RRR, no m/r/g PULM:  dim clear BS bilaterally; on mech vent PRVC GI: soft, bsx4 active  Extremities: warm/dry, anasarca Skin: no rashes or lesions appreciated  Bmp pending this am  Resolved Hospital Problem list    Aspiration pneumonitis >> completed ABx 9/13, AKI from hypovolemia  Assessment & Plan:   Compromised airway due to [facility] Tracheostomy status VDRF  RLL Stenotrophomonas Maltophilia PNA -Has typically been apneic on PSV, but she did take some spontaneous breaths for me when stimulated on 9/30 -resp culture 9/25 showing stenotrophomonas maltophilia completed 5 days of vanc/levofloxacin on 10/3 P: -follow resp culture sent 10/3 showing moderate stenotrophomonas maltophilia -consider restarting levaquin -LTVV strategy with tidal volumes of 6-8 cc/kg ideal body weight -trach care per protocol -Wean PEEP/FiO2 for SpO2 >92% -VAP bundle in  place -Follow intermittent CXR and ABG PRN  HH 5 SAH with brainstem herniation, brain compression Temperature dysregulation due to above  - very poor prognosis for meaningful neurologic recovery - family would like to continue supportive care P: -per NSG -frequent neuro checks -limit sedating meds -BP goal per NSG -Continue neuroprotective measures- normothermia, euglycemia, HOB  greater than 30, head in neutral alignment, normocapnia, normoxia.   AKI -- ?ATN Looks like this is postobstructive Did improved after Foley was replaced P: -Trend BMP / urinary output -Replace electrolytes as indicated -Avoid nephrotoxic agents, ensure adequate renal perfusion  Central DI, Hypernatremia,  Hypokalemia P: -bmp this am pending -free water started yesterday; continue free water 100 q4 -trend NA and bmp -Nasal DDAVP  Anemia P: -hgb 6.6; husband denied transfusion -no signs of active bleeding -consider stopping subcu heparin -trend cbc  Shock  -related to neuro process vs infection  P: -off levo -continue midodrine -continue hydrocortisone  Hyperglycemia P: -CBG above goal  -cont ssi and increase TF coverage -cbg monitoring  Best Practice (right click and "Reselect all SmartList Selections" daily)   Diet/type: tubefeeds and NPO w/ meds via tube  DVT prophylaxis: prophylactic heparin  GI prophylaxis: PPI Lines: Central line- RUE PICC Foley:  N/A Code Status:  full code Last date of multidisciplinary goals of care discussion: 10/5 spoke w/ husband at bedside and updated; told him that her hgb was 6.6 but no signs of bleeding; asked if he would be okay with [place] transfusing blood but denied transfusion at this time; states that patient had denied transfusion in past and wants to respect her wishes.   Critical Care Time: 35 minutes   JD Rexene Agent [facility] 10/30/2021, 7:25 AM  Please see [email or web address] for pager details.  From 7A-7P if no response, please call [phone]. After hours, please call ELink [phone].

## 2021-10-30 NOTE — Progress Notes (Signed)
Patient ID: [name], female   DOB: 08-15-1975, 46 y.o.   MRN: [id number] BP 130/75 (BP Location: Left Arm)   Pulse 73   Temp 98.2 F (36.8 C) (Axillary)   Resp 19   Ht '5\' 2"'$  (1.575 m)   Wt 72.6 kg   SpO2 98%   BMI 29.27 kg/m  Comatose No neurological change Peg tube tomorrow Prognosis is grave. No improvement since admission.

## 2021-10-31 DIAGNOSIS — I609 Nontraumatic subarachnoid hemorrhage, unspecified: Secondary | ICD-10-CM | POA: Diagnosis not present

## 2021-10-31 LAB — CULTURE, RESPIRATORY W GRAM STAIN

## 2021-10-31 LAB — GLUCOSE, CAPILLARY
Glucose-Capillary: 115 mg/dL — ABNORMAL HIGH (ref 70–99)
Glucose-Capillary: 109 mg/dL — ABNORMAL HIGH (ref 70–99)
Glucose-Capillary: 102 mg/dL — ABNORMAL HIGH (ref 70–99)
Glucose-Capillary: 93 mg/dL (ref 70–99)
Glucose-Capillary: 109 mg/dL — ABNORMAL HIGH (ref 70–99)
Glucose-Capillary: 133 mg/dL — ABNORMAL HIGH (ref 70–99)

## 2021-10-31 LAB — CBC
HCT: 21.8 % — ABNORMAL LOW (ref 36.0–46.0)
Hemoglobin: 6.4 g/dL — CL (ref 12.0–15.0)
MCH: 29.9 pg (ref 26.0–34.0)
MCHC: 29.4 g/dL — ABNORMAL LOW (ref 30.0–36.0)
MCV: 101.9 fL — ABNORMAL HIGH (ref 80.0–100.0)
Platelets: 230 10*3/uL (ref 150–400)
RBC: 2.14 MIL/uL — ABNORMAL LOW (ref 3.87–5.11)
RDW: 15.9 % — ABNORMAL HIGH (ref 11.5–15.5)
WBC: 20.9 10*3/uL — ABNORMAL HIGH (ref 4.0–10.5)
nRBC: 0.8 % — ABNORMAL HIGH (ref 0.0–0.2)

## 2021-10-31 LAB — SODIUM
Sodium: 151 mmol/L — ABNORMAL HIGH (ref 135–145)
Sodium: 151 mmol/L — ABNORMAL HIGH (ref 135–145)
Sodium: 153 mmol/L — ABNORMAL HIGH (ref 135–145)

## 2021-10-31 MED ORDER — GERHARDT'S BUTT CREAM
TOPICAL_CREAM | Freq: Two times a day (BID) | CUTANEOUS | Status: DC
  Administered 2021-11-01 – 2021-12-31 (×19): 1 via TOPICAL
  Filled 2021-10-31 (×2): qty 1

## 2021-10-31 MED ORDER — SODIUM CHLORIDE 0.9 % IV SOLN
250.0000 mg | Freq: Once | INTRAVENOUS | Status: AC
  Administered 2021-10-31: 250 mg via INTRAVENOUS
  Filled 2021-10-31: qty 20

## 2021-10-31 NOTE — Progress Notes (Signed)
Interventional Radiology Brief Note:  IR has been following for possible percutaneous gastrostomy tube placement.  Consult completed 10/3, however placement has been delayed the past 2 days due to concerns for fever and elevated WBC.  Fever resolved; WBC remains elevated possibly in the setting of steroids.  Unfortunately, patient now with ongoing drop in Hgb down to 6.4.  Her family has remained consistent in her desire to refuse blood products.  G-tube placement remains on hold.  IR will continue to follow for safe window of opportunity for placement.   Of note, patient with ongoing poor prognosis, remains comatose and unresponsive 1 month after her SAH event.   Brynda Greathouse, MS RD PA-C

## 2021-10-31 NOTE — Progress Notes (Signed)
Patient ID: [name], female   DOB: 1975-02-13, 46 y.o.   MRN: [id number] BP (!) 161/92   Pulse 73   Temp 98.1 F (36.7 C) (Axillary)   Resp 19   Ht '5\' 2"'$  (1.575 m)   Wt 70.5 kg   SpO2 96%   BMI 28.43 kg/m  Pupils dilated, fixed, no reaction to light No cough. No gag No corneals, no oculocephalics No neurological change Awaiting PEG, on hold for low hemoglobin

## 2021-10-31 NOTE — Progress Notes (Signed)
NAME:  [name], MRN:  [id number], DOB:  07/15/1975, LOS: 64 ADMISSION DATE:  09/30/2021, CONSULTATION DATE: 10/10/2021 REFERRING MD: ED physician, CHIEF COMPLAINT: Subarachnoid hemorrhage  History of Present Illness:  46 yo female former smoker was found unresponsive by her husband and EMS called.  She was making gurgling noises with her breathing.  She was found to have SVT and EMS performed DCCV.  She had GCS 4 in ER and intubated for airway protection.  Found to have large SAH and neurosurgery arranged for hospital admission.  PCCM consulted to assist with management in ICU.  Pertinent  Medical History  Hypertension, uterine fibroids Legally blind in left eye  Significant Hospital Events: Including procedures, antibiotic start and stop dates in addition to other pertinent events   9/07 presented unresponsive, hypotensive; CT head with subarachnoid hemorrhage 9/11 DDAVP give for Central DI, repeat head CT with worsening infarcts and herniation 9/12 Attempted transfer for second opinion at [facility] and [facility].  No beds at [facility] and transfer was declined at [facility] 9/13 no change in mental status.  Transfer to [facility] declined 9/14 [facility] declined transfer again.  No change.  DDAVP x 1 9/18 palliative care consulted 9/23 Na increased >> restart DDAVP and increase free water 9/25 plan for trach. Increased pressor requirement. Added midodrine  9/26 dc d5W. Weaning NE as able. On I/O cath protocol  9/27 worse renal fxn and new hyperkalemia  9/30 renal function improving 10/5: off levo 10/31/2021 plan for PEG tube  Interim History / Subjective:  Some significant change Hemoglobin continues to drift down due to family's refusal of getting her transfusions   Objective   Blood pressure (!) 161/92, pulse 73, temperature 98 F (36.7 C), temperature source Axillary, resp. rate 19, height '5\' 2"'$  (1.575 m), weight 70.5 kg, SpO2 96 %.    Vent Mode: PRVC FiO2 (%):  [30 %] 30 % Set Rate:  [18  bmp] 18 bmp Vt Set:  [400 mL] 400 mL PEEP:  [5 cmH20] 5 cmH20 Plateau Pressure:  [18 cmH20-19 cmH20] 19 cmH20   Intake/Output Summary (Last 24 hours) at 10/31/2021 0837 Last data filed at 10/31/2021 0600 Gross per 24 hour  Intake 1063.9 ml  Output 1560 ml  Net -496.1 ml   Filed Weights   10/27/21 0500 10/28/21 0500 10/31/21 0430  Weight: 72.4 kg 72.6 kg 70.5 kg    Examination:  General: Middle-age female who is sponsor HEENT: Protruding tongue,. Neuro: Negative doll's eyes, pupils fixed and dilated, no gag reflex CV: Heart sounds are regular PULM: Coarse rhonchi Vent pressure regulated volume control FIO2 30% PEEP 5 with RATE 18 VT 400  GI: soft, bsx4 active  GU: Extremities: Negative warm/dry,  edema  Skin: no rashes or lesions   Resolved Hospital Problem list    Aspiration pneumonitis >> completed ABx 9/13, AKI from hypovolemia  Assessment & Plan:   Compromised airway due to [facility] Tracheostomy status VDRF  RLL Stenotrophomonas Maltophilia PNA -Has typically been apneic on PSV, but she did take some spontaneous breaths for me when stimulated on 9/30 -resp culture 9/25 showing stenotrophomonas maltophilia completed 5 days of vanc/levofloxacin on 10/3 P: Continue to monitor No threshold for resuming antimicrobial therapy Full ventilatory     HH 5 SAH with brainstem herniation, brain compression Temperature dysregulation due to above  - very poor prognosis for meaningful neurologic recovery - family would like to continue supportive care P: Per neurosurgery  AKI -- ?ATN Lab Results  Component  Value Date   CREATININE 1.49 (H) 10/30/2021   CREATININE 1.81 (H) 10/29/2021   CREATININE 1.83 (H) 10/27/2021  Improving with Foley catheter P: -Trend BMP / urinary output -Replace electrolytes as indicated -Avoid nephrotoxic agents, ensure adequate renal perfusion  Central DI, Hypernatremia,  Hypokalemia Recent Labs  Lab 10/30/21 1826 10/30/21 2322  10/31/21 0417  NA 150* 151* 151*   Recent Labs  Lab 10/27/21 2313 10/29/21 0834 10/30/21 0600  K 4.2 4.0 3.8     P: Monitor sodium potassium Currently on free water and nasal DDAVP Monitor electrolytes Currently n.p.o. for PEG placement    Anemia Recent Labs    10/30/21 0600 10/31/21 0417  HGB 6.6* 6.4*    P: Family refusing transfusion unless it is blood from their family.  Shock  -related to neuro process vs infection  P: Off Levophed Continue midodrine Continue hydrocortisone    Hyperglycemia CBG (last 3)  Recent Labs    10/30/21 2316 10/31/21 0331 10/31/21 0746  GLUCAP 132* 115* 109*    P: Sliding-scale insulin  Best Practice (right click and "Reselect all SmartList Selections" daily)   Diet/type: tubefeeds and NPO w/ meds via tube  DVT prophylaxis: prophylactic heparin  GI prophylaxis: PPI Lines: Central line- RUE PICC Foley:  N/A Code Status:  full code 10/31/2021 updated family at [place] Time: 77 minutes  [place] Nurse Practitioner [place] Please consult [place] 10/31/2021, 8:37 AM

## 2021-11-01 DIAGNOSIS — I609 Nontraumatic subarachnoid hemorrhage, unspecified: Secondary | ICD-10-CM | POA: Diagnosis not present

## 2021-11-01 LAB — CBC
HCT: 21.9 % — ABNORMAL LOW (ref 36.0–46.0)
Hemoglobin: 6.6 g/dL — CL (ref 12.0–15.0)
MCH: 30.4 pg (ref 26.0–34.0)
MCHC: 30.1 g/dL (ref 30.0–36.0)
MCV: 100.9 fL — ABNORMAL HIGH (ref 80.0–100.0)
Platelets: 251 10*3/uL (ref 150–400)
RBC: 2.17 MIL/uL — ABNORMAL LOW (ref 3.87–5.11)
RDW: 16 % — ABNORMAL HIGH (ref 11.5–15.5)
WBC: 19.6 10*3/uL — ABNORMAL HIGH (ref 4.0–10.5)
nRBC: 0.8 % — ABNORMAL HIGH (ref 0.0–0.2)

## 2021-11-01 LAB — BASIC METABOLIC PANEL
Anion gap: 7 (ref 5–15)
BUN: 36 mg/dL — ABNORMAL HIGH (ref 6–20)
CO2: 26 mmol/L (ref 22–32)
Calcium: 9.6 mg/dL (ref 8.9–10.3)
Chloride: 118 mmol/L — ABNORMAL HIGH (ref 98–111)
Creatinine, Ser: 1.06 mg/dL — ABNORMAL HIGH (ref 0.44–1.00)
GFR, Estimated: >60 mL/min (ref >60)
Glucose, Bld: 176 mg/dL — ABNORMAL HIGH (ref 70–99)
Potassium: 3.8 mmol/L (ref 3.5–5.1)
Sodium: 151 mmol/L — ABNORMAL HIGH (ref 135–145)

## 2021-11-01 LAB — GLUCOSE, CAPILLARY
Glucose-Capillary: 139 mg/dL — ABNORMAL HIGH (ref 70–99)
Glucose-Capillary: 164 mg/dL — ABNORMAL HIGH (ref 70–99)
Glucose-Capillary: 136 mg/dL — ABNORMAL HIGH (ref 70–99)
Glucose-Capillary: 116 mg/dL — ABNORMAL HIGH (ref 70–99)
Glucose-Capillary: 151 mg/dL — ABNORMAL HIGH (ref 70–99)
Glucose-Capillary: 132 mg/dL — ABNORMAL HIGH (ref 70–99)

## 2021-11-01 MED ORDER — FREE WATER
100.0000 mL | Status: DC
  Administered 2021-11-01 – 2021-11-04 (×37): 100 mL

## 2021-11-01 NOTE — Progress Notes (Addendum)
NAME:  [name], MRN:  [id number], DOB:  04/28/1975, LOS: 31 ADMISSION DATE:  09/29/2021, CONSULTATION DATE: 10/11/2021 REFERRING MD: ED physician, CHIEF COMPLAINT: Subarachnoid hemorrhage  History of Present Illness:  46 yo female former smoker was found unresponsive by her husband and EMS called.  She was making gurgling noises with her breathing.  She was found to have SVT and EMS performed DCCV.  She had GCS 4 in ER and intubated for airway protection.  Found to have large SAH and neurosurgery arranged for hospital admission.  PCCM consulted to assist with management in ICU.  Pertinent  Medical History  Hypertension, uterine fibroids Legally blind in left eye  Significant Hospital Events: Including procedures, antibiotic start and stop dates in addition to other pertinent events   9/07 presented unresponsive, hypotensive; CT head with subarachnoid hemorrhage 9/11 DDAVP give for Central DI, repeat head CT with worsening infarcts and herniation 9/12 Attempted transfer for second opinion at [facility] and [facility].  No beds at [facility] and transfer was declined at [facility] 9/13 no change in mental status.  Transfer to [facility] declined 9/14 [facility] declined transfer again.  No change.  DDAVP x 1 9/18 palliative care consulted 9/23 Na increased >> restart DDAVP and increase free water 9/25 plan for trach. Increased pressor requirement. Added midodrine  9/26 dc d5W. Weaning NE as able. On I/O cath protocol  9/27 worse renal fxn and new hyperkalemia  9/30 renal function improving 10/5: off levo 10/31/2021 plan for PEG tube 10/7 Bps elevated in AM, monitoring  Interim History / Subjective:  NAEON. Bps elevated.   Objective   Blood pressure (!) 146/96, pulse 60, temperature (!) 94.8 F (34.9 C), resp. rate 19, height '5\' 2"'$  (1.575 m), weight 69.5 kg, SpO2 96 %.    Vent Mode: PRVC FiO2 (%):  [30 %] 30 % Set Rate:  [18 bmp] 18 bmp Vt Set:  [400 mL] 400 mL PEEP:  [5 cmH20] 5 cmH20 Plateau  Pressure:  [19 cmH20-20 cmH20] 19 cmH20   Intake/Output Summary (Last 24 hours) at 11/01/2021 0920 Last data filed at 11/01/2021 0800 Gross per 24 hour  Intake 1692.78 ml  Output 1573 ml  Net 119.78 ml    Filed Weights   10/28/21 0500 10/31/21 0430 11/01/21 0415  Weight: 72.6 kg 70.5 kg 69.5 kg    Examination:  General: Middle-age female who is unresponsive on no sedation HEENT: Protruding tongue,. Neuro: Negative doll's eyes, pupils fixed and dilated, no gag reflex CV: Heart sounds are regular PULM: Coarse rhonchi GI: soft, bsx4 active  Extremities: Negative warm/dry,  edema  Skin: no rashes or lesions   Resolved Hospital Problem list    Aspiration pneumonitis >> completed ABx 9/13, AKI from hypovolemia  Assessment & Plan:  Subarachnoid hemorrhage with neurologic devastation, comatose, brain compression: Multiple week hospitalization without improvement.  Reported spontaneous respirations 10/25/2021.  Otherwise no signs of brainstem reflexes. -- Supportive care per patient/family wishes -- Consider repeat brain death testing 2022-11-05   Hyponatremia: Improved with DDAVP.  Likely central DI in the setting of severe brain trauma. -- Continue DDAVP -- Free water increased 10/7 --daily BMP   Acute kidney injury: Likely hypovolemic in the setting of robust urine output likely in setting of central DI.  Improving. -- Free water as needed -- Tube feeds -- minimize lab draws   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: -- No signs of bleeding -- No transfusions per patient and family  wishes, only want to use family blood which is not an option -- Minimize blood draws, CBC only if concern for infection of bleeding, BMP weekly or as needed   Hyperglycemia: Sliding scale insulin  Best Practice (right click and "Reselect all SmartList Selections" daily)   Diet/type: tubefeeds and NPO w/ meds via tube  DVT prophylaxis: prophylactic heparin  GI  prophylaxis: PPI Lines: Central line- RUE PICC Foley:  N/A Code Status:  full code 11/01/2021 updated family at bedside  Critical Care Time:  CRITICAL CARE Performed by: Bonna Gains Sapphira Harjo   Total critical care time: 32 minutes  Critical care time was exclusive of separately billable procedures and treating other patients.  Critical care was necessary to treat or prevent imminent or life-threatening deterioration.  Critical care was time spent personally by me on the following activities: development of treatment plan with patient and/or surrogate as well as nursing, discussions with consultants, evaluation of patient's response to treatment, examination of patient, obtaining history from patient or surrogate, ordering and performing treatments and interventions, ordering and review of laboratory studies, ordering and review of radiographic studies, pulse oximetry and re-evaluation of patient's condition.   Lanier Clam, MD [facility] Please consult Amion 11/01/2021, 9:20 AM

## 2021-11-01 NOTE — Progress Notes (Signed)
   Providing Compassionate, Quality Care - Together  NEUROSURGERY PROGRESS NOTE   S: No issues overnight.   O: EXAM:  BP (!) 146/96   Pulse 60   Temp (!) 94.8 F (34.9 C) (Rectal)   Resp 19   Ht '5\' 2"'$  (1.575 m)   Wt 69.5 kg   SpO2 96%   BMI 28.02 kg/m   Trach, eyes closed, pupils fixed/dilated, slightly inferior gaze No response to pain  ASSESSMENT:  46 y.o. female with   Subarachnoid hemorrhage  PLAN: -No significant change    Thank you for allowing me to participate in this patient's care.  Please do not hesitate to call with questions or concerns.   Elwin Sleight, [facility]

## 2021-11-02 DIAGNOSIS — I609 Nontraumatic subarachnoid hemorrhage, unspecified: Secondary | ICD-10-CM | POA: Diagnosis not present

## 2021-11-02 LAB — BASIC METABOLIC PANEL
Anion gap: 4 — ABNORMAL LOW (ref 5–15)
BUN: 35 mg/dL — ABNORMAL HIGH (ref 6–20)
CO2: 28 mmol/L (ref 22–32)
Calcium: 9.1 mg/dL (ref 8.9–10.3)
Chloride: 119 mmol/L — ABNORMAL HIGH (ref 98–111)
Creatinine, Ser: 0.94 mg/dL (ref 0.44–1.00)
GFR, Estimated: >60 mL/min (ref >60)
Glucose, Bld: 122 mg/dL — ABNORMAL HIGH (ref 70–99)
Potassium: 4 mmol/L (ref 3.5–5.1)
Sodium: 151 mmol/L — ABNORMAL HIGH (ref 135–145)

## 2021-11-02 LAB — GLUCOSE, CAPILLARY
Glucose-Capillary: 122 mg/dL — ABNORMAL HIGH (ref 70–99)
Glucose-Capillary: 103 mg/dL — ABNORMAL HIGH (ref 70–99)
Glucose-Capillary: 134 mg/dL — ABNORMAL HIGH (ref 70–99)
Glucose-Capillary: 101 mg/dL — ABNORMAL HIGH (ref 70–99)
Glucose-Capillary: 120 mg/dL — ABNORMAL HIGH (ref 70–99)
Glucose-Capillary: 112 mg/dL — ABNORMAL HIGH (ref 70–99)

## 2021-11-02 NOTE — Progress Notes (Signed)
   Providing Compassionate, Quality Care - Together  NEUROSURGERY PROGRESS NOTE   S: No issues overnight  O: EXAM:  BP (!) 144/79   Pulse 70   Temp 99.1 F (37.3 C)   Resp 18   Ht '5\' 2"'$  (1.575 m)   Wt 69.5 kg   SpO2 96%   BMI 28.02 kg/m   Trach, eyes closed, pupils fixed/dilated, slightly inferior gaze No response to pain   ASSESSMENT:  46 y.o. female with    Subarachnoid hemorrhage   PLAN: -No significant change      Thank you for allowing me to participate in this patient's care.  Please do not hesitate to call with questions or concerns.   Elwin Sleight, [facility]

## 2021-11-02 NOTE — Progress Notes (Signed)
NAME:  [name], MRN:  [id number], DOB:  12/22/1975, LOS: 8 ADMISSION DATE:  10/25/2021, CONSULTATION DATE: 09/29/2021 REFERRING MD: ED physician, CHIEF COMPLAINT: Subarachnoid hemorrhage  History of Present Illness:  46 yo female former smoker was found unresponsive by her husband and EMS called.  She was making gurgling noises with her breathing.  She was found to have SVT and EMS performed DCCV.  She had GCS 4 in ER and intubated for airway protection.  Found to have large SAH and neurosurgery arranged for hospital admission.  PCCM consulted to assist with management in ICU.  Pertinent  Medical History  Hypertension, uterine fibroids Legally blind in left eye  Significant Hospital Events: Including procedures, antibiotic start and stop dates in addition to other pertinent events   9/07 presented unresponsive, hypotensive; CT head with subarachnoid hemorrhage 9/11 DDAVP give for Central DI, repeat head CT with worsening infarcts and herniation 9/12 Attempted transfer for second opinion at [facility] and [facility].  No beds at [facility] and transfer was declined at [facility] 9/13 no change in mental status.  Transfer to [facility] declined 9/14 [facility] declined transfer again.  No change.  DDAVP x 1 9/18 palliative care consulted 9/23 Na increased >> restart DDAVP and increase free water 9/25 plan for trach. Increased pressor requirement. Added midodrine  9/26 dc d5W. Weaning NE as able. On I/O cath protocol  9/27 worse renal fxn and new hyperkalemia  9/30 renal function improving 10/5: off levo 10/31/2021 plan for PEG tube 10/7 Bps elevated in AM, monitoring  Interim History / Subjective:  NAEON. Bps fluctuating but mostly controlled. Discussed repeat brain death testing now over 1 week after last spontaneous respiration observed. Husband seems resistant but will pass along to children.   Objective   Blood pressure (!) 144/79, pulse 70, temperature 99.1 F (37.3 C), resp. rate 18, height '5\' 2"'$   (1.575 m), weight 69.5 kg, SpO2 96 %.    Vent Mode: PRVC FiO2 (%):  [30 %] 30 % Set Rate:  [18 bmp] 18 bmp Vt Set:  [400 mL] 400 mL PEEP:  [5 cmH20] 5 cmH20 Plateau Pressure:  [17 cmH20-20 cmH20] 17 cmH20   Intake/Output Summary (Last 24 hours) at 11/02/2021 1030 Last data filed at 11/02/2021 0800 Gross per 24 hour  Intake 750 ml  Output 1140 ml  Net -390 ml    Filed Weights   10/28/21 0500 10/31/21 0430 11/01/21 0415  Weight: 72.6 kg 70.5 kg 69.5 kg    Examination:  General: Middle-age female who is unresponsive on no sedation HEENT: Protruding tongue,. Neuro: Negative doll's eyes, pupils fixed and dilated, no gag reflex CV: Heart sounds are regular PULM: Coarse rhonchi GI: soft, bsx4 active  Extremities: Negative warm/dry,  edema  Skin: no rashes or lesions   Resolved Hospital Problem list    Aspiration pneumonitis >> completed ABx 9/13, AKI from hypovolemia  Assessment & Plan:  Subarachnoid hemorrhage with neurologic devastation, comatose, brain compression: Multiple week hospitalization without improvement.  Reported spontaneous respirations 10/25/2021.  Otherwise no signs of brainstem reflexes. -- Supportive care per patient/family wishes -- Consider repeat brain death testing Nov 14, 2022, discussed with husband who will relay to children   Hyponatremia: Improved with DDAVP.  Likely central DI in the setting of severe brain trauma. -- Continue DDAVP -- Free water increased 10/7 -- daily BMP   Acute kidney injury: Likely hypovolemic in the setting of robust urine output in setting of central DI.  Improving. -- Free water as  needed -- Tube feeds -- minimize lab draws   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: -- No signs of bleeding -- No transfusions per patient and family wishes, only want to use family blood which is not an option -- Minimize blood draws, CBC only if concern for infection of bleeding, BMP daily foir now consider  decreasing frequency   Hyperglycemia: Sliding scale insulin  Best Practice (right click and "Reselect all SmartList Selections" daily)   Diet/type: tubefeeds and NPO w/ meds via tube  DVT prophylaxis: prophylactic heparin  GI prophylaxis: PPI Lines: Central line- RUE PICC Foley:  N/A Code Status:  full code 11/02/2021 updated family at bedside  Critical Care Time:  CRITICAL CARE Performed by: Bonna Gains Thailan Sava   Total critical care time: 31 minutes  Critical care time was exclusive of separately billable procedures and treating other patients.  Critical care was necessary to treat or prevent imminent or life-threatening deterioration.  Critical care was time spent personally by me on the following activities: development of treatment plan with patient and/or surrogate as well as nursing, discussions with consultants, evaluation of patient's response to treatment, examination of patient, obtaining history from patient or surrogate, ordering and performing treatments and interventions, ordering and review of laboratory studies, ordering and review of radiographic studies, pulse oximetry and re-evaluation of patient's condition.   Lanier Clam, MD [place] [facility] Please consult Amion 11/02/2021, 10:30 AM

## 2021-11-03 DIAGNOSIS — E232 Diabetes insipidus: Secondary | ICD-10-CM | POA: Diagnosis not present

## 2021-11-03 DIAGNOSIS — I609 Nontraumatic subarachnoid hemorrhage, unspecified: Secondary | ICD-10-CM | POA: Diagnosis not present

## 2021-11-03 DIAGNOSIS — G935 Compression of brain: Secondary | ICD-10-CM | POA: Diagnosis not present

## 2021-11-03 DIAGNOSIS — N179 Acute kidney failure, unspecified: Secondary | ICD-10-CM | POA: Diagnosis not present

## 2021-11-03 LAB — GLUCOSE, CAPILLARY
Glucose-Capillary: 121 mg/dL — ABNORMAL HIGH (ref 70–99)
Glucose-Capillary: 134 mg/dL — ABNORMAL HIGH (ref 70–99)
Glucose-Capillary: 97 mg/dL (ref 70–99)
Glucose-Capillary: 119 mg/dL — ABNORMAL HIGH (ref 70–99)
Glucose-Capillary: 113 mg/dL — ABNORMAL HIGH (ref 70–99)
Glucose-Capillary: 115 mg/dL — ABNORMAL HIGH (ref 70–99)

## 2021-11-03 LAB — BASIC METABOLIC PANEL
Anion gap: 4 — ABNORMAL LOW (ref 5–15)
BUN: 32 mg/dL — ABNORMAL HIGH (ref 6–20)
CO2: 26 mmol/L (ref 22–32)
Calcium: 8.3 mg/dL — ABNORMAL LOW (ref 8.9–10.3)
Chloride: 116 mmol/L — ABNORMAL HIGH (ref 98–111)
Creatinine, Ser: 0.87 mg/dL (ref 0.44–1.00)
GFR, Estimated: >60 mL/min (ref >60)
Glucose, Bld: 155 mg/dL — ABNORMAL HIGH (ref 70–99)
Potassium: 3.7 mmol/L (ref 3.5–5.1)
Sodium: 146 mmol/L — ABNORMAL HIGH (ref 135–145)

## 2021-11-03 MED ORDER — HYDRALAZINE HCL 20 MG/ML IJ SOLN
10.0000 mg | Freq: Four times a day (QID) | INTRAMUSCULAR | Status: DC | PRN
  Administered 2021-11-03 – 2021-11-04 (×2): 10 mg via INTRAVENOUS
  Filled 2021-11-03 (×2): qty 1

## 2021-11-03 NOTE — Progress Notes (Signed)
Patient ID: [name], female   DOB: 11/22/1975, 46 y.o.   MRN: [id number] BP (!) 142/83   Pulse 71   Temp 99.3 F (37.4 C)   Resp 18   Ht '5\' 2"'$  (1.575 m)   Wt 69.5 kg   SpO2 97%   BMI 28.02 kg/m  Comatose Fixed dilated pupils No cough, no gag HCT still very low. Family wishes that we only transfuse with their blood. Waiting for blood availability. Given iron last week.

## 2021-11-03 NOTE — Progress Notes (Signed)
eLink Physician-Brief Progress Note Patient Name: [name] DOB: [birth year] MRN: [id number]   Date of Service  11/03/2021  HPI/Events of Note  Notified of hypertension with BP 180/100, HR 73, RR 18, O2 sats 98%.   eICU Interventions  Hydralazine IV PRN ordered.  Hold midodrine.      Intervention Category Intermediate Interventions: Hypertension - evaluation and management  Elsie Lincoln 11/03/2021, 12:21 AM

## 2021-11-03 NOTE — Progress Notes (Signed)
NAME:  [name], MRN:  [id number], DOB:  [birth year], LOS: 30 ADMISSION DATE:  10/24/2021, CONSULTATION DATE: 10/24/2021 REFERRING MD: ED physician, CHIEF COMPLAINT: Subarachnoid hemorrhage  History of Present Illness:  46 yo female former smoker was found unresponsive by her husband and EMS called.  She was making gurgling noises with her breathing.  She was found to have SVT and EMS performed DCCV.  She had GCS 4 in ER and intubated for airway protection.  Found to have large SAH and neurosurgery arranged for hospital admission.  PCCM consulted to assist with management in ICU.  Pertinent  Medical History  Hypertension, uterine fibroids Legally blind in left eye  Significant Hospital Events: Including procedures, antibiotic start and stop dates in addition to other pertinent events   9/07 presented unresponsive, hypotensive; CT head with subarachnoid hemorrhage 9/11 DDAVP give for Central DI, repeat head CT with worsening infarcts and herniation 9/12 Attempted transfer for second opinion at [facility].  No beds at [facility] and transfer was declined at [facility] 9/13 no change in mental status.  Transfer to [facility] declined 9/14 [facility] declined transfer again.  No change.  DDAVP x 1 9/18 palliative care consulted 9/23 Na increased >> restart DDAVP and increase free water 9/25 plan for trach. Increased pressor requirement. Added midodrine  9/26 dc d5W. Weaning NE as able. On I/O cath protocol  9/27 worse renal fxn and new hyperkalemia  9/30 renal function improving 10/5: off levo 10/31/2021 plan for PEG tube 10/7 Bps elevated in AM, monitoring  Interim History / Subjective:  Bps fluctuating but mostly controlled.   Objective   Blood pressure (!) 97/47, pulse 66, temperature 97.7 F (36.5 C), resp. rate 18, height '5\' 2"'$  (1.575 m), weight 69.5 kg, SpO2 99 %.    Vent Mode: PRVC FiO2 (%):  [30 %] 30 % Set Rate:  [18 bmp] 18 bmp Vt Set:  [400 mL] 400 mL PEEP:  [5 cmH20] 5  cmH20 Plateau Pressure:  [15 cmH20-19 cmH20] 18 cmH20   Intake/Output Summary (Last 24 hours) at 11/03/2021 1010 Last data filed at 11/03/2021 0700 Gross per 24 hour  Intake 1450 ml  Output 635 ml  Net 815 ml   Filed Weights   10/28/21 0500 10/31/21 0430 11/01/21 0415  Weight: 72.6 kg 70.5 kg 69.5 kg    Examination:  Physical exam: General: Crtitically ill-appearing female, s/p trach HEENT: [place]/AT, eyes anicteric.  Tongue is protruding Neuro: Eyes closed, does not open, not following commands, pupils dilated and fixed, no corneal, no cough, gag or initiation of respiration.  Extensor posturing with painful stimuli Chest: Coarse breath sounds, no wheezes or rhonchi Heart: Regular rate and rhythm, no murmurs or gallops Abdomen: Soft, nontender, nondistended, bowel sounds present Skin: No rash  Resolved Hospital Problem list    Aspiration pneumonitis >> completed ABx 9/13, AKI from hypovolemia  Assessment & Plan:  Subarachnoid hemorrhage with neurologic devastation, comatose, cerebral edema with brain compression and herniation syndrome Multiple week hospitalization without improvement Patient is having extensor posturing with no brainstem reflexes Continue supportive care   Hypernatremia: Improved with DDAVP.  Likely central DI in the setting of severe brain damage Continue DDAVP Continue Free water Intermittent lab checks   Acute kidney injury: Likely hypovolemic in the setting of robust urine output in setting of central DI.  Improving. Continue DDAVP and free water flushes   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 No signs of bleeding No  transfusions per patient's family wishes, only want to use family blood which is not an option Minimize blood draws, CBC only if concern for infection of bleeding, BMP daily foir now consider decreasing frequency   Hyperglycemia Blood sugars better controlled Continue sliding scale with CBG goal  140-180  Best Practice (right click and "Reselect all SmartList Selections" daily)   Diet/type: tubefeeds and NPO w/ meds via tube  DVT prophylaxis: prophylactic heparin  GI prophylaxis: PPI Lines: Central line- RUE PICC Foley:  N/A Code Status:  full code 11/02/2021 updated family at bedside   Total critical care time: 30 minutes  Performed by: [place] care time was exclusive of separately billable procedures and treating other patients.   Critical care was necessary to treat or prevent imminent or life-threatening deterioration.   Critical care was time spent personally by me on the following activities: development of treatment plan with patient and/or surrogate as well as nursing, discussions with consultants, evaluation of patient's response to treatment, examination of patient, obtaining history from patient or surrogate, ordering and performing treatments and interventions, ordering and review of laboratory studies, ordering and review of radiographic studies, pulse oximetry and re-evaluation of patient's condition.   Jacky Kindle, MD [place] [facility] See Amion for pager If no response to pager, please call [phone] until 7pm After 7pm, Please call E-link [phone]

## 2021-11-04 DIAGNOSIS — G935 Compression of brain: Secondary | ICD-10-CM | POA: Diagnosis not present

## 2021-11-04 DIAGNOSIS — N179 Acute kidney failure, unspecified: Secondary | ICD-10-CM | POA: Diagnosis not present

## 2021-11-04 DIAGNOSIS — A498 Other bacterial infections of unspecified site: Secondary | ICD-10-CM | POA: Diagnosis not present

## 2021-11-04 DIAGNOSIS — I609 Nontraumatic subarachnoid hemorrhage, unspecified: Secondary | ICD-10-CM | POA: Diagnosis not present

## 2021-11-04 LAB — BASIC METABOLIC PANEL
Anion gap: 7 (ref 5–15)
BUN: 28 mg/dL — ABNORMAL HIGH (ref 6–20)
CO2: 27 mmol/L (ref 22–32)
Calcium: 8.3 mg/dL — ABNORMAL LOW (ref 8.9–10.3)
Chloride: 114 mmol/L — ABNORMAL HIGH (ref 98–111)
Creatinine, Ser: 0.78 mg/dL (ref 0.44–1.00)
GFR, Estimated: >60 mL/min (ref >60)
Glucose, Bld: 140 mg/dL — ABNORMAL HIGH (ref 70–99)
Potassium: 4.2 mmol/L (ref 3.5–5.1)
Sodium: 148 mmol/L — ABNORMAL HIGH (ref 135–145)

## 2021-11-04 LAB — GLUCOSE, CAPILLARY
Glucose-Capillary: 103 mg/dL — ABNORMAL HIGH (ref 70–99)
Glucose-Capillary: 108 mg/dL — ABNORMAL HIGH (ref 70–99)
Glucose-Capillary: 112 mg/dL — ABNORMAL HIGH (ref 70–99)
Glucose-Capillary: 126 mg/dL — ABNORMAL HIGH (ref 70–99)
Glucose-Capillary: 72 mg/dL (ref 70–99)
Glucose-Capillary: 91 mg/dL (ref 70–99)

## 2021-11-04 NOTE — Progress Notes (Signed)
Patient ID: [name], female   DOB: [birth year], 46 y.o.   MRN: [id number] BP (!) 94/57   Pulse 65   Temp 98.2 F (36.8 C)   Resp 18   Ht '5\' 2"'$  (1.575 m)   Wt 69.5 kg   SpO2 96%   BMI 28.02 kg/m  Comatose.  No neurological changes Awaiting peg, do not know what hgb needs to be. Last value recorded is 6.6

## 2021-11-04 NOTE — Progress Notes (Signed)
NAME:  [name], MRN:  [id number], DOB:  22-May-1975, LOS: 42 ADMISSION DATE:  10/13/2021, CONSULTATION DATE: 10/08/2021 REFERRING MD: ED physician, CHIEF COMPLAINT: Subarachnoid hemorrhage  History of Present Illness:  46 yo female former smoker was found unresponsive by her husband and EMS called.  She was making gurgling noises with her breathing.  She was found to have SVT and EMS performed DCCV.  She had GCS 4 in ER and intubated for airway protection.  Found to have large SAH and neurosurgery arranged for hospital admission.  PCCM consulted to assist with management in ICU.  Pertinent  Medical History  Hypertension, uterine fibroids Legally blind in left eye  Significant Hospital Events: Including procedures, antibiotic start and stop dates in addition to other pertinent events   9/07 presented unresponsive, hypotensive; CT head with subarachnoid hemorrhage 9/11 DDAVP give for Central DI, repeat head CT with worsening infarcts and herniation 9/12 Attempted transfer for second opinion at [facility].  No beds at [facility] and transfer was declined at [facility] 9/13 no change in mental status.  Transfer to [facility] declined 9/14 [facility] declined transfer again.  No change.  DDAVP x 1 9/18 palliative care consulted 9/23 Na increased >> restart DDAVP and increase free water 9/25 plan for trach. Increased pressor requirement. Added midodrine  9/26 dc d5W. Weaning NE as able. On I/O cath protocol  9/27 worse renal fxn and new hyperkalemia  9/30 renal function improving 10/5: off levo 10/31/2021 plan for PEG tube 10/7 Bps elevated in AM, monitoring  Interim History / Subjective:  No overnight issues Patient remained afebrile Blood pressure has been stable  Objective   Blood pressure 116/68, pulse 69, temperature 98.8 F (37.1 C), resp. rate 18, height '5\' 2"'$  (1.575 m), weight 69.5 kg, SpO2 100 %.    Vent Mode: PRVC FiO2 (%):  [30 %] 30 % Set Rate:  [18 bmp] 18 bmp Vt Set:  [400  mL] 400 mL PEEP:  [5 cmH20] 5 cmH20 Plateau Pressure:  [18 cmH20-20 cmH20] 18 cmH20   Intake/Output Summary (Last 24 hours) at 11/04/2021 0951 Last data filed at 11/04/2021 0806 Gross per 24 hour  Intake 2850 ml  Output 1450 ml  Net 1400 ml   Filed Weights   10/28/21 0500 10/31/21 0430 11/01/21 0415  Weight: 72.6 kg 70.5 kg 69.5 kg    Examination:  Physical exam: General: Crtitically ill-appearing female, s/p trach HEENT: [place]/AT, eyes anicteric.  Large tongue is protruding Neuro: Eyes closed, does not open, not following commands, pupils dilated and fixed, no corneal, no cough, gag or initiation of respiration.  Extensor posturing with painful stimuli Chest: Coarse breath sounds, no wheezes or rhonchi Heart: Regular rate and rhythm, no murmurs or gallops Abdomen: Soft, nontender, nondistended, bowel sounds present Skin: No rash  Resolved Hospital Problem list    Aspiration pneumonitis >> completed ABx 9/13, AKI from hypovolemia  Assessment & Plan:  Subarachnoid hemorrhage with neurologic devastation, comatose, cerebral edema with brain compression and herniation syndrome No neurological recovery Patient is having extensor posturing with no brainstem reflexes Continue supportive care Continue goals of care discussion   Hypernatremia due to central 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 Serum sodium is improving, currently at 148   Acute kidney injury: Likely hypovolemic in the setting of robust urine output in setting of central DI, resolved Avoid nephrotoxic agent Monitor intake and output   Anemia of chronic disease and iron deficiency anemia due  to phlebotomy No signs of bleeding No transfusions per patient's family wishes, only want to use family blood which is not an option Minimize blood draws, CBC only if concern for infection of bleeding, BMP daily foir now consider decreasing frequency   Hyperglycemia Blood sugars better  controlled Continue sliding scale with CBG goal 140-180  Best Practice (right click and "Reselect all SmartList Selections" daily)   Diet/type: tubefeeds and NPO w/ meds via tube  DVT prophylaxis: prophylactic heparin  GI prophylaxis: PPI Lines: Central line- RUE PICC Foley:  N/A Code Status:  full code 11/04/2021 updated family at bedside   Total critical care time: 30 minutes  Performed by: Jacky Kindle   Critical care time was exclusive of separately billable procedures and treating other patients.   Critical care was necessary to treat or prevent imminent or life-threatening deterioration.   Critical care was time spent personally by me on the following activities: development of treatment plan with patient and/or surrogate as well as nursing, discussions with consultants, evaluation of patient's response to treatment, examination of patient, obtaining history from patient or surrogate, ordering and performing treatments and interventions, ordering and review of laboratory studies, ordering and review of radiographic studies, pulse oximetry and re-evaluation of patient's condition.   Jacky Kindle, MD [place] [facility] See Amion for pager If no response to pager, please call [phone] until 7pm After 7pm, Please call E-link [phone]

## 2021-11-05 DIAGNOSIS — I609 Nontraumatic subarachnoid hemorrhage, unspecified: Secondary | ICD-10-CM | POA: Diagnosis not present

## 2021-11-05 DIAGNOSIS — G935 Compression of brain: Secondary | ICD-10-CM | POA: Diagnosis not present

## 2021-11-05 DIAGNOSIS — E875 Hyperkalemia: Secondary | ICD-10-CM | POA: Diagnosis not present

## 2021-11-05 LAB — GLUCOSE, CAPILLARY
Glucose-Capillary: 67 mg/dL — ABNORMAL LOW (ref 70–99)
Glucose-Capillary: 135 mg/dL — ABNORMAL HIGH (ref 70–99)
Glucose-Capillary: 93 mg/dL (ref 70–99)
Glucose-Capillary: 74 mg/dL (ref 70–99)
Glucose-Capillary: 107 mg/dL — ABNORMAL HIGH (ref 70–99)
Glucose-Capillary: 90 mg/dL (ref 70–99)
Glucose-Capillary: 106 mg/dL — ABNORMAL HIGH (ref 70–99)
Glucose-Capillary: 113 mg/dL — ABNORMAL HIGH (ref 70–99)

## 2021-11-05 MED ORDER — SODIUM CHLORIDE 0.9 % IV SOLN: 250.0000 mL | INTRAVENOUS | Status: DC

## 2021-11-05 MED ORDER — ORAL CARE MOUTH RINSE: 15.0000 mL | OROMUCOSAL | Status: DC | PRN

## 2021-11-05 MED ORDER — DEXTROSE 50 % IV SOLN
12.5000 g | INTRAVENOUS | Status: AC
  Administered 2021-11-05: 12.5 g via INTRAVENOUS
  Filled 2021-11-05: qty 50

## 2021-11-05 MED ORDER — NOREPINEPHRINE 4 MG/250ML-% IV SOLN
0.0000 ug/min | INTRAVENOUS | Status: DC
  Administered 2021-11-05: 2 ug/min via INTRAVENOUS
  Administered 2021-11-06: 7 ug/min via INTRAVENOUS
  Administered 2021-11-06: 4 ug/min via INTRAVENOUS
  Administered 2021-11-07: 13 ug/min via INTRAVENOUS
  Administered 2021-11-07: 10 ug/min via INTRAVENOUS
  Administered 2021-11-07: 15 ug/min via INTRAVENOUS
  Administered 2021-11-07: 13 ug/min via INTRAVENOUS
  Administered 2021-11-08: 14 ug/min via INTRAVENOUS
  Administered 2021-11-08: 12 ug/min via INTRAVENOUS
  Administered 2021-11-08: 9 ug/min via INTRAVENOUS
  Administered 2021-11-09 (×4): 10 ug/min via INTRAVENOUS
  Administered 2021-11-10 (×2): 8 ug/min via INTRAVENOUS
  Administered 2021-11-10: 9 ug/min via INTRAVENOUS
  Administered 2021-11-11: 11 ug/min via INTRAVENOUS
  Administered 2021-11-11: 12 ug/min via INTRAVENOUS
  Administered 2021-11-11: 11 ug/min via INTRAVENOUS
  Administered 2021-11-12 (×2): 7 ug/min via INTRAVENOUS
  Administered 2021-11-12: 6 ug/min via INTRAVENOUS
  Administered 2021-11-13: 4 ug/min via INTRAVENOUS
  Administered 2021-11-14: 5 ug/min via INTRAVENOUS
  Administered 2021-11-15: 14 ug/min via INTRAVENOUS
  Administered 2021-11-15: 9 ug/min via INTRAVENOUS
  Administered 2021-11-15: 18 ug/min via INTRAVENOUS
  Administered 2021-11-15: 7 ug/min via INTRAVENOUS
  Administered 2021-11-16: 20 ug/min via INTRAVENOUS
  Administered 2021-11-16: 12 ug/min via INTRAVENOUS
  Administered 2021-11-16: 15 ug/min via INTRAVENOUS
  Administered 2021-11-16: 22 ug/min via INTRAVENOUS
  Administered 2021-11-16: 18 ug/min via INTRAVENOUS
  Administered 2021-11-17: 14 ug/min via INTRAVENOUS
  Administered 2021-11-17: 11 ug/min via INTRAVENOUS
  Administered 2021-11-17: 6 ug/min via INTRAVENOUS
  Administered 2021-11-18: 3 ug/min via INTRAVENOUS
  Administered 2021-11-19: 2 ug/min via INTRAVENOUS
  Filled 2021-11-05 (×42): qty 250

## 2021-11-05 MED ORDER — ORAL CARE MOUTH RINSE
15.0000 mL | OROMUCOSAL | Status: DC
  Administered 2021-11-05 – 2022-01-01 (×683): 15 mL via OROMUCOSAL

## 2021-11-05 NOTE — Progress Notes (Signed)
eLink Physician-Brief Progress Note Patient Name: [name] DOB: 28-Jan-1975 MRN: [id number]   Date of Service  11/05/2021  HPI/Events of Note   77/45  eICU Interventions  Peripheral Levophed gtt ordered.        Kerry Kass Aurore Redinger 11/05/2021, 6:42 AM

## 2021-11-05 NOTE — Progress Notes (Signed)
NAME:  [name], MRN:  [id number], DOB:  [birth year], LOS: 71 ADMISSION DATE:  10/25/2021, CONSULTATION DATE: 10/12/2021 REFERRING MD: ED physician, CHIEF COMPLAINT: Subarachnoid hemorrhage  History of Present Illness:  46 yo female former smoker was found unresponsive by her husband and EMS called.  She was making gurgling noises with her breathing.  She was found to have SVT and EMS performed DCCV.  She had GCS 4 in ER and intubated for airway protection.  Found to have large SAH and neurosurgery arranged for hospital admission.  PCCM consulted to assist with management in ICU.  Pertinent  Medical History  Hypertension, uterine fibroids Legally blind in left eye  Significant Hospital Events: Including procedures, antibiotic start and stop dates in addition to other pertinent events   9/07 presented unresponsive, hypotensive; CT head with subarachnoid hemorrhage 9/11 DDAVP give for Central DI, repeat head CT with worsening infarcts and herniation 9/12 Attempted transfer for second opinion at [facility].  No beds at [facility] and transfer was declined at [facility] 9/13 no change in mental status.  Transfer to [facility] declined 9/14 [facility] declined transfer again.  No change.  DDAVP x 1 9/18 palliative care consulted 9/23 Na increased >> restart DDAVP and increase free water 9/25 plan for trach. Increased pressor requirement. Added midodrine  9/26 dc d5W. Weaning NE as able. On I/O cath protocol  9/27 worse renal fxn and new hyperkalemia  9/30 renal function improving 10/5: off levo 10/31/2021 plan for PEG tube 10/7 Bps elevated in AM, monitoring  Interim History / Subjective:  Patient became hypotensive overnight, started on low-dose Levophed Also became hypoglycemic requiring D50   Objective   Blood pressure 120/81, pulse 71, temperature 99.1 F (37.3 C), temperature source Rectal, resp. rate 18, height '5\' 2"'$  (1.575 m), weight 73.4 kg, SpO2 95 %.    Vent Mode: PRVC FiO2 (%):   [30 %] 30 % Set Rate:  [18 bmp] 18 bmp Vt Set:  [400 mL] 400 mL PEEP:  [5 cmH20] 5 cmH20 Plateau Pressure:  [18 cmH20-21 cmH20] 20 cmH20   Intake/Output Summary (Last 24 hours) at 11/05/2021 0842 Last data filed at 11/05/2021 0800 Gross per 24 hour  Intake 1228.8 ml  Output 710 ml  Net 518.8 ml   Filed Weights   10/31/21 0430 11/01/21 0415 11/05/21 0500  Weight: 70.5 kg 69.5 kg 73.4 kg    Examination: Physical exam: General: Crtitically ill-appearing female, s/p trach HEENT: [place]/AT, eyes anicteric.  Large tongue is protruding Neuro: Eyes closed, does not open, not following commands, pupils dilated and fixed, no corneal, no cough, gag or initiation of respiration.  Extensor posturing with painful stimuli Chest: B/l Rhonchi all over, no wheezes or rhonchi Heart: Regular rate and rhythm, no murmurs or gallops Abdomen: Soft, nontender, nondistended, bowel sounds present Skin: No rash  Resolved Hospital Problem list    Aspiration pneumonitis >> completed ABx 9/13, AKI from hypovolemia Acute kidney injury due to hypovolemia  Assessment & Plan:  Subarachnoid hemorrhage with neurologic devastation, comatose, cerebral edema with brain compression and herniation syndrome No neurological recovery Patient is having extensor posturing with no brainstem reflexes Continue supportive care Continue goals of care discussion   Hypernatremia due to central 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 Serum sodium is stable We are minimizing labs check considering patient family does not accept blood transfusion and she is anemic   Anemia of chronic disease and iron deficiency anemia due to  phlebotomy No signs of bleeding No transfusions per patient's family wishes, only want to use family blood which is not an option Minimize blood draws, CBC only if concern for infection of bleeding, BMP daily foir now consider decreasing frequency   Steroid-induced  hyperglycemia, resolved Steroids stopped yesterday, patient had hypoglycemic event last night Stop tube feed coverage and sliding scale Monitor fingerstick   Best Practice (right click and "Reselect all SmartList Selections" daily)   Diet/type: tubefeeds and NPO w/ meds via tube  DVT prophylaxis: prophylactic heparin  GI prophylaxis: PPI Lines: Central line- RUE PICC Foley:  N/A Code Status:  full code 11/05/2021 updated family at bedside     Jacky Kindle, MD [place] [facility] See Amion for pager If no response to pager, please call [phone] until 7pm After 7pm, Please call E-link [phone]

## 2021-11-05 NOTE — Procedures (Incomplete)
Intubation Procedure Note  RHENA GLACE  [id number]  12-30-1975  Date:11/05/21  Time:2:37 AM   Provider Performing:Guerin Lashomb A Shyloh Krinke    Procedure: Intubation (31500)  Indication(s) Respiratory Failure  Consent {Consent:304960230}   Anesthesia {Anesthesia QIHK:742595638}   Time Out Verified patient identification, verified procedure, site/side was marked, verified correct patient position, special equipment/implants available, medications/allergies/relevant history reviewed, required imaging and test results available.   Sterile Technique Usual hand hygeine, masks, and gloves were used   Procedure Description Patient positioned in bed supine.  Sedation given as noted above.  Patient was intubated with endotracheal tube using {Intubation assist:304960237::"Glidescope"}.  View was {Intubation VFIE:332951884::"ZYSAY 1 full glottis "}.  Number of attempts was {Attempts:304960239::"1"}.  Colorimetric CO2 detector was consistent with tracheal placement.   Complications/Tolerance {TKZSWFUXNATFT:73220::"URKY; patient tolerated the procedure well."} Chest X-ray is ordered to verify placement.   EBL {EBL:304960236::"Minimal"}   Specimen(s) None

## 2021-11-05 NOTE — Progress Notes (Signed)
Multiple in and out catheters for urinary retention. Indwelling foley placed.

## 2021-11-05 NOTE — Progress Notes (Signed)
Patient ID: [name], female   DOB: 1975-04-17, 46 y.o.   MRN: [id number] BP 105/67   Pulse 71   Temp 98.6 F (37 C)   Resp 18   Ht '5\' 2"'$  (1.575 m)   Wt 73.4 kg   SpO2 95%   BMI 29.60 kg/m  Comatose. Pupils fixed, dilated. Is not extensor posturing, no coordinated response to noxious stimuli Prognosis grave.  Pressor restarted this am due to tenuous blood  pressure.  There is no improvement since admission.

## 2021-11-05 NOTE — TOC Progression Note (Signed)
Transition of Care [facility]) - Progression Note    Patient Details  Name: [name] MRN: [id number] Date of Birth: 1975-03-24  Transition of Care [facility] CM/SW Contact  Ella Bodo, RN Phone Number: 11/05/2021, 11:00am  Clinical Narrative:    46 yo female former smoker was found unresponsive by her husband and EMS called.  She was making gurgling noises with her breathing.  She was found to have SVT and EMS performed DCCV.  She had GCS 4 in ER and intubated for airway protection.  Found to have large SAH and neurosurgery arranged for hospital admission.  Patient with poor neurological prognosis with continued vent and pressors.  CCM requested LTAC screening for eligibility; [facility] and [facility] both reviewed.  They both state that patient's neuro status and poor prognosis makes her ineligible for LTAC to accept.  CCM made aware.   Addendum:  1520pm Received call from Gar Gibbon with [employer], requesting clinical update; returned call-left message on voicemail.     Barriers to Discharge: Continued Medical Work up  Expected Discharge Plan and Services     Discharge Planning Services: CM Consult   Living arrangements for the past 2 months: Single Family Home                                       Social Determinants of Health (SDOH) Interventions    Readmission Risk Interventions     No data to display         Reinaldo Raddle, RN, BSN  Trauma/Neuro ICU Case Manager [phone]

## 2021-11-05 NOTE — Progress Notes (Signed)
BP trending low @ 1900, [facility] careteam was contacted and gave a verbal order to give midodrine order (due at 2200) early. Dose was given @ 1931. BP back in range at 2030  BP began to trend low @ 0330; [facility] careteam was contacted and this RN was told to wait for midodrine dose due @ 0600. Midodrine was given @ 0502.

## 2021-11-06 DIAGNOSIS — I609 Nontraumatic subarachnoid hemorrhage, unspecified: Secondary | ICD-10-CM | POA: Diagnosis not present

## 2021-11-06 DIAGNOSIS — G935 Compression of brain: Secondary | ICD-10-CM | POA: Diagnosis not present

## 2021-11-06 LAB — GLUCOSE, CAPILLARY
Glucose-Capillary: 104 mg/dL — ABNORMAL HIGH (ref 70–99)
Glucose-Capillary: 129 mg/dL — ABNORMAL HIGH (ref 70–99)
Glucose-Capillary: 108 mg/dL — ABNORMAL HIGH (ref 70–99)
Glucose-Capillary: 128 mg/dL — ABNORMAL HIGH (ref 70–99)
Glucose-Capillary: 89 mg/dL (ref 70–99)
Glucose-Capillary: 127 mg/dL — ABNORMAL HIGH (ref 70–99)

## 2021-11-06 MED ORDER — DESMOPRESSIN ACETATE 4 MCG/ML IJ SOLN
0.5000 ug | Freq: Every day | INTRAMUSCULAR | Status: DC
  Administered 2021-11-06 – 2021-11-11 (×6): 0.52 ug via INTRAVENOUS
  Filled 2021-11-06 (×6): qty 1

## 2021-11-06 NOTE — Progress Notes (Signed)
Nutrition Follow-up  DOCUMENTATION CODES:   Not applicable  INTERVENTION:   Continue TF via Cortrak tube: Osmolite 1.5 at 50 ml/h (1200 ml per day) Prosource TF20 60 ml daily   Provides 1880 kcal, 95 gm protein, 912 ml free water daily    NUTRITION DIAGNOSIS:   Inadequate oral intake related to inability to eat as evidenced by NPO status. Ongoing  GOAL:   Patient will meet greater than or equal to 90% of their needs Goal met via TF  MONITOR:   TF tolerance  REASON FOR ASSESSMENT:   Consult Enteral/tube feeding initiation and management  ASSESSMENT:   Pt with PMH of HTN and legally blind in L eye admitted after being found down by husband with CPR. Per CT pt with large SAH.  Pt discussed during ICU rounds and with RN and MD.  Currently on one pressor. Not a candidate for RRT.   9/26 cortrak placed; tip in distal antrum of the stomach   Medications: DDAVP, protonix, miralax, senokot-s  Levophed @ 5 mcg    Labs: sodium 148 Hgb: 6.6 (10/7) CBG's: 74-129  UOP: 2300 mL x24 hours I/O's: +8.8 L since admission  Diet Order:   Diet Order     None       EDUCATION NEEDS:   No education needs have been identified at this time  Skin:  Skin Assessment:  (L thigh skin tear)  Last BM:  10/12  Height:   Ht Readings from Last 1 Encounters:  10/07/21 5' 2"  (1.575 m)    Weight:   Wt Readings from Last 1 Encounters:  11/06/21 70.5 kg   BMI:  Body mass index is 28.43 kg/m.  Estimated Nutritional Needs:   Kcal:  1700-1900  Protein:  85-100 grams  Fluid:  >1.7 L/day  Lockie Pares., RD, LDN, CNSC See AMiON for contact information

## 2021-11-06 NOTE — Progress Notes (Signed)
Patient ID: [name], female   DOB: [birth year], 46 y.o.   MRN: [id number] BP 104/69   Pulse 78   Temp 99 F (37.2 C)   Resp 18   Ht '5\' 2"'$  (1.575 m)   Wt 70.5 kg   SpO2 98%   BMI 28.43 kg/m  No neurological changes Family working on donating blood.

## 2021-11-06 NOTE — Progress Notes (Signed)
NAME:  JAKHIA BUXTON, MRN:  [id number], DOB:  1975-06-01, LOS: 89 ADMISSION DATE:  10/05/2021, CONSULTATION DATE: 10/07/2021 REFERRING MD: ED physician, CHIEF COMPLAINT: Subarachnoid hemorrhage  History of Present Illness:  46 yo female former smoker was found unresponsive by her husband and EMS called.  She was making gurgling noises with her breathing.  She was found to have SVT and EMS performed DCCV.  She had GCS 4 in ER and intubated for airway protection.  Found to have large SAH and neurosurgery arranged for hospital admission.  PCCM consulted to assist with management in ICU.  Pertinent  Medical History  Hypertension, uterine fibroids Legally blind in left eye  Significant Hospital Events: Including procedures, antibiotic start and stop dates in addition to other pertinent events   9/07 presented unresponsive, hypotensive; CT head with subarachnoid hemorrhage 9/11 DDAVP give for Central DI, repeat head CT with worsening infarcts and herniation 9/12 Attempted transfer for second opinion at [facility] and [facility].  No beds at [facility] and transfer was declined at [facility] 9/13 no change in mental status.  Transfer to [facility] declined 9/14 [facility] declined transfer again.  No change.  DDAVP x 1 9/18 palliative care consulted 9/23 Na increased >> restart DDAVP and increase free water 9/25 plan for trach. Increased pressor requirement. Added midodrine  9/26 dc d5W. Weaning NE as able. On I/O cath protocol  9/27 worse renal fxn and new hyperkalemia  9/30 renal function improving 10/5: off levo 10/31/2021 plan for PEG tube 10/7 Bps elevated in AM, monitoring  Interim History / Subjective:  Patient continues remain hypotensive, requiring Levophed She was attending urine, Foley catheter was placed, urine specific gravity is low   Objective   Blood pressure 105/73, pulse 68, temperature 97.7 F (36.5 C), resp. rate 18, height '5\' 2"'$  (8.413 m), weight 70.5 kg, SpO2 100 %.    Vent Mode:  PRVC FiO2 (%):  [30 %] 30 % Set Rate:  [18 bmp] 18 bmp Vt Set:  [400 mL] 400 mL PEEP:  [5 cmH20] 5 cmH20 Plateau Pressure:  [19 cmH20-21 cmH20] 21 cmH20   Intake/Output Summary (Last 24 hours) at 11/06/2021 2440 Last data filed at 11/06/2021 0600 Gross per 24 hour  Intake 1560.2 ml  Output 2300 ml  Net -739.8 ml   Filed Weights   11/01/21 0415 11/05/21 0500 11/06/21 0500  Weight: 69.5 kg 73.4 kg 70.5 kg    Examination: Physical exam: General: Crtitically ill-appearing female, s/p trach HEENT: [place]/AT, eyes anicteric.  Large tongue is protruding Neuro: Eyes closed, does not open, not following commands, pupils dilated and fixed, no corneal, no cough, gag or initiation of respiration.  Extensor posturing with painful stimuli and movement of head Chest: Clear to auscultation bilaterally, no wheezes or rhonchi Heart: Regular rate and rhythm, no murmurs or gallops Abdomen: Soft, nontender, nondistended, bowel sounds present Skin: No rash  Resolved Hospital Problem list    Aspiration pneumonitis >> completed ABx 9/13, AKI from hypovolemia Acute kidney injury due to hypovolemia Steroid-induced hyperglycemia  Assessment & Plan:  Subarachnoid hemorrhage with neurologic devastation, comatose, cerebral edema with brain compression and herniation syndrome No neurological recovery Patient is having extensor posturing with no brainstem reflexes Continue supportive care Continue goals of care discussion   Hypernatremia due to central 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 Serum sodium is stable We are minimizing labs check considering patient family does not accept blood transfusion and she is anemic Patient started making  more urine, high risk because of gravity is low Will restart DDAVP Hydrocortisone was stopped as patient not in septic shock   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 No signs of bleeding Patient  family is still deciding if they would like her to be transfused as her hemoglobin is less than 7 Minimize blood draws, CBC only if concern for infection of bleeding, BMP daily foir now consider decreasing frequency  Best Practice (right click and "Reselect all SmartList Selections" daily)   Diet/type: tubefeeds and NPO w/ meds via tube  DVT prophylaxis: prophylactic heparin  GI prophylaxis: PPI Lines: Central line- RUE PICC Foley:  N/A Code Status:  full code 11/05/2021 updated family at bedside     Jacky Kindle, MD [place] [facility] See Amion for pager If no response to pager, please call [phone] until 7pm After 7pm, Please call E-link [phone]

## 2021-11-07 DIAGNOSIS — E875 Hyperkalemia: Secondary | ICD-10-CM | POA: Diagnosis not present

## 2021-11-07 DIAGNOSIS — N179 Acute kidney failure, unspecified: Secondary | ICD-10-CM | POA: Diagnosis not present

## 2021-11-07 DIAGNOSIS — I609 Nontraumatic subarachnoid hemorrhage, unspecified: Secondary | ICD-10-CM | POA: Diagnosis not present

## 2021-11-07 DIAGNOSIS — G935 Compression of brain: Secondary | ICD-10-CM | POA: Diagnosis not present

## 2021-11-07 LAB — GLUCOSE, CAPILLARY
Glucose-Capillary: 129 mg/dL — ABNORMAL HIGH (ref 70–99)
Glucose-Capillary: 140 mg/dL — ABNORMAL HIGH (ref 70–99)
Glucose-Capillary: 126 mg/dL — ABNORMAL HIGH (ref 70–99)
Glucose-Capillary: 146 mg/dL — ABNORMAL HIGH (ref 70–99)
Glucose-Capillary: 134 mg/dL — ABNORMAL HIGH (ref 70–99)
Glucose-Capillary: 114 mg/dL — ABNORMAL HIGH (ref 70–99)

## 2021-11-07 NOTE — Progress Notes (Signed)
Patient ID: [name], female   DOB: 1975-06-30, 46 y.o.   MRN: [id number] BP 99/68   Pulse 85   Temp 98.6 F (37 C)   Resp 18   Ht '5\' 2"'$  (1.575 m)   Wt 70.5 kg   SpO2 96%   BMI 28.43 kg/m  Comatose, no neurological change No improvement Prognosis has not changed unfortunately.

## 2021-11-07 NOTE — Progress Notes (Signed)
NAME:  [name], MRN:  [id number], DOB:  September 01, 1975, LOS: 81 ADMISSION DATE:  10/07/2021, CONSULTATION DATE: 09/29/2021 REFERRING MD: ED physician, CHIEF COMPLAINT: Subarachnoid hemorrhage  History of Present Illness:  46 yo female former smoker was found unresponsive by her husband and EMS called.  She was making gurgling noises with her breathing.  She was found to have SVT and EMS performed DCCV.  She had GCS 4 in ER and intubated for airway protection.  Found to have large SAH and neurosurgery arranged for hospital admission.  PCCM consulted to assist with management in ICU.  Pertinent  Medical History  Hypertension, uterine fibroids Legally blind in left eye  Significant Hospital Events: Including procedures, antibiotic start and stop dates in addition to other pertinent events   9/07 presented unresponsive, hypotensive; CT head with subarachnoid hemorrhage 9/11 DDAVP give for Central DI, repeat head CT with worsening infarcts and herniation 9/12 Attempted transfer for second opinion at [facility] and [facility].  No beds at [facility] and transfer was declined at [facility] 9/13 no change in mental status.  Transfer to [facility] declined 9/14 [facility] declined transfer again.  No change.  DDAVP x 1 9/18 palliative care consulted 9/23 Na increased >> restart DDAVP and increase free water 9/25 plan for trach. Increased pressor requirement. Added midodrine  9/26 dc d5W. Weaning NE as able. On I/O cath protocol  9/27 worse renal fxn and new hyperkalemia  9/30 renal function improving 10/5: off levo 10/31/2021 plan for PEG tube 10/7 Bps elevated in AM, monitoring  Interim History / Subjective:  Patient continues remain hypotensive, now with increasing Levophed requirement Remained afebrile   Objective   Blood pressure 99/74, pulse 82, temperature 99.1 F (37.3 C), resp. rate 18, height '5\' 2"'$  (1.575 m), weight 70.5 kg, SpO2 95 %.    Vent Mode: PRVC FiO2 (%):  [40 %] 40 % Set Rate:  [18 bmp] 18  bmp Vt Set:  [400 mL] 400 mL PEEP:  [5 cmH20] 5 cmH20 Plateau Pressure:  [17 cmH20-21 cmH20] 21 cmH20   Intake/Output Summary (Last 24 hours) at 11/07/2021 0959 Last data filed at 11/07/2021 0930 Gross per 24 hour  Intake 1868.07 ml  Output 5025 ml  Net -3156.93 ml   Filed Weights   11/05/21 0500 11/06/21 0500 11/07/21 0515  Weight: 73.4 kg 70.5 kg 70.5 kg    Examination: Physical exam: General: Crtitically ill-appearing female, s/p trach HEENT: [place]/AT, eyes anicteric.  Large tongue is protruding Neuro: Eyes closed, does not open, not following commands, pupils dilated and fixed, no corneal, no cough, gag or initiation of respiration.  Extensor posturing with painful stimuli and movement of head Chest: Bilateral rhonchi all over, no wheezes or crackles Heart: Regular rate and rhythm, no murmurs or gallops Abdomen: Soft, nontender, nondistended, bowel sounds present Skin: No rash  Resolved Hospital Problem list    Aspiration pneumonitis >> completed ABx 9/13, AKI from hypovolemia Acute kidney injury due to hypovolemia Steroid-induced hyperglycemia  Assessment & Plan:  Subarachnoid hemorrhage with neurologic devastation, comatose, cerebral edema with brain compression and herniation syndrome No neurological recovery No change in neurological exam with absent brainstem reflexes Continue supportive care Goals of care discussion ongoing   Hypernatremia due to central 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 Continue free water flushes We are minimizing labs check considering patient family does not accept blood transfusion and she is anemic Continue DDAVP  Hypotension due to hypovolemia caused by central DI  Patient remained hypotensive despite being on DDAVP She is making large amount of urine due to extensive brain damage Continue Levophed with map goal 65 Currently at 10 mics   Anemia of chronic disease and iron deficiency anemia due to  phlebotomy No signs of bleeding Patient family is still deciding if they would like her to be transfused as her hemoglobin is less than 7 Minimize blood draws, CBC only if concern for infection of bleeding, BMP daily foir now consider decreasing frequency  Best Practice (right click and "Reselect all SmartList Selections" daily)   Diet/type: tubefeeds and NPO w/ meds via tube  DVT prophylaxis: prophylactic heparin  GI prophylaxis: PPI Lines: Central line- RUE PICC Foley:  N/A Code Status:  full code 11/05/2021 updated family at bedside     Total critical care time: 32 minutes  Performed by: Jacky Kindle   Critical care time was exclusive of separately billable procedures and treating other patients.   Critical care was necessary to treat or prevent imminent or life-threatening deterioration.   Critical care was time spent personally by me on the following activities: development of treatment plan with patient and/or surrogate as well as nursing, discussions with consultants, evaluation of patient's response to treatment, examination of patient, obtaining history from patient or surrogate, ordering and performing treatments and interventions, ordering and review of laboratory studies, ordering and review of radiographic studies, pulse oximetry and re-evaluation of patient's condition.   Jacky Kindle, MD [place] [facility] See Amion for pager If no response to pager, please call [phone] until 7pm After 7pm, Please call E-link [phone]

## 2021-11-07 NOTE — Progress Notes (Signed)
Pt noted to have SBP less than 90 and MAP less than 65 after pt being turned and rectal repositioned. Levo gtt titrated as ordered. Pt's husband at bedside and my interventions and possible reasons for her BP and MAP dropping explained to her husband. He expressed an understanding. Will conti to monitor.

## 2021-11-08 DIAGNOSIS — I609 Nontraumatic subarachnoid hemorrhage, unspecified: Secondary | ICD-10-CM | POA: Diagnosis not present

## 2021-11-08 DIAGNOSIS — G935 Compression of brain: Secondary | ICD-10-CM | POA: Diagnosis not present

## 2021-11-08 DIAGNOSIS — E232 Diabetes insipidus: Secondary | ICD-10-CM | POA: Diagnosis not present

## 2021-11-08 LAB — GLUCOSE, CAPILLARY
Glucose-Capillary: 107 mg/dL — ABNORMAL HIGH (ref 70–99)
Glucose-Capillary: 118 mg/dL — ABNORMAL HIGH (ref 70–99)
Glucose-Capillary: 123 mg/dL — ABNORMAL HIGH (ref 70–99)
Glucose-Capillary: 125 mg/dL — ABNORMAL HIGH (ref 70–99)
Glucose-Capillary: 126 mg/dL — ABNORMAL HIGH (ref 70–99)
Glucose-Capillary: 128 mg/dL — ABNORMAL HIGH (ref 70–99)

## 2021-11-08 MED ORDER — LACTATED RINGERS IV SOLN: INTRAVENOUS | Status: AC

## 2021-11-08 MED ORDER — LACTATED RINGERS IV BOLUS
500.0000 mL | Freq: Once | INTRAVENOUS | Status: AC
  Administered 2021-11-08: 500 mL via INTRAVENOUS

## 2021-11-08 NOTE — Progress Notes (Signed)
Subjective: The patient is intubated and comatose.  She is in no apparent distress.  Her husband is at the bedside.    Objective: Vital signs in last 24 hours: Temp:  [97.9 F (36.6 C)-99.9 F (37.7 C)] 97.9 F (36.6 C) (10/14 0700) Pulse Rate:  [81-114] 82 (10/14 0700) Resp:  [16-22] 18 (10/14 0700) BP: (64-213)/(38-115) 90/65 (10/14 0700) SpO2:  [92 %-100 %] 98 % (10/14 0700) FiO2 (%):  [40 %] 40 % (10/14 0323) Weight:  [68.7 kg] 68.7 kg (10/14 0500) Estimated body mass index is 27.7 kg/m as calculated from the following:   Height as of this encounter: '5\' 2"'$  (1.575 m).   Weight as of this encounter: 68.7 kg.   Intake/Output from previous day: 10/13 0701 - 10/14 0700 In: 2366.6 [I.V.:1166.6; NG/GT:1200] Out: 3350 [Urine:3350] Intake/Output this shift: Total I/O In: -  Out: 400 [Urine:400]  Physical exam scale coma scale 3.  Her pupils are fixed and dilated bilaterally.  Lab Results: No results for input(s): "WBC", "HGB", "HCT", "PLT" in the last 72 hours. BMET No results for input(s): "NA", "K", "CL", "CO2", "GLUCOSE", "BUN", "CREATININE", "CALCIUM" in the last 72 hours.  Studies/Results: No results found.  Assessment/Plan: Hospital day #37: I discussed the situation with the patient's husband.  He understands that she is not going to recover from this.  LOS: 37 days     [name] 11/08/2021, 8:49 AM

## 2021-11-08 NOTE — Progress Notes (Signed)
NAME:  [name], MRN:  [id number], DOB:  [birth year], LOS: 74 ADMISSION DATE:  10/04/2021, CONSULTATION DATE: 10/04/2021 REFERRING MD: ED physician, CHIEF COMPLAINT: Subarachnoid hemorrhage  History of Present Illness:  46 yo female former smoker was found unresponsive by her husband and EMS called.  She was making gurgling noises with her breathing.  She was found to have SVT and EMS performed DCCV.  She had GCS 4 in ER and intubated for airway protection.  Found to have large SAH and neurosurgery arranged for hospital admission.  PCCM consulted to assist with management in ICU.  Pertinent  Medical History  Hypertension, uterine fibroids Legally blind in left eye  Significant Hospital Events: Including procedures, antibiotic start and stop dates in addition to other pertinent events   9/07 presented unresponsive, hypotensive; CT head with subarachnoid hemorrhage 9/11 DDAVP give for Central DI, repeat head CT with worsening infarcts and herniation 9/12 Attempted transfer for second opinion at [facility] and [facility].  No beds at [facility] and transfer was declined at [facility] 9/13 no change in mental status.  Transfer to [facility] declined 9/14 [facility] declined transfer again.  No change.  DDAVP x 1 9/18 palliative care consulted 9/23 Na increased >> restart DDAVP and increase free water 9/25 plan for trach. Increased pressor requirement. Added midodrine  9/26 dc d5W. Weaning NE as able. On I/O cath protocol  9/27 worse renal fxn and new hyperkalemia  9/30 renal function improving 10/5: off levo 10/31/2021 plan for PEG tube 10/7 Bps elevated in AM, monitoring  Interim History / Subjective:  Levophed resumed this morning. Having copious UOP for DI. Anemic but family not wanting to do blood transfusion.    Objective   Blood pressure 90/65, pulse 82, temperature 97.9 F (36.6 C), resp. rate 18, height '5\' 2"'$  (1.575 m), weight 68.7 kg, SpO2 98 %.    Vent Mode: PRVC FiO2 (%):  [40 %] 40 % Set  Rate:  [18 bmp] 18 bmp Vt Set:  [400 mL] 400 mL PEEP:  [5 cmH20] 5 cmH20 Plateau Pressure:  [19 cmH20-21 cmH20] 19 cmH20   Intake/Output Summary (Last 24 hours) at 11/08/2021 1027 Last data filed at 11/08/2021 0800 Gross per 24 hour  Intake 2099.39 ml  Output 3000 ml  Net -900.61 ml   Filed Weights   11/06/21 0500 11/07/21 0515 11/08/21 0500  Weight: 70.5 kg 70.5 kg 68.7 kg    Examination: Gen:      Intubated, sedated, acutely ill appearing HEENT:  trach to vent Lungs:    sounds of mechanical ventilation auscultated no wheezes or crackles CV:         RRR no mrg Abd:      + bowel sounds; soft, non-tender; no palpable masses, no distension Ext:    No edema Skin:      Warm and dry; no rashes Neuro:  not responsive, pupils fixed and dilated   Resolved Hospital Problem list    Aspiration pneumonitis >> completed ABx 9/13, AKI from hypovolemia Acute kidney injury due to hypovolemia Steroid-induced hyperglycemia  Assessment & Plan:  Subarachnoid hemorrhage with neurologic devastation, comatose, cerebral edema with brain compression and herniation syndrome No neurological recovery No change in neurological exam with absent brainstem reflexes Continue supportive care Goals of care discussion ongoing, currently full code.    Hypernatremia due to central 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 Continue free water flushes We are minimizing labs check considering patient family does not  accept blood transfusion and she is anemic Continue DDAVP  Hypotension due to hypovolemia caused by central DI Patient remained hypotensive despite being on DDAVP She is making large amount of urine due to extensive brain damage Continue Levophed with map goal 65 Currently at 11 mics Will bolus LR and start some maintenance fluids, if she is fluid responsive probably needs some additional free H20  Consider midodrine   Anemia of chronic disease and iron  deficiency anemia due to phlebotomy No signs of bleeding Patient family is still deciding if they would like her to be transfused as her hemoglobin is less than 7 Minimize blood draws, CBC only if concern for infection of bleeding, BMP daily foir now consider decreasing frequency  Best Practice (right click and "Reselect all SmartList Selections" daily)   Diet/type: tubefeeds and NPO w/ meds via tube  DVT prophylaxis: prophylactic heparin  GI prophylaxis: PPI Lines: Central line- RUE PICC Foley:  N/A Code Status:  full code 11/08/2021 updated husband at bedside   The patient is critically ill due to respiratory failure, encephalopathy, shock.  Critical care was necessary to treat or prevent imminent or life-threatening deterioration.  Critical care was time spent personally by me on the following activities: development of treatment plan with patient and/or surrogate as well as nursing, discussions with consultants, evaluation of patient's response to treatment, examination of patient, obtaining history from patient or surrogate, ordering and performing treatments and interventions, ordering and review of laboratory studies, ordering and review of radiographic studies, pulse oximetry, re-evaluation of patient's condition and participation in multidisciplinary rounds.   Critical Care Time devoted to patient care services described in this note is 40 minutes. This time reflects time of care of this [place] . This critical care time does not reflect separately billable procedures or procedure time, teaching time or supervisory time of PA/NP/Med student/Med Resident etc but could involve care discussion time.       Spero Geralds [place] [facility] 11/08/2021 10:27 AM  Pager: see AMION  If no response to pager , please call critical care on call (see AMION) until 7pm After 7:00 pm call Elink

## 2021-11-09 DIAGNOSIS — G931 Anoxic brain damage, not elsewhere classified: Secondary | ICD-10-CM

## 2021-11-09 DIAGNOSIS — E232 Diabetes insipidus: Secondary | ICD-10-CM | POA: Diagnosis not present

## 2021-11-09 DIAGNOSIS — I609 Nontraumatic subarachnoid hemorrhage, unspecified: Secondary | ICD-10-CM | POA: Diagnosis not present

## 2021-11-09 DIAGNOSIS — R579 Shock, unspecified: Secondary | ICD-10-CM

## 2021-11-09 LAB — BASIC METABOLIC PANEL
Anion gap: 6 (ref 5–15)
BUN: 35 mg/dL — ABNORMAL HIGH (ref 6–20)
CO2: 32 mmol/L (ref 22–32)
Calcium: 9.6 mg/dL (ref 8.9–10.3)
Chloride: 122 mmol/L — ABNORMAL HIGH (ref 98–111)
Creatinine, Ser: 1.56 mg/dL — ABNORMAL HIGH (ref 0.44–1.00)
GFR, Estimated: 41 mL/min — ABNORMAL LOW (ref >60)
Glucose, Bld: 230 mg/dL — ABNORMAL HIGH (ref 70–99)
Potassium: 3.5 mmol/L (ref 3.5–5.1)
Sodium: 160 mmol/L — ABNORMAL HIGH (ref 135–145)

## 2021-11-09 LAB — CBC
HCT: 28.2 % — ABNORMAL LOW (ref 36.0–46.0)
Hemoglobin: 7.8 g/dL — ABNORMAL LOW (ref 12.0–15.0)
MCH: 30.5 pg (ref 26.0–34.0)
MCHC: 27.7 g/dL — ABNORMAL LOW (ref 30.0–36.0)
MCV: 110.2 fL — ABNORMAL HIGH (ref 80.0–100.0)
Platelets: 274 10*3/uL (ref 150–400)
RBC: 2.56 MIL/uL — ABNORMAL LOW (ref 3.87–5.11)
RDW: 19.5 % — ABNORMAL HIGH (ref 11.5–15.5)
WBC: 7.9 10*3/uL (ref 4.0–10.5)
nRBC: 0.6 % — ABNORMAL HIGH (ref 0.0–0.2)

## 2021-11-09 LAB — GLUCOSE, CAPILLARY
Glucose-Capillary: 118 mg/dL — ABNORMAL HIGH (ref 70–99)
Glucose-Capillary: 131 mg/dL — ABNORMAL HIGH (ref 70–99)
Glucose-Capillary: 134 mg/dL — ABNORMAL HIGH (ref 70–99)
Glucose-Capillary: 111 mg/dL — ABNORMAL HIGH (ref 70–99)
Glucose-Capillary: 132 mg/dL — ABNORMAL HIGH (ref 70–99)
Glucose-Capillary: 129 mg/dL — ABNORMAL HIGH (ref 70–99)

## 2021-11-09 MED ORDER — FREE WATER
200.0000 mL | Status: DC
  Administered 2021-11-09 – 2021-11-10 (×9): 200 mL

## 2021-11-09 NOTE — Progress Notes (Signed)
NEUROSURGERY PROGRESS NOTE  No acute events overnight, continue supportive care  Temp:  [97 F (36.1 C)-99 F (37.2 C)] 99 F (37.2 C) (10/15 0715) Pulse Rate:  [82-99] 90 (10/15 0810) Resp:  [17-23] 18 (10/15 0810) BP: (68-121)/(46-85) 100/70 (10/15 0715) SpO2:  [94 %-98 %] 97 % (10/15 0810) FiO2 (%):  [30 %] 30 % (10/15 0810) Weight:  [66.8 kg] 66.8 kg (10/15 0100)    Eleonore Chiquito, NP 11/09/2021 9:09 AM

## 2021-11-09 NOTE — Progress Notes (Signed)
NAME:  [name], MRN:  [id number], DOB:  [birth year], LOS: 35 ADMISSION DATE:  10/03/2021, CONSULTATION DATE: 10/20/2021 REFERRING MD: ED physician, CHIEF COMPLAINT: Subarachnoid hemorrhage  History of Present Illness:  46 yo female former smoker was found unresponsive by her husband and EMS called.  She was making gurgling noises with her breathing.  She was found to have SVT and EMS performed DCCV.  She had GCS 4 in ER and intubated for airway protection.  Found to have large SAH and neurosurgery arranged for hospital admission.  PCCM consulted to assist with management in ICU.  Pertinent  Medical History  Hypertension, uterine fibroids Legally blind in left eye  Significant Hospital Events: Including procedures, antibiotic start and stop dates in addition to other pertinent events   9/07 presented unresponsive, hypotensive; CT head with subarachnoid hemorrhage 9/11 DDAVP give for Central DI, repeat head CT with worsening infarcts and herniation 9/12 Attempted transfer for second opinion at [facility] and [facility].  No beds at [facility] and transfer was declined at [facility] 9/13 no change in mental status.  Transfer to [facility] declined 9/14 [facility] declined transfer again.  No change.  DDAVP x 1 9/18 palliative care consulted 9/23 Na increased >> restart DDAVP and increase free water 9/25 plan for trach. Increased pressor requirement. Added midodrine  9/26 dc d5W. Weaning NE as able. On I/O cath protocol  9/27 worse renal fxn and new hyperkalemia  9/30 renal function improving 10/5: off levo 10/31/2021 plan for PEG tube 10/7 Bps elevated in AM, monitoring  Interim History / Subjective:  No overnight issues. Did not respond to trial of bolus or IVF to help get off pressors yesterday. Still on 10 mcg levophed.   Objective   Blood pressure 100/70, pulse 90, temperature 99 F (37.2 C), resp. rate 18, height '5\' 2"'$  (1.575 m), weight 66.8 kg, SpO2 97 %.    Vent Mode: PRVC FiO2 (%):  [30 %] 30  % Set Rate:  [18 bmp] 18 bmp Vt Set:  [400 mL] 400 mL PEEP:  [5 cmH20] 5 cmH20 Plateau Pressure:  [18 [id number] cmH20] 18 cmH20   Intake/Output Summary (Last 24 hours) at 11/09/2021 0851 Last data filed at 11/09/2021 0700 Gross per 24 hour  Intake 3805.51 ml  Output 2330 ml  Net 1475.51 ml   Filed Weights   11/07/21 0515 11/08/21 0500 11/09/21 0100  Weight: 70.5 kg 68.7 kg 66.8 kg    Examination: Gen:      Intubated, acutely ill appearing HEENT:  trach to vent Lungs:    sounds of mechanical ventilation auscultated, no wheezes CV:         RRR no mrg Abd:      + bowel sounds; soft, non-tender; no palpable masses, no distension Ext:    No edema Skin:      Warm and dry; no rashes Neuro:   not responsive, pupils non reactive    Resolved Hospital Problem list    Aspiration pneumonitis >> completed ABx 9/13, AKI from hypovolemia Acute kidney injury due to hypovolemia Steroid-induced hyperglycemia  Assessment & Plan:  Subarachnoid hemorrhage with neurologic devastation, comatose, cerebral edema with brain compression and herniation syndrome No neurological recovery No change in neurological exam with absent brainstem reflexes Continue supportive care Goals of care discussion ongoing, currently full code.    Hypernatremia due to central 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 Continue free water flushes We are minimizing labs check considering patient family  does not accept blood transfusion and she is anemic Continue DDAVP  Hypotension due to hypovolemia caused by central DI Patient remained hypotensive despite being on DDAVP She is making large amount of urine due to extensive brain damage Continue Levophed with map goal 65 Currently at 10 mics Did not respond to fluids. Consider trial of midodrine - will check BMET today before starting.   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 No signs of  bleeding Patient family is still deciding if they would like her to be transfused as her hemoglobin is less than 7 Minimize blood draws, CBC only if concern for infection of bleeding, BMP daily foir now consider decreasing frequency - will check one today given persistent hypotension  Best Practice (right click and "Reselect all SmartList Selections" daily)   Diet/type: tubefeeds and NPO w/ meds via tube  DVT prophylaxis: prophylactic heparin  GI prophylaxis: PPI Lines: Central line- RUE PICC Foley:  N/A Code Status:  full code 11/08/2021 updated husband at bedside   The patient is critically ill due to shock, encephalopathy, respiratory failure.  Critical care was necessary to treat or prevent imminent or life-threatening deterioration.  Critical care was time spent personally by me on the following activities: development of treatment plan with patient and/or surrogate as well as nursing, discussions with consultants, evaluation of patient's response to treatment, examination of patient, obtaining history from patient or surrogate, ordering and performing treatments and interventions, ordering and review of laboratory studies, ordering and review of radiographic studies, pulse oximetry, re-evaluation of patient's condition and participation in multidisciplinary rounds.   Critical Care Time devoted to patient care services described in this note is 33 minutes. This time reflects time of care of this [place] . This critical care time does not reflect separately billable procedures or procedure time, teaching time or supervisory time of PA/NP/Med student/Med Resident etc but could involve care discussion time.       Spero Geralds [place] [facility] 11/09/2021 8:51 AM  Pager: see AMION  If no response to pager , please call critical care on call (see AMION) until 7pm After 7:00 pm call Elink

## 2021-11-10 DIAGNOSIS — I609 Nontraumatic subarachnoid hemorrhage, unspecified: Secondary | ICD-10-CM | POA: Diagnosis not present

## 2021-11-10 LAB — GLUCOSE, CAPILLARY
Glucose-Capillary: 108 mg/dL — ABNORMAL HIGH (ref 70–99)
Glucose-Capillary: 109 mg/dL — ABNORMAL HIGH (ref 70–99)
Glucose-Capillary: 116 mg/dL — ABNORMAL HIGH (ref 70–99)
Glucose-Capillary: 120 mg/dL — ABNORMAL HIGH (ref 70–99)
Glucose-Capillary: 121 mg/dL — ABNORMAL HIGH (ref 70–99)
Glucose-Capillary: 127 mg/dL — ABNORMAL HIGH (ref 70–99)

## 2021-11-10 MED ORDER — VANCOMYCIN HCL IN DEXTROSE 1-5 GM/200ML-% IV SOLN: 1000.0000 mg | INTRAVENOUS | Status: AC

## 2021-11-10 NOTE — Progress Notes (Signed)
NAME:  [name], MRN:  [id number], DOB:  December 01, 1975, LOS: 23 ADMISSION DATE:  10/22/2021, CONSULTATION DATE: 10/09/2021 REFERRING MD: ED physician, CHIEF COMPLAINT: Subarachnoid hemorrhage  History of Present Illness:  46 yo female former smoker was found unresponsive by her husband and EMS called.  She was making gurgling noises with her breathing.  She was found to have SVT and EMS performed DCCV.  She had GCS 4 in ER and intubated for airway protection.  Found to have large SAH and neurosurgery arranged for hospital admission.  PCCM consulted to assist with management in ICU.  Pertinent  Medical History  Hypertension, uterine fibroids Legally blind in left eye  Significant Hospital Events: Including procedures, antibiotic start and stop dates in addition to other pertinent events   9/07 presented unresponsive, hypotensive; CT head with subarachnoid hemorrhage 9/11 DDAVP give for Central DI, repeat head CT with worsening infarcts and herniation 9/12 Attempted transfer for second opinion at [facility].  No beds at [facility] and transfer was declined at [facility] 9/13 no change in mental status.  Transfer to [facility] declined 9/14 [facility] declined transfer again.  No change.  DDAVP x 1 9/18 palliative care consulted 9/23 Na increased >> restart DDAVP and increase free water 9/25 plan for trach. Increased pressor requirement. Added midodrine  9/26 dc d5W. Weaning NE as able. On I/O cath protocol  9/27 worse renal fxn and new hyperkalemia  9/30 renal function improving 10/5: off levo 10/31/2021 plan for PEG tube 10/7 Bps elevated in AM, monitoring  Interim History / Subjective:  No overnight issues. Family asking about peg tube.   Objective   Blood pressure 97/71, pulse 82, temperature 98.8 F (37.1 C), resp. rate 18, height '5\' 2"'$  (1.575 m), weight 68.9 kg, SpO2 97 %.    Vent Mode: PRVC FiO2 (%):  [30 %] 30 % Set Rate:  [18 bmp] 18 bmp Vt Set:  [400 mL] 400 mL PEEP:  [5 cmH20]  5 cmH20 Plateau Pressure:  [18 cmH20-20 cmH20] 19 cmH20   Intake/Output Summary (Last 24 hours) at 11/10/2021 0915 Last data filed at 11/10/2021 0900 Gross per 24 hour  Intake 1545.36 ml  Output 2255 ml  Net -709.64 ml   Filed Weights   11/08/21 0500 11/09/21 0100 11/10/21 0500  Weight: 68.7 kg 66.8 kg 68.9 kg    Examination: Gen:      Intubated, sedated, acutely ill appearing HEENT:  ETT to vent Lungs:    sounds of mechanical ventilation auscultated no wheezes CV:         RRR no mrg Abd:      + bowel sounds; soft, non-tender; no palpable masses, no distension Ext:    No edema Skin:      Warm and dry; no rashes Neuro:  not responsive, no purposeful movements.  Labs rechecked 10/15 Hypernatremia Hgb improved over 7 without transfusion   Resolved Hospital Problem list    Aspiration pneumonitis >> completed ABx 9/13, AKI from hypovolemia Acute kidney injury due to hypovolemia Steroid-induced hyperglycemia  Assessment & Plan:  Subarachnoid hemorrhage with neurologic devastation, comatose, cerebral edema with brain compression and herniation syndrome No neurological recovery No change in neurological exam with absent brainstem reflexes Continue supportive care Goals of care discussion ongoing, currently full code.    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 No signs of bleeding Patient family is still deciding if they  would like her to be transfused as her hemoglobin is less than 7 Minimize blood draws, CBC only if concern for infection of bleeding, BMP daily foir now consider decreasing frequency   Best Practice (right click and "Reselect all SmartList Selections" daily)   Diet/type: tubefeeds and NPO w/ meds via tube Will talk to IR about PEG tube today. They said they could probably get to it this week DVT prophylaxis: prophylactic heparin  GI prophylaxis: PPI Lines: Central line- RUE PICC Foley:  N/A Code Status:  full code 11/08/2021 updated husband at bedside  The patient is critically ill due to respiratory failure, encephalopathy.  Critical care was necessary to treat or prevent imminent or life-threatening deterioration.  Critical care was time spent personally by me on the following activities: development of treatment plan with patient and/or surrogate as well as nursing, discussions with consultants, evaluation of patient's response to treatment, examination of patient, obtaining history from patient or surrogate, ordering and performing treatments and interventions, ordering and review of laboratory studies, ordering and review of radiographic studies, pulse oximetry, re-evaluation of patient's condition and participation in multidisciplinary rounds.   Critical Care Time devoted to patient care services described in this note is 33 minutes. This time reflects time of care of this [place] . This critical care time does not reflect separately billable procedures or procedure time, teaching time or supervisory time of PA/NP/Med student/Med Resident etc but could involve care discussion time.       Spero Geralds  Pulmonary and Critical Care Medicine 11/10/2021 9:15 AM  Pager: see AMION  If no response to pager , please call critical care on call (see AMION) until 7pm After 7:00 pm call Elink

## 2021-11-10 NOTE — TOC Progression Note (Signed)
Transition of Care [facility]) - Progression Note    Patient Details  Name: [name] MRN: [id number] Date of Birth: 04/02/1975  [facility]) CM/SW Contact  Ella Bodo, RN Phone Number: 11/10/2021, 4:34 PM  Clinical Narrative:    Noted patient may have PEG placement in IR this week.  With regards to disposition, patient will not be able to be placed as long as needed for vasopressors continues.  Will continue to follow patient progress.    Barriers to Discharge: Continued Medical Work up  Expected Discharge Plan and Services     Discharge Planning Services: CM Consult   Living arrangements for the past 2 months: Single Family Home                                       Social Determinants of Health (SDOH) Interventions    Readmission Risk Interventions     No data to display         Reinaldo Raddle, RN, BSN  Trauma/Neuro ICU Case Manager [phone]

## 2021-11-10 NOTE — Progress Notes (Signed)
Patient ID: [name], female   DOB: 04-01-1975, 46 y.o.   MRN: [id number] BP 97/70   Pulse 87   Temp 99.5 F (37.5 C)   Resp 18   Ht '5\' 2"'$  (1.575 m)   Wt 68.9 kg   SpO2 97%   BMI 27.78 kg/m  Comatose, pupils fixed dilated No corneals No change in exam.  No improvement Feeding tube tomorrow

## 2021-11-11 DIAGNOSIS — N179 Acute kidney failure, unspecified: Secondary | ICD-10-CM | POA: Diagnosis not present

## 2021-11-11 DIAGNOSIS — J9611 Chronic respiratory failure with hypoxia: Secondary | ICD-10-CM | POA: Diagnosis not present

## 2021-11-11 DIAGNOSIS — I609 Nontraumatic subarachnoid hemorrhage, unspecified: Secondary | ICD-10-CM | POA: Diagnosis not present

## 2021-11-11 LAB — CBC WITH DIFFERENTIAL/PLATELET
Abs Immature Granulocytes: 0.03 10*3/uL (ref 0.00–0.07)
Basophils Absolute: 0 10*3/uL (ref 0.0–0.1)
Basophils Relative: 0 %
Eosinophils Absolute: 0.4 10*3/uL (ref 0.0–0.5)
Eosinophils Relative: 4 %
HCT: 30.2 % — ABNORMAL LOW (ref 36.0–46.0)
Hemoglobin: 8.6 g/dL — ABNORMAL LOW (ref 12.0–15.0)
Immature Granulocytes: 0 %
Lymphocytes Relative: 48 %
Lymphs Abs: 4.2 10*3/uL — ABNORMAL HIGH (ref 0.7–4.0)
MCH: 30.4 pg (ref 26.0–34.0)
MCHC: 28.5 g/dL — ABNORMAL LOW (ref 30.0–36.0)
MCV: 106.7 fL — ABNORMAL HIGH (ref 80.0–100.0)
Monocytes Absolute: 0.5 10*3/uL (ref 0.1–1.0)
Monocytes Relative: 5 %
Neutro Abs: 3.8 10*3/uL (ref 1.7–7.7)
Neutrophils Relative %: 43 %
Platelets: 272 10*3/uL (ref 150–400)
RBC: 2.83 MIL/uL — ABNORMAL LOW (ref 3.87–5.11)
RDW: 18.7 % — ABNORMAL HIGH (ref 11.5–15.5)
WBC: 8.8 10*3/uL (ref 4.0–10.5)
nRBC: 0.8 % — ABNORMAL HIGH (ref 0.0–0.2)

## 2021-11-11 LAB — BASIC METABOLIC PANEL
Anion gap: 11 (ref 5–15)
BUN: 48 mg/dL — ABNORMAL HIGH (ref 6–20)
CO2: 33 mmol/L — ABNORMAL HIGH (ref 22–32)
Calcium: 11.7 mg/dL — ABNORMAL HIGH (ref 8.9–10.3)
Chloride: 129 mmol/L — ABNORMAL HIGH (ref 98–111)
Creatinine, Ser: 2.06 mg/dL — ABNORMAL HIGH (ref 0.44–1.00)
GFR, Estimated: 29 mL/min — ABNORMAL LOW (ref >60)
Glucose, Bld: 119 mg/dL — ABNORMAL HIGH (ref 70–99)
Potassium: 3.8 mmol/L (ref 3.5–5.1)
Sodium: 173 mmol/L (ref 135–145)

## 2021-11-11 LAB — APTT: aPTT: 42 seconds — ABNORMAL HIGH (ref 24–36)

## 2021-11-11 LAB — SURGICAL PCR SCREEN
MRSA, PCR: NEGATIVE
Staphylococcus aureus: NEGATIVE

## 2021-11-11 LAB — GLUCOSE, CAPILLARY
Glucose-Capillary: 95 mg/dL (ref 70–99)
Glucose-Capillary: 103 mg/dL — ABNORMAL HIGH (ref 70–99)
Glucose-Capillary: 123 mg/dL — ABNORMAL HIGH (ref 70–99)
Glucose-Capillary: 169 mg/dL — ABNORMAL HIGH (ref 70–99)
Glucose-Capillary: 153 mg/dL — ABNORMAL HIGH (ref 70–99)
Glucose-Capillary: 160 mg/dL — ABNORMAL HIGH (ref 70–99)

## 2021-11-11 LAB — PROTIME-INR
INR: 1.2 (ref 0.8–1.2)
Prothrombin Time: 15.2 seconds (ref 11.4–15.2)

## 2021-11-11 LAB — OSMOLALITY, URINE: Osmolality, Ur: 171 mOsm/kg — ABNORMAL LOW (ref 300–900)

## 2021-11-11 MED ORDER — DEXTROSE 5 % IV SOLN: INTRAVENOUS | Status: DC

## 2021-11-11 MED ORDER — DESMOPRESSIN ACETATE 4 MCG/ML IJ SOLN
3.5000 ug | Freq: Once | INTRAMUSCULAR | Status: AC
  Administered 2021-11-11: 3.5 ug via INTRAVENOUS
  Filled 2021-11-11: qty 1

## 2021-11-11 MED ORDER — FREE WATER: 200.0000 mL | Status: DC

## 2021-11-11 MED ORDER — DESMOPRESSIN ACETATE 4 MCG/ML IJ SOLN
4.0000 ug | Freq: Every day | INTRAMUSCULAR | Status: DC
  Administered 2021-11-12: 4 ug via INTRAVENOUS
  Filled 2021-11-11 (×2): qty 1

## 2021-11-11 MED ORDER — MUPIROCIN 2 % EX OINT
1.0000 | TOPICAL_OINTMENT | Freq: Two times a day (BID) | CUTANEOUS | Status: AC
  Administered 2021-11-11 – 2021-11-15 (×5): 1 via NASAL
  Filled 2021-11-11: qty 22

## 2021-11-11 MED ORDER — FREE WATER
400.0000 mL | Status: DC
  Administered 2021-11-11 – 2021-11-13 (×17): 400 mL

## 2021-11-11 NOTE — Progress Notes (Signed)
NAME:  ZAILA CREW, MRN:  [id number], DOB:  1975-03-19, LOS: 7 ADMISSION DATE:  10/21/2021, CONSULTATION DATE: 10/01/2021 REFERRING MD: ED physician, CHIEF COMPLAINT: Subarachnoid hemorrhage  History of Present Illness:  46 yo female former smoker was found unresponsive by her husband and EMS called.  She was making gurgling noises with her breathing.  She was found to have SVT and EMS performed DCCV.  She had GCS 4 in ER and intubated for airway protection.  Found to have large SAH and neurosurgery arranged for hospital admission.  PCCM consulted to assist with management in ICU.  Pertinent  Medical History  Hypertension, uterine fibroids Legally blind in left eye  Significant Hospital Events: Including procedures, antibiotic start and stop dates in addition to other pertinent events   9/07 presented unresponsive, hypotensive; CT head with subarachnoid hemorrhage 9/11 DDAVP give for Central DI, repeat head CT with worsening infarcts and herniation 9/12 Attempted transfer for second opinion at [facility] and [facility].  No beds at [facility] and transfer was declined at [facility] 9/13 no change in mental status.  Transfer to [facility] declined 9/14 [facility] declined transfer again.  No change.  DDAVP x 1 9/18 palliative care consulted 9/23 Na increased >> restart DDAVP and increase free water 9/25 plan for trach. Increased pressor requirement. Added midodrine  9/26 dc d5W. Weaning NE as able. On I/O cath protocol  9/27 worse renal fxn and new hyperkalemia  9/30 renal function improving 10/5: off levo 10/31/2021 plan for PEG tube 10/7 Bps elevated in AM, monitoring  Interim History / Subjective:   Na 173 < 160. Remains on ddAVP SCr 1.56 > 2.06 Norepi 11 Planning for PEG placement  Objective   Blood pressure (!) 133/92, pulse 98, temperature 98.7 F (37.1 C), temperature source Axillary, resp. rate 18, height '5\' 2"'$  (1.575 m), weight 63.6 kg, SpO2 95 %.    Vent Mode: PRVC FiO2 (%):  [30 %] 30  % Set Rate:  [18 bmp] 18 bmp Vt Set:  [400 mL] 400 mL PEEP:  [5 cmH20] 5 cmH20 Plateau Pressure:  [18 cmH20-21 cmH20] 19 cmH20   Intake/Output Summary (Last 24 hours) at 11/11/2021 0907 Last data filed at 11/11/2021 0700 Gross per 24 hour  Intake 1812.55 ml  Output 3915 ml  Net -2102.45 ml   Filed Weights   11/09/21 0100 11/10/21 0500 11/11/21 0500  Weight: 66.8 kg 68.9 kg 63.6 kg    Examination: Gen:    Ill appearing woman, ventilated HEENT:  trach CDI, protruding tongue Lungs:  clear B  CV:       regular, no M Abd:   Non-distended, + BS Ext: No edema Skin:    no rash Neuro:  comatose. Some reflexive movement. Initiates a breath but only w stimulation. No change.     Resolved Hospital Problem list    Aspiration pneumonitis >> completed ABx 9/13, AKI from hypovolemia Acute kidney injury due to hypovolemia Steroid-induced hyperglycemia  Assessment & Plan:  Subarachnoid hemorrhage with neurologic devastation, comatose, cerebral edema with brain compression and herniation syndrome -Dismal neurological prognosis without any evidence of recovery with extended supportive care -Appreciate Dr. Lacy Duverney management and discussions with family.  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 -Continue scheduled DDAVP and increase to 27mg on 10/17 -Increased free water to 400 cc every 3 hours -Increase D5W to 100 cc/h -Follow sodium daily, minimizing  blood draws as patient does not accept transfusions  Hypotension due to hypovolemia caused by central DI -Norepinephrine for MAP goal 65, wean as able -Blood pressure being significantly impacted by volume shifts from her DI  Acute renal failure,likely due to volume shifts from DI -volume resuscitation to replace losses -Follow intermittent BMP, follow urine output -Not a candidate for HD should it come to  that  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 -Follow for any evidence of blood loss, none noted -Minimize phlebotomy -Would not want transfusion  Nutrition / protein calorie malnutrition -receiving TF and considering PEG placement. IR consulted and considering timing, hope to optimize volume status, hemodynamics, anemia. On hold for now.   Best Practice (right click and "Reselect all SmartList Selections" daily)   Diet/type: tubefeeds and NPO w/ meds via tube  DVT prophylaxis: prophylactic heparin  GI prophylaxis: PPI Lines: Central line- RUE PICC Foley:  N/A Code Status:  full code 11/11/2021 updated husband at bedside  The patient is critically ill due to respiratory failure, encephalopathy.  Critical care was necessary to treat or prevent imminent or life-threatening deterioration.  Critical care was time spent personally by me on the following activities: development of treatment plan with patient and/or surrogate as well as nursing, discussions with consultants, evaluation of patient's response to treatment, examination of patient, obtaining history from patient or surrogate, ordering and performing treatments and interventions, ordering and review of laboratory studies, ordering and review of radiographic studies, pulse oximetry, re-evaluation of patient's condition and participation in multidisciplinary rounds.   Critical Care Time devoted to patient care services described in this note is 31 minutes. This time reflects time of care of this [place] . This critical care time does not reflect separately billable procedures or procedure time, teaching time or supervisory time of PA/NP/Med student/Med Resident etc but could involve care discussion time.      Baltazar Apo, MD, PhD 11/11/2021, 9:15 AM [place] [facility] [phone] or if no answer before 7:00PM call [phone] For any issues after 7:00PM please call eLink  [phone]

## 2021-11-11 NOTE — Progress Notes (Signed)
E-Link notified of sodium of 173.

## 2021-11-11 NOTE — Progress Notes (Signed)
eLink Physician-Brief Progress Note Patient Name: [name] DOB: [birth year] MRN: [id number]   Date of Service  11/11/2021  HPI/Events of Note  Patient scheduled for PEG today - Nursing request for AM labs.   eICU Interventions  Will order CBC with platelets, BMP, PT/INR and PTT in AM.     Intervention Category Major Interventions: Other:  Jaymin Waln Cornelia Copa 11/11/2021, 1:27 AM

## 2021-11-11 NOTE — Progress Notes (Signed)
eLink Physician-Brief Progress Note Patient Name: [name] DOB: December 06, 1975 MRN: [id number]   Date of Service  11/11/2021  HPI/Events of Note  Hypernatremia - Na+ = 173. Patient has history of Central DI.  eICU Interventions  Plan: Please give daily DDAVP 0.52 mcg IV dose now. Increase free water via tube to Q 2 hours.  D5W IV infusion at 50 mL/hour. Repeat BMP at 1 PM.      Intervention Category Major Interventions: Electrolyte abnormality - evaluation and management  Daiya Tamer [place] 11/11/2021, 6:56 AM

## 2021-11-11 NOTE — Progress Notes (Signed)
Patient ID: [name], female   DOB: 10-28-1975, 46 y.o.   MRN: [id number] BP 109/75   Pulse 80   Temp 98.3 F (36.8 C) (Axillary)   Resp 18   Ht '5\' 2"'$  (1.575 m)   Wt 63.6 kg   SpO2 96%   BMI 25.65 kg/m  Comatose, no neurological improvement Sodium now 173 Hgb 8.8, though this is falsely elevated due to dehydration Feeding tube deferred for now.

## 2021-11-12 DIAGNOSIS — I609 Nontraumatic subarachnoid hemorrhage, unspecified: Secondary | ICD-10-CM | POA: Diagnosis not present

## 2021-11-12 LAB — GLUCOSE, CAPILLARY
Glucose-Capillary: 142 mg/dL — ABNORMAL HIGH (ref 70–99)
Glucose-Capillary: 131 mg/dL — ABNORMAL HIGH (ref 70–99)
Glucose-Capillary: 151 mg/dL — ABNORMAL HIGH (ref 70–99)
Glucose-Capillary: 113 mg/dL — ABNORMAL HIGH (ref 70–99)
Glucose-Capillary: 142 mg/dL — ABNORMAL HIGH (ref 70–99)
Glucose-Capillary: 151 mg/dL — ABNORMAL HIGH (ref 70–99)

## 2021-11-12 LAB — BASIC METABOLIC PANEL
Anion gap: 15 (ref 5–15)
BUN: 38 mg/dL — ABNORMAL HIGH (ref 6–20)
CO2: 28 mmol/L (ref 22–32)
Calcium: 10.3 mg/dL (ref 8.9–10.3)
Chloride: 109 mmol/L (ref 98–111)
Creatinine, Ser: 1.57 mg/dL — ABNORMAL HIGH (ref 0.44–1.00)
GFR, Estimated: 41 mL/min — ABNORMAL LOW (ref >60)
Glucose, Bld: 132 mg/dL — ABNORMAL HIGH (ref 70–99)
Potassium: 2.8 mmol/L — ABNORMAL LOW (ref 3.5–5.1)
Sodium: 152 mmol/L — ABNORMAL HIGH (ref 135–145)

## 2021-11-12 MED ORDER — POTASSIUM CHLORIDE 10 MEQ/50ML IV SOLN
10.0000 meq | INTRAVENOUS | Status: AC
  Administered 2021-11-12 (×4): 10 meq via INTRAVENOUS
  Filled 2021-11-12 (×2): qty 50

## 2021-11-12 MED ORDER — MIDODRINE HCL 5 MG PO TABS: 10.0000 mg | ORAL_TABLET | Freq: Three times a day (TID) | ORAL | Status: DC

## 2021-11-12 MED ORDER — POTASSIUM CHLORIDE 20 MEQ PO PACK
40.0000 meq | PACK | Freq: Once | ORAL | Status: AC
  Administered 2021-11-12: 40 meq
  Filled 2021-11-12: qty 2

## 2021-11-12 MED ORDER — MIDODRINE HCL 5 MG PO TABS
10.0000 mg | ORAL_TABLET | Freq: Three times a day (TID) | ORAL | Status: DC
  Administered 2021-11-12 – 2021-11-13 (×4): 10 mg
  Filled 2021-11-12 (×3): qty 2

## 2021-11-12 NOTE — Progress Notes (Signed)
NAME:  [name], MRN:  [id number], DOB:  02/11/1975, LOS: 60 ADMISSION DATE:  10/14/2021, CONSULTATION DATE: 10/10/2021 REFERRING MD: EDP, CHIEF COMPLAINT: Subarachnoid hemorrhage  History of Present Illness:  46 yo female former smoker was found unresponsive by her husband and EMS called.  She was making gurgling noises with her breathing.  She was found to have SVT and EMS performed DCCV.  She had GCS 4 in ER and intubated for airway protection.  Found to have large SAH and neurosurgery arranged for hospital admission.  PCCM consulted to assist with management in ICU.  Pertinent Medical History:  Hypertension, uterine fibroids Legally blind in left eye  Significant Hospital Events: Including procedures, antibiotic start and stop dates in addition to other pertinent events   9/07 presented unresponsive, hypotensive; CT head with subarachnoid hemorrhage 9/11 DDAVP give for Central DI, repeat head CT with worsening infarcts and herniation 9/12 Attempted transfer for second opinion at [facility] and [facility].  No beds at [facility] and transfer was declined at [facility] 9/13 no change in mental status.  Transfer to [facility] declined 9/14 [facility] declined transfer again.  No change.  DDAVP x 1 9/18 palliative care consulted 9/23 Na increased >> restart DDAVP and increase free water 9/25 plan for trach. Increased pressor requirement. Added midodrine  9/26 dc d5W. Weaning NE as able. On I/O cath protocol  9/27 worse renal fxn and new hyperkalemia  9/30 renal function improving 10/5: off levo 10/31/2021 plan for PEG tube 10/7 BPs elevated in AM, monitoring 10/17 PEG deferred in the setting of severe hypernatremia (173), DI. 10/18 No significant neurologic change. Na improved to 152, remains on DDAVP with FWF. Weaning NE as able, BP remains labile.  Interim History / Subjective:  No significant events overnight BP remains labile, hypertensive on 9-10mcg NE; hypotensive when weaning to 612mg PEG deferred  yesterday 10/17 in the setting of Na 173, DI Continues on DDAVP, FWF No significant neurologic changes on exam Remains on vent via trach, Cortrak, Foley, PICC in place Daughter updated at bedside  Objective:  Blood pressure 121/86, pulse 72, temperature (!) 97.4 F (36.3 C), temperature source Axillary, resp. rate 18, height '5\' 2"'$  (1.575 m), weight 63.6 kg, SpO2 99 %.    Vent Mode: PRVC FiO2 (%):  [30 %] 30 % Set Rate:  [18 bmp] 18 bmp Vt Set:  [400 mL] 400 mL PEEP:  [5 cmH20] 5 cmH20 Plateau Pressure:  [17 cmH20-19 cmH20] 18 cmH20   Intake/Output Summary (Last 24 hours) at 11/12/2021 0732 Last data filed at 11/12/2021 0700 Gross per 24 hour  Intake 6778.62 ml  Output 925 ml  Net 5853.62 ml    Filed Weights   11/09/21 0100 11/10/21 0500 11/11/21 0500  Weight: 66.8 kg 68.9 kg 63.6 kg   Physical Examination: General: Chronically ill-appearing middle-aged woman in NAD. HEENT: [place]/AT, anicteric sclera, L pupil 537mround, nonreactive; R pupil 4.12m24mound nonreactive, moist mucous membranes; severe tongue edema with protrusion. Cortrak in place with bridle. Neck: Tracheostomy in place, midline with gauze dressing, scant clear-white secretions noted. Bilateral neck soft tissue swelling, symmetric. Neuro: Comatose. Does not respond to verbal, tactile or noxious stimuli. Side to side head motion with stimulation, drawing up legs nonpurposefully with pain/trap squeeze. +Triple flexion in BLE. No response to pain in BUE. Not following commands. No cough/gag/corneals. CV: RRR, no m/g/r. PULM: Breathing even and unlabored on vent via trach (PEEP 5, FiO2 30%). Lung fields CTAB. GI: Soft, nontender, nondistended. Normoactive  bowel sounds. Extremities: No LE edema noted. Bilateral upper extremity edema noted, dependent. RUE PICC, brachial. Skin: Warm/dry, no rashes.  Resolved Hospital Problem List:   Aspiration pneumonitis >> completed ABx 9/13, AKI from hypovolemia Acute kidney injury due  to hypovolemia Steroid-induced hyperglycemia  Assessment & Plan:  Subarachnoid hemorrhage with neurologic devastation, comatose, cerebral edema with brain compression and herniation syndrome Dismal neurological prognosis without any evidence of recovery with extended supportive care. - NSGY primary,  appreciate Dr. 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 - Continue DDAVP (increased to 59mg 10/17) - Continue FWF 4024mQ3H - D5W at 10083mr - Trend Na - Minimize phlebotomy as able; patient does not accept blood transfusions  Hypotension due to hypovolemia caused by central DI - Continue norepinephrine for goal MAP > 65 - Pressures remain labile, wean as able; sensitive with volume shifts - Midodrine '10mg'$  TID added 10/18 - Vasopressors continue to be a barrier to long term placement  Acute renal failure, likely due to volume shifts from DI - Trend BMP - Replete electrolytes as indicated - Monitor I&Os - F/u urine studies - Avoid nephrotoxic agents as able - Not a dialysis candidate  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 - Monitor for evidence of bleeding (none at present) - Minimize phlebotomy - Does not accept blood transfusions  Nutrition / protein calorie malnutrition Receiving TF and considering PEG placement. IR consulted and considering timing, hope to optimize volume status, hemodynamics, anemia. - PEG placement deferred 10/17 - Optimize volume status/lytes as able - Will need definitive enteral access prior to placement  Best Practice (right click and "Reselect all SmartList Selections" daily)   Diet/type: tubefeeds and  NPO w/ meds via tube  DVT prophylaxis: prophylactic heparin  GI prophylaxis: PPI Lines: Central line- RUE PICC Foley:  N/A Code Status:  full code 11/11/2021 updated husband at bedside  Critical care time:   The patient is critically ill with multiple organ system failure and requires high complexity decision making for assessment and support, frequent evaluation and titration of therapies, advanced monitoring, review of radiographic studies and interpretation of complex data.   Critical Care Time devoted to patient care services, exclusive of separately billable procedures, described in this note is 32 minutes.  SteLestine MountA-C  Pulmonary & Critical Care 11/12/21 7:33 AM  Please see [email or web address] for pager details.  From 7A-7P if no response, please call [phone] After hours, please call ELink [phone]

## 2021-11-13 DIAGNOSIS — G935 Compression of brain: Secondary | ICD-10-CM | POA: Diagnosis not present

## 2021-11-13 DIAGNOSIS — I609 Nontraumatic subarachnoid hemorrhage, unspecified: Secondary | ICD-10-CM | POA: Diagnosis not present

## 2021-11-13 DIAGNOSIS — N179 Acute kidney failure, unspecified: Secondary | ICD-10-CM | POA: Diagnosis not present

## 2021-11-13 LAB — BASIC METABOLIC PANEL
Anion gap: 10 (ref 5–15)
BUN: 30 mg/dL — ABNORMAL HIGH (ref 6–20)
CO2: 26 mmol/L (ref 22–32)
Calcium: 9.6 mg/dL (ref 8.9–10.3)
Chloride: 98 mmol/L (ref 98–111)
Creatinine, Ser: 1.27 mg/dL — ABNORMAL HIGH (ref 0.44–1.00)
GFR, Estimated: 52 mL/min — ABNORMAL LOW (ref >60)
Glucose, Bld: 139 mg/dL — ABNORMAL HIGH (ref 70–99)
Potassium: 4 mmol/L (ref 3.5–5.1)
Sodium: 134 mmol/L — ABNORMAL LOW (ref 135–145)

## 2021-11-13 LAB — GLUCOSE, CAPILLARY
Glucose-Capillary: 123 mg/dL — ABNORMAL HIGH (ref 70–99)
Glucose-Capillary: 142 mg/dL — ABNORMAL HIGH (ref 70–99)
Glucose-Capillary: 89 mg/dL (ref 70–99)
Glucose-Capillary: 129 mg/dL — ABNORMAL HIGH (ref 70–99)
Glucose-Capillary: 100 mg/dL — ABNORMAL HIGH (ref 70–99)
Glucose-Capillary: 110 mg/dL — ABNORMAL HIGH (ref 70–99)

## 2021-11-13 MED ORDER — MIDODRINE HCL 5 MG PO TABS
15.0000 mg | ORAL_TABLET | Freq: Three times a day (TID) | ORAL | Status: DC
  Administered 2021-11-13 – 2021-11-18 (×13): 15 mg
  Filled 2021-11-13 (×14): qty 3

## 2021-11-13 MED ORDER — FREE WATER
300.0000 mL | Status: DC
  Administered 2021-11-13 (×5): 300 mL

## 2021-11-13 MED ORDER — HEPARIN SODIUM (PORCINE) 5000 UNIT/ML IJ SOLN
5000.0000 [IU] | Freq: Three times a day (TID) | INTRAMUSCULAR | Status: DC
  Administered 2021-11-14 – 2021-11-23 (×26): 5000 [IU] via SUBCUTANEOUS
  Filled 2021-11-13 (×26): qty 1

## 2021-11-13 MED ORDER — BANATROL TF EN LIQD
60.0000 mL | Freq: Two times a day (BID) | ENTERAL | Status: DC
  Administered 2021-11-14 – 2021-12-07 (×47): 60 mL
  Filled 2021-11-13 (×46): qty 60

## 2021-11-13 NOTE — Progress Notes (Signed)
Patient ID: [name], female   DOB: 07-22-1975, 46 y.o.   MRN: [id number] BP (!) 142/94   Pulse 79   Temp 98.1 F (36.7 C) (Axillary)   Resp 18   Ht '5\' 2"'$  (1.575 m)   Wt 63.6 kg   SpO2 100%   BMI 25.65 kg/m  There are no neurological changes No new recommendations Still waiting on feeding tube.

## 2021-11-13 NOTE — Progress Notes (Signed)
IR was requested for G tube placement on 10/3, it has been discussed with family members and consent was obtained.   The procedure has been on hold due to low hgb and family refusal of blood product, hypernatremia, etc.  She was scheduled for G tube placement on Tuesday, it was canceled due to sodium 173.  BMP today sodium 134 CBC on 10/17 hgb 8.6  INR 10/17 1.2   IR was re-consulted today for G tube placement, the procedure is tentatively scheduled for tomorrow pending IR schedule.   Plan - Stop TF at [place] - 2g ancef to be given in IR during the procedure  - sq heparin held   Please call IR for questions and concerns.   Armando Gang Kaiyu Mirabal PA-C 11/13/2021 10:51 AM

## 2021-11-13 NOTE — Progress Notes (Signed)
Nutrition Follow-up  DOCUMENTATION CODES:   Not applicable  INTERVENTION:   Continue TF via Cortrak tube: Osmolite 1.5 at 50 ml/h (1200 ml per day) Prosource TF20 60 ml daily   Provides 1880 kcal, 95 gm protein, 912 ml free water daily    400 ml free water every 3 hours  Total free water: 4112 ml   Banatrol BID start 10/20  NUTRITION DIAGNOSIS:   Inadequate oral intake related to inability to eat as evidenced by NPO status. Ongoing  GOAL:   Patient will meet greater than or equal to 90% of their needs Goal met via TF  MONITOR:   TF tolerance  REASON FOR ASSESSMENT:   Consult Enteral/tube feeding initiation and management  ASSESSMENT:   Pt with PMH of HTN and legally blind in L eye admitted after being found down by husband with CPR. Per CT pt with large SAH.  Pt discussed during ICU rounds and with RN and MD.  PEG placement pending in IR as Na level allows; possible today and TF on hold.    9/25 s/p trach  9/26 cortrak placed; tip in distal antrum of the stomach   Medications: midodrine, protonix, protonix, miralax, senokot-s  D5 @ 50 ml/hr  Levophed @ 2 mcg    Labs: sodium 134 Hgb: 8.6 (10/17) CBG's: 113-151  UOP: 1480 mL x24 hours I/O's: +13.9 L since admission  Diet Order:   Diet Order             Diet NPO time specified Except for: Sips with Meds  Diet effective midnight                   EDUCATION NEEDS:   No education needs have been identified at this time  Skin:  Skin Assessment:  (L thigh skin tear)  Last BM:  10/18 large type 7  Height:   Ht Readings from Last 1 Encounters:  10/07/21 5' 2" (1.575 m)    Weight:   Wt Readings from Last 1 Encounters:  11/11/21 63.6 kg   BMI:  Body mass index is 25.65 kg/m.  Estimated Nutritional Needs:   Kcal:  1700-1900  Protein:  85-100 grams  Fluid:  >1.7 L/day  Lockie Pares., RD, LDN, CNSC See AMiON for contact information

## 2021-11-13 NOTE — Progress Notes (Addendum)
NAME:  [name], MRN:  [id number], DOB:  [birth year], LOS: 34 ADMISSION DATE:  10/25/2021, CONSULTATION DATE: 10/25/2021 REFERRING MD: EDP, CHIEF COMPLAINT: Subarachnoid hemorrhage  History of Present Illness:  46 yo female former smoker was found unresponsive by her husband and EMS called.  She was making gurgling noises with her breathing.  She was found to have SVT and EMS performed DCCV.  She had GCS 4 in ER and intubated for airway protection.  Found to have large SAH and neurosurgery arranged for hospital admission.  PCCM consulted to assist with management in ICU.  Pertinent Medical History:  Hypertension, uterine fibroids Legally blind in left eye  Significant Hospital Events: Including procedures, antibiotic start and stop dates in addition to other pertinent events   9/07 presented unresponsive, hypotensive; CT head with subarachnoid hemorrhage 9/11 DDAVP give for Central DI, repeat head CT with worsening infarcts and herniation 9/12 Attempted transfer for second opinion at [facility] and [facility].  No beds at [facility] and transfer was declined at [facility] 9/13 no change in mental status.  Transfer to [facility] declined 9/14 [facility] declined transfer again.  No change.  DDAVP x 1 9/18 palliative care consulted 9/23 Na increased >> restart DDAVP and increase free water 9/25 plan for trach. Increased pressor requirement. Added midodrine  9/26 dc d5W. Weaning NE as able. On I/O cath protocol  9/27 worse renal fxn and new hyperkalemia  9/30 renal function improving 10/5: off levo 10/31/2021 plan for PEG tube 10/7 BPs elevated in AM, monitoring 10/17 PEG deferred in the setting of severe hypernatremia (173), DI. 10/18 No significant neurologic change. Na improved to 152, remains on DDAVP with FWF. Weaning NE as able, BP remains labile. Midodrine initiated. 10/19 No significant neurologic change. BP improved with addition of midodrine, weaning NE. Increased midodrine to '15mg'$  TID.  Interim  History / Subjective:  No significant events overnight Per daughter, patient "had a good night" BP has continued be labile, sensitive to medications Midodrine dose increased today Weaning NE, down to 22mg this AM No significant neurologic change Continues with trach on vent, Cortrak, RUE PICC Na pending, PEG pending Na stabilization  Objective:  Blood pressure 124/81, pulse 81, temperature 98.9 F (37.2 C), temperature source Axillary, resp. rate 18, height '5\' 2"'$  (1.575 m), weight 63.6 kg, SpO2 100 %.    Vent Mode: PRVC FiO2 (%):  [30 %] 30 % Set Rate:  [18 bmp] 18 bmp Vt Set:  [400 mL] 400 mL PEEP:  [5 cmH20] 5 cmH20 Plateau Pressure:  [18 cmH20] 18 cmH20   Intake/Output Summary (Last 24 hours) at 11/13/2021 0737 Last data filed at 11/13/2021 0700 Gross per 24 hour  Intake 7155.43 ml  Output 1880 ml  Net 5275.43 ml    Filed Weights   11/09/21 0100 11/10/21 0500 11/11/21 0500  Weight: 66.8 kg 68.9 kg 63.6 kg   Physical Examination: General: Acutely ill-appearing middle-aged woman in NAD.  HEENT: [place]/AT, anicteric sclera, L pupil 543mround/nonreactive, R pupil 74m68mound/nonreactive, moist mucous membranes; severe tongue edema with protrusion, inferior aspect of tongue macerated due to copious secretions. Cortrak in place with bridle. Neck: Tracheostomy in place, midline with gauze/Mepilex dressing, scant clear secretions. Bilateral soft tissue neck swelling, symmetric. Neuro: Comatose. Does not respond to verbal, tactile or noxious stimuli. Not following commands. Moves BLE spontaneously with stimulation, draws legs up occasionally. +Triple flexion in BLE. No movement of BUE. Side to side rolling motion with stimulation. No cough/gag/corneals. CV: RRR, no  m/g/r. PULM: Breathing even and unlabored on vent via trach (PEEP 5, FiO2 30%). Lung fields CTAB. GI: Soft, nontender, nondistended. Normoactive bowel sounds. Extremities: No LE edema noted. Bilateral upper extremity edema  noted, dependent. RUE PICC in place, brachial. Skin: Warm/dry, no rashes. L forearm with healed blisters.  Resolved Hospital Problem List:   Aspiration pneumonitis >> completed ABx 9/13, AKI from hypovolemia Acute kidney injury due to hypovolemia Steroid-induced hyperglycemia  Assessment & Plan:  Subarachnoid hemorrhage with neurologic devastation, comatose, cerebral edema with brain compression and herniation syndrome Dismal neurological prognosis without any evidence of recovery with extended supportive care. - NSGY primary, appreciate Dr. 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 - Continue DDAVP 75mg (increased 10/17) - FWF 4069mQ3H - D5W at 10045mr - Trend Na - Minimize phlebotomy as able; patient does not accept blood transfusions  Hypotension due to hypovolemia caused by central DI - Continue NE for goal MAP > 65, downtitrating - Pressures remain labile; sensitive with volume shifts - Increase midodrine to '15mg'$  TID - Vasopressors continue to be a barrier to long-term placement  Acute renal failure, likely due to volume shifts from DI - Trend BMP - Replete electrolytes as indicated - Monitor I&Os - Avoid nephrotoxic agents as able - Ensure adequate renal perfusion - Not a dialysis candidate  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 - Monitor for active bleeding - Minimize phlebotomy - Does not accept blood transfusions  Nutrition / protein calorie malnutrition Receiving TF and considering PEG placement. IR consulted and considering timing, hope to  optimize volume status, hemodynamics, anemia. - PEG placement deferred 10/17 in the setting of Na; will d/w IR/anesthesia re: goal Na for safe PEG placement attempt - Optimize volume status/lytes as able - Will need definitive enteral access prior to placement  Best Practice (right click and "Reselect all SmartList Selections" daily)   Diet/type: tubefeeds and NPO w/ meds via tube  DVT prophylaxis: prophylactic heparin  GI prophylaxis: PPI Lines: Central line- RUE PICC Foley:  N/A Code Status:  full code; daughter updated at bedside 10/19AM  Critical care time:   The patient is critically ill with multiple organ system failure and requires high complexity decision making for assessment and support, frequent evaluation and titration of therapies, advanced monitoring, review of radiographic studies and interpretation of complex data.   Critical Care Time devoted to patient care services, exclusive of separately billable procedures, described in this note is 31 minutes.  SteLestine MountA-C [place] Pulmonary & Critical Care 11/13/21 7:37 AM  Please see [email or web address] for pager details.  From 7A-7P if no response, please call [phone] After hours, please call ELink [phone]

## 2021-11-13 NOTE — TOC Progression Note (Signed)
Transition of Care [facility]) - Progression Note    Patient Details  Name: [name] MRN: [id number] Date of Birth: [birth year]  Transition of Care [facility]) CM/SW [place], LCSW Phone Number: 11/13/2021, 9:29 AM  Clinical Narrative:    TOC continuing to follow for medical progression.      Barriers to Discharge: Continued Medical Work up  Expected Discharge Plan and Services     Discharge Planning Services: CM Consult   Living arrangements for the past 2 months: Single Family Home                                       Social Determinants of Health (SDOH) Interventions    Readmission Risk Interventions     No data to display

## 2021-11-14 ENCOUNTER — Inpatient Hospital Stay (HOSPITAL_COMMUNITY): Payer: Medicare Other

## 2021-11-14 DIAGNOSIS — I609 Nontraumatic subarachnoid hemorrhage, unspecified: Secondary | ICD-10-CM | POA: Diagnosis not present

## 2021-11-14 HISTORY — PX: IR GASTROSTOMY TUBE MOD SED: IMG625

## 2021-11-14 LAB — GLUCOSE, CAPILLARY
Glucose-Capillary: 87 mg/dL (ref 70–99)
Glucose-Capillary: 84 mg/dL (ref 70–99)
Glucose-Capillary: 80 mg/dL (ref 70–99)
Glucose-Capillary: 124 mg/dL — ABNORMAL HIGH (ref 70–99)
Glucose-Capillary: 85 mg/dL (ref 70–99)
Glucose-Capillary: 81 mg/dL (ref 70–99)

## 2021-11-14 LAB — BASIC METABOLIC PANEL
Anion gap: 11 (ref 5–15)
BUN: 25 mg/dL — ABNORMAL HIGH (ref 6–20)
CO2: 27 mmol/L (ref 22–32)
Calcium: 9.6 mg/dL (ref 8.9–10.3)
Chloride: 87 mmol/L — ABNORMAL LOW (ref 98–111)
Creatinine, Ser: 1.18 mg/dL — ABNORMAL HIGH (ref 0.44–1.00)
GFR, Estimated: 57 mL/min — ABNORMAL LOW (ref >60)
Glucose, Bld: 156 mg/dL — ABNORMAL HIGH (ref 70–99)
Potassium: 3.8 mmol/L (ref 3.5–5.1)
Sodium: 125 mmol/L — ABNORMAL LOW (ref 135–145)

## 2021-11-14 MED ORDER — PANCRELIPASE (LIP-PROT-AMYL) 10440-39150 UNITS PO TABS
20880.0000 [IU] | ORAL_TABLET | Freq: Once | ORAL | Status: AC
  Administered 2021-11-14: 20880 [IU]
  Filled 2021-11-14: qty 2

## 2021-11-14 MED ORDER — GLUCAGON HCL RDNA (DIAGNOSTIC) 1 MG IJ SOLR
INTRAMUSCULAR | Status: AC
  Filled 2021-11-14: qty 1

## 2021-11-14 MED ORDER — LIDOCAINE VISCOUS HCL 2 % MT SOLN
OROMUCOSAL | Status: AC
  Administered 2021-11-14: 5 mL
  Filled 2021-11-14: qty 15

## 2021-11-14 MED ORDER — VANCOMYCIN HCL IN DEXTROSE 1-5 GM/200ML-% IV SOLN
INTRAVENOUS | Status: AC | PRN
  Administered 2021-11-14: 1000 mg via INTRAVENOUS

## 2021-11-14 MED ORDER — FENTANYL CITRATE (PF) 100 MCG/2ML IJ SOLN
INTRAMUSCULAR | Status: AC | PRN
  Administered 2021-11-14 (×2): 50 ug via INTRAVENOUS

## 2021-11-14 MED ORDER — SODIUM BICARBONATE 650 MG PO TABS
650.0000 mg | ORAL_TABLET | Freq: Once | ORAL | Status: AC
  Administered 2021-11-14: 650 mg
  Filled 2021-11-14: qty 1

## 2021-11-14 MED ORDER — MIDAZOLAM HCL 2 MG/2ML IJ SOLN
INTRAMUSCULAR | Status: AC | PRN
  Administered 2021-11-14 (×2): 1 mg via INTRAVENOUS

## 2021-11-14 MED ORDER — VANCOMYCIN HCL IN DEXTROSE 1-5 GM/200ML-% IV SOLN
INTRAVENOUS | Status: AC
  Filled 2021-11-14: qty 200

## 2021-11-14 MED ORDER — LIDOCAINE HCL 1 % IJ SOLN
INTRAMUSCULAR | Status: AC
  Administered 2021-11-14: 15 mL
  Filled 2021-11-14: qty 20

## 2021-11-14 MED ORDER — MIDAZOLAM HCL 2 MG/2ML IJ SOLN
INTRAMUSCULAR | Status: AC
  Filled 2021-11-14: qty 4

## 2021-11-14 MED ORDER — GLUCAGON HCL (RDNA) 1 MG IJ SOLR
INTRAMUSCULAR | Status: AC | PRN
  Administered 2021-11-14: 1 mg via INTRAVENOUS

## 2021-11-14 MED ORDER — FENTANYL CITRATE (PF) 100 MCG/2ML IJ SOLN
INTRAMUSCULAR | Status: AC
  Filled 2021-11-14: qty 4

## 2021-11-14 MED ORDER — IOHEXOL 300 MG/ML  SOLN
50.0000 mL | Freq: Once | INTRAMUSCULAR | Status: AC | PRN
  Administered 2021-11-14: 25 mL

## 2021-11-14 MED ORDER — DEXTROSE IN LACTATED RINGERS 5 % IV SOLN: INTRAVENOUS | Status: DC

## 2021-11-14 NOTE — TOC Progression Note (Addendum)
Transition of Care [facility]) - Progression Note    Patient Details  Name: [name] MRN: [id number] Date of Birth: 06-06-1975  Transition of Care [facility]) CM/SW [place], LCSW Phone Number: 11/14/2021, 9:31 AM  Clinical Narrative:    CSW following for PEG placement and medical stability. In order to prepare for eventual Vent SNF placement, CSW notes there are only three Vent SNF facilities in [place]:  -[facility]: Does not accept patients with [facility]): email: [email or web address] .[email or web address]): Fax #: [phone]     Barriers to Discharge: Continued Medical Work up  Expected Discharge Plan and Services     Discharge Planning Services: CM Consult   Living arrangements for the past 2 months: Single Family Home                                       Social Determinants of Health (SDOH) Interventions    Readmission Risk Interventions     No data to display

## 2021-11-14 NOTE — Progress Notes (Signed)
Patient ID: [name], female   DOB: Aug 05, 1975, 46 y.o.   MRN: [id number] BP 96/63   Pulse 65   Temp (!) 94.5 F (34.7 C) Comment: Diana notified; Retail banker applied by RN  Resp 18   Ht '5\' 2"'$  (1.575 m)   Wt 63.6 kg   SpO2 100%   BMI 25.65 kg/m  Feeding tube placed.  Family can now pursue long term care.  No neurological improvement or change

## 2021-11-14 NOTE — Progress Notes (Signed)
NAME:  [name], MRN:  [id number], DOB:  06/07/1975, LOS: 48 ADMISSION DATE:  09/30/2021, CONSULTATION DATE: 10/15/2021 REFERRING MD: EDP, CHIEF COMPLAINT: Subarachnoid hemorrhage  History of Present Illness:  46 yo female former smoker was found unresponsive by her husband and EMS called.  She was making gurgling noises with her breathing.  She was found to have SVT and EMS performed DCCV.  She had GCS 4 in ER and intubated for airway protection.  Found to have large SAH and neurosurgery arranged for hospital admission.  PCCM consulted to assist with management in ICU.  Pertinent Medical History:  Hypertension, uterine fibroids Legally blind in left eye  Significant Hospital Events: Including procedures, antibiotic start and stop dates in addition to other pertinent events   9/07 Presented unresponsive, hypotensive; CT head with subarachnoid hemorrhage 9/11 DDAVP give for Central DI, repeat head CT with worsening infarcts and herniation 9/12 Attempted transfer for second opinion at [facility] and [facility].  No beds at [facility] and transfer was declined at [facility] 9/13 No change in mental status.  Transfer to [facility] declined 9/14 [facility] declined transfer again.  No change.  DDAVP x 1 9/18 palliative care consulted 9/23 Na increased >> restart DDAVP and increase free water 9/25 Plan for trach. Increased pressor requirement. Added midodrine  9/26 D/c D5W. Weaning NE as able. On I/O cath protocol  9/27 Worse renal fxn and new hyperkalemia  9/30 Renal function improving 10/5 Off levo 10/6 2023 plan for PEG tube 10/7 BPs elevated in AM, monitoring 10/17 PEG deferred in the setting of severe hypernatremia (173), DI. 10/18 No significant neurologic change. Na improved to 152, remains on DDAVP with FWF. Weaning NE as able, BP remains labile. Midodrine initiated. 10/19 No significant neurologic change. DDAVP discontinued. BP improved with addition of midodrine, weaning NE. Increased midodrine to '15mg'$   TID. 62/69 Cortrak clogged this morning, awaiting PEG placement. Na 125 (134), discontinued D5W and FWF.  Interim History / Subjective:  No significant events overnight No significant change in neurologic exam Continues on midodrine, NE weaning down 36mg Na 125 (134), discontinued D5W and FWF PEG placement today, pending IR schedule Cortrak clogged, will need to replace if unable to troubleshoot/get PEG Husband updated at bedside  Objective:  Blood pressure 97/68, pulse 70, temperature (!) 97.5 F (36.4 C), temperature source Oral, resp. rate 18, height '5\' 2"'$  (1.575 m), weight 63.6 kg, SpO2 100 %.    Vent Mode: PRVC FiO2 (%):  [30 %] 30 % Set Rate:  [18 bmp] 18 bmp Vt Set:  [400 mL] 400 mL PEEP:  [5 cmH20] 5 cmH20 Plateau Pressure:  [17 cmH20-20 cmH20] 17 cmH20   Intake/Output Summary (Last 24 hours) at 11/14/2021 0733 Last data filed at 11/14/2021 0600 Gross per 24 hour  Intake 4114.45 ml  Output 1945 ml  Net 2169.45 ml    Filed Weights   11/09/21 0100 11/10/21 0500 11/11/21 0500  Weight: 66.8 kg 68.9 kg 63.6 kg   Physical Examination: General: Acutely ill-appearing middle-aged woman in NAD. HEENT: [place]/AT, anicteric sclera, L pupil 582mround/nonreactive, R pupil 4.39m46mound/nonreactive, moist mucous membranes, severe tongue edema with protrustion, inferior aspect of tongue with +maceration in the setting of copious clear secretions. Cortrak in place with bridle, currently clogged. Neck: Tracheostomy in place, midline with gauze/Mepilex dressing, scant clear-white secretions. Bilateral soft tissue neck swelling. Neuro: Comatose. Does not respond to verbal, tactile or noxious stimuli. Not following commands. Moves BLE spontaneously with stimulation. No spontaneous movement  of BUE. Withdraws to pain in BLE, does not withdraw in [place]. No cough/gag/corneals.  CV: RRR, no m/g/r. PULM: Breathing even and unlabored on vent (PEEP 5, FiO2 30%). Lung fields CTAB, very faint expiratory  wheeze. GI: Soft, nontender, nondistended. Normoactive bowel sounds. Extremities: No LE edema noted. Bilateral UE dependent edema to hands/forearms. Brachial PICC to RUE. Skin: Warm/dry, no rashes. Well-healed blisters to L forearm.  Resolved Hospital Problem List:   Aspiration pneumonitis >> completed ABx 9/13, AKI from hypovolemia Acute kidney injury due to hypovolemia Steroid-induced hyperglycemia  Assessment & Plan:  Subarachnoid hemorrhage with neurologic devastation, comatose, cerebral edema with brain compression and herniation syndrome Dismal neurological prognosis without any evidence of recovery with extended supportive care. - NSGY primary, appreciate Dr. 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 - DDAVP discontinued 10/19 - FWF, D5W held 10/20AM, given Na 125 - Trend Na - Minimize phlebotomy as able; patient does not accept blood transfusions  Hypotension due to hypovolemia caused by central DI - Pressures remain labile, sensitive with volume shifts - Continue NE for goal MAP > 65, weaning down with addition of midodrine - Continue midodrine '15mg'$  TID - Vasopressors remain a barrier to long-term facility placement  Acute renal failure, likely due to volume shifts from DI - Trend BMP - Replete electrolytes as indicated - Monitor I&Os - Avoid nephrotoxic agents as able - Ensure adequate renal perfusion - Not a dialysis candidate  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 - Monitor for active bleeding - Minimize  phlebotomy - Does not accept blood transfusions  Nutrition / protein calorie malnutrition Receiving TF and considering PEG placement. IR consulted and considering timing, hope to optimize volume status, hemodynamics, anemia. - PEG placement tentatively 10/20, pending IR schedule - Optimize volume status/lytes as able - Will require definitive enteral access prior to placement - If unable to get PEG 10/20, will troubleshoot/replace clogged Cortrak  Best Practice (right click and "Reselect all SmartList Selections" daily)   Diet/type: tubefeeds and NPO w/ meds via tube  DVT prophylaxis: prophylactic heparin  GI prophylaxis: PPI Lines: Central line - RUE PICC Foley:  N/A Code Status:  full code; husband updated at bedside 10/20AM  Critical care time:   The patient is critically ill with multiple organ system failure and requires high complexity decision making for assessment and support, frequent evaluation and titration of therapies, advanced monitoring, review of radiographic studies and interpretation of complex data.   Critical Care Time devoted to patient care services, exclusive of separately billable procedures, described in this note is 33 minutes.  Lestine Mount, PA-C [facility] 11/14/21 7:33 AM  Please see [email or web address] for pager details.  From 7A-7P if no response, please call [phone] After hours, please call ELink [phone]

## 2021-11-14 NOTE — Progress Notes (Signed)
Pt transported to IR and back to Rm 4N19 on the vent without any complications.

## 2021-11-14 NOTE — Procedures (Signed)
Interventional Radiology Procedure Note  Date of Procedure: 11/14/2021  Procedure: G tube placement   Findings:  1. G tube placement, 22 Fr    Complications: No immediate complications noted.   Estimated Blood Loss: minimal  Follow-up and Recommendations: 1. Refer to EMR for use    Albin Felling, MD  Vascular & Interventional Radiology  11/14/2021 4:42 PM

## 2021-11-15 DIAGNOSIS — I609 Nontraumatic subarachnoid hemorrhage, unspecified: Secondary | ICD-10-CM | POA: Diagnosis not present

## 2021-11-15 LAB — BASIC METABOLIC PANEL
Anion gap: 10 (ref 5–15)
BUN: 19 mg/dL (ref 6–20)
CO2: 26 mmol/L (ref 22–32)
Calcium: 10.4 mg/dL — ABNORMAL HIGH (ref 8.9–10.3)
Chloride: 98 mmol/L (ref 98–111)
Creatinine, Ser: 1.35 mg/dL — ABNORMAL HIGH (ref 0.44–1.00)
GFR, Estimated: 49 mL/min — ABNORMAL LOW (ref >60)
Glucose, Bld: 176 mg/dL — ABNORMAL HIGH (ref 70–99)
Potassium: 3.2 mmol/L — ABNORMAL LOW (ref 3.5–5.1)
Sodium: 134 mmol/L — ABNORMAL LOW (ref 135–145)

## 2021-11-15 LAB — GLUCOSE, CAPILLARY
Glucose-Capillary: 107 mg/dL — ABNORMAL HIGH (ref 70–99)
Glucose-Capillary: 118 mg/dL — ABNORMAL HIGH (ref 70–99)
Glucose-Capillary: 130 mg/dL — ABNORMAL HIGH (ref 70–99)
Glucose-Capillary: 143 mg/dL — ABNORMAL HIGH (ref 70–99)
Glucose-Capillary: 159 mg/dL — ABNORMAL HIGH (ref 70–99)
Glucose-Capillary: 147 mg/dL — ABNORMAL HIGH (ref 70–99)

## 2021-11-15 MED ORDER — POTASSIUM CHLORIDE 20 MEQ PO PACK
60.0000 meq | PACK | Freq: Two times a day (BID) | ORAL | Status: AC
  Administered 2021-11-15 (×2): 60 meq
  Filled 2021-11-15 (×2): qty 3

## 2021-11-15 MED ORDER — SODIUM CHLORIDE 0.9 % IV BOLUS
1000.0000 mL | Freq: Once | INTRAVENOUS | Status: AC
  Administered 2021-11-15: 1000 mL via INTRAVENOUS

## 2021-11-15 NOTE — Progress Notes (Signed)
NAME:  [name], MRN:  [id number], DOB:  Jun 16, 1975, LOS: 63 ADMISSION DATE:  10/01/2021, CONSULTATION DATE: 10/13/2021 REFERRING MD: EDP, CHIEF COMPLAINT: Subarachnoid hemorrhage  History of Present Illness:  46 yo female former smoker was found unresponsive by her husband and EMS called.  She was making gurgling noises with her breathing.  She was found to have SVT and EMS performed DCCV.  She had GCS 4 in ER and intubated for airway protection.  Found to have large SAH and neurosurgery arranged for hospital admission.  PCCM consulted to assist with management in ICU.  Pertinent Medical History:  Hypertension, uterine fibroids Legally blind in left eye  Significant Hospital Events: Including procedures, antibiotic start and stop dates in addition to other pertinent events   9/07 Presented unresponsive, hypotensive; CT head with subarachnoid hemorrhage 9/11 DDAVP give for Central DI, repeat head CT with worsening infarcts and herniation 9/12 Attempted transfer for second opinion at [facility] and [facility].  No beds at [facility] and transfer was declined at [facility] 9/13 No change in mental status.  Transfer to [facility] declined 9/14 [facility] declined transfer again.  No change.  DDAVP x 1 9/18 palliative care consulted 9/23 Na increased >> restart DDAVP and increase free water 9/25 Plan for trach. Increased pressor requirement. Added midodrine  9/26 D/c D5W. Weaning NE as able. On I/O cath protocol  9/27 Worse renal fxn and new hyperkalemia  9/30 Renal function improving 10/5 Off levo 10/6 2023 plan for PEG tube 10/7 BPs elevated in AM, monitoring 10/17 PEG deferred in the setting of severe hypernatremia (173), DI. 10/18 No significant neurologic change. Na improved to 152, remains on DDAVP with FWF. Weaning NE as able, BP remains labile. Midodrine initiated. 10/19 No significant neurologic change. DDAVP discontinued. BP improved with addition of midodrine, weaning NE. Increased midodrine to '15mg'$   TID. 10/20 Cortrak clogged this morning, awaiting PEG placement. Na 125 (134), discontinued D5W and FWF.  Interim History / Subjective:  No significant events since yesterday.  No significant change in neurologic function. Remains on a higher dose norepinephrine because she was unable to get midodrine due to clogged core track yesterday.  She did receive her dose last night after PEG tube was placed yesterday and her nor epi is continue to wean.  Currently at 8 mics.  Objective:  Blood pressure 111/79, pulse 73, temperature 97.7 F (36.5 C), temperature source Oral, resp. rate 18, height '5\' 2"'$  (1.575 m), weight 63.6 kg, SpO2 97 %.    Vent Mode: PRVC FiO2 (%):  [30 %-100 %] 30 % Set Rate:  [18 bmp] 18 bmp Vt Set:  [400 mL] 400 mL PEEP:  [5 cmH20] 5 cmH20 Plateau Pressure:  [17 cmH20-18 cmH20] 17 cmH20   Intake/Output Summary (Last 24 hours) at 11/15/2021 0844 Last data filed at 11/15/2021 0800 Gross per 24 hour  Intake 1985.3 ml  Output 5225 ml  Net -3239.7 ml   Filed Weights   11/09/21 0100 11/10/21 0500 11/11/21 0500  Weight: 66.8 kg 68.9 kg 63.6 kg   Physical Examination: General: Young female critically ill tracheostomy tube in place HEENT: NCAT, pupils 5 mm, sluggish reactive, severe protrusion and edema of the tongue, tracheostomy tube in place. Neck: The ostomy tube in place, Mepilex dressing under, no significant secretions Neuro: Comatose, spontaneous movements, withdraws to pain CV: Regular rate rhythm S1-S2 PULM: Lateral mechanically ventilated breath sounds GI:, Nontender nondistended Extremities: No significant edema, there is dependent edema in her upper extremities and  lower extremities but it is only trace Skin: No visible rash  Resolved Hospital Problem List:   Aspiration pneumonitis >> completed ABx 9/13, AKI from hypovolemia Acute kidney injury due to hypovolemia Steroid-induced hyperglycemia  Assessment & Plan:   Subarachnoid hemorrhage with  neurologic devastation, comatose, cerebral edema with brain compression and herniation syndrome Dismal neurological prognosis without any evidence of recovery with extended supportive care. Plan: Continued ongoing goals of care discussions Remains full code. Patient still requiring vasopressor support I suspect we will be able to wean off as we now have enteral access for midodrine. Continue to titrate down off of norepinephrine infusion. Patient will potentially need LTAC placement as early as next week.  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 hemorrhage Hypokalemia DDAVP held Free water flushes decreased Potassium replaced  Hypotension due to hypovolemia caused by central DI -blood pressure remains labile Continue norepinephrine to wean down to maintain MAP mean arterial pressure greater than 65 Continue midodrine '15mg'$   3 times daily  Acute renal failure, likely due to volume shifts from DI Plan: Continue to observe urine output Avoid nephrotoxic agents   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 Signs of active bleeding Minimize phlebotomy, patient does not accept blood transfusions  Nutrition / protein calorie malnutrition PEG placed on 11/14/2021  Best Practice (right click and "Reselect all SmartList Selections" daily)   Diet/type: tubefeeds and NPO w/ meds via tube  DVT prophylaxis: prophylactic heparin  GI prophylaxis: PPI Lines: Central line - RUE PICC Foley:  N/A Code Status:  full code; husband updated at bedside 10/20AM   This patient is critically ill with multiple organ system failure; which, requires frequent high complexity decision making, assessment, support, evaluation, and titration of therapies. This was completed through the application of advanced monitoring technologies and extensive interpretation of multiple databases. During this encounter critical care time  was devoted to patient care services described in this note for 32 minutes.  Garner Nash, DO [facility] 11/15/2021 8:44 AM

## 2021-11-15 NOTE — Progress Notes (Signed)
Pt seen and examined. Pt remains unchanged from a neurologic standpoint for 44 days without any meaningful neurologic function. Plan remains to transfer to long-term vent facility when otherwise medically stable. I continue to believe there is no realistic chance of any neurologic recovery.  Consuella Lose, MD [facility]

## 2021-11-16 ENCOUNTER — Inpatient Hospital Stay (HOSPITAL_COMMUNITY): Payer: Medicare Other

## 2021-11-16 DIAGNOSIS — I609 Nontraumatic subarachnoid hemorrhage, unspecified: Secondary | ICD-10-CM | POA: Diagnosis not present

## 2021-11-16 LAB — BASIC METABOLIC PANEL
Anion gap: 10 (ref 5–15)
Anion gap: 9 (ref 5–15)
BUN: 12 mg/dL (ref 6–20)
BUN: 16 mg/dL (ref 6–20)
CO2: 26 mmol/L (ref 22–32)
CO2: 23 mmol/L (ref 22–32)
Calcium: 11.1 mg/dL — ABNORMAL HIGH (ref 8.9–10.3)
Calcium: 10.6 mg/dL — ABNORMAL HIGH (ref 8.9–10.3)
Chloride: 120 mmol/L — ABNORMAL HIGH (ref 98–111)
Chloride: 127 mmol/L — ABNORMAL HIGH (ref 98–111)
Creatinine, Ser: 1.76 mg/dL — ABNORMAL HIGH (ref 0.44–1.00)
Creatinine, Ser: 1.77 mg/dL — ABNORMAL HIGH (ref 0.44–1.00)
GFR, Estimated: 35 mL/min — ABNORMAL LOW (ref >60)
GFR, Estimated: 35 mL/min — ABNORMAL LOW (ref >60)
Glucose, Bld: 155 mg/dL — ABNORMAL HIGH (ref 70–99)
Glucose, Bld: 144 mg/dL — ABNORMAL HIGH (ref 70–99)
Potassium: 3.4 mmol/L — ABNORMAL LOW (ref 3.5–5.1)
Potassium: 2.9 mmol/L — ABNORMAL LOW (ref 3.5–5.1)
Sodium: 156 mmol/L — ABNORMAL HIGH (ref 135–145)
Sodium: 159 mmol/L — ABNORMAL HIGH (ref 135–145)

## 2021-11-16 LAB — CBC
HCT: 27.1 % — ABNORMAL LOW (ref 36.0–46.0)
Hemoglobin: 8.5 g/dL — ABNORMAL LOW (ref 12.0–15.0)
MCH: 30.6 pg (ref 26.0–34.0)
MCHC: 31.4 g/dL (ref 30.0–36.0)
MCV: 97.5 fL (ref 80.0–100.0)
Platelets: 214 10*3/uL (ref 150–400)
RBC: 2.78 MIL/uL — ABNORMAL LOW (ref 3.87–5.11)
RDW: 20.2 % — ABNORMAL HIGH (ref 11.5–15.5)
WBC: 10.8 10*3/uL — ABNORMAL HIGH (ref 4.0–10.5)
nRBC: 1.6 % — ABNORMAL HIGH (ref 0.0–0.2)

## 2021-11-16 LAB — GLUCOSE, CAPILLARY
Glucose-Capillary: 110 mg/dL — ABNORMAL HIGH (ref 70–99)
Glucose-Capillary: 138 mg/dL — ABNORMAL HIGH (ref 70–99)
Glucose-Capillary: 110 mg/dL — ABNORMAL HIGH (ref 70–99)
Glucose-Capillary: 128 mg/dL — ABNORMAL HIGH (ref 70–99)
Glucose-Capillary: 130 mg/dL — ABNORMAL HIGH (ref 70–99)
Glucose-Capillary: 132 mg/dL — ABNORMAL HIGH (ref 70–99)

## 2021-11-16 LAB — URINALYSIS, ROUTINE W REFLEX MICROSCOPIC
Bilirubin Urine: NEGATIVE
Glucose, UA: NEGATIVE mg/dL
Ketones, ur: NEGATIVE mg/dL
Leukocytes,Ua: NEGATIVE
Nitrite: NEGATIVE
Protein, ur: NEGATIVE mg/dL
Specific Gravity, Urine: <1.005 — ABNORMAL LOW (ref 1.005–1.030)
pH: 6.5 (ref 5.0–8.0)

## 2021-11-16 LAB — URINALYSIS, MICROSCOPIC (REFLEX)

## 2021-11-16 MED ORDER — VANCOMYCIN HCL 1500 MG/300ML IV SOLN
1500.0000 mg | Freq: Once | INTRAVENOUS | Status: AC
  Administered 2021-11-16: 1500 mg via INTRAVENOUS
  Filled 2021-11-16: qty 300

## 2021-11-16 MED ORDER — FREE WATER
200.0000 mL | Status: DC
  Administered 2021-11-16 – 2021-11-28 (×74): 200 mL

## 2021-11-16 MED ORDER — SODIUM CHLORIDE 0.9 % IV SOLN
2.0000 g | Freq: Two times a day (BID) | INTRAVENOUS | Status: AC
  Administered 2021-11-16 – 2021-11-22 (×14): 2 g via INTRAVENOUS
  Filled 2021-11-16 (×14): qty 12.5

## 2021-11-16 MED ORDER — SODIUM CHLORIDE 0.9 % IV BOLUS
500.0000 mL | Freq: Once | INTRAVENOUS | Status: AC
  Administered 2021-11-16: 500 mL via INTRAVENOUS

## 2021-11-16 MED ORDER — SODIUM CHLORIDE 0.9 % IV BOLUS
1000.0000 mL | Freq: Once | INTRAVENOUS | Status: AC
  Administered 2021-11-16: 1000 mL via INTRAVENOUS

## 2021-11-16 MED ORDER — DESMOPRESSIN ACETATE 4 MCG/ML IJ SOLN
1.0000 ug | Freq: Every day | INTRAMUSCULAR | Status: DC
  Administered 2021-11-16 – 2021-11-19 (×4): 1 ug via INTRAVENOUS
  Filled 2021-11-16 (×5): qty 1

## 2021-11-16 MED ORDER — LACTATED RINGERS IV BOLUS
1000.0000 mL | Freq: Once | INTRAVENOUS | Status: AC
  Administered 2021-11-16: 1000 mL via INTRAVENOUS

## 2021-11-16 MED ORDER — VANCOMYCIN HCL 1250 MG/250ML IV SOLN
1250.0000 mg | INTRAVENOUS | Status: DC
  Filled 2021-11-16: qty 250

## 2021-11-16 NOTE — Progress Notes (Signed)
NAME:  [name], MRN:  [id number], DOB:  21-Jan-1976, LOS: 53 ADMISSION DATE:  10/14/2021, CONSULTATION DATE: 10/18/2021 REFERRING MD: EDP, CHIEF COMPLAINT: Subarachnoid hemorrhage  History of Present Illness:  46 yo female former smoker was found unresponsive by her husband and EMS called.  She was making gurgling noises with her breathing.  She was found to have SVT and EMS performed DCCV.  She had GCS 4 in ER and intubated for airway protection.  Found to have large SAH and neurosurgery arranged for hospital admission.  PCCM consulted to assist with management in ICU.  Pertinent Medical History:  Hypertension, uterine fibroids Legally blind in left eye  Significant Hospital Events: Including procedures, antibiotic start and stop dates in addition to other pertinent events   9/07 Presented unresponsive, hypotensive; CT head with subarachnoid hemorrhage 9/11 DDAVP give for Central DI, repeat head CT with worsening infarcts and herniation 9/12 Attempted transfer for second opinion at [facility] and [facility].  No beds at [facility] and transfer was declined at [facility] 9/13 No change in mental status.  Transfer to [facility] declined 9/14 [facility] declined transfer again.  No change.  DDAVP x 1 9/18 palliative care consulted 9/23 Na increased >> restart DDAVP and increase free water 9/25 Plan for trach. Increased pressor requirement. Added midodrine  9/26 D/c D5W. Weaning NE as able. On I/O cath protocol  9/27 Worse renal fxn and new hyperkalemia  9/30 Renal function improving 10/5 Off levo 10/6 2023 plan for PEG tube 10/7 BPs elevated in AM, monitoring 10/17 PEG deferred in the setting of severe hypernatremia (173), DI. 10/18 No significant neurologic change. Na improved to 152, remains on DDAVP with FWF. Weaning NE as able, BP remains labile. Midodrine initiated. 10/19 No significant neurologic change. DDAVP discontinued. BP improved with addition of midodrine, weaning NE. Increased midodrine to '15mg'$   TID. 10/20 Cortrak clogged this morning, awaiting PEG placement. Na 125 (134), discontinued D5W and FWF. 10/22 fever spiked, POD1 from PEG   Interim History / Subjective:   Fever spikes overnight.  Postop day 1 from PEG tube placement.  Had slight increasing dose in vasopressors yesterday afternoon.  Remains on higher doses of norepinephrine today.  Objective:  Blood pressure 119/83, pulse 95, temperature (!) 100.5 F (38.1 C), temperature source Axillary, resp. rate 18, height '5\' 2"'$  (1.575 m), weight 63.6 kg, SpO2 96 %.    Vent Mode: PRVC FiO2 (%):  [30 %-40 %] 40 % Set Rate:  [18 bmp] 18 bmp Vt Set:  [400 mL] 400 mL PEEP:  [5 cmH20] 5 cmH20 Plateau Pressure:  [18 cmH20-22 cmH20] 19 cmH20   Intake/Output Summary (Last 24 hours) at 11/16/2021 0802 Last data filed at 11/16/2021 0700 Gross per 24 hour  Intake 4708.62 ml  Output 5350 ml  Net -641.38 ml   Filed Weights   11/09/21 0100 11/10/21 0500 11/11/21 0500  Weight: 66.8 kg 68.9 kg 63.6 kg   Physical Examination: General: Young female critically ill tracheostomy tube in place HEENT: NCAT, large protrusion of tongue, tracheostomy tube in place Neck: No significant secretions Neuro: Patient is comatose, has spontaneous movements will withdrawal to pain CV: Regular rhythm, S1-S2 PULM: Bilaterally mechanically ventilated breath sounds GI:, Soft, nontender, nondistended Extremities: Dependent edema Skin: No visible rash  Resolved Hospital Problem List:   Aspiration pneumonitis >> completed ABx 9/13, AKI from hypovolemia Acute kidney injury due to hypovolemia Steroid-induced hyperglycemia  Assessment & Plan:   Subarachnoid hemorrhage with neurologic devastation, comatose, cerebral edema with  brain compression and herniation syndrome Dismal neurological prognosis without any evidence of recovery with extended supportive care. Plan: Continued ongoing discussions regarding goals of care Remains a full code. Still on  vasopressor support Has received definitive enteral access with the PEG tube placement and now tracheostomy tube support. Currently working through sepsis syndrome however will potentially need LTAC placement as early as next week.  Fever Plan: Patient has had persistent hypotension and shock.  She has an increase in her vasopressor use. Still remains on midodrine at this time. I believe that she would benefit from PICC line exchange at some point.  Or line holiday? We will start broad-spectrum antibiotics Check urinalysis and chest x-ray It is possible that the fever spike overnight could be related to PEG tube placement which can sometimes be seen postoperatively.  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 hemorrhage Hypokalemia DDAVP held Restart DDAVP 200 every 6  Hypotension due to hypovolemia caused by central DI -blood pressure remains labile Still remains on norepinephrine midodrine to '15mg'$ , 3 times daily  Acute renal failure, likely due to volume shifts from DI Plan: Continue observe urine output Avoid nephrotoxic agents  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 Signs of active bleeding Minimizing phlebotomy as patient does not accept blood transfusions  Nutrition / protein calorie malnutrition PEG tube placed on 11/14/2021  Best Practice (right click and "Reselect all SmartList Selections" daily)   Diet/type: tubefeeds and NPO w/ meds via tube  DVT prophylaxis: prophylactic heparin  GI prophylaxis: PPI Lines: Central line - RUE PICC Foley:  N/A Code Status:  full code; husband updated at bedside 10/20AM  This patient is critically ill with multiple organ system failure; which, requires frequent high complexity decision making, assessment, support, evaluation, and titration of therapies. This was completed through the application of advanced monitoring technologies and extensive  interpretation of multiple databases. During this encounter critical care time was devoted to patient care services described in this note for 34 minutes.  Garner Nash, DO [place] [facility] 11/16/2021 8:02 AM

## 2021-11-16 NOTE — Progress Notes (Signed)
Pharmacy Antibiotic Note  [name] is a 46 y.o. female admitted on 10/01/2021 with sepsis.  Pharmacy has been consulted for vancomycin/cefepime dosing. SCr trended back up to 1.76 today.  Noted hx of "hives" reaction to PCN listed in chart from 2013; however, patient has tolerated Unasyn, ceftriaxone, and cefepime here recently.  Plan: Cefepime 2g IV q12h Vancomycin '1500mg'$  IV x 1; then '1250mg'$  IV q48h. Goal AUC 400-550. Expected AUC: 450 SCr used: 1.76 Monitor clinical progress, c/s, renal function F/u de-escalation plan/LOT, vancomycin levels as indicated   Height: '5\' 2"'$  (157.5 cm) Weight: 63.6 kg (140 lb 3.4 oz) IBW/kg (Calculated) : 50.1  Temp (24hrs), Avg:98.9 F (37.2 C), Min:96 F (35.6 C), Max:102 F (38.9 C)  Recent Labs  Lab 11/09/21 1121 11/11/21 0543 11/12/21 0634 11/13/21 0814 11/14/21 0545 11/15/21 0529 11/16/21 0534  WBC 7.9 8.8  --   --   --   --   --   CREATININE  --  2.06* 1.57* 1.27* 1.18* 1.35* 1.76*    Estimated Creatinine Clearance: 34.6 mL/min (A) (by C-G formula based on SCr of 1.76 mg/dL (H)).    Allergies  Allergen Reactions   Penicillins Hives    Tolerated Unasyn during 09/2021 admission     Arturo Morton, PharmD, BCPS Please check AMION for all [place] contact numbers Clinical Pharmacist 11/16/2021 8:24 AM

## 2021-11-16 NOTE — Progress Notes (Signed)
Pt unchanged neurologically, no meaningful function. Increased pressor requirement last night. No change in neurosurgical plan.  Shawna Lose, MD [facility]

## 2021-11-16 NOTE — Progress Notes (Signed)
Patient's heart rate in the 130s and maxed on levo. Temp 102. Ice bath given. Contacted CMM.

## 2021-11-16 NOTE — Progress Notes (Addendum)
eLink Physician-Brief Progress Note Patient Name: [name] DOB: 1975/12/13 MRN: [id number]   Date of Service  11/16/2021  HPI/Events of Note  Retrospective entry - was called for borderline low Bps earlier. Patient was on 18 mic/min levophed. I gave her a NS bolus of 500 cc and that helped.   eICU Interventions  Called now for notification of HR 120. SBP 130s while on 20 mic/min levophed. RN also noted she was earlier on a warming blanket and now has a temp pf 102. Tylenol given and an ice bath is being given now . Continue temp control measures. Overall very poor prognosis.      Intervention Category Major Interventions: Respiratory failure - evaluation and management  [name] 11/16/2021, 3:35 AM  Addendum at 5:40 am Hypotension 1 liter NS bolus to be given Patient is on 20 mic//min levophed, Changed order to allow titration

## 2021-11-17 DIAGNOSIS — R6521 Severe sepsis with septic shock: Secondary | ICD-10-CM

## 2021-11-17 DIAGNOSIS — N179 Acute kidney failure, unspecified: Secondary | ICD-10-CM | POA: Diagnosis not present

## 2021-11-17 DIAGNOSIS — A419 Sepsis, unspecified organism: Secondary | ICD-10-CM | POA: Diagnosis not present

## 2021-11-17 DIAGNOSIS — I609 Nontraumatic subarachnoid hemorrhage, unspecified: Secondary | ICD-10-CM | POA: Diagnosis not present

## 2021-11-17 LAB — BASIC METABOLIC PANEL
Anion gap: 11 (ref 5–15)
BUN: 16 mg/dL (ref 6–20)
CO2: 24 mmol/L (ref 22–32)
Calcium: 9.7 mg/dL (ref 8.9–10.3)
Chloride: 122 mmol/L — ABNORMAL HIGH (ref 98–111)
Creatinine, Ser: 1.8 mg/dL — ABNORMAL HIGH (ref 0.44–1.00)
GFR, Estimated: 35 mL/min — ABNORMAL LOW (ref >60)
Glucose, Bld: 231 mg/dL — ABNORMAL HIGH (ref 70–99)
Potassium: 3.2 mmol/L — ABNORMAL LOW (ref 3.5–5.1)
Sodium: 157 mmol/L — ABNORMAL HIGH (ref 135–145)

## 2021-11-17 LAB — GLUCOSE, CAPILLARY
Glucose-Capillary: 119 mg/dL — ABNORMAL HIGH (ref 70–99)
Glucose-Capillary: 123 mg/dL — ABNORMAL HIGH (ref 70–99)
Glucose-Capillary: 162 mg/dL — ABNORMAL HIGH (ref 70–99)
Glucose-Capillary: 108 mg/dL — ABNORMAL HIGH (ref 70–99)
Glucose-Capillary: 125 mg/dL — ABNORMAL HIGH (ref 70–99)
Glucose-Capillary: 96 mg/dL (ref 70–99)

## 2021-11-17 MED ORDER — POTASSIUM CHLORIDE 10 MEQ/100ML IV SOLN
10.0000 meq | INTRAVENOUS | Status: AC
  Administered 2021-11-17 (×4): 10 meq via INTRAVENOUS
  Filled 2021-11-17 (×4): qty 100

## 2021-11-17 NOTE — Progress Notes (Signed)
Patient ID: [name], female   DOB: 04/10/1975, 46 y.o.   MRN: [id number] BP 105/66   Pulse 75   Temp (!) 94.2 F (34.6 C) (Axillary)   Resp 18   Ht '5\' 2"'$  (1.575 m)   Wt 63.6 kg   SpO2 100%   BMI 25.65 kg/m  Comatose, no change No improvement Pupils fixed, dilated No corneal reflexes, no oculocephalic reflex Family is investigating choices for placement

## 2021-11-17 NOTE — Progress Notes (Signed)
NAME:  EMMANUEL ERCOLE, MRN:  [id number], DOB:  1975/02/12, LOS: 65 ADMISSION DATE:  10/15/2021, CONSULTATION DATE: 09/27/2021 REFERRING MD: EDP, CHIEF COMPLAINT: Subarachnoid hemorrhage  History of Present Illness:  46 yo female former smoker was found unresponsive by her husband and EMS called.  She was making gurgling noises with her breathing.  She was found to have SVT and EMS performed DCCV.  She had GCS 4 in ER and intubated for airway protection.  Found to have large SAH and neurosurgery arranged for hospital admission.  PCCM consulted to assist with management in ICU.  Pertinent Medical History:  Hypertension, uterine fibroids Legally blind in left eye  Significant Hospital Events: Including procedures, antibiotic start and stop dates in addition to other pertinent events   9/07 Presented unresponsive, hypotensive; CT head with subarachnoid hemorrhage 9/11 DDAVP give for Central DI, repeat head CT with worsening infarcts and herniation 9/12 Attempted transfer for second opinion at [facility].  No beds at [facility] and transfer was declined at [facility] 9/13 No change in mental status.  Transfer to [place] declined 9/14 [facility] declined transfer again.  No change.  DDAVP x 1 9/18 palliative care consulted 9/23 Na increased >> restart DDAVP and increase free water 9/25 Plan for trach. Increased pressor requirement. Added midodrine  9/26 D/c D5W. Weaning NE as able. On I/O cath protocol  9/27 Worse renal fxn and new hyperkalemia  9/30 Renal function improving 10/5 Off levo 10/6 2023 plan for PEG tube 10/7 BPs elevated in AM, monitoring 10/17 PEG deferred in the setting of severe hypernatremia (173), DI. 10/18 No significant neurologic change. Na improved to 152, remains on DDAVP with FWF. Weaning NE as able, BP remains labile. Midodrine initiated. 10/19 No significant neurologic change. DDAVP discontinued. BP improved with addition of midodrine, weaning NE. Increased midodrine to '15mg'$   TID. 10/20 Cortrak clogged this morning, awaiting PEG placement. Na 125 (134), discontinued D5W and FWF. 10/22 fever spiked, POD1 from PEG   Interim History / Subjective:  Remain afebrile after initiation of antibiotics yesterday Continues to require vasopressor support, now increasing requirement  Objective:  Blood pressure 116/69, pulse 90, temperature 99.9 F (37.7 C), temperature source Axillary, resp. rate 18, height '5\' 2"'$  (1.575 m), weight 63.6 kg, SpO2 100 %.    Vent Mode: PRVC FiO2 (%):  [40 %] 40 % Set Rate:  [18 bmp] 18 bmp Vt Set:  [400 mL] 400 mL PEEP:  [5 cmH20] 5 cmH20 Plateau Pressure:  [15 cmH20-20 cmH20] 18 cmH20   Intake/Output Summary (Last 24 hours) at 11/17/2021 0854 Last data filed at 11/17/2021 0700 Gross per 24 hour  Intake 5354.8 ml  Output 3410 ml  Net 1944.8 ml   Filed Weights   11/10/21 0500 11/11/21 0500 11/17/21 0500  Weight: 68.9 kg 63.6 kg 63.6 kg   Physical Examination: General: Young female critically ill, tracheostomy tube in place HEENT: NCAT, large protrusion of tongue Neck: No significant secretions Neuro: Patient is comatose, has spontaneous movements will withdrawal to pain, absent brainstem reflexes including pupillary, corneal, cough and doll's eye CV: Regular rhythm, S1-S2 PULM: Bilaterally mechanically ventilated breath sounds GI:, Soft, nontender, nondistended Extremities: Dependent edema Skin: No visible rash  Resolved Hospital Problem List:   Aspiration pneumonitis >> completed ABx 9/13, AKI from hypovolemia Acute kidney injury due to hypovolemia Steroid-induced hyperglycemia  Assessment & Plan:   Subarachnoid hemorrhage with neurologic devastation, comatose, cerebral edema with brain compression and herniation syndrome Dismal neurological prognosis without any  evidence of recovery with extended supportive care. Continued ongoing discussions regarding goals of care Remains a full code. Has received definitive enteral  access with the PEG tube placement and now tracheostomy tube support. Currently working through sepsis syndrome however will potentially need LTAC placement as early as next week.  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 hemorrhage Hypokalemia Continue DDAVP Serum sodium is trending down Continue free water flushes Continue IV fluid with D5 LR Supplement electrolytes  Acute renal failure, likely due to volume shifts from DI Continue observe urine output Avoid nephrotoxic agents  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 Signs of active bleeding Minimizing phlebotomy as patient does not accept blood transfusions Hemoglobin remained stable  Nutrition / protein calorie malnutrition PEG tube placed on 11/14/2021 Continue tube feeds  Best Practice (right click and "Reselect all SmartList Selections" daily)   Diet/type: tubefeeds and NPO w/ meds via tube  DVT prophylaxis: prophylactic heparin  GI prophylaxis: PPI Lines: Central line - RUE PICC Foley:  N/A Code Status:  full code; 10/23: Patient's son was updated at bedside   Total critical care time: 33 minutes  Performed by: Jacky Kindle   Critical care time was exclusive of separately billable procedures and treating other patients.   Critical care was necessary to treat or prevent imminent or life-threatening deterioration.   Critical care was time spent personally by me on the following activities: development of treatment plan with patient and/or surrogate as well as nursing, discussions with consultants, evaluation of patient's response to treatment, examination of patient,  obtaining history from patient or surrogate, ordering and performing treatments and interventions, ordering and review of laboratory studies, ordering and review of radiographic studies, pulse oximetry and re-evaluation of patient's condition.   Jacky Kindle, MD [facility] See Amion for pager If no response to pager, please call [phone] until 7pm After 7pm, Please call E-link [phone]

## 2021-11-18 DIAGNOSIS — Z515 Encounter for palliative care: Secondary | ICD-10-CM | POA: Diagnosis not present

## 2021-11-18 DIAGNOSIS — J9611 Chronic respiratory failure with hypoxia: Secondary | ICD-10-CM | POA: Diagnosis not present

## 2021-11-18 DIAGNOSIS — I609 Nontraumatic subarachnoid hemorrhage, unspecified: Secondary | ICD-10-CM | POA: Diagnosis not present

## 2021-11-18 LAB — CBC
HCT: 19.9 % — ABNORMAL LOW (ref 36.0–46.0)
Hemoglobin: 5.9 g/dL — CL (ref 12.0–15.0)
MCH: 30.6 pg (ref 26.0–34.0)
MCHC: 29.6 g/dL — ABNORMAL LOW (ref 30.0–36.0)
MCV: 103.1 fL — ABNORMAL HIGH (ref 80.0–100.0)
Platelets: 123 10*3/uL — ABNORMAL LOW (ref 150–400)
RBC: 1.93 MIL/uL — ABNORMAL LOW (ref 3.87–5.11)
RDW: 20.9 % — ABNORMAL HIGH (ref 11.5–15.5)
WBC: 7.2 10*3/uL (ref 4.0–10.5)
nRBC: 1.1 % — ABNORMAL HIGH (ref 0.0–0.2)

## 2021-11-18 LAB — GLUCOSE, CAPILLARY
Glucose-Capillary: 111 mg/dL — ABNORMAL HIGH (ref 70–99)
Glucose-Capillary: 127 mg/dL — ABNORMAL HIGH (ref 70–99)
Glucose-Capillary: 132 mg/dL — ABNORMAL HIGH (ref 70–99)
Glucose-Capillary: 135 mg/dL — ABNORMAL HIGH (ref 70–99)
Glucose-Capillary: 139 mg/dL — ABNORMAL HIGH (ref 70–99)
Glucose-Capillary: 150 mg/dL — ABNORMAL HIGH (ref 70–99)

## 2021-11-18 LAB — CBC WITH DIFFERENTIAL/PLATELET
Abs Immature Granulocytes: 0 10*3/uL (ref 0.00–0.07)
Basophils Absolute: 0 10*3/uL (ref 0.0–0.1)
Basophils Relative: 0 %
Eosinophils Absolute: 0.2 10*3/uL (ref 0.0–0.5)
Eosinophils Relative: 3 %
HCT: 19.6 % — ABNORMAL LOW (ref 36.0–46.0)
Hemoglobin: 5.9 g/dL — CL (ref 12.0–15.0)
Lymphocytes Relative: 42 %
Lymphs Abs: 2.8 10*3/uL (ref 0.7–4.0)
MCH: 31.1 pg (ref 26.0–34.0)
MCHC: 30.1 g/dL (ref 30.0–36.0)
MCV: 103.2 fL — ABNORMAL HIGH (ref 80.0–100.0)
Monocytes Absolute: 0 10*3/uL — ABNORMAL LOW (ref 0.1–1.0)
Monocytes Relative: 0 %
Neutro Abs: 3.6 10*3/uL (ref 1.7–7.7)
Neutrophils Relative %: 55 %
Platelets: 116 10*3/uL — ABNORMAL LOW (ref 150–400)
RBC: 1.9 MIL/uL — ABNORMAL LOW (ref 3.87–5.11)
RDW: 21 % — ABNORMAL HIGH (ref 11.5–15.5)
WBC: 6.6 10*3/uL (ref 4.0–10.5)
nRBC: 0 /100 WBC
nRBC: 1.1 % — ABNORMAL HIGH (ref 0.0–0.2)

## 2021-11-18 LAB — COMPREHENSIVE METABOLIC PANEL
ALT: 26 U/L (ref 0–44)
AST: 32 U/L (ref 15–41)
Albumin: 1.6 g/dL — ABNORMAL LOW (ref 3.5–5.0)
Alkaline Phosphatase: 234 U/L — ABNORMAL HIGH (ref 38–126)
Anion gap: 6 (ref 5–15)
BUN: 21 mg/dL — ABNORMAL HIGH (ref 6–20)
CO2: 25 mmol/L (ref 22–32)
Calcium: 9.3 mg/dL (ref 8.9–10.3)
Chloride: 120 mmol/L — ABNORMAL HIGH (ref 98–111)
Creatinine, Ser: 1.9 mg/dL — ABNORMAL HIGH (ref 0.44–1.00)
GFR, Estimated: 32 mL/min — ABNORMAL LOW (ref >60)
Glucose, Bld: 138 mg/dL — ABNORMAL HIGH (ref 70–99)
Potassium: 3.5 mmol/L (ref 3.5–5.1)
Sodium: 151 mmol/L — ABNORMAL HIGH (ref 135–145)
Total Bilirubin: 0.6 mg/dL (ref 0.3–1.2)
Total Protein: 5.5 g/dL — ABNORMAL LOW (ref 6.5–8.1)

## 2021-11-18 LAB — DIC (DISSEMINATED INTRAVASCULAR COAGULATION)PANEL
D-Dimer, Quant: 10.21 ug/mL-FEU — ABNORMAL HIGH (ref 0.00–0.50)
Fibrinogen: 692 mg/dL — ABNORMAL HIGH (ref 210–475)
INR: 1.4 — ABNORMAL HIGH (ref 0.8–1.2)
Platelets: 117 10*3/uL — ABNORMAL LOW (ref 150–400)
Prothrombin Time: 17.2 seconds — ABNORMAL HIGH (ref 11.4–15.2)
Smear Review: NONE SEEN
aPTT: 61 seconds — ABNORMAL HIGH (ref 24–36)

## 2021-11-18 LAB — VITAMIN B12: Vitamin B-12: 2617 pg/mL — ABNORMAL HIGH (ref 180–914)

## 2021-11-18 LAB — IRON AND TIBC
Iron: 39 ug/dL (ref 28–170)
Saturation Ratios: 13 % (ref 10.4–31.8)
TIBC: 297 ug/dL (ref 250–450)
UIBC: 258 ug/dL

## 2021-11-18 LAB — FERRITIN: Ferritin: 272 ng/mL (ref 11–307)

## 2021-11-18 MED ORDER — MIDODRINE HCL 5 MG PO TABS
20.0000 mg | ORAL_TABLET | Freq: Three times a day (TID) | ORAL | Status: DC
  Administered 2021-11-18 – 2021-11-20 (×6): 20 mg
  Filled 2021-11-18 (×6): qty 4

## 2021-11-18 MED ORDER — CHLORHEXIDINE GLUCONATE CLOTH 2 % EX PADS
6.0000 | MEDICATED_PAD | Freq: Every day | CUTANEOUS | Status: DC
  Administered 2021-11-19 – 2021-11-24 (×6): 6 via TOPICAL

## 2021-11-18 NOTE — Progress Notes (Signed)
Patient ID: [name], female   DOB: Jul 31, 1975, 46 y.o.   MRN: [id number] BP 104/63   Pulse 76   Temp 98 F (36.7 C) (Axillary)   Resp 18   Ht '5\' 2"'$  (1.575 m)   Wt 63.6 kg   SpO2 100%   BMI 25.65 kg/m  Comatose. Remains on pressors Feeding tube in use.  No neurological changes. Placement will be difficult if [name] remains on pressors. Will speak with family tomorrow.

## 2021-11-18 NOTE — Progress Notes (Signed)
Patient ID: [name], female   DOB: 04-29-1975, 46 y.o.   MRN: [id number]    Progress Note from the Palliative Medicine Team at [facility]   Patient Name: [name]        Date: 11/18/2021 DOB: 11/23/1975  Age: 46 y.o. MRN#: [id number] Attending Physician: Ashok Pall, MD Primary Care Physician: Glendon Axe, MD Admit Date: 09/29/2021   Medical records reviewed   46 year old female with hypertension who presented to the emergency room after being found unresponsive by her husband.  Patient was last seen at her baseline around 6 AM, patient's husband noticed that she was "breathing funny" and making gurgling noises.  He attempted to wake her and she was unresponsive, 60 year old daughter called EMS.  He started CPR and upon EMS arrival the patient was in SVT and they performed DCCV.  Heart rate improved.  There was not improvement in her mental status.   GSA 4 on arrival to the emergency room, proceed with emergent intubation.  Large SAH on CT of the head.  Neurosurgery and PCCM consulted.  Significant Hospital events: 9/7-presented unresponsive, hypotensive 9/7-CT head with subarachnoid hemorrhage 9/11- DDAVP give for Central DI, repeat head CT with worsening infarcts and herniation 9/12- Attempted transfer for second opinion at [facility] and [facility].  No beds at [facility] and transfer was declined at [facility] 9/13-no change in mental status.  Transfer to [facility] declined 9/14- [facility] declined transfer again.   9/23 Na increased >> restart DDAVP and increase free water 9/25 plan for trach/PEG Increased pressor requirement. Added midodrine  9/27 Worse renal fxn and new hyperkalemia  9/30 Renal function improving 10/5 Off levo 10/6 2023 plan for PEG tube 10/7 BPs elevated in AM, monitoring 10/17 PEG deferred in the setting of severe hypernatremia (173), DI. 10/18 No significant neurologic change. Na improved to 152, remains on DDAVP with FWF. Weaning NE as able, BP remains labile.  Midodrine initiated. 10/19 No significant neurologic change. DDAVP discontinued. BP improved with addition of midodrine, weaning NE. Increased midodrine to '15mg'$  TID. 10/20 Cortrak clogged this morning, awaiting PEG placement. Na 125 (134), discontinued D5W and FWF. 10/22 fever spiked, POD1 from PEG     Initial  PMT consult on 10-13-21/ Walden Field NP.   Palliative medicine consult discontinued on 10/31/2021.  Palliative medicine reconsulted on 11/17/2021.  Unfortunately patient has made no neurologic improvement.  She remains with pupils that are fixed and dilated, no corneal reflexes.  In discussion with nursing management, [name] tells me that treatment team is anticipating and coordinating family meeting to discuss next steps in treatment plan.  This nurse practitioner has communicated to the treatment team that PMT will be available for needs and support, please contact PMT with needs and direction in how we can be of assistance.   Total time spent on the unit was 55 minutes, greater than 50% of the time was spent in counseling coordination of care   Wadie Lessen NP  Palliative Medicine Team Team Phone # [phone] Pager [phone]

## 2021-11-18 NOTE — Progress Notes (Signed)
NAME:  [name], MRN:  [id number], DOB:  11/26/1975, LOS: 33 ADMISSION DATE:  10/07/2021, CONSULTATION DATE: 10/22/2021 REFERRING MD: EDP, CHIEF COMPLAINT: Subarachnoid hemorrhage  History of Present Illness:  46 yo female former smoker was found unresponsive by her husband and EMS called.  She was making gurgling noises with her breathing.  She was found to have SVT and EMS performed DCCV.  She had GCS 4 in ER and intubated for airway protection.  Found to have large SAH and neurosurgery arranged for hospital admission.  PCCM consulted to assist with management in ICU.  Pertinent Medical History:  Hypertension, uterine fibroids Legally blind in left eye  Significant Hospital Events: Including procedures, antibiotic start and stop dates in addition to other pertinent events   9/07 Presented unresponsive, hypotensive; CT head with subarachnoid hemorrhage 9/11 DDAVP give for Central DI, repeat head CT with worsening infarcts and herniation 9/12 Attempted transfer for second opinion at [facility] and [facility].  No beds at [facility] and transfer was declined at [facility] 9/13 No change in mental status.  Transfer to [facility] declined 9/14 [facility] declined transfer again.  No change.  DDAVP x 1 9/18 palliative care consulted 9/23 Na increased >> restart DDAVP and increase free water 9/25 Plan for trach. Increased pressor requirement. Added midodrine  9/26 D/c D5W. Weaning NE as able. On I/O cath protocol  9/27 Worse renal fxn and new hyperkalemia  9/30 Renal function improving 10/5 Off levo 10/6 2023 plan for PEG tube 10/7 BPs elevated in AM, monitoring 10/17 PEG deferred in the setting of severe hypernatremia (173), DI. 10/18 No significant neurologic change. Na improved to 152, remains on DDAVP with FWF. Weaning NE as able, BP remains labile. Midodrine initiated. 10/19 No significant neurologic change. DDAVP discontinued. BP improved with addition of midodrine, weaning NE. Increased midodrine to '15mg'$   TID. 10/20 Cortrak clogged this morning, awaiting PEG placement. Na 125 (134), discontinued D5W and FWF. 10/22 fever spiked, POD1 from PEG   Interim History / Subjective:  NE weaned to 5 overnight. Lots of in-line secretions.  For DI remains on desmopressin 1 mcg IV daily, FWF 200 q4, d5/LR at 50/hr. UOP not excessive overnight.  Objective:  Blood pressure (!) 86/53, pulse 81, temperature (!) 97.5 F (36.4 C), temperature source Axillary, resp. rate 18, height '5\' 2"'$  (1.575 m), weight 63.6 kg, SpO2 100 %.    Vent Mode: PRVC FiO2 (%):  [40 %] 40 % Set Rate:  [18 bmp] 18 bmp Vt Set:  [400 mL] 400 mL PEEP:  [5 cmH20] 5 cmH20 Plateau Pressure:  [18 cmH20-22 cmH20] 22 cmH20   Intake/Output Summary (Last 24 hours) at 11/18/2021 0750 Last data filed at 11/18/2021 0600 Gross per 24 hour  Intake 4803.93 ml  Output 1725 ml  Net 3078.93 ml   Filed Weights   11/11/21 0500 11/17/21 0500 11/18/21 0500  Weight: 63.6 kg 63.6 kg 63.6 kg   Physical Examination: General appearance: 46 y.o., female, trach/vent in place  Eyes: pupils fixed, dilated HENT: 6 shiley flex in place Lungs: rhonchorous bl, equal chest rise CV: RRR, no murmur  Abdomen: Soft, non-tender; a little distended, BS hypoactive Extremities: 1+ dependent peripheral edema, warm Neuro: ?withdraws vs extensor response to noxious stim ble, writhes with in-line suctioning   Labs/imaging reviewed  Labs ordered, no new imaging  Resolved Hospital Problem List:   Aspiration pneumonitis >> completed ABx 9/13, AKI from hypovolemia Acute kidney injury due to hypovolemia Steroid-induced hyperglycemia  Assessment &  Plan:   Subarachnoid hemorrhage with neurologic devastation, comatose, cerebral edema with brain compression and herniation syndrome Dismal neurological prognosis without any evidence of recovery with extended supportive care. Continued ongoing discussions regarding goals of care. Remains a full code. - S/p trach/PEG,  continue supportive measures as beloew  Sepsis with septic shock due to VAP 10/22- Patient started with fever, x-ray chest suggestive of right lower lobe infiltrates.  - wean levo for map 65 - trach aspirate, follow cultures and narrow abx as able  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 hemorrhage Hypokalemia - awaiting AM labs - continue ddavp - no sharp increase in UOP overnight - continue free water flushes - continue IV fluid with D5 LR - correct electrolytes  Acute renal failure, likely due to volume shifts from DI, hypotension - trend bmet - avoid nephrotoxins  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 - Minimizing phlebotomy as patient does not accept blood transfusions  Nutrition / protein calorie malnutrition - PEG tube placed on 11/14/2021 - Continue tube feeds  Best Practice (right click and "Reselect all SmartList Selections" daily)   Diet/type: tubefeeds and NPO w/ meds via tube  DVT prophylaxis: prophylactic heparin  GI prophylaxis: PPI Lines: Central line - RUE PICC Foley:  N/A Code Status:  full code; 10/24: Patient's husband was updated at bedside   Total critical care time: 35 minutes  Performed [id number] Hood care time was exclusive of separately billable procedures and treating other patients.   Critical care was necessary to treat or prevent imminent or life-threatening deterioration.   Critical care was time spent personally by me on the following activities: development of treatment plan with patient and/or surrogate as well as nursing, discussions with consultants, evaluation of patient's response to treatment, examination of patient, obtaining history from patient or surrogate, ordering and performing treatments and interventions, ordering and review of laboratory studies, ordering and review of radiographic studies, pulse oximetry and re-evaluation  of patient's condition.   [place] for pager If no response to pager, please call [phone] until 7pm After 7pm, Please call E-link [phone]

## 2021-11-18 NOTE — TOC Progression Note (Signed)
Transition of Care [facility]) - Progression Note    Patient Details  Name: [name] MRN: [id number] Date of Birth: 01-Feb-1975  Transition of Care [facility]) CM/SW [place], LCSW Phone Number: 11/18/2021, 4:30 PM  Clinical Narrative:    CSW continuing to follow for [facility] SNF placement once medically stable.      Barriers to Discharge: Continued Medical Work up  Expected Discharge Plan and Services     Discharge Planning Services: CM Consult   Living arrangements for the past 2 months: Single Family Home                                       Social Determinants of Health (SDOH) Interventions    Readmission Risk Interventions     No data to display

## 2021-11-19 DIAGNOSIS — I609 Nontraumatic subarachnoid hemorrhage, unspecified: Secondary | ICD-10-CM | POA: Diagnosis not present

## 2021-11-19 LAB — GLUCOSE, CAPILLARY
Glucose-Capillary: 157 mg/dL — ABNORMAL HIGH (ref 70–99)
Glucose-Capillary: 111 mg/dL — ABNORMAL HIGH (ref 70–99)
Glucose-Capillary: 110 mg/dL — ABNORMAL HIGH (ref 70–99)
Glucose-Capillary: 100 mg/dL — ABNORMAL HIGH (ref 70–99)
Glucose-Capillary: 114 mg/dL — ABNORMAL HIGH (ref 70–99)
Glucose-Capillary: 95 mg/dL (ref 70–99)

## 2021-11-19 LAB — BASIC METABOLIC PANEL
Anion gap: 3 — ABNORMAL LOW (ref 5–15)
BUN: 24 mg/dL — ABNORMAL HIGH (ref 6–20)
CO2: 26 mmol/L (ref 22–32)
Calcium: 9.6 mg/dL (ref 8.9–10.3)
Chloride: 122 mmol/L — ABNORMAL HIGH (ref 98–111)
Creatinine, Ser: 1.72 mg/dL — ABNORMAL HIGH (ref 0.44–1.00)
GFR, Estimated: 36 mL/min — ABNORMAL LOW (ref >60)
Glucose, Bld: 242 mg/dL — ABNORMAL HIGH (ref 70–99)
Potassium: 3.5 mmol/L (ref 3.5–5.1)
Sodium: 151 mmol/L — ABNORMAL HIGH (ref 135–145)

## 2021-11-19 LAB — CORTISOL: Cortisol, Plasma: 1 ug/dL

## 2021-11-19 MED ORDER — SODIUM CHLORIDE 0.9 % IV SOLN
250.0000 mg | Freq: Once | INTRAVENOUS | Status: AC
  Administered 2021-11-19: 250 mg via INTRAVENOUS
  Filled 2021-11-19: qty 20

## 2021-11-19 NOTE — Progress Notes (Signed)
Patient ID: [name], female   DOB: [birth year], 46 y.o.   MRN: [id number] BP (!) 104/53   Pulse 90   Temp (!) 97.4 F (36.3 C) (Axillary)   Resp 18   Ht '5\' 2"'$  (1.575 m)   Wt 63.6 kg   SpO2 100%   BMI 25.65 kg/m  Comatose, no corneals, no oculocephalics No change  Spoke with family about current status. Unclear why palliative was contacted.  Family is looking for a location to place [name].

## 2021-11-19 NOTE — Progress Notes (Signed)
NAME:  SHAMAYA KAUER, MRN:  [id number], DOB:  October 21, 1975, LOS: 26 ADMISSION DATE:  10/11/2021, CONSULTATION DATE: 10/17/2021 REFERRING MD: EDP, CHIEF COMPLAINT: Subarachnoid hemorrhage  History of Present Illness:  46 yo female former smoker was found unresponsive by her husband and EMS called.  She was making gurgling noises with her breathing.  She was found to have SVT and EMS performed DCCV.  She had GCS 4 in ER and intubated for airway protection.  Found to have large SAH and neurosurgery arranged for hospital admission.  PCCM consulted to assist with management in ICU.  Pertinent Medical History:  Hypertension, uterine fibroids Legally blind in left eye  Significant Hospital Events: Including procedures, antibiotic start and stop dates in addition to other pertinent events   9/07 Presented unresponsive, hypotensive; CT head with subarachnoid hemorrhage 9/11 DDAVP give for Central DI, repeat head CT with worsening infarcts and herniation 9/12 Attempted transfer for second opinion at [name] and [facility].  No beds at [facility] and transfer was declined at [facility] 9/13 No change in mental status.  Transfer to [facility] declined 9/14 [facility] declined transfer again.  No change.  DDAVP x 1 9/18 palliative care consulted 9/23 Na increased >> restart DDAVP and increase free water 9/25 Plan for trach. Increased pressor requirement. Added midodrine  9/26 D/c D5W. Weaning NE as able. On I/O cath protocol  9/27 Worse renal fxn and new hyperkalemia  9/30 Renal function improving 10/5 Off levo 10/6 2023 plan for PEG tube 10/7 BPs elevated in AM, monitoring 10/17 PEG deferred in the setting of severe hypernatremia (173), DI. 10/18 No significant neurologic change. Na improved to 152, remains on DDAVP with FWF. Weaning NE as able, BP remains labile. Midodrine initiated. 10/19 No significant neurologic change. DDAVP discontinued. BP improved with addition of midodrine, weaning NE. Increased midodrine to '15mg'$   TID. 10/20 Cortrak clogged this morning, awaiting PEG placement. Na 125 (134), discontinued D5W and FWF. 10/22 fever spiked, POD1 from PEG   Interim History / Subjective:  Midodrine increased, on low dose levo.  Na stable  She does not initiate breaths on PS before backup rate kicks in  Objective:  Blood pressure 95/61, pulse 73, temperature (!) 97.2 F (36.2 C), temperature source Axillary, resp. rate 18, height '5\' 2"'$  (1.575 m), weight 63.6 kg, SpO2 100 %.    Vent Mode: PRVC FiO2 (%):  [40 %] 40 % Set Rate:  [18 bmp] 18 bmp Vt Set:  [400 mL] 400 mL PEEP:  [5 cmH20] 5 cmH20 Plateau Pressure:  [16 cmH20-23 cmH20] 23 cmH20   Intake/Output Summary (Last 24 hours) at 11/19/2021 0819 Last data filed at 11/19/2021 0600 Gross per 24 hour  Intake 3022.88 ml  Output 2450 ml  Net 572.88 ml   Filed Weights   11/11/21 0500 11/17/21 0500 11/18/21 0500  Weight: 63.6 kg 63.6 kg 63.6 kg   Physical Examination: General appearance: 46 y.o., female, trach/vent in place Eyes: pupils fixed, dilated HENT: 6 shiley flex in place, tongue protruding Lungs: rhonchorous bl, equal chest rise CV: RRR, no murmur  Abdomen: Soft, non-tender; a little distended, BS hypoactive Extremities: 1+ dependent peripheral edema, warm Neuro: ?withdraws vs extensor response to noxious steim ble, writhes with in-line suctioning   Labs/imaging reviewed  Na stable  Trach aspirate 10/24 pending  Resolved Hospital Problem List:   Aspiration pneumonitis >> completed ABx 9/13, AKI from hypovolemia Steroid-induced hyperglycemia  Assessment & Plan:   Subarachnoid hemorrhage with neurologic devastation, comatose, cerebral edema  with brain compression and herniation syndrome Dismal neurological prognosis without any evidence of recovery with extended supportive care. Continued ongoing discussions regarding goals of care. Remains a full code. - s/p trach/peg, continue supportive measures as below  Sepsis with  septic shock due to VAP 10/22- Hypovolemic shock due to DI Patient started with fever, x-ray chest suggestive of right lower lobe infiltrates.  - consideration of TTE pending goals of care - wean levo for map 65 - trach aspirate, follow cultures and narrow as able - still appears hypovolemic on bedside US, suspect third spacing in addition to fluid loss from DI. Family discussing whether or not albumin would be acceptable to pt  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 hemorrhage Hypokalemia - continue ddavp - no sharp increase in UOP overnight - continue FWF - continue IV fluid with D5 LR - correct electrolytes  Acute renal failure, likely due to volume shifts from DI, hypotension - trend bmet - avoid nephrotoxins  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 - monitor clinically for s/s acute blood loss - Minimizing phlebotomy as patient does not accept blood transfusions - supplemental iron for goal iron saturation >20, EPO pending GoC  Nutrition / protein calorie malnutrition - PEG tube placed on 11/14/2021 - Continue tube feeds  Best Practice (right click and "Reselect all SmartList Selections" daily)   Diet/type: tubefeeds and NPO w/ meds via tube  DVT prophylaxis: prophylactic heparin  GI prophylaxis: PPI Lines: Central line - RUE PICC Foley:  N/A Code Status:  full code; 10/24: Patient's husband was updated at bedside. Tentative plan for [facility] discussion today led by Dr. Christella Noa.   Total critical care time: 33 minutes  Performed [id number] [place] care time was exclusive of separately billable procedures and treating other patients.   Critical care was necessary to treat or prevent imminent or life-threatening deterioration.   Critical care was time spent personally by me on the following activities: development of treatment plan with patient and/or surrogate as well as  nursing, discussions with consultants, evaluation of patient's response to treatment, examination of patient, obtaining history from patient or surrogate, ordering and performing treatments and interventions, ordering and review of laboratory studies, ordering and review of radiographic studies, pulse oximetry and re-evaluation of patient's condition.   [place] for pager If no response to pager, please call [phone] until 7pm After 7pm, Please call E-link [phone]

## 2021-11-19 NOTE — TOC CM/SW Note (Addendum)
11-19-2021  S/W  ANTHONY @  [facility]  : CONTACT FOR  D/C PLANNING NEEDS. PHONE #  [phone]

## 2021-11-20 ENCOUNTER — Inpatient Hospital Stay (HOSPITAL_COMMUNITY): Payer: Medicare Other

## 2021-11-20 DIAGNOSIS — I609 Nontraumatic subarachnoid hemorrhage, unspecified: Secondary | ICD-10-CM | POA: Diagnosis not present

## 2021-11-20 DIAGNOSIS — R571 Hypovolemic shock: Secondary | ICD-10-CM | POA: Diagnosis not present

## 2021-11-20 LAB — CULTURE, RESPIRATORY W GRAM STAIN

## 2021-11-20 LAB — ECHOCARDIOGRAM COMPLETE
AR max vel: 2.58 cm2
AV Area VTI: 2.28 cm2
AV Area mean vel: 2.37 cm2
AV Mean grad: 2 mmHg
AV Peak grad: 4.4 mmHg
Ao pk vel: 1.05 m/s
Area-P 1/2: 7.16 cm2
Height: 62 in
S' Lateral: 2.8 cm
Weight: 2634.94 oz

## 2021-11-20 LAB — BASIC METABOLIC PANEL
Anion gap: 4 — ABNORMAL LOW (ref 5–15)
BUN: 28 mg/dL — ABNORMAL HIGH (ref 6–20)
CO2: 27 mmol/L (ref 22–32)
Calcium: 10.1 mg/dL (ref 8.9–10.3)
Chloride: 123 mmol/L — ABNORMAL HIGH (ref 98–111)
Creatinine, Ser: 1.9 mg/dL — ABNORMAL HIGH (ref 0.44–1.00)
GFR, Estimated: 32 mL/min — ABNORMAL LOW (ref >60)
Glucose, Bld: 110 mg/dL — ABNORMAL HIGH (ref 70–99)
Potassium: 3.6 mmol/L (ref 3.5–5.1)
Sodium: 154 mmol/L — ABNORMAL HIGH (ref 135–145)

## 2021-11-20 LAB — GLUCOSE, CAPILLARY
Glucose-Capillary: 100 mg/dL — ABNORMAL HIGH (ref 70–99)
Glucose-Capillary: 124 mg/dL — ABNORMAL HIGH (ref 70–99)
Glucose-Capillary: 119 mg/dL — ABNORMAL HIGH (ref 70–99)
Glucose-Capillary: 158 mg/dL — ABNORMAL HIGH (ref 70–99)
Glucose-Capillary: 216 mg/dL — ABNORMAL HIGH (ref 70–99)
Glucose-Capillary: 169 mg/dL — ABNORMAL HIGH (ref 70–99)

## 2021-11-20 MED ORDER — INSULIN ASPART 100 UNIT/ML IJ SOLN
0.0000 [IU] | INTRAMUSCULAR | Status: DC
  Administered 2021-11-20: 2 [IU] via SUBCUTANEOUS
  Administered 2021-11-20: 3 [IU] via SUBCUTANEOUS
  Administered 2021-11-21: 2 [IU] via SUBCUTANEOUS
  Administered 2021-11-21: 1 [IU] via SUBCUTANEOUS
  Administered 2021-11-21 (×2): 2 [IU] via SUBCUTANEOUS
  Administered 2021-11-21 – 2021-12-03 (×12): 1 [IU] via SUBCUTANEOUS

## 2021-11-20 MED ORDER — DESMOPRESSIN ACETATE 4 MCG/ML IJ SOLN
1.0000 ug | Freq: Two times a day (BID) | INTRAMUSCULAR | Status: DC
  Administered 2021-11-20 – 2021-11-21 (×3): 1 ug via INTRAVENOUS
  Filled 2021-11-20 (×3): qty 1

## 2021-11-20 MED ORDER — HYDROCORTISONE SOD SUC (PF) 100 MG IJ SOLR
100.0000 mg | Freq: Once | INTRAMUSCULAR | Status: AC
  Administered 2021-11-20: 100 mg via INTRAVENOUS
  Filled 2021-11-20: qty 2

## 2021-11-20 MED ORDER — HYDROCORTISONE 20 MG PO TABS
20.0000 mg | ORAL_TABLET | Freq: Two times a day (BID) | ORAL | Status: DC
  Administered 2021-11-20 – 2021-11-21 (×4): 20 mg
  Filled 2021-11-20 (×5): qty 1

## 2021-11-20 NOTE — Progress Notes (Signed)
NAME:  [name], MRN:  [id number], DOB:  1975/12/26, LOS: 64 ADMISSION DATE:  10/03/2021, CONSULTATION DATE: 10/13/2021 REFERRING MD: EDP, CHIEF COMPLAINT: Subarachnoid hemorrhage  History of Present Illness:  46 yo female former smoker was found unresponsive by her husband and EMS called.  She was making gurgling noises with her breathing.  She was found to have SVT and EMS performed DCCV.  She had GCS 4 in ER and intubated for airway protection.  Found to have large SAH and neurosurgery arranged for hospital admission.  PCCM consulted to assist with management in ICU.  Pertinent Medical History:  Hypertension, uterine fibroids Legally blind in left eye  Significant Hospital Events: Including procedures, antibiotic start and stop dates in addition to other pertinent events   9/07 Presented unresponsive, hypotensive; CT head with subarachnoid hemorrhage 9/11 DDAVP give for Central DI, repeat head CT with worsening infarcts and herniation 9/12 Attempted transfer for second opinion at [facility] and [facility].  No beds at [facility] and transfer was declined at [facility] 9/13 No change in mental status.  Transfer to [facility] declined 9/14 [facility] declined transfer again.  No change.  DDAVP x 1 9/18 palliative care consulted 9/23 Na increased >> restart DDAVP and increase free water 9/25 Plan for trach. Increased pressor requirement. Added midodrine  9/26 D/c D5W. Weaning NE as able. On I/O cath protocol  9/27 Worse renal fxn and new hyperkalemia  9/30 Renal function improving 10/5 Off levo 10/6 2023 plan for PEG tube 10/7 BPs elevated in AM, monitoring 10/17 PEG deferred in the setting of severe hypernatremia (173), DI. 10/18 No significant neurologic change. Na improved to 152, remains on DDAVP with FWF. Weaning NE as able, BP remains labile. Midodrine initiated. 10/19 No significant neurologic change. DDAVP discontinued. BP improved with addition of midodrine, weaning NE. Increased midodrine to '15mg'$   TID. 10/20 Cortrak clogged this morning, awaiting PEG placement. Na 125 (134), discontinued D5W and FWF. 10/22 fever spiked, POD1 from PEG   Interim History / Subjective:  No acute issues overnight  Objective:  Blood pressure 114/68, pulse 74, temperature 98.7 F (37.1 C), temperature source Axillary, resp. rate 18, height '5\' 2"'$  (1.575 m), weight 74.7 kg, SpO2 100 %.    Vent Mode: PRVC FiO2 (%):  [40 %] 40 % Set Rate:  [18 bmp] 18 bmp Vt Set:  [400 mL] 400 mL PEEP:  [5 cmH20] 5 cmH20 Plateau Pressure:  [18 cmH20-20 cmH20] 19 cmH20   Intake/Output Summary (Last 24 hours) at 11/20/2021 0757 Last data filed at 11/20/2021 0700 Gross per 24 hour  Intake 5148.28 ml  Output 3205 ml  Net 1943.28 ml   Filed Weights   11/17/21 0500 11/18/21 0500 11/20/21 0500  Weight: 63.6 kg 63.6 kg 74.7 kg   Physical Examination: General appearance: 46 y.o., female, trach/vent in place Eyes: pupils fixed, dilated HENT: 6 shiley flex in place, tongue protruding Lungs: rhonchorous bl, equal chest rise CV: RRR, no murmur  Abdomen: Soft, non-tender; a little distended, BS hypoactive Extremities: 1+ dependent peripheral edema, warm Neuro: ?withdraws vs extensor response to noxious steim ble, writhes with in-line suctioning   Labs/imaging reviewed  Na rising  Trach aspirate 10/24 stenotrophomonas, susceptibilities pending  Resolved Hospital Problem List:   Aspiration pneumonitis >> completed ABx 9/13, AKI from hypovolemia Steroid-induced hyperglycemia  Assessment & Plan:   Subarachnoid hemorrhage with neurologic devastation, comatose, cerebral edema with brain compression and herniation syndrome Dismal neurological prognosis without any evidence of recovery with extended supportive  care. Continued ongoing discussions regarding goals of care. Remains a full code. - s/p trach/peg, continue supportive measures as below  Sepsis with septic shock due to VAP 10/22- Hypovolemic shock due to  DI Suspected relative adrenal insufficiency Patient started with fever, x-ray chest suggestive of right lower lobe infiltrates.  - TTE - wean levo for map 65 - trach aspirate, follow cultures and narrow ABX as able - start hydrocortisone 20 BID enteric - also still appears hypovolemic on bedside US, suspect third spacing in addition to fluid loss from DI. Family discussing whether or not albumin would be acceptable to pt  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 hemorrhage Hypokalemia - increase to ddavp  33mg BID  - continue FWF - continue IV fluid with D5 LR - correct electrolytes  Acute renal failure, likely due to volume shifts from DI, hypotension - trend bmet - avoid nephrotoxins  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 - monitor clinically for s/s acute blood loss - Minimizing phlebotomy as patient does not accept blood transfusions - supplemental iron for goal iron saturation >20, EPO pending GoC  Nutrition / protein calorie malnutrition - PEG tube placed on 11/14/2021 - Continue tube feeds  Best Practice (right click and "Reselect all SmartList Selections" daily)   Diet/type: tubefeeds and NPO w/ meds via tube  DVT prophylaxis: prophylactic heparin  GI prophylaxis: PPI Lines: Central line - RUE PICC Foley:  N/A Code Status:  full code; 10/25: Patient's husband was updated at bedside. GoC discussion under way led by Dr. CChristella Noa   Total critical care time: 32 minutes  Performed bWP:[id number] time was exclusive of separately billable procedures and treating other patients.   Critical care was necessary to treat or prevent imminent or life-threatening deterioration.   Critical care was time spent personally by me on the following activities: development of treatment plan with patient and/or surrogate as well as nursing, discussions with consultants,  evaluation of patient's response to treatment, examination of patient, obtaining history from patient or surrogate, ordering and performing treatments and interventions, ordering and review of laboratory studies, ordering and review of radiographic studies, pulse oximetry and re-evaluation of patient's condition.   [place] pager If no response to pager, please call [phone] 7pm After 7pm, Please call E-link [phone]

## 2021-11-20 NOTE — Progress Notes (Signed)
eLink Physician-Brief Progress Note Patient Name: [name] DOB: 06/30/1975 MRN: [id number]   Date of Service  11/20/2021  HPI/Events of Note  Hyperglycemia - Blood glucose = 216. Creatinine = 1.90. Patient on tube feeds.   eICU Interventions  Plan: Q 4 hour sensitive Novolog SSI.     Intervention Category Major Interventions: Hyperglycemia - active titration of insulin therapy  Lysle Dingwall 11/20/2021, 8:05 PM

## 2021-11-20 NOTE — Progress Notes (Signed)
Patient ID: [name], female   DOB: [birth year], 46 y.o.   MRN: [id number] BP (!) 144/84   Pulse 89   Temp 98 F (36.7 C) (Axillary)   Resp 18   Ht '5\' 2"'$  (1.575 m)   Wt 74.7 kg   SpO2 100%   BMI 30.12 kg/m  Comatose, no neurological changes Spoke with her daughter about the possibility of needing the brain death testing in order to be placed.

## 2021-11-20 NOTE — Progress Notes (Signed)
Nutrition Follow-up  DOCUMENTATION CODES:   Not applicable  INTERVENTION:   Continue TF via Cortrak tube: Osmolite 1.5 at 50 ml/h (1200 ml per day) Prosource TF20 60 ml daily   Provides 1880 kcal, 95 gm protein, 912 ml free water daily    Free water, 200 ml every 4 hours  Total free water: 2112 ml   Banatrol TF via tube BID, each serving provides 45 kcal and 5 gm soluble fiber.   NUTRITION DIAGNOSIS:   Inadequate oral intake related to inability to eat as evidenced by NPO status. Ongoing  GOAL:   Patient will meet greater than or equal to 90% of their needs Goal met via TF  MONITOR:   TF tolerance  REASON FOR ASSESSMENT:   Consult Enteral/tube feeding initiation and management  ASSESSMENT:   Pt with PMH of HTN and legally blind in L eye admitted after being found down by husband with CPR. Per CT pt with large SAH.  PEG placed 10/20. Currently receiving Osmolite 1.5 at 50 ml/h with Prosource TF20 60 ml once daily. Tolerating well.   Patient remains intubated on ventilator support via trach.   Medications: Banatrol TF, midodrine, Protonix, Miralax, Senokot-s  D5 LR @ 50 ml/hr  Levophed @ 1 mcg   Labs: sodium 154 Hgb: 5.9 (10/24) CBG's: 124-119  UOP: 3005 mL x 24 hours I/O's: +20.5 L since admission  Deep pitting generalized edema present per RN assessment.  Diet Order:   Diet Order             Diet NPO time specified Except for: Sips with Meds  Diet effective midnight                   EDUCATION NEEDS:   No education needs have been identified at this time  Skin:  Skin Assessment:  (L thigh skin tear)  Last BM:  10/26 type 7  Height:   Ht Readings from Last 1 Encounters:  10/07/21 _0  (1.575 m)    Weight:   Wt Readings from Last 1 Encounters:  11/20/21 74.7 kg   BMI:  Body mass index is 30.12 kg/m.  Estimated Nutritional Needs:   Kcal:  1700-1900  Protein:  85-100 grams  Fluid:  >1.7 L/day   Lucas Mallow RD, LDN,  CNSC Please refer to Amion for contact information.

## 2021-11-21 DIAGNOSIS — I609 Nontraumatic subarachnoid hemorrhage, unspecified: Secondary | ICD-10-CM | POA: Diagnosis not present

## 2021-11-21 LAB — GLUCOSE, CAPILLARY
Glucose-Capillary: 117 mg/dL — ABNORMAL HIGH (ref 70–99)
Glucose-Capillary: 124 mg/dL — ABNORMAL HIGH (ref 70–99)
Glucose-Capillary: 124 mg/dL — ABNORMAL HIGH (ref 70–99)
Glucose-Capillary: 156 mg/dL — ABNORMAL HIGH (ref 70–99)
Glucose-Capillary: 171 mg/dL — ABNORMAL HIGH (ref 70–99)
Glucose-Capillary: 197 mg/dL — ABNORMAL HIGH (ref 70–99)

## 2021-11-21 LAB — BASIC METABOLIC PANEL
Anion gap: 5 (ref 5–15)
BUN: 29 mg/dL — ABNORMAL HIGH (ref 6–20)
CO2: 28 mmol/L (ref 22–32)
Calcium: 10.4 mg/dL — ABNORMAL HIGH (ref 8.9–10.3)
Chloride: 121 mmol/L — ABNORMAL HIGH (ref 98–111)
Creatinine, Ser: 1.55 mg/dL — ABNORMAL HIGH (ref 0.44–1.00)
GFR, Estimated: 41 mL/min — ABNORMAL LOW (ref >60)
Glucose, Bld: 193 mg/dL — ABNORMAL HIGH (ref 70–99)
Potassium: 4 mmol/L (ref 3.5–5.1)
Sodium: 154 mmol/L — ABNORMAL HIGH (ref 135–145)

## 2021-11-21 MED ORDER — DESMOPRESSIN ACETATE 4 MCG/ML IJ SOLN
2.0000 ug | Freq: Two times a day (BID) | INTRAMUSCULAR | Status: DC
  Administered 2021-11-21 – 2021-11-26 (×11): 2 ug via INTRAVENOUS
  Filled 2021-11-21 (×11): qty 1

## 2021-11-21 NOTE — Progress Notes (Signed)
Patient ID: [name], female   DOB: 02/19/1975, 46 y.o.   MRN: [id number] BP (!) 173/107 (BP Location: Left Arm)   Pulse 79   Temp (!) 97 F (36.1 C) (Axillary) Comment: Lorraine notified  Resp 18   Ht '5\' 2"'$  (1.575 m)   Wt 74.7 kg   SpO2 100%   BMI 30.12 kg/m  Comatose, no corneals, no oculocephalics Exam is unchanged

## 2021-11-21 NOTE — NC FL2 (Signed)
[place] MEDICAID FL2 LEVEL OF CARE SCREENING TOOL     IDENTIFICATION  Patient Name: [name] Birthdate: [birth year] Sex: female Admission Date (Current Location): 10/24/2021  [facility] and [place] Number:  Herbalist and Address:  [facility] [place] [address] [place], [place] [address]      Provider Number: [id number]  Attending Physician Name and Address:  Candee Furbish, MD  Relative Name and Phone Number:       Current Level of Care: Hospital Recommended Level of Care: [place] Prior Approval Number:    Date Approved/Denied:   PASRR Number: [id number]  Discharge Plan: SNF    Current Diagnoses: Patient Active Problem List   Diagnosis Date Noted   Palliative care by specialist    Goals of care, counseling/discussion    Altered mental status    Chronic respiratory failure with hypoxia ([place])    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    AKI (acute kidney injury) ([place])    Hyperkalemia    Brain compression (HCC)    Hypokalemia    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[place])    [place] (subarachnoid hemorrhage) ([place]) 10/06/2021   Subarachnoid hematoma ([place]) 10/04/2021   Acute respiratory failure with hypoxia ([place])    Menorrhagia 07/31/2016    Orientation RESPIRATION BLADDER Height & Weight      (Unable to follow commands, responds to pain.)  Vent, Tracheostomy (Vent at 40 %FiO2; Shiley 85m Cuffed trach) Incontinent, Indwelling catheter Weight: 164 lb 10.9 oz (74.7 kg) Height:  '5\' 2"'$  (157.5 cm)  BEHAVIORAL SYMPTOMS/MOOD NEUROLOGICAL BOWEL NUTRITION STATUS      Incontinent Feeding tube (OSMOLITE 1.5 CAL; liquid 1,000 mL)  AMBULATORY STATUS COMMUNICATION OF NEEDS Skin   Total Care Does not communicate Skin abrasions (Skin tear on thigh)                       Personal Care Assistance Level of Assistance  Bathing, Feeding, Dressing Bathing Assistance: Maximum assistance Feeding assistance: Maximum  assistance Dressing Assistance: Maximum assistance     Functional Limitations Info  Sight Sight Info: Impaired        SPECIAL CARE FACTORS FREQUENCY   (Vent; Trach)                    Contractures Contractures Info: Not present    Additional Factors Info  Code Status, Allergies, Insulin Sliding Scale Code Status Info: Full Allergies Info: Penicillins   Insulin Sliding Scale Info: See dc summary       Current Medications (11/21/2021):  This is the current hospital active medication list Current Facility-Administered Medications  Medication Dose Route Frequency Provider Last Rate Last Admin   0.9 %  sodium chloride infusion  250 mL Intravenous Continuous Ogan, OKerry Kass MD       acetaminophen (TYLENOL) tablet 650 mg  650 mg Oral Q4H PRN JEustace Moore MD       Or   acetaminophen (TYLENOL) 160 MG/5ML solution 650 mg  650 mg Per Tube Q4H PRN JEustace Moore MD   650 mg at 11/20/21 0011   Or   acetaminophen (TYLENOL) suppository 650 mg  650 mg Rectal Q4H PRN JEustace Moore MD       ceFEPIme (MAXIPIME) 2 g in sodium chloride 0.9 % 100 mL IVPB  2 g Intravenous Q12H MMaryjane Hurter MD 200 mL/hr at 11/21/21 0914 2 g at 11/21/21 0914   Chlorhexidine Gluconate  Cloth 2 % PADS 6 each  6 each Topical Daily Maryjane Hurter, MD   6 each at 11/21/21 0606   desmopressin (DDAVP) injection 2 mcg  2 mcg Intravenous BID Maryjane Hurter, MD       dextrose 5 % in lactated ringers infusion   Intravenous Continuous Lestine Mount, PA-C 50 mL/hr at 11/21/21 0754 New Bag at 11/21/21 0754   feeding supplement (OSMOLITE 1.5 CAL) liquid 1,000 mL  1,000 mL Per Tube Continuous Mannam, Praveen, MD 50 mL/hr at 11/21/21 0600 Infusion Verify at 11/21/21 0600   feeding supplement (PROSource TF20) liquid 60 mL  60 mL Per Tube Daily Olalere, Adewale A, MD   60 mL at 11/21/21 0916   fiber supplement (BANATROL TF) liquid 60 mL  60 mL Per Tube BID Icard, Bradley L, DO   60 mL at 11/21/21 0919    free water 200 mL  200 mL Per Tube Q4H Icard, Bradley L, DO   200 mL at 11/21/21 1300   Gerhardt's butt cream   Topical BID Ashok Pall, MD   Given at 11/21/21 0916   heparin injection 5,000 Units  5,000 Units Subcutaneous Q8H [place] Crane M, PA-C   5,000 Units at 11/21/21 1305   hydrocortisone (CORTEF) tablet 20 mg  20 mg Per Tube BID Maryjane Hurter, MD   20 mg at 11/21/21 0915   insulin aspart (novoLOG) injection 0-9 Units  0-9 Units Subcutaneous Q4H Anders Simmonds, MD   2 Units at 11/21/21 1300   Oral care mouth rinse  15 mL Mouth Rinse Q2H Cabbell, Marylyn Ishihara, MD   15 mL at 11/21/21 1200   Oral care mouth rinse  15 mL Mouth Rinse PRN Ashok Pall, MD       pantoprazole (PROTONIX) 2 mg/mL oral suspension 40 mg  40 mg Per Tube Daily Ashok Pall, MD   40 mg at 11/21/21 0915   polyethylene glycol (MIRALAX / GLYCOLAX) packet 17 g  17 g Per Tube Daily Ashok Pall, MD   17 g at 11/19/21 1020   polyvinyl alcohol (LIQUIFILM TEARS) 1.4 % ophthalmic solution 2 drop  2 drop Both Eyes PRN Ashok Pall, MD   2 drop at 10/30/21 0944   senna-docusate (Senokot-S) tablet 1 tablet  1 tablet Per Tube BID Consuella Lose, MD   1 tablet at 11/19/21 2204   silver sulfADIAZINE (SILVADENE) 1 % cream   Topical Daily Jennelle Human B, NP   1 Application at 31/49/70 1050   sodium chloride flush (NS) 0.9 % injection 10-40 mL  10-40 mL Intracatheter Q12H Anders Simmonds, MD   30 mL at 11/21/21 0916   sodium chloride flush (NS) 0.9 % injection 10-40 mL  10-40 mL Intracatheter PRN Anders Simmonds, MD         Discharge Medications: Please see discharge summary for a list of discharge medications.  Relevant Imaging Results:  Relevant Lab Results:   Additional Information SSn: Shawna Alvena Kiernan, LCSW

## 2021-11-21 NOTE — Progress Notes (Signed)
NAME:  [name], MRN:  [id number], DOB:  Mar 09, 1975, LOS: 62 ADMISSION DATE:  10/13/2021, CONSULTATION DATE: 10/24/2021 REFERRING MD: EDP, CHIEF COMPLAINT: Subarachnoid hemorrhage  History of Present Illness:  46 yo female former smoker was found unresponsive by her husband and EMS called.  She was making gurgling noises with her breathing.  She was found to have SVT and EMS performed DCCV.  She had GCS 4 in ER and intubated for airway protection.  Found to have large SAH and neurosurgery arranged for hospital admission.  PCCM consulted to assist with management in ICU.  Pertinent Medical History:  Hypertension, uterine fibroids Legally blind in left eye  Significant Hospital Events: Including procedures, antibiotic start and stop dates in addition to other pertinent events   9/07 Presented unresponsive, hypotensive; CT head with subarachnoid hemorrhage 9/11 DDAVP give for Central DI, repeat head CT with worsening infarcts and herniation 9/12 Attempted transfer for second opinion at [facility] and [facility].  No beds at [facility] and transfer was declined at [facility] 9/13 No change in mental status.  Transfer to [facility] declined 9/14 [facility] declined transfer again.  No change.  DDAVP x 1 9/18 palliative care consulted 9/23 Na increased >> restart DDAVP and increase free water 9/25 Plan for trach. Increased pressor requirement. Added midodrine  9/26 D/c D5W. Weaning NE as able. On I/O cath protocol  9/27 Worse renal fxn and new hyperkalemia  9/30 Renal function improving 10/5 Off levo 10/6 2023 plan for PEG tube 10/7 BPs elevated in AM, monitoring 10/17 PEG deferred in the setting of severe hypernatremia (173), DI. 10/18 No significant neurologic change. Na improved to 152, remains on DDAVP with FWF. Weaning NE as able, BP remains labile. Midodrine initiated. 10/19 No significant neurologic change. DDAVP discontinued. BP improved with addition of midodrine, weaning NE. Increased midodrine to '15mg'$   TID. 10/20 Cortrak clogged this morning, awaiting PEG placement. Na 125 (134), discontinued D5W and FWF. 10/22 fever spiked, POD1 from PEG   Interim History / Subjective:  Weaned off of levo, midodrine after starting hydrocortisone  Objective:  Blood pressure 116/64, pulse 82, temperature 97.7 F (36.5 C), temperature source Axillary, resp. rate 18, height '5\' 2"'$  (1.575 m), weight 74.7 kg, SpO2 100 %.    Vent Mode: PRVC FiO2 (%):  [40 %] 40 % Set Rate:  [18 bmp] 18 bmp Vt Set:  [400 mL] 400 mL PEEP:  [5 cmH20] 5 cmH20 Plateau Pressure:  [20 cmH20-25 cmH20] 21 cmH20   Intake/Output Summary (Last 24 hours) at 11/21/2021 1024 Last data filed at 11/21/2021 0700 Gross per 24 hour  Intake 2670.71 ml  Output 3100 ml  Net -429.29 ml   Filed Weights   11/17/21 0500 11/18/21 0500 11/20/21 0500  Weight: 63.6 kg 63.6 kg 74.7 kg   Physical Examination: General appearance: 46 y.o., female, ttrach/ven in place, female, ttrach/ven in place Eyes: pupils fixed, dilated HENT: 6 shiley flex in place, tongue protruding Lungs: very rhonchorous bl, equal chest rise CV: RRR, no murmur  Abdomen: Soft, non-tender; a little distended, BS hypoactive Extremities: 1+ dependent peripheral edema, warm Neuro:?withdraws vs extensor response to noxious stim ble, writhes with in-line suctioning, not triggering breaths   Labs/imaging reviewed  Na stable  Trach aspirate 10/24 stenotrophomonas  TTE unremarkable  Resolved Hospital Problem List:   Aspiration pneumonitis >> completed ABx 9/13, AKI from hypovolemia Steroid-induced hyperglycemia  Assessment & Plan:   Subarachnoid hemorrhage with neurologic devastation, comatose, cerebral edema with brain compression and herniation syndrome Vent dependent respiratory  failure Dismal neurological prognosis without any evidence of recovery with extended supportive care. Continued ongoing discussions regarding goals of care. Remains a full code. - s/p trach/peg, continue supportive  measures as below  Sepsis with septic shock due to VAP 10/22-, resolved Hypovolemic shock due to DI, resolved Suspected relative adrenal insufficiency - She has clinically improved with cefepime will continue for 7d total course. 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 Hypokalemia - increase to ddavp 2 mcg BID  - continue FWF - continue IV fluid with D5 LR - correct electrolytes  Acute renal failure, likely due to volume shifts from DI, hypotension - trend bmet - avoid nephrotoxins  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 - monitor clinically for s/s acute blood loss - Minimizing phlebotomy as patient does not accept blood transfusions - supplemental iron for goal iron saturation >20, EPO pending GoC  Nutrition / protein calorie malnutrition - PEG tube placed on 11/14/2021 - Continue tube feeds  Best Practice (right click and "Reselect all SmartList Selections" daily)   Diet/type: tubefeeds and NPO w/ meds via tube  DVT prophylaxis: prophylactic heparin  GI prophylaxis: PPI Lines: Central line - RUE PICC Foley:  N/A Code Status:  full code; 10/27: Patient's son was updated at bedside. GoC discussion under way led by Dr. Christella Noa.   Total critical care time: 34 minutes  Performed BJ:SEGBTD [place] care time was exclusive of separately billable procedures and treating other patients.   Critical care was necessary to treat or prevent imminent or life-threatening deterioration.   Critical care was time spent personally by me on the following activities: development of treatment plan with patient and/or surrogate as well as nursing, discussions with consultants, evaluation of  patient's response to treatment, examination of patient, obtaining history from patient or surrogate, ordering and performing treatments and interventions, ordering and review of laboratory studies, ordering and review of radiographic studies, pulse oximetry and re-evaluation of patient's condition.   [place] for pager If no response to pager, please call [phone] until 7pm After 7pm, Please call E-link [phone]

## 2021-11-21 NOTE — TOC Progression Note (Signed)
Transition of Care [facility]) - Progression Note    Patient Details  Name: CHERICE GLENNIE MRN: [id number] Date of Birth: Aug 20, 1975  Transition of Care [facility]) CM/SW [place], LCSW Phone Number: 11/21/2021, 2:07 PM  Clinical Narrative:    CSW notified that patient is off of Levo and should begin Vent SNF search.  CSW spoke with patient's spouse and made him aware of plans to send referral to the two vent SNFs in [place] ([facility]). He stated that he had spoken with patient's insurance and wanted to know why we were not referring her to the one in the hospital. CSW explained that [facility] is an LTACH and they have reviewed her referral and indicated she is not appropriate for their level of care so CSW is completing search for Vent SNF level of care. CSW texted him names and addresses of the two facilities as requested and will send referrals.      Barriers to Discharge: Continued Medical Work up  Expected Discharge Plan and Services     Discharge Planning Services: CM Consult   Living arrangements for the past 2 months: Single Family Home                                       Social Determinants of Health (SDOH) Interventions    Readmission Risk Interventions     No data to display

## 2021-11-21 NOTE — TOC Progression Note (Signed)
Transition of Care [facility]) - Progression Note    Patient Details  Name: [name] MRN: [id number] Date of Birth: August 19, 1975  Transition of Care [facility]) CM/SW [place], LCSW Phone Number: 11/21/2021, 2:07 PM  Clinical Narrative:    CSW continuing to follow for Vent SNF placement once medically stable.    Barriers to Discharge: Continued Medical Work up  Expected Discharge Plan and Services     Discharge Planning Services: CM Consult   Living arrangements for the past 2 months: Single Family Home                                       Social Determinants of Health (SDOH) Interventions    Readmission Risk Interventions     No data to display

## 2021-11-22 DIAGNOSIS — I609 Nontraumatic subarachnoid hemorrhage, unspecified: Secondary | ICD-10-CM | POA: Diagnosis not present

## 2021-11-22 LAB — BASIC METABOLIC PANEL
Anion gap: 4 — ABNORMAL LOW (ref 5–15)
BUN: 26 mg/dL — ABNORMAL HIGH (ref 6–20)
CO2: 27 mmol/L (ref 22–32)
Calcium: 9.7 mg/dL (ref 8.9–10.3)
Chloride: 120 mmol/L — ABNORMAL HIGH (ref 98–111)
Creatinine, Ser: 1.13 mg/dL — ABNORMAL HIGH (ref 0.44–1.00)
GFR, Estimated: >60 mL/min (ref >60)
Glucose, Bld: 392 mg/dL — ABNORMAL HIGH (ref 70–99)
Potassium: 3.7 mmol/L (ref 3.5–5.1)
Sodium: 151 mmol/L — ABNORMAL HIGH (ref 135–145)

## 2021-11-22 LAB — GLUCOSE, CAPILLARY
Glucose-Capillary: 143 mg/dL — ABNORMAL HIGH (ref 70–99)
Glucose-Capillary: 138 mg/dL — ABNORMAL HIGH (ref 70–99)
Glucose-Capillary: 124 mg/dL — ABNORMAL HIGH (ref 70–99)
Glucose-Capillary: 128 mg/dL — ABNORMAL HIGH (ref 70–99)
Glucose-Capillary: 116 mg/dL — ABNORMAL HIGH (ref 70–99)
Glucose-Capillary: 120 mg/dL — ABNORMAL HIGH (ref 70–99)
Glucose-Capillary: 85 mg/dL (ref 70–99)

## 2021-11-22 MED ORDER — DARBEPOETIN ALFA 40 MCG/0.4ML IJ SOSY
40.0000 ug | PREFILLED_SYRINGE | INTRAMUSCULAR | Status: AC
  Administered 2021-11-22 – 2021-12-13 (×4): 40 ug via SUBCUTANEOUS
  Filled 2021-11-22 (×4): qty 0.4

## 2021-11-22 MED ORDER — POTASSIUM CHLORIDE 20 MEQ PO PACK
40.0000 meq | PACK | Freq: Once | ORAL | Status: AC
  Administered 2021-11-22: 40 meq
  Filled 2021-11-22: qty 2

## 2021-11-22 MED ORDER — HYDROCORTISONE 10 MG PO TABS
10.0000 mg | ORAL_TABLET | Freq: Two times a day (BID) | ORAL | Status: DC
  Administered 2021-11-22 – 2021-11-28 (×13): 10 mg
  Filled 2021-11-22 (×14): qty 1

## 2021-11-22 MED ORDER — SODIUM CHLORIDE 0.9 % IV SOLN
250.0000 mg | Freq: Every day | INTRAVENOUS | Status: DC
  Administered 2021-11-22: 250 mg via INTRAVENOUS
  Filled 2021-11-22: qty 20

## 2021-11-22 NOTE — Progress Notes (Signed)
NAME:  [name], MRN:  [id number], DOB:  May 23, 1975, LOS: 77 ADMISSION DATE:  10/13/2021, CONSULTATION DATE: 10/18/2021 REFERRING MD: EDP, CHIEF COMPLAINT: Subarachnoid hemorrhage  History of Present Illness:  46 yo female former smoker was found unresponsive by her husband and EMS called.  She was making gurgling noises with her breathing.  She was found to have SVT and EMS performed DCCV.  She had GCS 4 in ER and intubated for airway protection.  Found to have large SAH and neurosurgery arranged for hospital admission.  PCCM consulted to assist with management in ICU.  Pertinent Medical History:  Hypertension, uterine fibroids Legally blind in left eye  Significant Hospital Events: Including procedures, antibiotic start and stop dates in addition to other pertinent events   9/07 Presented unresponsive, hypotensive; CT head with subarachnoid hemorrhage 9/11 DDAVP give for Central DI, repeat head CT with worsening infarcts and herniation 9/12 Attempted transfer for second opinion at [facility] and [facility].  No beds at [facility] and transfer was declined at [facility] 9/13 No change in mental status.  Transfer to [facility] declined 9/14 [facility] declined transfer again.  No change.  DDAVP x 1 9/18 palliative care consulted 9/23 Na increased >> restart DDAVP and increase free water 9/25 Plan for trach. Increased pressor requirement. Added midodrine  9/26 D/c D5W. Weaning NE as able. On I/O cath protocol  9/27 Worse renal fxn and new hyperkalemia  9/30 Renal function improving 10/5 Off levo 10/6 2023 plan for PEG tube 10/7 BPs elevated in AM, monitoring 10/17 PEG deferred in the setting of severe hypernatremia (173), DI. 10/18 No significant neurologic change. Na improved to 152, remains on DDAVP with FWF. Weaning NE as able, BP remains labile. Midodrine initiated. 10/19 No significant neurologic change. DDAVP discontinued. BP improved with addition of midodrine, weaning NE. Increased midodrine to '15mg'$   TID. 10/20 Cortrak clogged this morning, awaiting PEG placement. Na 125 (134), discontinued D5W and FWF. 10/22 fever spiked, POD1 from PEG   Interim History / Subjective:  No events. Comatose on vent.  Objective:  Blood pressure (!) 144/91, pulse 83, temperature 97.8 F (36.6 C), temperature source Axillary, resp. rate 18, height '5\' 2"'$  (1.575 m), weight 74.7 kg, SpO2 100 %.    Vent Mode: PRVC FiO2 (%):  [40 %] 40 % Set Rate:  [18 bmp] 18 bmp Vt Set:  [400 mL] 400 mL PEEP:  [5 cmH20] [place] Pressure:  [21 cmH20-23 cmH20] 21 cmH20   Intake/Output Summary (Last 24 hours) at 11/22/2021 0805 Last data filed at 11/22/2021 0600 Gross per 24 hour  Intake 3742.07 ml  Output 1825 ml  Net 1917.07 ml    Filed Weights   11/17/21 0500 11/18/21 0500 11/20/21 0500  Weight: 63.6 kg 63.6 kg 74.7 kg   Physical Examination: Poorly responsive in bed Flexor posturing to pain No oculocephalic, corneal reflex Marked macroglossia noted Lungs scattered rhonci, passive on vent No edema   Cr improved Sugars up No new CBC  Resolved Hospital Problem List:   Aspiration pneumonitis >> completed ABx 9/13, AKI from hypovolemia Steroid-induced hyperglycemia  Assessment & Plan:   Subarachnoid hemorrhage with neurologic devastation, comatose, 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 Continued ongoing discussions regarding goals of care. Remains a full code. - s/p trach/peg, continue supportive measures as below including full vent support, VAP prevention bundle  Sepsis with septic shock due  to VAP 10/22-, resolved Hypovolemic shock due to DI, resolved Suspected relative adrenal insufficiency Seems improved with cefepime; has grown steno but thought to be colonizer at this time. 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 Hypokalemia - ddavp 2 mcg BID  - continue FWF - Stop D5LR - correct electrolytes: K today  Acute renal failure, likely due to volume shifts from DI, hypotension: improved - trend bmet - avoid nephrotoxins  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 - Minimizing phlebotomy as patient does not accept blood transfusions - start aranesp + iron  Nutrition / protein calorie malnutrition - PEG tube placed on 11/14/2021 - Continue tube feeds  Best Practice (right click and "Reselect all SmartList Selections" daily)   Diet/type: tubefeeds and NPO w/ meds via tube  DVT prophylaxis: prophylactic heparin  GI prophylaxis: PPI Lines: Central line - RUE PICC Foley:  N/A Code Status:  per primary  31 min cc time Erskine Emery MD PCCM

## 2021-11-23 DIAGNOSIS — I609 Nontraumatic subarachnoid hemorrhage, unspecified: Secondary | ICD-10-CM | POA: Diagnosis not present

## 2021-11-23 DIAGNOSIS — N179 Acute kidney failure, unspecified: Secondary | ICD-10-CM | POA: Diagnosis not present

## 2021-11-23 DIAGNOSIS — G935 Compression of brain: Secondary | ICD-10-CM | POA: Diagnosis not present

## 2021-11-23 LAB — CBC
HCT: 19.7 % — ABNORMAL LOW (ref 36.0–46.0)
Hemoglobin: 5.7 g/dL — CL (ref 12.0–15.0)
MCH: 31.1 pg (ref 26.0–34.0)
MCHC: 28.9 g/dL — ABNORMAL LOW (ref 30.0–36.0)
MCV: 107.7 fL — ABNORMAL HIGH (ref 80.0–100.0)
Platelets: 100 10*3/uL — ABNORMAL LOW (ref 150–400)
RBC: 1.83 MIL/uL — ABNORMAL LOW (ref 3.87–5.11)
RDW: 21.6 % — ABNORMAL HIGH (ref 11.5–15.5)
WBC: 9.8 10*3/uL (ref 4.0–10.5)
nRBC: 5.3 % — ABNORMAL HIGH (ref 0.0–0.2)

## 2021-11-23 LAB — GLUCOSE, CAPILLARY
Glucose-Capillary: 85 mg/dL (ref 70–99)
Glucose-Capillary: 81 mg/dL (ref 70–99)
Glucose-Capillary: 87 mg/dL (ref 70–99)
Glucose-Capillary: 104 mg/dL — ABNORMAL HIGH (ref 70–99)
Glucose-Capillary: 92 mg/dL (ref 70–99)
Glucose-Capillary: 67 mg/dL — ABNORMAL LOW (ref 70–99)

## 2021-11-23 LAB — BASIC METABOLIC PANEL
Anion gap: 6 (ref 5–15)
BUN: 30 mg/dL — ABNORMAL HIGH (ref 6–20)
CO2: 28 mmol/L (ref 22–32)
Calcium: 9.7 mg/dL (ref 8.9–10.3)
Chloride: 122 mmol/L — ABNORMAL HIGH (ref 98–111)
Creatinine, Ser: 0.91 mg/dL (ref 0.44–1.00)
GFR, Estimated: >60 mL/min (ref >60)
Glucose, Bld: 78 mg/dL (ref 70–99)
Potassium: 4.2 mmol/L (ref 3.5–5.1)
Sodium: 156 mmol/L — ABNORMAL HIGH (ref 135–145)

## 2021-11-23 LAB — MAGNESIUM: Magnesium: 2.4 mg/dL (ref 1.7–2.4)

## 2021-11-23 MED ORDER — DEXTROSE 50 % IV SOLN
12.5000 g | INTRAVENOUS | Status: AC
  Administered 2021-11-23: 12.5 g via INTRAVENOUS
  Filled 2021-11-23: qty 50

## 2021-11-23 NOTE — Progress Notes (Signed)
NAME:  [name], MRN:  [id number], DOB:  [birth year], LOS: 69 ADMISSION DATE:  10/21/2021, CONSULTATION DATE: 09/26/2021 REFERRING MD: EDP, CHIEF COMPLAINT: Subarachnoid hemorrhage  History of Present Illness:  46 yo female former smoker was found unresponsive by her husband and EMS called.  She was making gurgling noises with her breathing.  She was found to have SVT and EMS performed DCCV.  She had GCS 4 in ER and intubated for airway protection.  Found to have large SAH and neurosurgery arranged for hospital admission.  PCCM consulted to assist with management in ICU.  Pertinent Medical History:  Hypertension, uterine fibroids Legally blind in left eye  Significant Hospital Events: Including procedures, antibiotic start and stop dates in addition to other pertinent events   9/07 Presented unresponsive, hypotensive; CT head with subarachnoid hemorrhage 9/11 DDAVP give for Central DI, repeat head CT with worsening infarcts and herniation 9/12 Attempted transfer for second opinion at [facility] and [facility].  No beds at [facility] and transfer was declined at [facility] 9/13 No change in mental status.  Transfer to [facility] declined 9/14 [facility] declined transfer again.  No change.  DDAVP x 1 9/18 palliative care consulted 9/23 Na increased >> restart DDAVP and increase free water 9/25 Plan for trach. Increased pressor requirement. Added midodrine  9/26 D/c D5W. Weaning NE as able. On I/O cath protocol  9/27 Worse renal fxn and new hyperkalemia  9/30 Renal function improving 10/5 Off levo 10/6 2023 plan for PEG tube 10/7 BPs elevated in AM, monitoring 10/17 PEG deferred in the setting of severe hypernatremia (173), DI. 10/18 No significant neurologic change. Na improved to 152, remains on DDAVP with FWF. Weaning NE as able, BP remains labile. Midodrine initiated. 10/19 No significant neurologic change. DDAVP discontinued. BP improved with addition of midodrine, weaning NE. Increased midodrine to '15mg'$   TID. 10/20 Cortrak clogged this morning, awaiting PEG placement. Na 125 (134), discontinued D5W and FWF. 10/22 fever spiked, POD1 from PEG   Interim History / Subjective:  Patient hemoglobin dropped to 5.7, they have been refusing blood transfusion Serum sodium trended up to 156 Remained afebrile  Objective:  Blood pressure 137/88, pulse 86, temperature 99.5 F (37.5 C), temperature source Oral, resp. rate 18, height '5\' 2"'$  (1.575 m), weight 78.5 kg, SpO2 100 %.    Vent Mode: PRVC FiO2 (%):  [40 %] 40 % Set Rate:  [18 bmp] 18 bmp Vt Set:  [400 mL] 400 mL PEEP:  [5 cmH20] 5 cmH20 Plateau Pressure:  [19 cmH20-22 cmH20] 19 cmH20   Intake/Output Summary (Last 24 hours) at 11/23/2021 0739 Last data filed at 11/23/2021 0600 Gross per 24 hour  Intake 3508.88 ml  Output 1550 ml  Net 1958.88 ml   Filed Weights   11/18/21 0500 11/20/21 0500 11/23/21 0416  Weight: 63.6 kg 74.7 kg 78.5 kg   Physical Examination:   Physical exam: General: Crtitically ill-appearing female with a large protruding tongue, s/p trach HEENT: Eyes closed, atraumatic Neuro: Eyes closed, does not open, absent cough, gag, corneal, pupillary and oculocephalic reflexes. Extensor posturing with painful stimuli Chest: Coarse breath sounds, no wheezes or rhonchi Heart: Regular rate and rhythm, no murmurs or gallops Abdomen: Soft, nontender, nondistended, bowel sounds present Skin: No rash    Resolved Hospital Problem List:   Aspiration pneumonitis >> completed ABx 9/13, AKI from hypovolemia Steroid-induced hyperglycemia  Assessment & Plan:   Subarachnoid hemorrhage with neurologic devastation, comatose, cerebral edema with brain compression and herniation syndrome Vent  dependent respiratory failure Dismal neurological prognosis without any evidence of recovery with extended supportive care. 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 Hypokalemia, resolved Continue ddavp 2 mcg BID serum sodium remained at 156 Continue FWF Serum potassium is back to normal  Acute renal failure, likely due to volume shifts from DI, hypotension: improved Serum creatinine trended down to 0.9 Monitor intake and output Avoid nephrotoxic agents  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 Minimizing phlebotomy as patient does not accept blood transfusions Continue aranesp + iron Today hemoglobin is 5.7  Nutrition / protein calorie malnutrition PEG tube placed Continue tube feeds  Best Practice (right click and "Reselect all SmartList Selections" daily)   Diet/type: tubefeeds and NPO w/ meds via tube  DVT prophylaxis: prophylactic heparin  GI prophylaxis: PPI Lines: Central line - RUE PICC Foley:  N/A Code Status:  per primary    Total critical care time: 32 minutes  Performed by: Jacky Kindle   Critical care time was exclusive of separately billable procedures and treating other patients.   Critical care was necessary to treat or prevent imminent or life-threatening deterioration.   Critical care was time spent personally by me on the following activities: development of treatment plan with patient and/or surrogate as well as nursing, discussions with consultants, evaluation of patient's response to treatment, examination of patient, obtaining history from patient or surrogate, ordering and  performing treatments and interventions, ordering and review of laboratory studies, ordering and review of radiographic studies, pulse oximetry and re-evaluation of patient's condition.   Jacky Kindle, MD [facility] See Amion for pager If no response to pager, please call [phone] until 7pm After 7pm, Please call E-link [phone]

## 2021-11-23 NOTE — Progress Notes (Signed)
Date and time results received: 11/23/21 0730 (use smartphrase ".now" to insert current time)  Test: HGB Critical Value: 5.7  Name of Provider Notified: Chand MD  Orders Received? Or Actions Taken?:  MD aware, will speak with family about transfusion. Historically they have declined.

## 2021-11-24 DIAGNOSIS — S066X9A Traumatic subarachnoid hemorrhage with loss of consciousness of unspecified duration, initial encounter: Secondary | ICD-10-CM | POA: Diagnosis not present

## 2021-11-24 LAB — GLUCOSE, CAPILLARY
Glucose-Capillary: 113 mg/dL — ABNORMAL HIGH (ref 70–99)
Glucose-Capillary: 122 mg/dL — ABNORMAL HIGH (ref 70–99)
Glucose-Capillary: 171 mg/dL — ABNORMAL HIGH (ref 70–99)
Glucose-Capillary: 77 mg/dL (ref 70–99)
Glucose-Capillary: 83 mg/dL (ref 70–99)
Glucose-Capillary: 93 mg/dL (ref 70–99)
Glucose-Capillary: 98 mg/dL (ref 70–99)

## 2021-11-24 LAB — BASIC METABOLIC PANEL
Anion gap: 9 (ref 5–15)
BUN: 31 mg/dL — ABNORMAL HIGH (ref 6–20)
CO2: 26 mmol/L (ref 22–32)
Calcium: 9.3 mg/dL (ref 8.9–10.3)
Chloride: 117 mmol/L — ABNORMAL HIGH (ref 98–111)
Creatinine, Ser: 0.74 mg/dL (ref 0.44–1.00)
GFR, Estimated: >60 mL/min (ref >60)
Glucose, Bld: 109 mg/dL — ABNORMAL HIGH (ref 70–99)
Potassium: 4 mmol/L (ref 3.5–5.1)
Sodium: 152 mmol/L — ABNORMAL HIGH (ref 135–145)

## 2021-11-24 LAB — CBC
HCT: 20.2 % — ABNORMAL LOW (ref 36.0–46.0)
Hemoglobin: 5.7 g/dL — CL (ref 12.0–15.0)
MCH: 30.3 pg (ref 26.0–34.0)
MCHC: 28.2 g/dL — ABNORMAL LOW (ref 30.0–36.0)
MCV: 107.4 fL — ABNORMAL HIGH (ref 80.0–100.0)
Platelets: 112 10*3/uL — ABNORMAL LOW (ref 150–400)
RBC: 1.88 MIL/uL — ABNORMAL LOW (ref 3.87–5.11)
RDW: 21.3 % — ABNORMAL HIGH (ref 11.5–15.5)
WBC: 9.1 10*3/uL (ref 4.0–10.5)
nRBC: 3.6 % — ABNORMAL HIGH (ref 0.0–0.2)

## 2021-11-24 LAB — MAGNESIUM: Magnesium: 2.3 mg/dL (ref 1.7–2.4)

## 2021-11-24 LAB — PHOSPHORUS: Phosphorus: 2.9 mg/dL (ref 2.5–4.6)

## 2021-11-24 NOTE — Progress Notes (Signed)
Patient ID: [name], female   DOB: 1976-01-06, 46 y.o.   MRN: [id number] Comatose No corneals, no oculocephalics No neurological changes No neurological improvement

## 2021-11-24 NOTE — TOC Progression Note (Signed)
Transition of Care [facility]) - Progression Note    Patient Details  Name: [name] MRN: [id number] Date of Birth: 04/18/1975  Transition of Care [facility]) CM/SW [place], LCSW Phone Number: 11/24/2021, 3:35 PM  Clinical Narrative:    CSW provided update on facility search to patient's spouse.    Expected Discharge Plan: Skilled Nursing Facility Barriers to Discharge: Ship broker, SNF Pending bed offer  Expected Discharge Plan and Services Expected Discharge Plan: [place] In-house Referral: Clinical Social Work, Hospice / Palliative Care Discharge Planning Services: CM Consult Post Acute Care Choice: [place] Living arrangements for the past 2 months: Single Family Home                                       Social Determinants of Health (SDOH) Interventions    Readmission Risk Interventions     No data to display

## 2021-11-24 NOTE — Progress Notes (Signed)
NAME:  [name], MRN:  [id number], DOB:  [birth year], LOS: 59 ADMISSION DATE:  10/08/2021, CONSULTATION DATE: 10/09/2021 REFERRING MD: EDP, CHIEF COMPLAINT: Subarachnoid hemorrhage  History of Present Illness:  46 yo female former smoker was found unresponsive by her husband and EMS called.  She was making gurgling noises with her breathing.  She was found to have SVT and EMS performed DCCV.  She had GCS 4 in ER and intubated for airway protection.  Found to have large SAH and neurosurgery arranged for hospital admission.  PCCM consulted to assist with management in ICU.  Pertinent Medical History:  Hypertension, uterine fibroids Legally blind in left eye  Significant Hospital Events: Including procedures, antibiotic start and stop dates in addition to other pertinent events   9/07 Presented unresponsive, hypotensive; CT head with subarachnoid hemorrhage 9/11 DDAVP give for Central DI, repeat head CT with worsening infarcts and herniation 9/12 Attempted transfer for second opinion at [facility] and [facility].  No beds at [name] and transfer was declined at [facility] 9/13 No change in mental status.  Transfer to [facility] declined 9/14 [facility] declined transfer again.  No change.  DDAVP x 1 9/18 palliative care consulted 9/23 Na increased >> restart DDAVP and increase free water 9/25 Plan for trach. Increased pressor requirement. Added midodrine  9/26 D/c D5W. Weaning NE as able. On I/O cath protocol  9/27 Worse renal fxn and new hyperkalemia  9/30 Renal function improving 10/5 Off levo 10/6 2023 plan for PEG tube 10/7 BPs elevated in AM, monitoring 10/17 PEG deferred in the setting of severe hypernatremia (173), DI. 10/18 No significant neurologic change. Na improved to 152, remains on DDAVP with FWF. Weaning NE as able, BP remains labile. Midodrine initiated. 10/19 No significant neurologic change. DDAVP discontinued. BP improved with addition of midodrine, weaning NE. Increased midodrine to '15mg'$   TID. 10/20 Cortrak clogged this morning, awaiting PEG placement. Na 125 (134), discontinued D5W and FWF. 10/22 fever spiked, POD1 from PEG   Interim History / Subjective:  Remains comatose. TOC exploring long-term care options.   Objective:  Blood pressure 132/83, pulse 80, temperature 97.9 F (36.6 C), temperature source Axillary, resp. rate 18, height '5\' 2"'$  (1.575 m), weight 78.5 kg, SpO2 100 %.    Vent Mode: PRVC FiO2 (%):  [40 %] 40 % Set Rate:  [18 bmp] 18 bmp Vt Set:  [400 mL] 400 mL PEEP:  [5 cmH20] 5 cmH20 Plateau Pressure:  [18 cmH20-19 cmH20] 19 cmH20   Intake/Output Summary (Last 24 hours) at 11/24/2021 1218 Last data filed at 11/24/2021 0830 Gross per 24 hour  Intake 1310 ml  Output 1840 ml  Net -530 ml    Filed Weights   11/18/21 0500 11/20/21 0500 11/23/21 0416  Weight: 63.6 kg 74.7 kg 78.5 kg   Physical Examination:   Physical exam: General: Crtitically ill-appearing female with a large protruding tongue, s/p trach HEENT: Eyes closed, atraumatic Neuro: Eyes closed, does not open, absent cough, gag, corneal, pupillary and oculocephalic reflexes. Extensor posturing with painful stimuli Chest: Coarse breath sounds, no wheezes or rhonchi Heart: Regular rate and rhythm, no murmurs or gallops Abdomen: Soft, nontender, nondistended, bowel sounds present Skin: No rash  Resolved Hospital Problem List:   Aspiration pneumonitis >> completed ABx 9/13, AKI from hypovolemia Steroid-induced hyperglycemia  Assessment & Plan:   Subarachnoid hemorrhage with neurologic devastation, comatose, cerebral edema with brain compression and herniation syndrome Vent dependent respiratory failure Dismal neurological prognosis without any evidence of recovery with  extended supportive care. Continued ong oing discussions regarding goals of care S/p trach/peg, continue supportive measures as below including full vent support, VAP prevention bundle Try to attempt weaning again.    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 Hypokalemia, resolved Continue ddavp 2 mcg BID serum sodium remained at 156 Continue FWF Serum potassium is back to normal  Acute renal failure, likely due to volume shifts from DI, hypotension: improved Serum creatinine trended down to 0.9 Monitor intake and output Avoid nephrotoxic agents  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 Minimizing phlebotomy as patient does not accept blood transfusions Continue aranesp + iron Today hemoglobin is 5.7  Nutrition / protein calorie malnutrition PEG tube placed Continue tube feeds  Best Practice (right click and "Reselect all SmartList Selections" daily)   Diet/type: tubefeeds and NPO w/ meds via tube  DVT prophylaxis: prophylactic heparin  GI prophylaxis: PPI Lines: Central line - RUE PICC Foley:  N/A Code Status:  per primary  Kipp Brood, MD [facility] ICU Physician [place]  Pager: [phone] Or Epic Secure Chat After hours: [phone].  11/24/2021, 12:20 PM

## 2021-11-25 DIAGNOSIS — S066X9A Traumatic subarachnoid hemorrhage with loss of consciousness of unspecified duration, initial encounter: Secondary | ICD-10-CM | POA: Diagnosis not present

## 2021-11-25 LAB — CBC WITH DIFFERENTIAL/PLATELET
Abs Immature Granulocytes: 0.2 10*3/uL — ABNORMAL HIGH (ref 0.00–0.07)
Basophils Absolute: 0.1 10*3/uL (ref 0.0–0.1)
Basophils Relative: 1 %
Eosinophils Absolute: 0.1 10*3/uL (ref 0.0–0.5)
Eosinophils Relative: 1 %
HCT: 21.9 % — ABNORMAL LOW (ref 36.0–46.0)
Hemoglobin: 6.4 g/dL — CL (ref 12.0–15.0)
Lymphocytes Relative: 30 %
Lymphs Abs: 3.5 10*3/uL (ref 0.7–4.0)
MCH: 30.9 pg (ref 26.0–34.0)
MCHC: 29.2 g/dL — ABNORMAL LOW (ref 30.0–36.0)
MCV: 105.8 fL — ABNORMAL HIGH (ref 80.0–100.0)
Metamyelocytes Relative: 2 %
Monocytes Absolute: 0.1 10*3/uL (ref 0.1–1.0)
Monocytes Relative: 1 %
Neutro Abs: 7.5 10*3/uL (ref 1.7–7.7)
Neutrophils Relative %: 65 %
Platelets: 144 10*3/uL — ABNORMAL LOW (ref 150–400)
RBC: 2.07 MIL/uL — ABNORMAL LOW (ref 3.87–5.11)
RDW: 21.5 % — ABNORMAL HIGH (ref 11.5–15.5)
WBC: 11.6 10*3/uL — ABNORMAL HIGH (ref 4.0–10.5)
nRBC: 3 /100 WBC — ABNORMAL HIGH
nRBC: 3.4 % — ABNORMAL HIGH (ref 0.0–0.2)

## 2021-11-25 LAB — GLUCOSE, CAPILLARY
Glucose-Capillary: 112 mg/dL — ABNORMAL HIGH (ref 70–99)
Glucose-Capillary: 111 mg/dL — ABNORMAL HIGH (ref 70–99)
Glucose-Capillary: 108 mg/dL — ABNORMAL HIGH (ref 70–99)
Glucose-Capillary: 87 mg/dL (ref 70–99)
Glucose-Capillary: 89 mg/dL (ref 70–99)
Glucose-Capillary: 95 mg/dL (ref 70–99)

## 2021-11-25 MED ORDER — FAMOTIDINE 20 MG PO TABS
20.0000 mg | ORAL_TABLET | Freq: Every day | ORAL | Status: DC
  Administered 2021-11-25 – 2021-12-13 (×19): 20 mg
  Filled 2021-11-25 (×19): qty 1

## 2021-11-25 MED ORDER — HYDRALAZINE HCL 20 MG/ML IJ SOLN
10.0000 mg | INTRAMUSCULAR | Status: DC | PRN
  Administered 2021-11-26 – 2021-12-09 (×19): 10 mg via INTRAVENOUS
  Filled 2021-11-25 (×19): qty 1

## 2021-11-25 MED ORDER — CHLORHEXIDINE GLUCONATE CLOTH 2 % EX PADS
6.0000 | MEDICATED_PAD | Freq: Every day | CUTANEOUS | Status: DC
  Administered 2021-11-26 – 2021-12-31 (×35): 6 via TOPICAL

## 2021-11-25 NOTE — Progress Notes (Signed)
eLink Physician-Brief Progress Note Patient Name: DEAVION STRIDER DOB: 01/03/1976 MRN: [id number]   Date of Service  11/25/2021  HPI/Events of Note  Hypertension - BP = 167/109.   eICU Interventions  Plan: Hydralazine 10 mg IV Q 4 hours PRN SBP > 160 or DBP > 100.     Intervention Category Major Interventions: Hypertension - evaluation and management  Milani Lowenstein [place] 11/25/2021, 6:22 AM

## 2021-11-25 NOTE — Progress Notes (Signed)
NAME:  [name], MRN:  [id number], DOB:  09/09/1975, LOS: 36 ADMISSION DATE:  10/11/2021, CONSULTATION DATE: 10/12/2021 REFERRING MD: EDP, CHIEF COMPLAINT: Subarachnoid hemorrhage  History of Present Illness:  46 yo female former smoker was found unresponsive by her husband and EMS called.  She was making gurgling noises with her breathing.  She was found to have SVT and EMS performed DCCV.  She had GCS 4 in ER and intubated for airway protection.  Found to have large SAH and neurosurgery arranged for hospital admission.  PCCM consulted to assist with management in ICU.  Pertinent Medical History:  Hypertension, uterine fibroids Legally blind in left eye  Significant Hospital Events: Including procedures, antibiotic start and stop dates in addition to other pertinent events   9/07 Presented unresponsive, hypotensive; CT head with subarachnoid hemorrhage 9/11 DDAVP give for Central DI, repeat head CT with worsening infarcts and herniation 9/12 Attempted transfer for second opinion at [facility] and [facility].  No beds at [facility] and transfer was declined at [facility] 9/13 No change in mental status.  Transfer to [facility] declined 9/14 [facility] declined transfer again.  No change.  DDAVP x 1 9/18 palliative care consulted 9/23 Na increased >> restart DDAVP and increase free water 9/25 Plan for trach. Increased pressor requirement. Added midodrine  9/26 D/c D5W. Weaning NE as able. On I/O cath protocol  9/27 Worse renal fxn and new hyperkalemia  9/30 Renal function improving 10/5 Off levo 10/6 2023 plan for PEG tube 10/7 BPs elevated in AM, monitoring 10/17 PEG deferred in the setting of severe hypernatremia (173), DI. 10/18 No significant neurologic change. Na improved to 152, remains on DDAVP with FWF. Weaning NE as able, BP remains labile. Midodrine initiated. 10/19 No significant neurologic change. DDAVP discontinued. BP improved with addition of midodrine, weaning NE. Increased midodrine to '15mg'$   TID. 10/20 Cortrak clogged this morning, awaiting PEG placement. Na 125 (134), discontinued D5W and FWF. 10/22 fever spiked, POD1 from PEG   Interim History / Subjective:  Remains comatose. TOC exploring long-term care options.   Objective:  Blood pressure (!) 149/96, pulse 82, temperature 98.4 F (36.9 C), temperature source Axillary, resp. rate 18, height '5\' 2"'$  (1.575 m), weight 78.5 kg, SpO2 100 %.    Vent Mode: PRVC FiO2 (%):  [40 %] 40 % Set Rate:  [18 bmp] 18 bmp Vt Set:  [400 mL] 400 mL PEEP:  [5 cmH20] 5 cmH20 Plateau Pressure:  [15 cmH20-16 cmH20] 16 cmH20   Intake/Output Summary (Last 24 hours) at 11/25/2021 0845 Last data filed at 11/25/2021 0600 Gross per 24 hour  Intake 1900 ml  Output 1815 ml  Net 85 ml    Filed Weights   11/18/21 0500 11/20/21 0500 11/23/21 0416  Weight: 63.6 kg 74.7 kg 78.5 kg   Physical Examination:   Physical exam: General: Crtitically ill-appearing female with a large protruding tongue, s/p trach HEENT: Eyes closed, atraumatic Neuro: Eyes closed, does not open, absent cough, gag, corneal, pupillary and oculocephalic reflexes. Extensor posturing with painful stimuli Chest: Coarse breath sounds, no wheezes or rhonchi. No spontaneous respiration.  Heart: Regular rate and rhythm, no murmurs or gallops Abdomen: Soft, nontender, nondistended, bowel sounds present Skin: No rash  Resolved Hospital Problem List:   Aspiration pneumonitis >> completed ABx 9/13, AKI from hypovolemia Steroid-induced hyperglycemia  Assessment & Plan:   Subarachnoid hemorrhage with neurologic devastation, comatose, cerebral edema with brain compression and herniation syndrome Vent dependent respiratory failure Dismal neurological prognosis without  any evidence of recovery with extended supportive care. Continued ong oing discussions regarding goals of care S/p trach/peg, continue supportive measures as below including full vent support, VAP prevention  bundle Try to attempt weaning again.   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 Hypokalemia, resolved Continue ddavp 2 mcg BID serum sodium remained at 156 Continue FWF Serum potassium is back to normal  Acute renal failure, likely due to volume shifts from DI, hypotension: improved Serum creatinine trended down to 0.9 Monitor intake and output Avoid nephrotoxic agents  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 Minimizing phlebotomy as patient does not accept blood transfusions Continue aranesp + iron Today hemoglobin is 5.7  Nutrition / protein calorie malnutrition PEG tube placed Continue tube feeds  Best Practice (right click and "Reselect all SmartList Selections" daily)   Diet/type: tubefeeds and NPO w/ meds via tube  DVT prophylaxis: prophylactic heparin  GI prophylaxis: PPI Lines: Central line - RUE PICC Foley:  N/A Code Status:  per primary  Kipp Brood, MD [facility] ICU Physician [place]  Pager: [phone] Or Epic Secure Chat After hours: [phone].  11/25/2021, 8:45 AM

## 2021-11-25 NOTE — Progress Notes (Signed)
Patient ID: [name], female   DOB: 06-05-1975, 46 y.o.   MRN: [id number] BP (!) 161/105   Pulse 84   Temp 97.8 F (36.6 C) (Axillary)   Resp 18   Ht '5\' 2"'$  (1.575 m)   Wt 78.5 kg   SpO2 100%   BMI 31.65 kg/m  Comatose.  No neurological change, which also means no neurological improvement Working on finding a facility

## 2021-11-26 DIAGNOSIS — S066X9A Traumatic subarachnoid hemorrhage with loss of consciousness of unspecified duration, initial encounter: Secondary | ICD-10-CM | POA: Diagnosis not present

## 2021-11-26 LAB — CBC
HCT: 20.2 % — ABNORMAL LOW (ref 36.0–46.0)
Hemoglobin: 5.8 g/dL — CL (ref 12.0–15.0)
MCH: 30.2 pg (ref 26.0–34.0)
MCHC: 28.7 g/dL — ABNORMAL LOW (ref 30.0–36.0)
MCV: 105.2 fL — ABNORMAL HIGH (ref 80.0–100.0)
Platelets: 147 10*3/uL — ABNORMAL LOW (ref 150–400)
RBC: 1.92 MIL/uL — ABNORMAL LOW (ref 3.87–5.11)
RDW: 21.7 % — ABNORMAL HIGH (ref 11.5–15.5)
WBC: 9.1 10*3/uL (ref 4.0–10.5)
nRBC: 3 % — ABNORMAL HIGH (ref 0.0–0.2)

## 2021-11-26 LAB — GLUCOSE, CAPILLARY
Glucose-Capillary: 105 mg/dL — ABNORMAL HIGH (ref 70–99)
Glucose-Capillary: 127 mg/dL — ABNORMAL HIGH (ref 70–99)
Glucose-Capillary: 93 mg/dL (ref 70–99)
Glucose-Capillary: 94 mg/dL (ref 70–99)
Glucose-Capillary: 96 mg/dL (ref 70–99)
Glucose-Capillary: 99 mg/dL (ref 70–99)

## 2021-11-26 LAB — PREPARE RBC (CROSSMATCH)

## 2021-11-26 MED ORDER — SODIUM CHLORIDE 0.9% IV SOLUTION: Freq: Once | INTRAVENOUS | Status: AC

## 2021-11-26 NOTE — Progress Notes (Signed)
RT placed pt in CPAP/PS 12/5, pt did not tolerate well. Pt was apneic, no pt effort. RT placed pt back in full ventilatory support on previous settings. RT will continue to monitor pt.

## 2021-11-26 NOTE — Progress Notes (Signed)
NAME:  [name], MRN:  [id number], DOB:  1976/01/26, LOS: 35 ADMISSION DATE:  10/03/2021, CONSULTATION DATE: 09/30/2021 REFERRING MD: EDP, CHIEF COMPLAINT: Subarachnoid hemorrhage  History of Present Illness:  46 yo female former smoker was found unresponsive by her husband and EMS called.  She was making gurgling noises with her breathing.  She was found to have SVT and EMS performed DCCV.  She had GCS 4 in ER and intubated for airway protection.  Found to have large SAH and neurosurgery arranged for hospital admission.  PCCM consulted to assist with management in ICU.  Pertinent Medical History:  Hypertension, uterine fibroids Legally blind in left eye  Significant Hospital Events: Including procedures, antibiotic start and stop dates in addition to other pertinent events   9/07 Presented unresponsive, hypotensive; CT head with subarachnoid hemorrhage 9/11 DDAVP give for Central DI, repeat head CT with worsening infarcts and herniation 9/12 Attempted transfer for second opinion at [facility].  No beds at [facility] and transfer was declined at [facility] 9/13 No change in mental status.  Transfer to [facility] declined 9/14 [facility] declined transfer again.  No change.  DDAVP x 1 9/18 palliative care consulted 9/23 Na increased >> restart DDAVP and increase free water 9/25 Plan for trach. Increased pressor requirement. Added midodrine  9/26 D/c D5W. Weaning NE as able. On I/O cath protocol  9/27 Worse renal fxn and new hyperkalemia  9/30 Renal function improving 10/5 Off levo 10/6 2023 plan for PEG tube 10/7 BPs elevated in AM, monitoring 10/17 PEG deferred in the setting of severe hypernatremia (173), DI. 10/18 No significant neurologic change. Na improved to 152, remains on DDAVP with FWF. Weaning NE as able, BP remains labile. Midodrine initiated. 10/19 No significant neurologic change. DDAVP discontinued. BP improved with addition of midodrine, weaning NE. Increased midodrine to '15mg'$   TID. 10/20 Cortrak clogged this morning, awaiting PEG placement. Na 125 (134), discontinued D5W and FWF. 10/22 fever spiked, POD1 from PEG  11/1 persistent anemia of chronic disease.  Patient's family have been refusing blood transfusions.  Interim History / Subjective:  Remains comatose. TOC exploring long-term care options.   Objective:  Blood pressure (!) 130/94, pulse 74, temperature (!) 96.5 F (35.8 C), temperature source Axillary, resp. rate 18, height '5\' 2"'$  (1.575 m), weight 78.5 kg, SpO2 100 %.    Vent Mode: PRVC FiO2 (%):  [40 %] 40 % Set Rate:  [18 bmp] 18 bmp Vt Set:  [400 mL] 400 mL PEEP:  [5 cmH20] 5 cmH20 Plateau Pressure:  [10 cmH20-20 cmH20] 10 cmH20   Intake/Output Summary (Last 24 hours) at 11/26/2021 0827 Last data filed at 11/26/2021 0420 Gross per 24 hour  Intake 1400 ml  Output 1750 ml  Net -350 ml    Filed Weights   11/18/21 0500 11/20/21 0500 11/23/21 0416  Weight: 63.6 kg 74.7 kg 78.5 kg   Physical Examination:   Physical exam: General: Crtitically ill-appearing female with a large protruding tongue, s/p trach HEENT: Eyes closed, atraumatic Neuro: Eyes closed, does not open, absent cough, gag, corneal, pupillary and oculocephalic reflexes. Extensor posturing with painful stimuli Chest: Coarse breath sounds, no wheezes or rhonchi. No spontaneous respiration.  Heart: Regular rate and rhythm, no murmurs or gallops Abdomen: Soft, nontender, nondistended, bowel sounds present Skin: No rash  Resolved Hospital Problem List:   Aspiration pneumonitis >> completed ABx 9/13, AKI from hypovolemia Steroid-induced hyperglycemia  Assessment & Plan:   Subarachnoid hemorrhage with neurologic devastation, comatose, cerebral  edema with brain compression and herniation syndrome Vent dependent respiratory failure Dismal neurological prognosis without any evidence of recovery with extended supportive care. Continued ong oing discussions regarding goals of  care S/p trach/peg, continue supportive measures as below including full vent support, VAP prevention bundle Continues to fail attempted weaning  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 Hypokalemia, resolved Continue ddavp 2 mcg BID serum sodium remained at 156 Continue FWF Serum potassium is back to normal  Acute renal failure, likely due to volume shifts from DI, hypotension: improved Serum creatinine trended down to 0.9 Monitor intake and output Avoid nephrotoxic agents  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 Minimizing phlebotomy as patient does not accept blood transfusions Continue aranesp + iron Today hemoglobin is 5.8 on repeat.  Persistent anemia is becoming a barrier to placement. Family has agreed to blood transfusion.   Nutrition / protein calorie malnutrition PEG tube placed Continue tube feeds  Best Practice (right click and "Reselect all SmartList Selections" daily)   Diet/type: tubefeeds and NPO w/ meds via tube  DVT prophylaxis: prophylactic heparin  GI prophylaxis: PPI Lines: Central line - RUE PICC Foley:  N/A Code Status:  per primary  Kipp Brood, MD [facility] ICU Physician [place]  Pager: [phone] Or Epic Secure Chat After hours: [phone].  11/26/2021, 8:27 AM

## 2021-11-26 NOTE — Progress Notes (Signed)
Patient ID: [name], female   DOB: 05/23/1975, 46 y.o.   MRN: [id number] BP (!) 141/99   Pulse 85   Temp 97.7 F (36.5 C) (Axillary)   Resp 18   Ht '5\' 2"'$  (1.575 m)   Wt 78.5 kg   SpO2 100%   BMI 31.65 kg/m  Transfused 2 units PRBC Comatose, no corneals, oculoecephalics, cough, gag No neurological change. Prognosis is grim

## 2021-11-26 NOTE — TOC Progression Note (Signed)
Transition of Care [employer]) - Progression Note    Patient Details  Name: [name] MRN: [id number] Date of Birth: [birth year]  Transition of Care [facility]) CM/SW [place], LCSW Phone Number: 11/26/2021, 12:24 PM  Clinical Narrative:    [name] out to [facility] again regarding bed availability.    Expected Discharge Plan: Skilled Nursing Facility Barriers to Discharge: Ship broker, SNF Pending bed offer  Expected Discharge Plan and Services Expected Discharge Plan: [place] In-house Referral: Clinical Social Work, Hospice / Palliative Care Discharge Planning Services: CM Consult Post Acute Care Choice: [place] Living arrangements for the past 2 months: Single Family Home                                       Social Determinants of Health (SDOH) Interventions    Readmission Risk Interventions     No data to display

## 2021-11-27 DIAGNOSIS — I609 Nontraumatic subarachnoid hemorrhage, unspecified: Secondary | ICD-10-CM | POA: Diagnosis not present

## 2021-11-27 LAB — TYPE AND SCREEN
ABO/RH(D): B POS
Antibody Screen: NEGATIVE
Unit division: 0
Unit division: 0

## 2021-11-27 LAB — BASIC METABOLIC PANEL
Anion gap: 7 (ref 5–15)
BUN: 24 mg/dL — ABNORMAL HIGH (ref 6–20)
CO2: 24 mmol/L (ref 22–32)
Calcium: 8.5 mg/dL — ABNORMAL LOW (ref 8.9–10.3)
Chloride: 109 mmol/L (ref 98–111)
Creatinine, Ser: 0.57 mg/dL (ref 0.44–1.00)
GFR, Estimated: >60 mL/min (ref >60)
Glucose, Bld: 94 mg/dL (ref 70–99)
Potassium: 4.9 mmol/L (ref 3.5–5.1)
Sodium: 140 mmol/L (ref 135–145)

## 2021-11-27 LAB — LACTATE DEHYDROGENASE: LDH: 458 U/L — ABNORMAL HIGH (ref 98–192)

## 2021-11-27 LAB — GLUCOSE, CAPILLARY
Glucose-Capillary: 110 mg/dL — ABNORMAL HIGH (ref 70–99)
Glucose-Capillary: 75 mg/dL (ref 70–99)
Glucose-Capillary: 76 mg/dL (ref 70–99)
Glucose-Capillary: 93 mg/dL (ref 70–99)
Glucose-Capillary: 82 mg/dL (ref 70–99)
Glucose-Capillary: 109 mg/dL — ABNORMAL HIGH (ref 70–99)

## 2021-11-27 LAB — CBC
HCT: 27.1 % — ABNORMAL LOW (ref 36.0–46.0)
Hemoglobin: 8.7 g/dL — ABNORMAL LOW (ref 12.0–15.0)
MCH: 30.6 pg (ref 26.0–34.0)
MCHC: 32.1 g/dL (ref 30.0–36.0)
MCV: 95.4 fL (ref 80.0–100.0)
Platelets: 156 10*3/uL (ref 150–400)
RBC: 2.84 MIL/uL — ABNORMAL LOW (ref 3.87–5.11)
RDW: 21.9 % — ABNORMAL HIGH (ref 11.5–15.5)
WBC: 9.4 10*3/uL (ref 4.0–10.5)
nRBC: 4.4 % — ABNORMAL HIGH (ref 0.0–0.2)

## 2021-11-27 LAB — BPAM RBC
Blood Product Expiration Date: 202311062359
Blood Product Expiration Date: 202311172359
ISSUE DATE / TIME: 202311011032
ISSUE DATE / TIME: 202311011229
Unit Type and Rh: 1700
Unit Type and Rh: 7300

## 2021-11-27 MED ORDER — SODIUM CHLORIDE 0.9 % IV SOLN: 1.0000 g | Freq: Three times a day (TID) | INTRAVENOUS | Status: DC

## 2021-11-27 MED ORDER — VANCOMYCIN HCL IN DEXTROSE 1-5 GM/200ML-% IV SOLN: 1000.0000 mg | Freq: Three times a day (TID) | INTRAVENOUS | Status: DC

## 2021-11-27 MED ORDER — VANCOMYCIN HCL 1250 MG/250ML IV SOLN
1250.0000 mg | INTRAVENOUS | Status: DC
  Administered 2021-11-27: 1250 mg via INTRAVENOUS
  Filled 2021-11-27: qty 250

## 2021-11-27 MED ORDER — SODIUM CHLORIDE 0.9 % IV SOLN
2.0000 g | Freq: Three times a day (TID) | INTRAVENOUS | Status: DC
  Administered 2021-11-27 – 2021-11-29 (×5): 2 g via INTRAVENOUS
  Filled 2021-11-27 (×5): qty 12.5

## 2021-11-27 MED ORDER — NOREPINEPHRINE 4 MG/250ML-% IV SOLN
0.0000 ug/min | INTRAVENOUS | Status: DC
  Administered 2021-11-27: 2 ug/min via INTRAVENOUS
  Filled 2021-11-27: qty 250

## 2021-11-27 MED ORDER — SODIUM CHLORIDE 0.9 % IV SOLN: INTRAVENOUS | Status: DC

## 2021-11-27 MED ORDER — SODIUM CHLORIDE 0.9 % IV BOLUS
500.0000 mL | Freq: Once | INTRAVENOUS | Status: AC
  Administered 2021-11-27: 500 mL via INTRAVENOUS

## 2021-11-27 MED ORDER — DESMOPRESSIN ACETATE SPRAY 0.01 % NA SOLN
10.0000 ug | Freq: Two times a day (BID) | NASAL | Status: DC
  Administered 2021-11-27 – 2021-12-31 (×70): 10 ug via NASAL
  Filled 2021-11-27 (×3): qty 5

## 2021-11-27 NOTE — Progress Notes (Signed)
Percell Locus with social work spoke with this RT about changing pt's ventilator mode per SNF request. RT spoke with Dr. Lynetta Mare, CCM and verbal order was given to change pt's ventilator mode from [facility] to SIMV. Pt tolerating well at this time. Order changed.

## 2021-11-27 NOTE — Progress Notes (Signed)
Patient ID: [name], female   DOB: 1975-05-05, 46 y.o.   MRN: [id number] BP (!) 81/57   Pulse 69   Temp (!) 94.3 F (34.6 C) (Axillary) Comment: Rachel notified, bearhuggar applied  Resp 18   Ht '5\' 2"'$  (1.575 m)   Wt 79 kg   SpO2 100%   BMI 31.85 kg/m  Had hypotension, now being treated for sepsis. No neurological  change.

## 2021-11-27 NOTE — Progress Notes (Signed)
Patient had hypotension.  MD Agarwala notified.  Md ordered 500cc bolus of NS.  Bolus was administered.  Will continue to assess and act accordingly.

## 2021-11-27 NOTE — TOC Progression Note (Signed)
Transition of Care [facility]) - Progression Note    Patient Details  Name: [name] MRN: [id number] Date of Birth: 07-08-1975  Transition of Care [facility]) CM/SW [place], LCSW Phone Number: 11/27/2021, 1:15 PM  Clinical Narrative:    [facility], bed will be opening soon and they requested to try patient on SIMV or assist control. CSW appreciates RT and MS assistance in that. Will update [facility] on how patient does on it.   Expected Discharge Plan: Skilled Nursing Facility Barriers to Discharge: Ship broker, SNF Pending bed offer  Expected Discharge Plan and Services Expected Discharge Plan: [place] In-house Referral: Clinical Social Work, Hospice / Palliative Care Discharge Planning Services: CM Consult Post Acute Care Choice: [place] Living arrangements for the past 2 months: Single Family Home                                       Social Determinants of Health (SDOH) Interventions    Readmission Risk Interventions     No data to display

## 2021-11-27 NOTE — Progress Notes (Signed)
NAME:  [name], MRN:  [id number], DOB:  1975/07/14, LOS: 4 ADMISSION DATE:  10/01/2021, CONSULTATION DATE: 09/30/2021 REFERRING MD: EDP, CHIEF COMPLAINT: Subarachnoid hemorrhage  History of Present Illness:  46 yo female former smoker was found unresponsive by her husband and EMS called.  She was making gurgling noises with her breathing.  She was found to have SVT and EMS performed DCCV.  She had GCS 4 in ER and intubated for airway protection.  Found to have large SAH and neurosurgery arranged for hospital admission.  PCCM consulted to assist with management in ICU.  Pertinent Medical History:  Hypertension, uterine fibroids Legally blind in left eye  Significant Hospital Events: Including procedures, antibiotic start and stop dates in addition to other pertinent events   9/07 Presented unresponsive, hypotensive; CT head with subarachnoid hemorrhage 9/11 DDAVP give for Central DI, repeat head CT with worsening infarcts and herniation 9/12 Attempted transfer for second opinion at [facility] and [facility].  No beds at [facility] and transfer was declined at [facility] 9/13 No change in mental status.  Transfer to [facility] declined 9/14 [facility] declined transfer again.  No change.  DDAVP x 1 9/18 palliative care consulted 9/23 Na increased >> restart DDAVP and increase free water 9/25 Plan for trach. Increased pressor requirement. Added midodrine  9/26 D/c D5W. Weaning NE as able. On I/O cath protocol  9/27 Worse renal fxn and new hyperkalemia  9/30 Renal function improving 10/5 Off levo 10/6 2023 plan for PEG tube 10/7 BPs elevated in AM, monitoring 10/17 PEG deferred in the setting of severe hypernatremia (173), DI. 10/18 No significant neurologic change. Na improved to 152, remains on DDAVP with FWF. Weaning NE as able, BP remains labile. Midodrine initiated. 10/19 No significant neurologic change. DDAVP discontinued. BP improved with addition of midodrine, weaning NE. Increased midodrine to '15mg'$   TID. 10/20 Cortrak clogged this morning, awaiting PEG placement. Na 125 (134), discontinued D5W and FWF. 10/22 fever spiked, POD1 from PEG  11/1 persistent anemia of chronic disease.  Patient's family agreed to transfusion today.   Interim History / Subjective:  Remains comatose. TOC exploring long-term care options.   Objective:  Blood pressure 116/81, pulse 83, temperature (!) 94 F (34.4 C), temperature source Oral, resp. rate 14, height '5\' 2"'$  (1.575 m), weight 79 kg, SpO2 100 %.    Vent Mode: PRVC FiO2 (%):  [40 %] 40 % Set Rate:  [18 bmp] 18 bmp Vt Set:  [400 mL] 400 mL PEEP:  [5 cmH20] 5 cmH20 Plateau Pressure:  [16 cmH20-22 cmH20] 21 cmH20   Intake/Output Summary (Last 24 hours) at 11/27/2021 0842 Last data filed at 11/27/2021 0809 Gross per 24 hour  Intake 3470.58 ml  Output 2355 ml  Net 1115.58 ml    Filed Weights   11/20/21 0500 11/23/21 0416 11/27/21 0500  Weight: 74.7 kg 78.5 kg 79 kg   Physical Examination:   Physical exam: General: Crtitically ill-appearing female with a large protruding tongue, s/p trach HEENT: Eyes closed, atraumatic Neuro: Eyes closed, does not open, absent cough, gag, corneal, pupillary and oculocephalic reflexes. Choreiform movement with stimulation.  Chest: Coarse breath sounds, no wheezes or rhonchi. No spontaneous respiration.  Heart: Regular rate and rhythm, no murmurs or gallops Abdomen: Soft, nontender, nondistended, bowel sounds present Skin: No rash  Ancillary tests personally reviewed:   HB: 8.7 Creatinine: 0.57  Assessment & Plan:   Subarachnoid hemorrhage with neurologic devastation, comatose, cerebral edema with brain compression and herniation syndrome Vent  dependent respiratory failure Dismal neurological prognosis without any evidence of recovery with extended supportive care. Continued ong oing discussions regarding goals of care S/p trach/peg, continue supportive measures as below including full vent support, VAP  prevention bundle Continues to fail attempted weaning - need to plan on long-term ventilation.   Hypernatremia due to central 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 Switch DDVAP to intranasal Continue FWF Serum potassium is back to normal  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 Minimizing phlebotomy as family reluctant to receive blood. Continue aranesp + iron   Hypokalemia, resolved Aspiration pneumonia VAP with sepsis AKI  Resolved.  Best Practice (right click and "Reselect all SmartList Selections" daily)   Diet/type: tubefeeds and NPO w/ meds via tube  DVT prophylaxis: prophylactic heparin  GI prophylaxis: PPI Lines: Central line - RUE PICC Foley:  N/A Code Status:  per primary  Kipp Brood, MD [facility] ICU Physician [place]  Pager: [phone] Or Epic Secure Chat After hours: [phone].  11/27/2021, 8:42 AM

## 2021-11-27 NOTE — Progress Notes (Signed)
RT placed pt in CPAP/PS 5/5. Pt did not tolerate well, due to no patient effort (apenic). RT placed pt back in full ventilatory support with previous settings.

## 2021-11-28 ENCOUNTER — Inpatient Hospital Stay (HOSPITAL_COMMUNITY): Payer: Medicare Other

## 2021-11-28 DIAGNOSIS — I609 Nontraumatic subarachnoid hemorrhage, unspecified: Secondary | ICD-10-CM | POA: Diagnosis not present

## 2021-11-28 LAB — CBC
HCT: 29.7 % — ABNORMAL LOW (ref 36.0–46.0)
Hemoglobin: 9.3 g/dL — ABNORMAL LOW (ref 12.0–15.0)
MCH: 31.1 pg (ref 26.0–34.0)
MCHC: 31.3 g/dL (ref 30.0–36.0)
MCV: 99.3 fL (ref 80.0–100.0)
Platelets: 177 10*3/uL (ref 150–400)
RBC: 2.99 MIL/uL — ABNORMAL LOW (ref 3.87–5.11)
RDW: 21.4 % — ABNORMAL HIGH (ref 11.5–15.5)
WBC: 9.3 10*3/uL (ref 4.0–10.5)
nRBC: 2 % — ABNORMAL HIGH (ref 0.0–0.2)

## 2021-11-28 LAB — GLUCOSE, CAPILLARY
Glucose-Capillary: 108 mg/dL — ABNORMAL HIGH (ref 70–99)
Glucose-Capillary: 94 mg/dL (ref 70–99)
Glucose-Capillary: 92 mg/dL (ref 70–99)
Glucose-Capillary: 113 mg/dL — ABNORMAL HIGH (ref 70–99)
Glucose-Capillary: 129 mg/dL — ABNORMAL HIGH (ref 70–99)
Glucose-Capillary: 127 mg/dL — ABNORMAL HIGH (ref 70–99)

## 2021-11-28 LAB — HAPTOGLOBIN: Haptoglobin: 52 mg/dL (ref 42–296)

## 2021-11-28 LAB — BASIC METABOLIC PANEL
Anion gap: 6 (ref 5–15)
BUN: 20 mg/dL (ref 6–20)
CO2: 24 mmol/L (ref 22–32)
Calcium: 7.9 mg/dL — ABNORMAL LOW (ref 8.9–10.3)
Chloride: 109 mmol/L (ref 98–111)
Creatinine, Ser: 0.54 mg/dL (ref 0.44–1.00)
GFR, Estimated: >60 mL/min (ref >60)
Glucose, Bld: 96 mg/dL (ref 70–99)
Potassium: 5.2 mmol/L — ABNORMAL HIGH (ref 3.5–5.1)
Sodium: 139 mmol/L (ref 135–145)

## 2021-11-28 MED ORDER — HYDROCORTISONE SOD SUC (PF) 100 MG IJ SOLR
100.0000 mg | Freq: Once | INTRAMUSCULAR | Status: AC
  Administered 2021-11-28: 100 mg via INTRAVENOUS
  Filled 2021-11-28: qty 2

## 2021-11-28 MED ORDER — FREE WATER
100.0000 mL | Status: DC
  Administered 2021-11-28 – 2021-12-06 (×49): 100 mL

## 2021-11-28 MED ORDER — ALBUMIN HUMAN 5 % IV SOLN
25.0000 g | Freq: Once | INTRAVENOUS | Status: AC
  Administered 2021-11-28: 25 g via INTRAVENOUS
  Filled 2021-11-28: qty 500

## 2021-11-28 MED ORDER — HYDROCORTISONE 20 MG PO TABS
20.0000 mg | ORAL_TABLET | Freq: Two times a day (BID) | ORAL | Status: DC
  Administered 2021-11-28 – 2021-12-18 (×40): 20 mg
  Filled 2021-11-28 (×42): qty 1

## 2021-11-28 MED ORDER — POLYETHYLENE GLYCOL 3350 17 G PO PACK
17.0000 g | PACK | Freq: Every day | ORAL | Status: DC | PRN
  Administered 2021-12-08 – 2021-12-12 (×4): 17 g
  Filled 2021-11-28 (×5): qty 1

## 2021-11-28 MED ORDER — SENNOSIDES-DOCUSATE SODIUM 8.6-50 MG PO TABS
1.0000 | ORAL_TABLET | Freq: Every evening | ORAL | Status: DC | PRN
  Administered 2021-12-10 – 2021-12-15 (×2): 1
  Filled 2021-11-28: qty 1

## 2021-11-28 NOTE — Progress Notes (Signed)
NAME:  [name], MRN:  [id number], DOB:  February 21, 1975, LOS: 17 ADMISSION DATE:  10/11/2021, CONSULTATION DATE: 10/20/2021 REFERRING MD: EDP, CHIEF COMPLAINT: Subarachnoid hemorrhage  History of Present Illness:  46 yo female former smoker was found unresponsive by her husband and EMS called.  She was making gurgling noises with her breathing.  She was found to have SVT and EMS performed DCCV.  She had GCS 4 in ER and intubated for airway protection.  Found to have large SAH and neurosurgery arranged for hospital admission.  PCCM consulted to assist with management in ICU.  Pertinent Medical History:  Hypertension, uterine fibroids Legally blind in left eye  Significant Hospital Events: Including procedures, antibiotic start and stop dates in addition to other pertinent events   9/07 Presented unresponsive, hypotensive; CT head with subarachnoid hemorrhage 9/11 DDAVP give for Central DI, repeat head CT with worsening infarcts and herniation 9/12 Attempted transfer for second opinion at [facility] and [facility].  No beds at [facility] and transfer was declined at [facility] 9/13 No change in mental status.  Transfer to [facility] declined 9/14 [facility] declined transfer again.  No change.  DDAVP x 1 9/18 palliative care consulted 9/23 Na increased >> restart DDAVP and increase free water 9/25 Plan for trach. Increased pressor requirement. Added midodrine  9/26 D/c D5W. Weaning NE as able. On I/O cath protocol  9/27 Worse renal fxn and new hyperkalemia  9/30 Renal function improving 10/5 Off levo 10/6 2023 plan for PEG tube 10/7 BPs elevated in AM, monitoring 10/17 PEG deferred in the setting of severe hypernatremia (173), DI. 10/18 No significant neurologic change. Na improved to 152, remains on DDAVP with FWF. Weaning NE as able, BP remains labile. Midodrine initiated. 10/19 No significant neurologic change. DDAVP discontinued. BP improved with addition of midodrine, weaning NE. Increased midodrine to '15mg'$   TID. 10/20 Cortrak clogged this morning, awaiting PEG placement. Na 125 (134), discontinued D5W and FWF. 10/22 fever spiked, POD1 from PEG  11/1 persistent anemia of chronic disease.  Patient's family agreed to transfusion today.  11/2 hypotensive, hypothermic  Interim History / Subjective:  Started on vanc/cefepime and NS infusion when developed hypothermia, hypotension.   Objective:  Blood pressure (!) 183/109, pulse (!) 102, temperature 97.7 F (36.5 C), temperature source Axillary, resp. rate 17, height '5\' 2"'$  (1.575 m), weight 79.4 kg, SpO2 98 %.    Vent Mode: SIMV;PSV;PRVC FiO2 (%):  [40 %] 40 % Set Rate:  [18 bmp] 18 bmp Vt Set:  [400 mL] 400 mL PEEP:  [5 cmH20] 5 cmH20 Pressure Support:  [21 cmH20] 21 cmH20 Plateau Pressure:  [18 cmH20-23 cmH20] 20 cmH20   Intake/Output Summary (Last 24 hours) at 11/28/2021 1234 Last data filed at 11/28/2021 1100 Gross per 24 hour  Intake 4001.66 ml  Output 2150 ml  Net 1851.66 ml   Filed Weights   11/23/21 0416 11/27/21 0500 11/28/21 0500  Weight: 78.5 kg 79 kg 79.4 kg   Physical Examination:   Physical exam: General appearance: 46 y.o., female, chronically critically ill Eyes: pupils fixed dilated HENT: NCAT; protruding tongue Lungs: rhonchorous, equal chest rise CV: tachy RR, no murmur  Abdomen: Soft, non-tender; non-distended, BS present  Extremities: 1+ dependent edema, warm Neuro: writhes with noxious stim BLE   Ancillary tests personally reviewed:   BMP stable CBC stable  CXR with bilateral lower lobe opacities - atelectasis vs consolidation, similar to prior  Assessment & Plan:   Subarachnoid hemorrhage with neurologic devastation, comatose, cerebral  edema with brain compression and herniation syndrome Vent dependent respiratory failure Dismal neurological prognosis without any evidence of recovery with extended supportive care. Continued ong oing discussions regarding goals of care S/p trach/peg, continue  supportive measures as below including full vent support, VAP prevention bundle Continues to fail attempted weaning - need to plan long term mech ventilation  Hypernatremia due to central 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 Continue DDAVP Decrease FWF  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 Minimizing phlebotomy as family reluctant to receive blood. Continue aranesp + iron  Relative adrenal insufficiency Increase to hydrocortisone 20 bid, one time dose of 100 mg IV   Hypotension Concern for infection given hypothermia  DC ABX at 48h if resp culture unrevealing, remains stable Albumin 25g 5%  Hypokalemia, resolved Aspiration pneumonia VAP with sepsis AKI  Resolved  Best Practice (right click and "Reselect all SmartList Selections" daily)   Diet/type: tubefeeds and NPO w/ meds via tube  DVT prophylaxis: prophylactic heparin  GI prophylaxis: PPI Lines: Central line - RUE PICC - discuss removing/replacing tomorrow with family Foley:  N/A Code Status:  per primary  [place]   11/28/2021, 12:34 PM

## 2021-11-28 NOTE — Progress Notes (Signed)
RT placed pt in CPAP/PS 5/5, pt did not tolerate well. Ventilator read no patient effort (apenic). RT placed pt back in full ventilatory support with previous settings.

## 2021-11-28 NOTE — Progress Notes (Signed)
Per chart, pt is due for trach change. RT spoke with Dr. Verlee Monte, per CCM RT is to hold off on trach change for now until further [place] are discussed.

## 2021-11-28 NOTE — Progress Notes (Signed)
Patient ID: [name], female   DOB: 08-10-1975, 46 y.o.   MRN: [id number] BP (!) 148/97   Pulse 98   Temp 97.9 F (36.6 C) (Axillary)   Resp 18   Ht '5\' 2"'$  (1.575 m)   Wt 79.4 kg   SpO2 100%   BMI 32.02 kg/m  Comatose. No corneal reflexes. No oculocephalic reflex No cough, no gag reflex.  Grim prognosis.  CCM treating for possible sepsis.

## 2021-11-29 DIAGNOSIS — I609 Nontraumatic subarachnoid hemorrhage, unspecified: Secondary | ICD-10-CM | POA: Diagnosis not present

## 2021-11-29 LAB — BASIC METABOLIC PANEL
Anion gap: 5 (ref 5–15)
BUN: 17 mg/dL (ref 6–20)
CO2: 21 mmol/L — ABNORMAL LOW (ref 22–32)
Calcium: 6.6 mg/dL — ABNORMAL LOW (ref 8.9–10.3)
Chloride: 116 mmol/L — ABNORMAL HIGH (ref 98–111)
Creatinine, Ser: 0.41 mg/dL — ABNORMAL LOW (ref 0.44–1.00)
GFR, Estimated: >60 mL/min (ref >60)
Glucose, Bld: 86 mg/dL (ref 70–99)
Potassium: 3.8 mmol/L (ref 3.5–5.1)
Sodium: 142 mmol/L (ref 135–145)

## 2021-11-29 LAB — GLUCOSE, CAPILLARY
Glucose-Capillary: 106 mg/dL — ABNORMAL HIGH (ref 70–99)
Glucose-Capillary: 125 mg/dL — ABNORMAL HIGH (ref 70–99)
Glucose-Capillary: 83 mg/dL (ref 70–99)
Glucose-Capillary: 85 mg/dL (ref 70–99)
Glucose-Capillary: 89 mg/dL (ref 70–99)

## 2021-11-29 MED ORDER — SODIUM CHLORIDE 0.9% FLUSH: 10.0000 mL | INTRAVENOUS | Status: DC | PRN

## 2021-11-29 MED ORDER — SODIUM CHLORIDE 0.9% FLUSH
10.0000 mL | Freq: Two times a day (BID) | INTRAVENOUS | Status: DC
  Administered 2021-11-30 – 2021-12-02 (×4): 10 mL

## 2021-11-29 NOTE — Progress Notes (Signed)
N.O. to place a midline tonight, following a note from the same (MD) Dr. Verlee Monte to wait until this is communicated w/ family. This RN reached out to [facility] for clairty on this matter. Family at bedside, husband expressed that he was not aware of any plan to change or replace pt's line. Still waiting on a response from [facility] MD.

## 2021-11-29 NOTE — Progress Notes (Signed)
NAME:  [name], MRN:  [id number], DOB:  1975/07/06, LOS: 17 ADMISSION DATE:  10/25/2021, CONSULTATION DATE: 09/28/2021 REFERRING MD: EDP, CHIEF COMPLAINT: Subarachnoid hemorrhage  History of Present Illness:  46 yo female former smoker was found unresponsive by her husband and EMS called.  She was making gurgling noises with her breathing.  She was found to have SVT and EMS performed DCCV.  She had GCS 4 in ER and intubated for airway protection.  Found to have large SAH and neurosurgery arranged for hospital admission.  PCCM consulted to assist with management in ICU.  Pertinent Medical History:  Hypertension, uterine fibroids Legally blind in left eye  Significant Hospital Events: Including procedures, antibiotic start and stop dates in addition to other pertinent events   9/07 Presented unresponsive, hypotensive; CT head with subarachnoid hemorrhage 9/11 DDAVP give for Central DI, repeat head CT with worsening infarcts and herniation 9/12 Attempted transfer for second opinion at [facility] and [facility].  No beds at [facility] and transfer was declined at [facility] 9/13 No change in mental status.  Transfer to [facility] declined 9/14 [facility] declined transfer again.  No change.  DDAVP x 1 9/18 palliative care consulted 9/23 Na increased >> restart DDAVP and increase free water 9/25 Plan for trach. Increased pressor requirement. Added midodrine  9/26 D/c D5W. Weaning NE as able. On I/O cath protocol  9/27 Worse renal fxn and new hyperkalemia  9/30 Renal function improving 10/5 Off levo 10/6 2023 plan for PEG tube 10/7 BPs elevated in AM, monitoring 10/17 PEG deferred in the setting of severe hypernatremia (173), DI. 10/18 No significant neurologic change. Na improved to 152, remains on DDAVP with FWF. Weaning NE as able, BP remains labile. Midodrine initiated. 10/19 No significant neurologic change. DDAVP discontinued. BP improved with addition of midodrine, weaning NE. Increased midodrine to '15mg'$   TID. 10/20 Cortrak clogged this morning, awaiting PEG placement. Na 125 (134), discontinued D5W and FWF. 10/22 fever spiked, POD1 from PEG  11/1 persistent anemia of chronic disease.  Patient's family agreed to transfusion today.  11/2 hypotensive, hypothermic started on vanc/cefepime, low dose levo 11/3 steroid dose increased, 5% 25g albumin given, off pressor  Interim History / Subjective:  No acute issues  Objective:  Blood pressure 118/76, pulse 90, temperature 98.1 F (36.7 C), temperature source Oral, resp. rate 18, height '5\' 2"'$  (1.575 m), weight 80 kg, SpO2 99 %.    Vent Mode: SIMV;PRVC;PSV FiO2 (%):  [40 %] 40 % Set Rate:  [18 bmp] 18 bmp Vt Set:  [400 mL] 400 mL PEEP:  [5 cmH20] 5 cmH20 Pressure Support:  [21 cmH20] 21 cmH20 Plateau Pressure:  [19 cmH20-21 cmH20] 21 cmH20   Intake/Output Summary (Last 24 hours) at 11/29/2021 0756 Last data filed at 11/29/2021 0700 Gross per 24 hour  Intake 4913.73 ml  Output 1280 ml  Net 3633.73 ml   Filed Weights   11/27/21 0500 11/28/21 0500 11/29/21 0300  Weight: 79 kg 79.4 kg 80 kg   Physical Examination:   Physical exam: General appearance: 46 y.o., female, chronically critically ill Eyes: pupils fixed dilated HENT: NCAT; protruding tongue Lungs: quite rhonchorous, equal chest rise, not triggering CV: RRR, no murmur  Abdomen: Soft, non-tender; non-distended, BS present  Extremities: 1+ dependent edema, warm Neuro: writhes with noxious stim BLE   Ancillary tests personally reviewed:   No new labs  No new imaging  Assessment & Plan:   Subarachnoid hemorrhage with neurologic devastation, comatose, cerebral edema with brain  compression and herniation syndrome Vent dependent respiratory failure Dismal neurological prognosis without any evidence of recovery with extended supportive care. 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 S/p course of aranesp, iron  Relative adrenal insufficiency Continue hydrocortisone 20 bid  Hypotension DC ABX Discuss removal of PICC, placement of midline for central line holiday then new PICC with family  Hypokalemia, resolved Aspiration pneumonia VAP with sepsis AKI  Resolved  Best Practice (right click and "Reselect all SmartList Selections" daily)   Diet/type: tubefeeds and NPO w/ meds via tube  DVT prophylaxis: prophylactic heparin  GI prophylaxis: PPI Lines: Central line - RUE PICC - discussed removal/replacement with pt's husband who wishes to talk about it with son first Foley:  N/A Code Status:  per primary  Fredirick Maudlin Pulmonary/Critical Care   11/29/2021, 7:56 AM

## 2021-11-29 NOTE — Progress Notes (Signed)
Daughter and husband have confirmed they have gotten all of their questions answered pertaining the PICC removal and getting a midline. They have agreed to have this done. IV team informed and will be added to the list in the morning.

## 2021-11-29 NOTE — Progress Notes (Signed)
Spoke w/ Dr. Tamala Julian, MD agreed to hold on placing midline tonight d/t family not having a conversation w/ doctors about this intervention. Dr. Tamala Julian request that an IV team consult be placed to have this done in the morning when the rounding doctors could speak w/ the family. This to be communicated to the on-coming RN.

## 2021-11-29 NOTE — Progress Notes (Signed)
Patient ID: [name], female   DOB: 27-Apr-1975, 46 y.o.   MRN: [id number]  Reviewed chart  Goals are clearly set and plan of care is directed by attending.    PMT will sign off at this time.  Wadie Lessen NP  Palliative Medicine Team [phone]

## 2021-11-29 NOTE — Progress Notes (Signed)

## 2021-11-30 DIAGNOSIS — I609 Nontraumatic subarachnoid hemorrhage, unspecified: Secondary | ICD-10-CM | POA: Diagnosis not present

## 2021-11-30 LAB — CBC
HCT: 29.6 % — ABNORMAL LOW (ref 36.0–46.0)
Hemoglobin: 8.9 g/dL — ABNORMAL LOW (ref 12.0–15.0)
MCH: 30.4 pg (ref 26.0–34.0)
MCHC: 30.1 g/dL (ref 30.0–36.0)
MCV: 101 fL — ABNORMAL HIGH (ref 80.0–100.0)
Platelets: 186 10*3/uL (ref 150–400)
RBC: 2.93 MIL/uL — ABNORMAL LOW (ref 3.87–5.11)
RDW: 21.8 % — ABNORMAL HIGH (ref 11.5–15.5)
WBC: 8 10*3/uL (ref 4.0–10.5)
nRBC: 2 % — ABNORMAL HIGH (ref 0.0–0.2)

## 2021-11-30 LAB — GLUCOSE, CAPILLARY
Glucose-Capillary: 87 mg/dL (ref 70–99)
Glucose-Capillary: 101 mg/dL — ABNORMAL HIGH (ref 70–99)
Glucose-Capillary: 113 mg/dL — ABNORMAL HIGH (ref 70–99)
Glucose-Capillary: 110 mg/dL — ABNORMAL HIGH (ref 70–99)
Glucose-Capillary: 95 mg/dL (ref 70–99)
Glucose-Capillary: 78 mg/dL (ref 70–99)

## 2021-11-30 LAB — BASIC METABOLIC PANEL
Anion gap: 4 — ABNORMAL LOW (ref 5–15)
BUN: 18 mg/dL (ref 6–20)
CO2: 24 mmol/L (ref 22–32)
Calcium: 8.2 mg/dL — ABNORMAL LOW (ref 8.9–10.3)
Chloride: 116 mmol/L — ABNORMAL HIGH (ref 98–111)
Creatinine, Ser: 0.52 mg/dL (ref 0.44–1.00)
GFR, Estimated: >60 mL/min (ref >60)
Glucose, Bld: 79 mg/dL (ref 70–99)
Potassium: 4.6 mmol/L (ref 3.5–5.1)
Sodium: 144 mmol/L (ref 135–145)

## 2021-11-30 NOTE — Progress Notes (Signed)
NAME:  [name], MRN:  [id number], DOB:  11-Mar-1975, LOS: 37 ADMISSION DATE:  10/15/2021, CONSULTATION DATE: 10/17/2021 REFERRING MD: EDP, CHIEF COMPLAINT: Subarachnoid hemorrhage  History of Present Illness:  46 yo female former smoker was found unresponsive by her husband and EMS called.  She was making gurgling noises with her breathing.  She was found to have SVT and EMS performed DCCV.  She had GCS 4 in ER and intubated for airway protection.  Found to have large SAH and neurosurgery arranged for hospital admission.  PCCM consulted to assist with management in ICU.  Pertinent Medical History:  Hypertension, uterine fibroids Legally blind in left eye  Significant Hospital Events: Including procedures, antibiotic start and stop dates in addition to other pertinent events   9/07 Presented unresponsive, hypotensive; CT head with subarachnoid hemorrhage 9/11 DDAVP give for Central DI, repeat head CT with worsening infarcts and herniation 9/12 Attempted transfer for second opinion at [facility] and [facility].  No beds at [facility] and transfer was declined at [facility] 9/13 No change in mental status.  Transfer to [facility] declined 9/14 [facility] declined transfer again.  No change.  DDAVP x 1 9/18 palliative care consulted 9/23 Na increased >> restart DDAVP and increase free water 9/25 Plan for trach. Increased pressor requirement. Added midodrine  9/26 D/c D5W. Weaning NE as able. On I/O cath protocol  9/27 Worse renal fxn and new hyperkalemia  9/30 Renal function improving 10/5 Off levo 10/6 2023 plan for PEG tube 10/7 BPs elevated in AM, monitoring 10/17 PEG deferred in the setting of severe hypernatremia (173), DI. 10/18 No significant neurologic change. Na improved to 152, remains on DDAVP with FWF. Weaning NE as able, BP remains labile. Midodrine initiated. 10/19 No significant neurologic change. DDAVP discontinued. BP improved with addition of midodrine, weaning NE. Increased midodrine to '15mg'$   TID. 10/20 Cortrak clogged this morning, awaiting PEG placement. Na 125 (134), discontinued D5W and FWF. 10/22 fever spiked, POD1 from PEG  11/1 persistent anemia of chronic disease.  Patient's family agreed to transfusion today.  11/2 hypotensive, hypothermic started on vanc/cefepime, low dose levo 11/3 steroid dose increased, 5% 25g albumin given, off pressor 11/4 RUE picc removed, mid-line placed  Interim History / Subjective:  No acute issues. PICC line removed and mid-line placed  Objective:  Blood pressure (!) 154/94, pulse 74, temperature 98.6 F (37 C), temperature source Axillary, resp. rate 18, height '5\' 2"'$  (1.575 m), weight 80 kg, SpO2 100 %.    Vent Mode: AC FiO2 (%):  [40 %] 40 % Set Rate:  [18 bmp] 18 bmp Vt Set:  [400 mL] 400 mL PEEP:  [5 cmH20] 5 cmH20 Plateau Pressure:  [17 cmH20-18 cmH20] 18 cmH20   Intake/Output Summary (Last 24 hours) at 11/30/2021 0944 Last data filed at 11/30/2021 0800 Gross per 24 hour  Intake 3155 ml  Output 1750 ml  Net 1405 ml   Filed Weights   11/27/21 0500 11/28/21 0500 11/29/21 0300  Weight: 79 kg 79.4 kg 80 kg   Physical Examination:   Physical exam: General appearance: 46 y.o., female, chronically critically ill Eyes: pupils fixed dilated HENT: NCAT; protruding tongue Lungs: rhonchorous, equal chest rise, not triggering CV: RRR, no murmur  Abdomen: Soft, non-tender; non-distended, BS present  Extremities: 1+ dependent edema, warm Neuro: writhes with noxious stim ble   Ancillary tests personally reviewed:   Na 144  No new imaging  Assessment & Plan:   Subarachnoid hemorrhage with neurologic devastation, comatose,  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 S/p course of aranesp, iron  Relative adrenal insufficiency Continue hydrocortisone 20 bid  Hypotension DC ABX RUE PICC removed 11/4 and mid-line placed for kind of a line holiday  Hypokalemia, resolved Aspiration pneumonia VAP with sepsis AKI  Resolved  Best Practice (right click and "Reselect all SmartList Selections" daily)   Diet/type: tubefeeds and NPO w/ meds via tube  DVT prophylaxis: prophylactic heparin  GI prophylaxis: PPI Lines: N/A  Foley:  N/A Code Status:  full  Fredirick Maudlin Pulmonary/Critical Care   11/30/2021, 9:44 AM

## 2021-12-01 DIAGNOSIS — I609 Nontraumatic subarachnoid hemorrhage, unspecified: Secondary | ICD-10-CM | POA: Diagnosis not present

## 2021-12-01 LAB — GLUCOSE, CAPILLARY
Glucose-Capillary: 114 mg/dL — ABNORMAL HIGH (ref 70–99)
Glucose-Capillary: 112 mg/dL — ABNORMAL HIGH (ref 70–99)
Glucose-Capillary: 85 mg/dL (ref 70–99)
Glucose-Capillary: 102 mg/dL — ABNORMAL HIGH (ref 70–99)
Glucose-Capillary: 125 mg/dL — ABNORMAL HIGH (ref 70–99)
Glucose-Capillary: 113 mg/dL — ABNORMAL HIGH (ref 70–99)
Glucose-Capillary: 71 mg/dL (ref 70–99)

## 2021-12-01 NOTE — Progress Notes (Addendum)
NAME:  [name], MRN:  [id number], DOB:  Jun 17, 1975, LOS: 62 ADMISSION DATE:  10/08/2021, CONSULTATION DATE: 09/27/2021 REFERRING MD: EDP, CHIEF COMPLAINT: Subarachnoid hemorrhage  History of Present Illness:  46 yo female former smoker was found unresponsive by her husband and EMS called.  She was making gurgling noises with her breathing.  She was found to have SVT and EMS performed DCCV.  She had GCS 4 in ER and intubated for airway protection.  Found to have large SAH and neurosurgery arranged for hospital admission.  PCCM consulted to assist with management in ICU.  Pertinent Medical History:  Hypertension, uterine fibroids Legally blind in left eye  Significant Hospital Events: Including procedures, antibiotic start and stop dates in addition to other pertinent events   9/07 Presented unresponsive, hypotensive; CT head with subarachnoid hemorrhage 9/11 DDAVP give for Central DI, repeat head CT with worsening infarcts and herniation 9/12 Attempted transfer for second opinion at [facility] and [facility].  No beds at [facility] and transfer was declined at [facility] 9/13 No change in mental status.  Transfer to [facility] declined 9/14 [facility] declined transfer again.  No change.  DDAVP x 1 9/18 Palliative care consulted 9/23 Na increased >> restart DDAVP and increase free water 9/25 Plan for trach. Increased pressor requirement. Added midodrine  9/26 D/c D5W. Weaning NE as able. On I/O cath protocol  9/27 Worse renal fxn and new hyperkalemia  9/30 Renal function improving 10/5 Off levo 10/6 Plan for PEG tube 10/7 BPs elevated in AM, monitoring 10/17 PEG deferred in the setting of severe hypernatremia (173), DI. 10/18 No significant neurologic change. Na improved to 152, remains on DDAVP with FWF. Weaning NE as able, BP remains labile. Midodrine initiated. 10/19 No significant neurologic change. DDAVP discontinued. BP improved with addition of midodrine, weaning NE. Increased midodrine to '15mg'$   TID. 10/20 Cortrak clogged this morning, awaiting PEG placement. Na 125 (134), discontinued D5W and FWF. 10/22 Fever spiked, POD1 from PEG  11/1 Persistent anemia of chronic disease.  Patient's family agreed to transfusion today.  11/2 Hypotensive, hypothermic started on vanc/cefepime, low dose levo 11/3 Steroid dose increased, 5% 25g albumin given, off pressor 11/4 RUE picc removed, mid-line placed  Interim History / Subjective:  No changes overnight On full support  Afebrile   Objective:  Blood pressure (!) 148/99, pulse 69, temperature (!) 97.5 F (36.4 C), temperature source Axillary, resp. rate 18, height '5\' 2"'$  (1.575 m), weight 81.4 kg, SpO2 99 %.    Vent Mode: AC FiO2 (%):  [40 %] 40 % Set Rate:  [18 bmp] 18 bmp Vt Set:  [400 mL] 400 mL PEEP:  [5 cmH20] 5 cmH20 Plateau Pressure:  [18 cmH20-20 cmH20] 19 cmH20   Intake/Output Summary (Last 24 hours) at 12/01/2021 0850 Last data filed at 12/01/2021 0800 Gross per 24 hour  Intake 2989.15 ml  Output 2200 ml  Net 789.15 ml   Filed Weights   11/28/21 0500 11/29/21 0300 12/01/21 0500  Weight: 79.4 kg 80 kg 81.4 kg   Physical Exam: General: adult female lying in bed in NAD  HEENT: MM pink/moist, tongue protruding from mouth, pupils 4-14m not reactive Neuro: unresponsive, no respiratory effort on vent in PSV CV: s1s2 RRR, no m/r/g PULM: non-labored at rest, lungs bilaterally clear with good air entry GI: soft, bsx4 active  Extremities: warm/dry, generalized 1-2+ edema  Skin: no rashes or lesions   Resolved Hospital Issues  Hypokalemia, resolved Aspiration pneumonia VAP with sepsis AKI  Assessment & Plan:   Subarachnoid hemorrhage with neurologic devastation, comatose, 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 S/p course of aranesp, iron -trend CBC intermittently  -note has refused blood products in past, but husband ultimately allowed   Relative Adrenal Insufficiency -hydrocortisone 20 mg BID   Hypotension -monitor off abx  -PICC removed 11/4, midline in place    Best Practice (right click and "Reselect all SmartList Selections" daily)  Diet/type: tubefeeds and NPO w/ meds via tube  DVT prophylaxis: prophylactic heparin  GI prophylaxis: PPI Lines: N/A  Foley:  N/A Code Status: Full Code  Critical Care Time: 57 minutes   Noe Gens, MSN, APRN, NP-C, AGACNP-BC [place] [facility] 12/01/2021, 8:50 AM   Please see [email or web address] for pager details.   From 7A-7P if no response, please call [phone] After hours, please call ELink [phone]

## 2021-12-01 NOTE — TOC Progression Note (Signed)
Transition of Care [facility]) - Progression Note    Patient Details  Name: INDRIA BISHARA MRN: [id number] Date of Birth: 1975-06-12  Transition of Care [facility]) CM/SW [place], LCSW Phone Number: 12/01/2021, 11:43 AM  Clinical Narrative:    CSW updated [facility]; awaiting response on bed availability.    Expected Discharge Plan: Skilled Nursing Facility Barriers to Discharge: Ship broker, SNF Pending bed offer  Expected Discharge Plan and Services Expected Discharge Plan: [place] In-house Referral: Clinical Social Work, Hospice / Palliative Care Discharge Planning Services: CM Consult Post Acute Care Choice: [place] Living arrangements for the past 2 months: Single Family Home                                       Social Determinants of Health (SDOH) Interventions    Readmission Risk Interventions     No data to display

## 2021-12-01 NOTE — TOC Progression Note (Signed)
Transition of Care [facility]) - Progression Note    Patient Details  Name: [name] MRN: [id number] Date of Birth: December 01, 1975  Transition of Care [facility]) CM/SW [place], LCSW Phone Number: 12/01/2021, 3:57 PM  Clinical Narrative:    CSW sent updated clinicals to [facility] as requested.    Expected Discharge Plan: Skilled Nursing Facility Barriers to Discharge: Ship broker, SNF Pending bed offer  Expected Discharge Plan and Services Expected Discharge Plan: [place] In-house Referral: Clinical Social Work, Hospice / Palliative Care Discharge Planning Services: CM Consult Post Acute Care Choice: [place] Living arrangements for the past 2 months: Single Family Home                                       Social Determinants of Health (SDOH) Interventions    Readmission Risk Interventions     No data to display

## 2021-12-01 NOTE — Progress Notes (Signed)
Patient ID: [name], female   DOB: January 16, 1976, 46 y.o.   MRN: [id number] BP 129/87   Pulse 62   Temp (!) 96.4 F (35.8 C) (Axillary) Comment: applied bare hugger  Resp 18   Ht '5\' 2"'$  (1.575 m)   Wt 81.4 kg   SpO2 100%   BMI 32.82 kg/m  Comatose, no corneal reflexes No oculocephalic reflex No neurological changes

## 2021-12-02 DIAGNOSIS — I609 Nontraumatic subarachnoid hemorrhage, unspecified: Secondary | ICD-10-CM | POA: Diagnosis not present

## 2021-12-02 LAB — BASIC METABOLIC PANEL
Anion gap: 5 (ref 5–15)
BUN: 30 mg/dL — ABNORMAL HIGH (ref 6–20)
CO2: 24 mmol/L (ref 22–32)
Calcium: 7.9 mg/dL — ABNORMAL LOW (ref 8.9–10.3)
Chloride: 119 mmol/L — ABNORMAL HIGH (ref 98–111)
Creatinine, Ser: 0.87 mg/dL (ref 0.44–1.00)
GFR, Estimated: >60 mL/min (ref >60)
Glucose, Bld: 96 mg/dL (ref 70–99)
Potassium: 4.5 mmol/L (ref 3.5–5.1)
Sodium: 148 mmol/L — ABNORMAL HIGH (ref 135–145)

## 2021-12-02 LAB — CBC
HCT: 29 % — ABNORMAL LOW (ref 36.0–46.0)
Hemoglobin: 8.8 g/dL — ABNORMAL LOW (ref 12.0–15.0)
MCH: 30.8 pg (ref 26.0–34.0)
MCHC: 30.3 g/dL (ref 30.0–36.0)
MCV: 101.4 fL — ABNORMAL HIGH (ref 80.0–100.0)
Platelets: 197 10*3/uL (ref 150–400)
RBC: 2.86 MIL/uL — ABNORMAL LOW (ref 3.87–5.11)
RDW: 23 % — ABNORMAL HIGH (ref 11.5–15.5)
WBC: 8.8 10*3/uL (ref 4.0–10.5)
nRBC: 6.6 % — ABNORMAL HIGH (ref 0.0–0.2)

## 2021-12-02 LAB — GLUCOSE, CAPILLARY
Glucose-Capillary: 104 mg/dL — ABNORMAL HIGH (ref 70–99)
Glucose-Capillary: 76 mg/dL (ref 70–99)
Glucose-Capillary: 77 mg/dL (ref 70–99)
Glucose-Capillary: 77 mg/dL (ref 70–99)
Glucose-Capillary: 104 mg/dL — ABNORMAL HIGH (ref 70–99)
Glucose-Capillary: 91 mg/dL (ref 70–99)

## 2021-12-02 NOTE — Progress Notes (Signed)
NAME:  ISHANVI MCQUITTY, MRN:  [id number], DOB:  12/10/1975, LOS: 25 ADMISSION DATE:  10/18/2021, CONSULTATION DATE: 10/07/2021 REFERRING MD: EDP, CHIEF COMPLAINT: Subarachnoid hemorrhage  History of Present Illness:  46 yo female former smoker was found unresponsive by her husband and EMS called.  She was making gurgling noises with her breathing.  She was found to have SVT and EMS performed DCCV.  She had GCS 4 in ER and intubated for airway protection.  Found to have large SAH and neurosurgery arranged for hospital admission.  PCCM consulted to assist with management in ICU.  Pertinent Medical History:  Hypertension, uterine fibroids Legally blind in left eye  Significant Hospital Events: Including procedures, antibiotic start and stop dates in addition to other pertinent events   9/07 Presented unresponsive, hypotensive; CT head with subarachnoid hemorrhage 9/11 DDAVP give for Central DI, repeat head CT with worsening infarcts and herniation 9/12 Attempted transfer for second opinion at [facility] and [facility].  No beds at [facility] and transfer was declined at [facility] 9/13 No change in mental status.  Transfer to [facility] declined 9/14 [facility] declined transfer again.  No change.  DDAVP x 1 9/18 Palliative care consulted 9/23 Na increased >> restart DDAVP and increase free water 9/25 Plan for trach. Increased pressor requirement. Added midodrine  9/26 D/c D5W. Weaning NE as able. On I/O cath protocol  9/27 Worse renal fxn and new hyperkalemia  9/30 Renal function improving 10/5 Off levo 10/6 Plan for PEG tube 10/7 BPs elevated in AM, monitoring 10/17 PEG deferred in the setting of severe hypernatremia (173), DI. 10/18 No significant neurologic change. Na improved to 152, remains on DDAVP with FWF. Weaning NE as able, BP remains labile. Midodrine initiated. 10/19 No significant neurologic change. DDAVP discontinued. BP improved with addition of midodrine, weaning NE. Increased midodrine to '15mg'$   TID. 10/20 Cortrak clogged this morning, awaiting PEG placement. Na 125 (134), discontinued D5W and FWF. 10/22 Fever spiked, POD1 from PEG  11/1 Persistent anemia of chronic disease.  Patient's family agreed to transfusion today.  11/2 Hypotensive, hypothermic started on vanc/cefepime, low dose levo 11/3 Steroid dose increased, 5% 25g albumin given, off pressor 11/4 RUE picc removed, mid-line placed  Interim History / Subjective:  No changes in exam  Tmax 100.4  Glucose range 71-104  I/O 1.7L UOP, +1.2L in last 24 hours  Objective:  Blood pressure (!) 169/106, pulse 95, temperature (!) 100.4 F (38 C), temperature source Axillary, resp. rate (!) 21, height '5\' 2"'$  (1.575 m), weight 81.4 kg, SpO2 100 %.    Vent Mode: AC FiO2 (%):  [40 %] 40 % Set Rate:  [18 bmp] 18 bmp Vt Set:  [400 mL] 400 mL PEEP:  [5 cmH20] 5 cmH20 Plateau Pressure:  [18 cmH20-19 cmH20] 19 cmH20   Intake/Output Summary (Last 24 hours) at 12/02/2021 0926 Last data filed at 12/02/2021 0800 Gross per 24 hour  Intake 1300 ml  Output 1350 ml  Net -50 ml   Filed Weights   11/28/21 0500 11/29/21 0300 12/01/21 0500  Weight: 79.4 kg 80 kg 81.4 kg   Physical Exam: General: adult female lying in bed, critically ill appearing  HEENT: MM pink/moist, protruding edematous tongue, pupils 54m non-reactive   Neuro: comatose, no cough/gag, no effort on vent, no response to verbal stimuli CV: s1s2 RRR, no m/r/g PULM: non-labored at rest, lungs bilaterally coarse with rhonchi GI: soft, bsx4 active  Extremities: warm/dry, generalized 1+ edema  Skin: no rashes or lesions  Resolved Hospital Issues  Hypokalemia, resolved Aspiration pneumonia VAP with sepsis AKI  Assessment & Plan:   Subarachnoid Hemorrhage with neurologic devastation, comatose, cerebral edema with brain compression and herniation syndrome Vent dependent respiratory failure Dismal neurological prognosis without any evidence of recovery with extended  supportive care.  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 S/p course of aranesp, iron -follow CBC  -note refusal of blood products in past, but significant other ultimately allowed   Relative Adrenal Insufficiency -continue hydrocortisone '20mg'$  BID  Hypotension -PICC removed 11/4, midline in place -monitor BP trend   Best Practice (right click and "Reselect all SmartList Selections" daily)  Diet/type: tubefeeds and NPO w/ meds via tube  DVT prophylaxis: prophylactic heparin  GI prophylaxis: PPI Lines: N/A  Foley:  N/A Code Status: Full Code  Critical Care Time: 31 minutes   Noe Gens, MSN, APRN, NP-C, AGACNP-BC [place] [facility] 12/02/2021, 9:26 AM   Please see [email or web address] for pager details.   From 7A-7P if no response, please call [phone] After hours, please call ELink [phone]

## 2021-12-02 NOTE — Progress Notes (Signed)
Patient ID: [name], female   DOB: Jan 30, 1975, 46 y.o.   MRN: [id number] BP 122/79   Pulse 72   Temp 99.1 F (37.3 C) (Axillary)   Resp 18   Ht '5\' 2"'$  (1.575 m)   Wt 81.4 kg   SpO2 96%   BMI 32.82 kg/m  Comatose, no corneal reflex, no oculocephalic reflex, no cough, no gag No neurological change. [place] prognosis

## 2021-12-03 DIAGNOSIS — I609 Nontraumatic subarachnoid hemorrhage, unspecified: Secondary | ICD-10-CM | POA: Diagnosis not present

## 2021-12-03 LAB — CBC WITH DIFFERENTIAL/PLATELET
Abs Immature Granulocytes: 0.08 10*3/uL — ABNORMAL HIGH (ref 0.00–0.07)
Basophils Absolute: 0 10*3/uL (ref 0.0–0.1)
Basophils Relative: 0 %
Eosinophils Absolute: 0.1 10*3/uL (ref 0.0–0.5)
Eosinophils Relative: 1 %
HCT: 30.3 % — ABNORMAL LOW (ref 36.0–46.0)
Hemoglobin: 9 g/dL — ABNORMAL LOW (ref 12.0–15.0)
Immature Granulocytes: 1 %
Lymphocytes Relative: 33 %
Lymphs Abs: 2.3 10*3/uL (ref 0.7–4.0)
MCH: 30.3 pg (ref 26.0–34.0)
MCHC: 29.7 g/dL — ABNORMAL LOW (ref 30.0–36.0)
MCV: 102 fL — ABNORMAL HIGH (ref 80.0–100.0)
Monocytes Absolute: 0.3 10*3/uL (ref 0.1–1.0)
Monocytes Relative: 5 %
Neutro Abs: 4.2 10*3/uL (ref 1.7–7.7)
Neutrophils Relative %: 60 %
Platelets: 180 10*3/uL (ref 150–400)
RBC: 2.97 MIL/uL — ABNORMAL LOW (ref 3.87–5.11)
RDW: 23 % — ABNORMAL HIGH (ref 11.5–15.5)
WBC: 7 10*3/uL (ref 4.0–10.5)
nRBC: 3.1 % — ABNORMAL HIGH (ref 0.0–0.2)

## 2021-12-03 LAB — GLUCOSE, CAPILLARY
Glucose-Capillary: 109 mg/dL — ABNORMAL HIGH (ref 70–99)
Glucose-Capillary: 123 mg/dL — ABNORMAL HIGH (ref 70–99)
Glucose-Capillary: 92 mg/dL (ref 70–99)
Glucose-Capillary: 114 mg/dL — ABNORMAL HIGH (ref 70–99)
Glucose-Capillary: 121 mg/dL — ABNORMAL HIGH (ref 70–99)
Glucose-Capillary: 95 mg/dL (ref 70–99)

## 2021-12-03 NOTE — Progress Notes (Signed)
NAME:  [name], MRN:  [id number], DOB:  10-23-1975, LOS: 32 ADMISSION DATE:  10/06/2021, CONSULTATION DATE: 10/07/2021 REFERRING MD: EDP, CHIEF COMPLAINT: Subarachnoid hemorrhage  History of Present Illness:  46 yo female former smoker was found unresponsive by her husband and EMS called.  She was making gurgling noises with her breathing.  She was found to have SVT and EMS performed DCCV.  She had GCS 4 in ER and intubated for airway protection.  Found to have large SAH and neurosurgery arranged for hospital admission.  PCCM consulted to assist with management in ICU.  Pertinent Medical History:  Hypertension, uterine fibroids Legally blind in left eye  Significant Hospital Events: Including procedures, antibiotic start and stop dates in addition to other pertinent events   9/07 Presented unresponsive, hypotensive; CT head with subarachnoid hemorrhage 9/11 DDAVP give for Central DI, repeat head CT with worsening infarcts and herniation 9/12 Attempted transfer for second opinion at [facility] and [facility].  No beds at [facility] and transfer was declined at [facility] 9/13 No change in mental status.  Transfer to [facility] declined 9/14 [facility] declined transfer again.  No change.  DDAVP x 1 9/18 Palliative care consulted 9/23 Na increased >> restart DDAVP and increase free water 9/25 Plan for trach. Increased pressor requirement. Added midodrine  9/26 D/c D5W. Weaning NE as able. On I/O cath protocol  9/27 Worse renal fxn and new hyperkalemia  9/30 Renal function improving 10/5 Off levo 10/6 Plan for PEG tube 10/7 BPs elevated in AM, monitoring 10/17 PEG deferred in the setting of severe hypernatremia (173), DI. 10/18 No significant neurologic change. Na improved to 152, remains on DDAVP with FWF. Weaning NE as able, BP remains labile. Midodrine initiated. 10/19 No significant neurologic change. DDAVP discontinued. BP improved with addition of midodrine, weaning NE. Increased midodrine to '15mg'$   TID. 10/20 Cortrak clogged this morning, awaiting PEG placement. Na 125 (134), discontinued D5W and FWF. 10/22 Fever spiked, POD1 from PEG  11/1 Persistent anemia of chronic disease.  Patient's family agreed to transfusion today.  11/2 Hypotensive, hypothermic started on vanc/cefepime, low dose levo 11/3 Steroid dose increased, 5% 25g albumin given, off pressor 11/4 RUE picc removed, mid-line placed  Interim History / Subjective:  Afebrile / WBC 7  On vent, full support. 40%, PEEP 5  Glucose range 91-123  I/O 2L UOP, net even for last 24 hours  Objective:  Blood pressure (!) 156/95, pulse 67, temperature (!) 96.4 F (35.8 C), temperature source Axillary, resp. rate 18, height '5\' 2"'$  (1.575 m), weight 76.2 kg, SpO2 100 %.    Vent Mode: AC FiO2 (%):  [40 %] 40 % Set Rate:  [18 bmp] 18 bmp Vt Set:  [400 mL] 400 mL PEEP:  [5 cmH20] 5 cmH20 Plateau Pressure:  [17 cmH20-19 cmH20] 18 cmH20   Intake/Output Summary (Last 24 hours) at 12/03/2021 1006 Last data filed at 12/03/2021 0830 Gross per 24 hour  Intake 1850 ml  Output 1575 ml  Net 275 ml   Filed Weights   11/29/21 0300 12/01/21 0500 12/03/21 0500  Weight: 80 kg 81.4 kg 76.2 kg   Physical Exam: General: critically ill appearing adult female lying in bed in NAD on vent  HEENT: MM pink/moist, trach, protruding tongue Neuro: comatose, pupils 14m / not reactive to light, no response to pain, no cough/gag CV: s1s2 RRR, no m/r/g PULM: non-labored at rest, lungs bilaterally clear  GI: soft, bsx4 active  Extremities: warm/dry, 1+ edema  Skin:  no rashes or lesions  Resolved Hospital Issues  Hypokalemia, resolved Aspiration pneumonia VAP with sepsis AKI  Assessment & Plan:   Subarachnoid Hemorrhage with neurologic devastation, comatose, 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   -continue ongoing care discussions  -continue vent support, ACV for placement  -s/p trach / PEG. 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 S/p course of aranesp, iron -trend CBC  -note refusal of blood products in past, but significant other ultimately allowed   Relative Adrenal Insufficiency -hydrocortisone 20 mg BID   Hypotension PICC removed 11/4, midline in place -follow BP trend   Disposition:  -Pending placement at vent SNF   Best Practice (right click and "Reselect all SmartList Selections" daily)  Diet/type: tubefeeds and NPO w/ meds via tube  DVT prophylaxis: prophylactic heparin  GI prophylaxis: PPI Lines: N/A  Foley:  N/A Code Status: Full Code  Critical Care Time: 31 minutes   Noe Gens, MSN, APRN, NP-C, AGACNP-BC  Pulmonary & Critical Care 12/03/2021, 10:06 AM   Please see [email or web address] for pager details.   From 7A-7P if no response, please call [phone] After hours, please call ELink [phone]

## 2021-12-03 NOTE — TOC Progression Note (Signed)
Transition of Care [facility]) - Progression Note    Patient Details  Name: [name] MRN: [id number] Date of Birth: 14-Nov-1975  Transition of Care [facility]) CM/SW [place], LCSW Phone Number: 12/03/2021, 9:32 AM  Clinical Narrative:    CSW spoke with Santiago Glad at [facility] for updates. She is meeting with the clinical team and will let CSW know if they need anything else.    Expected Discharge Plan: Skilled Nursing Facility Barriers to Discharge: Ship broker, SNF Pending bed offer  Expected Discharge Plan and Services Expected Discharge Plan: [place] In-house Referral: Clinical Social Work, Hospice / Palliative Care Discharge Planning Services: CM Consult Post Acute Care Choice: [place] Living arrangements for the past 2 months: Single Family Home                                       Social Determinants of Health (SDOH) Interventions    Readmission Risk Interventions     No data to display

## 2021-12-03 NOTE — Progress Notes (Signed)
Patient ID: [name], female   DOB: May 28, 1975, 46 y.o.   MRN: [id number] BP 121/76   Pulse 76   Temp 97.7 F (36.5 C) (Axillary)   Resp 18   Ht '5\' 2"'$  (1.575 m)   Wt 76.2 kg   SpO2 100%   BMI 30.73 kg/m  Comatose. No corneal reflex, no gag, no cough,  No improvement neurologically Spoke with son today, they are no closer I believe to finding a suitable location for [name] to go to.

## 2021-12-04 DIAGNOSIS — E870 Hyperosmolality and hypernatremia: Secondary | ICD-10-CM

## 2021-12-04 DIAGNOSIS — G935 Compression of brain: Secondary | ICD-10-CM | POA: Diagnosis not present

## 2021-12-04 DIAGNOSIS — J9611 Chronic respiratory failure with hypoxia: Secondary | ICD-10-CM | POA: Diagnosis not present

## 2021-12-04 DIAGNOSIS — I609 Nontraumatic subarachnoid hemorrhage, unspecified: Secondary | ICD-10-CM | POA: Diagnosis not present

## 2021-12-04 DIAGNOSIS — Z930 Tracheostomy status: Secondary | ICD-10-CM | POA: Diagnosis not present

## 2021-12-04 DIAGNOSIS — Z9911 Dependence on respirator [ventilator] status: Secondary | ICD-10-CM

## 2021-12-04 LAB — BASIC METABOLIC PANEL
Anion gap: 6 (ref 5–15)
BUN: 34 mg/dL — ABNORMAL HIGH (ref 6–20)
CO2: 23 mmol/L (ref 22–32)
Calcium: 7.7 mg/dL — ABNORMAL LOW (ref 8.9–10.3)
Chloride: 120 mmol/L — ABNORMAL HIGH (ref 98–111)
Creatinine, Ser: 0.94 mg/dL (ref 0.44–1.00)
GFR, Estimated: >60 mL/min (ref >60)
Glucose, Bld: 117 mg/dL — ABNORMAL HIGH (ref 70–99)
Potassium: 4.5 mmol/L (ref 3.5–5.1)
Sodium: 149 mmol/L — ABNORMAL HIGH (ref 135–145)

## 2021-12-04 LAB — GLUCOSE, CAPILLARY
Glucose-Capillary: 102 mg/dL — ABNORMAL HIGH (ref 70–99)
Glucose-Capillary: 90 mg/dL (ref 70–99)
Glucose-Capillary: 101 mg/dL — ABNORMAL HIGH (ref 70–99)
Glucose-Capillary: 109 mg/dL — ABNORMAL HIGH (ref 70–99)
Glucose-Capillary: 100 mg/dL — ABNORMAL HIGH (ref 70–99)
Glucose-Capillary: 85 mg/dL (ref 70–99)

## 2021-12-04 NOTE — TOC Progression Note (Addendum)
Transition of Care [facility] - Progression Note    Patient Details  Name: [name] MRN: [id number] Date of Birth: 09/19/1975  Transition of Care [facility]) CM/SW [place], LCSW Phone Number: 12/04/2021, 10:15 AM  Clinical Narrative:    10:15am-CSW reached out to [facility]; awaiting response.   12pm-CSW called and spoke with Santiago Glad at [facility]. She stated that the bed that was opening is now pending due to that patient doing a bed hold while figuring out discharge plans. She will let CSW know the outcome.    Expected Discharge Plan: Skilled Nursing Facility Barriers to Discharge: Ship broker, SNF Pending bed offer  Expected Discharge Plan and Services Expected Discharge Plan: [place] In-house Referral: Clinical Social Work, Hospice / Palliative Care Discharge Planning Services: CM Consult Post Acute Care Choice: [place] Living arrangements for the past 2 months: Single Family Home                                       Social Determinants of Health (SDOH) Interventions    Readmission Risk Interventions     No data to display

## 2021-12-04 NOTE — Progress Notes (Signed)
Patient ID: [name], female   DOB: 12-14-1975, 46 y.o.   MRN: [id number] BP (!) 141/93   Pulse 68   Temp (!) 97.5 F (36.4 C) (Axillary)   Resp 18   Ht '5\' 2"'$  (1.575 m)   Wt 76.2 kg   SpO2 100%   BMI 30.73 kg/m  Comatose, no neurological change Possible bed offer Grim prognosis

## 2021-12-04 NOTE — Progress Notes (Signed)
NAME:  [name], MRN:  [id number], DOB:  October 05, 1975, LOS: 23 ADMISSION DATE:  10/24/2021, CONSULTATION DATE: 09/30/2021 REFERRING MD: EDP, CHIEF COMPLAINT: Subarachnoid hemorrhage  History of Present Illness:  46 yo female former smoker was found unresponsive by her husband and EMS called.  She was making gurgling noises with her breathing.  She was found to have SVT and EMS performed DCCV.  She had GCS 4 in ER and intubated for airway protection.  Found to have large SAH and neurosurgery arranged for hospital admission.  PCCM consulted to assist with management in ICU.  Pertinent Medical History:  Hypertension, uterine fibroids Legally blind in left eye  Significant Hospital Events: Including procedures, antibiotic start and stop dates in addition to other pertinent events   9/07 Presented unresponsive, hypotensive; CT head with subarachnoid hemorrhage 9/11 DDAVP give for Central DI, repeat head CT with worsening infarcts and herniation 9/12 Attempted transfer for second opinion at [facility] and [facility].  No beds at [facility] and transfer was declined at [facility] 9/13 No change in mental status.  Transfer to [facility] declined 9/14 [facility] declined transfer again.  No change.  DDAVP x 1 9/18 Palliative care consulted 9/23 Na increased >> restart DDAVP and increase free water 9/25 Plan for trach. Increased pressor requirement. Added midodrine  9/26 D/c D5W. Weaning NE as able. On I/O cath protocol  9/27 Worse renal fxn and new hyperkalemia  9/30 Renal function improving 10/5 Off levo 10/6 Plan for PEG tube 10/7 BPs elevated in AM, monitoring 10/17 PEG deferred in the setting of severe hypernatremia (173), DI. 10/18 No significant neurologic change. Na improved to 152, remains on DDAVP with FWF. Weaning NE as able, BP remains labile. Midodrine initiated. 10/19 No significant neurologic change. DDAVP discontinued. BP improved with addition of midodrine, weaning NE. Increased midodrine to '15mg'$   TID. 10/20 Cortrak clogged this morning, awaiting PEG placement. Na 125 (134), discontinued D5W and FWF. 10/22 Fever spiked, POD1 from PEG  11/1 Persistent anemia of chronic disease.  Patient's family agreed to transfusion today.  11/2 Hypotensive, hypothermic started on vanc/cefepime, low dose levo 11/3 Steroid dose increased, 5% 25g albumin given, off pressor 11/4 RUE picc removed, mid-line placed  11/8 Continuing to work on facility placement without success   Interim History / Subjective:  NAEO   Family is asleep in the room  Objective:  Blood pressure (!) 137/90, pulse 71, temperature (!) 97.5 F (36.4 C), temperature source Axillary, resp. rate 18, height '5\' 2"'$  (1.575 m), weight 76.2 kg, SpO2 100 %.    Vent Mode: AC FiO2 (%):  [40 %] 40 % Set Rate:  [18 bmp] 18 bmp Vt Set:  [400 mL] 400 mL PEEP:  [5 cmH20] 5 cmH20 Plateau Pressure:  [17 cmH20-19 cmH20] 18 cmH20   Intake/Output Summary (Last 24 hours) at 12/04/2021 1001 Last data filed at 12/04/2021 1000 Gross per 24 hour  Intake 1900 ml  Output 1675 ml  Net 225 ml   Filed Weights   11/29/21 0300 12/01/21 0500 12/03/21 0500  Weight: 80 kg 81.4 kg 76.2 kg   Physical Exam: General: Critically ill middle aged F  HEENT: Protruding tongue wrapped in gauze. Trach secure  Neuro: 48m fixed pupils bilaterally. Does not follow commands. Flexion to pain CV: rrr s1s2  PULM: Symmetrical chest expansion, mechanically ventilated  GI: soft ndnt  Extremities: +1 edema. No acute joint deformity   Skin: c/d/w   Resolved Hospital Issues  Hypokalemia, resolved Aspiration pneumonia VAP  with sepsis -- stenotrophomonas maltophilia AKI  Assessment & Plan:    SAH with brain compression and herniation syndrome- devastating neurologic prognosis VDRF S/p tracheostomy  Enlarged tongue  Hypernatremia due to central DI due to severe neurologic dysfunction from [facility]  Anemia of chronic disease and critical illness  Relative adrenal  insufficiency  -Dismal neuro prognosis -Despite this, family has remained adamant about pursuit of full scope of offered medical interventions & prolonged supportive care. -Both palliative care and ethics have been involved this admission.   -Note-- refusal of blood products in past, but significant other ultimately allowed   P -Cont efforts at facility placement -follow serial neuro exam -Cont full MV support on VC mode, routine trach care, VAP bundle and pulm hygiene   -EN via PEG  -DDAVP, FWF -weekly labs + PRN -- goal to minimize phlebotomy -hydrocortisone 20 mg BID  -follow BP  -cont saline wrapped gauze to tongue   Disposition:  -Pending placement at vent SNF   Best Practice (right click and "Reselect all SmartList Selections" daily)  Diet/type: tubefeeds and NPO w/ meds via tube  DVT prophylaxis: prophylactic heparin  GI prophylaxis: PPI Lines: N/A  Foley:  N/A Code Status: Full Code  CRITICAL CARE Performed by: Cristal Generous   Total critical care time: 31 minutes  Critical care time was exclusive of separately billable procedures and treating other patients. Critical care was necessary to treat or prevent imminent or life-threatening deterioration.  Critical care was time spent personally by me on the following activities: development of treatment plan with patient and/or surrogate as well as nursing, discussions with consultants, evaluation of patient's response to treatment, examination of patient, obtaining history from patient or surrogate, ordering and performing treatments and interventions, ordering and review of laboratory studies, ordering and review of radiographic studies, pulse oximetry and re-evaluation of patient's condition.  Eliseo Gum MSN, AGACNP-BC [place] for pager  12/04/2021, 10:01 AM

## 2021-12-05 DIAGNOSIS — S066X9A Traumatic subarachnoid hemorrhage with loss of consciousness of unspecified duration, initial encounter: Secondary | ICD-10-CM | POA: Diagnosis not present

## 2021-12-05 DIAGNOSIS — Z930 Tracheostomy status: Secondary | ICD-10-CM

## 2021-12-05 LAB — CBC WITH DIFFERENTIAL/PLATELET
Abs Immature Granulocytes: 0.02 10*3/uL (ref 0.00–0.07)
Basophils Absolute: 0.1 10*3/uL (ref 0.0–0.1)
Basophils Relative: 1 %
Eosinophils Absolute: 0.1 10*3/uL (ref 0.0–0.5)
Eosinophils Relative: 2 %
HCT: 33.2 % — ABNORMAL LOW (ref 36.0–46.0)
Hemoglobin: 9.7 g/dL — ABNORMAL LOW (ref 12.0–15.0)
Immature Granulocytes: 0 %
Lymphocytes Relative: 43 %
Lymphs Abs: 2.5 10*3/uL (ref 0.7–4.0)
MCH: 31 pg (ref 26.0–34.0)
MCHC: 29.2 g/dL — ABNORMAL LOW (ref 30.0–36.0)
MCV: 106.1 fL — ABNORMAL HIGH (ref 80.0–100.0)
Monocytes Absolute: 0.5 10*3/uL (ref 0.1–1.0)
Monocytes Relative: 8 %
Neutro Abs: 2.7 10*3/uL (ref 1.7–7.7)
Neutrophils Relative %: 46 %
Platelets: 180 10*3/uL (ref 150–400)
RBC: 3.13 MIL/uL — ABNORMAL LOW (ref 3.87–5.11)
RDW: 23 % — ABNORMAL HIGH (ref 11.5–15.5)
WBC: 5.8 10*3/uL (ref 4.0–10.5)
nRBC: 2.2 % — ABNORMAL HIGH (ref 0.0–0.2)

## 2021-12-05 LAB — COMPREHENSIVE METABOLIC PANEL
ALT: 339 U/L — ABNORMAL HIGH (ref 0–44)
AST: 236 U/L — ABNORMAL HIGH (ref 15–41)
Albumin: 2.7 g/dL — ABNORMAL LOW (ref 3.5–5.0)
Alkaline Phosphatase: 192 U/L — ABNORMAL HIGH (ref 38–126)
Anion gap: 6 (ref 5–15)
BUN: 33 mg/dL — ABNORMAL HIGH (ref 6–20)
CO2: 23 mmol/L (ref 22–32)
Calcium: 8 mg/dL — ABNORMAL LOW (ref 8.9–10.3)
Chloride: 125 mmol/L — ABNORMAL HIGH (ref 98–111)
Creatinine, Ser: 0.98 mg/dL (ref 0.44–1.00)
GFR, Estimated: >60 mL/min (ref >60)
Glucose, Bld: 93 mg/dL (ref 70–99)
Potassium: 4.7 mmol/L (ref 3.5–5.1)
Sodium: 154 mmol/L — ABNORMAL HIGH (ref 135–145)
Total Bilirubin: 0.3 mg/dL (ref 0.3–1.2)
Total Protein: 6.4 g/dL — ABNORMAL LOW (ref 6.5–8.1)

## 2021-12-05 LAB — GLUCOSE, CAPILLARY
Glucose-Capillary: 100 mg/dL — ABNORMAL HIGH (ref 70–99)
Glucose-Capillary: 107 mg/dL — ABNORMAL HIGH (ref 70–99)
Glucose-Capillary: 80 mg/dL (ref 70–99)
Glucose-Capillary: 83 mg/dL (ref 70–99)
Glucose-Capillary: 95 mg/dL (ref 70–99)
Glucose-Capillary: 98 mg/dL (ref 70–99)

## 2021-12-05 NOTE — IPAL (Signed)
  Interdisciplinary Goals of Care Family Meeting   Date carried out: 12/05/2021  Location of the meeting: Bedside  Member's involved: Nurse Practitioner and Family Member or next of kin  Durable Power of Attorney or acting medical decision maker: Husband  (with close involvement of pt son)  Discussion: We discussed goals of care for [name] .  Discussed clinical case. I asked [name] about what the goal for [name]'s care is. He indicated that he knows her prognosis is poor and feels that ongoing care is contributing to suffering, but is having a hard time navigating these talks with the family (blended family). I encouraged consideration of comfort care-- the neurologic prognosis is exceedingly poor and long term care will simply provide supportive mechanical care. He asked that I talk with his stepson (pt son) this morning when he arrives.   I spoke w pt son at bedside a couple hours later. He also understands the devastating prognosis, and has been trying to talk with other family members (pt daughters, pt father, pt brother) about this, as he too worries that current care is prolonging suffering. He is having a hard time knowing when to talk with his step-dad about how long this should continue, and has concerns about how other family members would react. It sounds like several family members are struggling with grief and a bit of denial.   I encouraged him to speak with his step dad and said that from a medical perspective, it is wholly appropriate to pursue comfort measures at this time. We talked in depth about what this entails. We also talked about what a the likely clinical trajectory with long-term care would involve, and he does not this his mom would want that in terms of quality of life.   Plan is to continue current care at this time while family has a chance to talk further. It might be helpful to coordinate a meeting where the pt husband and pt son can physically be together  as both parties voiced uncertainty with how to have this emotionally difficult discussion with the other.    Code status: Full Code  Disposition: Continue current acute care  Time spent for the meeting: 45 minutes    Shawna Generous, NP 12/05/2021, 10:45 AM

## 2021-12-05 NOTE — Progress Notes (Signed)
Patient ID: [name], female   DOB: 1976/01/08, 46 y.o.   MRN: [id number] BP 117/76   Pulse 69   Temp (!) 97.4 F (36.3 C) (Axillary)   Resp 18   Ht '5\' 2"'$  (1.575 m)   Wt 76.2 kg   SpO2 99%   BMI 30.73 kg/m  Comatose Pupils fixed dilated, no response to light No corneal reflex No cough, gag No oculocephalic reflex No neurological change, no neurological improvement Family trying to find a facility for placement

## 2021-12-05 NOTE — Progress Notes (Signed)
NAME:  [name], MRN:  [id number], DOB:  1975-12-24, LOS: 79 ADMISSION DATE:  10/21/2021, CONSULTATION DATE: 09/26/2021 REFERRING MD: EDP, CHIEF COMPLAINT: Subarachnoid hemorrhage  History of Present Illness:  46 yo female former smoker was found unresponsive by her husband and EMS called.  She was making gurgling noises with her breathing.  She was found to have SVT and EMS performed DCCV.  She had GCS 4 in ER and intubated for airway protection.  Found to have large SAH and neurosurgery arranged for hospital admission.  PCCM consulted to assist with management in ICU.  Pertinent Medical History:  Hypertension, uterine fibroids Legally blind in left eye  Significant Hospital Events: Including procedures, antibiotic start and stop dates in addition to other pertinent events   9/07 Presented unresponsive, hypotensive; CT head with subarachnoid hemorrhage 9/11 DDAVP give for Central DI, repeat head CT with worsening infarcts and herniation 9/12 Attempted transfer for second opinion at [facility] and [facility].  No beds at [facility] and transfer was declined at [facility] 9/13 No change in mental status.  Transfer to [facility] declined 9/14 [facility] declined transfer again.  No change.  DDAVP x 1 9/18 Palliative care consulted 9/23 Na increased >> restart DDAVP and increase free water 9/25 Plan for trach. Increased pressor requirement. Added midodrine  9/26 D/c D5W. Weaning NE as able. On I/O cath protocol  9/27 Worse renal fxn and new hyperkalemia  9/30 Renal function improving 10/5 Off levo 10/6 Plan for PEG tube 10/7 BPs elevated in AM, monitoring 10/17 PEG deferred in the setting of severe hypernatremia (173), DI. 10/18 No significant neurologic change. Na improved to 152, remains on DDAVP with FWF. Weaning NE as able, BP remains labile. Midodrine initiated. 10/19 No significant neurologic change. DDAVP discontinued. BP improved with addition of midodrine, weaning NE. Increased midodrine to '15mg'$   TID. 10/20 Cortrak clogged this morning, awaiting PEG placement. Na 125 (134), discontinued D5W and FWF. 10/22 Fever spiked, POD1 from PEG  11/1 Persistent anemia of chronic disease.  Patient's family agreed to transfusion today.  11/2 Hypotensive, hypothermic started on vanc/cefepime, low dose levo 11/3 Steroid dose increased, 5% 25g albumin given, off pressor 11/4 RUE picc removed, mid-line placed  11/8 Continuing to work on facility placement without success  11/10 Goc talk w husband and son  Interim History / Subjective:  Husband at bedside, later son at bedside  No resp effort on PSV   Objective:  Blood pressure 126/83, pulse 71, temperature (!) 96.8 F (36 C), temperature source Axillary, resp. rate 18, height '5\' 2"'$  (1.575 m), weight 76.2 kg, SpO2 100 %.    Vent Mode: AC FiO2 (%):  [40 %] 40 % Set Rate:  [18 bmp] 18 bmp Vt Set:  [400 mL] 400 mL PEEP:  [5 cmH20] 5 cmH20 Plateau Pressure:  [17 cmH20-19 cmH20] 17 cmH20   Intake/Output Summary (Last 24 hours) at 12/05/2021 1039 Last data filed at 12/05/2021 1000 Gross per 24 hour  Intake 1100 ml  Output 1650 ml  Net -550 ml   Filed Weights   11/29/21 0300 12/01/21 0500 12/03/21 0500  Weight: 80 kg 81.4 kg 76.2 kg   Physical Exam: General: Critically ill middle aged F trach/vent HEENT: Protruding tongue wrapped in gauze. Trach secure  Neuro: 35m fixed pupils. Flexion to pain. No cough, gag, corneal. No spontaneous respiratory drive on PSV  CV: rrr cap refill < 3 sec  PULM: Mechanically ventilated  GI: soft  Extremities: Edematous extremities, decreased BLE  muscle mass.  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 Acute respiratory failure  Vent dependency  S/p tracheostomy  Enlarged tongue Hypernatremia due to central DI due to [facility] Anemia  of critical illness and chronic disease Relative adrenal insufficiency  -Dismal neuro prognosis -Both palliative care and ethics have been involved this admission and thus far full scope of medical care has been pursued  -Note-- refusal of blood products in past, but significant other ultimately allowed  P -Cont efforts at facility placement -follow serial neuro exam -Cont full MV support on VC mode, routine trach care, VAP bundle and pulm hygiene   -EN via PEG  -DDAVP, FWF -weekly labs + PRN -- goal to minimize phlebotomy -hydrocortisone 20 mg BID  -follow BP  -cont saline wrapped gauze to tongue   Disposition:  -Pending placement at vent SNF vs GOC -- see separate documentation re [facility] 11/10  Best Practice (right click and "Reselect all SmartList Selections" daily)  Diet/type: tubefeeds and NPO w/ meds via tube  DVT prophylaxis: prophylactic heparin  GI prophylaxis: PPI Lines: N/A  Foley:  N/A Code Status: Full Code  CCT: n/a   Eliseo Gum MSN, AGACNP-BC [place] for pager  12/05/2021, 10:39 AM

## 2021-12-06 DIAGNOSIS — I609 Nontraumatic subarachnoid hemorrhage, unspecified: Secondary | ICD-10-CM | POA: Diagnosis not present

## 2021-12-06 LAB — GLUCOSE, CAPILLARY
Glucose-Capillary: 82 mg/dL (ref 70–99)
Glucose-Capillary: 90 mg/dL (ref 70–99)
Glucose-Capillary: 91 mg/dL (ref 70–99)
Glucose-Capillary: 97 mg/dL (ref 70–99)
Glucose-Capillary: 69 mg/dL — ABNORMAL LOW (ref 70–99)
Glucose-Capillary: 72 mg/dL (ref 70–99)
Glucose-Capillary: 96 mg/dL (ref 70–99)

## 2021-12-06 MED ORDER — FREE WATER
100.0000 mL | Status: DC
  Administered 2021-12-06 – 2021-12-07 (×11): 100 mL

## 2021-12-06 NOTE — Progress Notes (Signed)
NAME:  [name], MRN:  [id number], DOB:  October 20, 1975, LOS: 75 ADMISSION DATE:  10/24/2021, CONSULTATION DATE: 10/04/2021 REFERRING MD: EDP, CHIEF COMPLAINT: Subarachnoid hemorrhage  History of Present Illness:  46 yo female former smoker was found unresponsive by her husband and EMS called.  She was making gurgling noises with her breathing.  She was found to have SVT and EMS performed DCCV.  She had GCS 4 in ER and intubated for airway protection.  Found to have large SAH and neurosurgery arranged for hospital admission.  PCCM consulted to assist with management in ICU.  Pertinent Medical History:  Hypertension, uterine fibroids Legally blind in left eye  Significant Hospital Events: Including procedures, antibiotic start and stop dates in addition to other pertinent events   9/07 Presented unresponsive, hypotensive; CT head with subarachnoid hemorrhage 9/11 DDAVP give for Central DI, repeat head CT with worsening infarcts and herniation 9/12 Attempted transfer for second opinion at [facility] and [facility].  No beds at [facility] and transfer was declined at [facility] 9/13 No change in mental status.  Transfer to [facility] declined 9/14 [facility] declined transfer again.  No change.  DDAVP x 1 9/18 Palliative care consulted 9/23 Na increased >> restart DDAVP and increase free water 9/25 Plan for trach. Increased pressor requirement. Added midodrine  9/26 D/c D5W. Weaning NE as able. On I/O cath protocol  9/27 Worse renal fxn and new hyperkalemia  9/30 Renal function improving 10/5 Off levo 10/6 Plan for PEG tube 10/7 BPs elevated in AM, monitoring 10/17 PEG deferred in the setting of severe hypernatremia (173), DI. 10/18 No significant neurologic change. Na improved to 152, remains on DDAVP with FWF. Weaning NE as able, BP remains labile. Midodrine initiated. 10/19 No significant neurologic change. DDAVP discontinued. BP improved with addition of midodrine, weaning NE. Increased midodrine to '15mg'$   TID. 10/20 Cortrak clogged this morning, awaiting PEG placement. Na 125 (134), discontinued D5W and FWF. 10/22 Fever spiked, POD1 from PEG  11/1 Persistent anemia of chronic disease.  Patient's family agreed to transfusion today.  11/2 Hypotensive, hypothermic started on vanc/cefepime, low dose levo 11/3 Steroid dose increased, 5% 25g albumin given, off pressor 11/4 RUE picc removed, mid-line placed  11/8 Continuing to work on facility placement without success  11/10 Goc talk w husband and son  Interim History / Subjective:  Spouse at bedside No change overnight  Objective:  Blood pressure (!) 166/108, pulse 72, temperature (!) 96.8 F (36 C), temperature source Axillary, resp. rate 18, height '5\' 2"'$  (1.575 m), weight 77 kg, SpO2 100 %.    Vent Mode: AC FiO2 (%):  [40 %] 40 % Set Rate:  [18 bmp] 18 bmp Vt Set:  [400 mL] 400 mL PEEP:  [5 cmH20] 5 cmH20 Plateau Pressure:  [18 cmH20-19 cmH20] 19 cmH20   Intake/Output Summary (Last 24 hours) at 12/06/2021 1005 Last data filed at 12/06/2021 0900 Gross per 24 hour  Intake 1500 ml  Output 1930 ml  Net -430 ml   Filed Weights   12/01/21 0500 12/03/21 0500 12/06/21 0500  Weight: 81.4 kg 76.2 kg 77 kg   Physical Exam: General: Critically ill-appearing HEENT: Tongue wrapped in gauze, trach site noted Neuro: Pupils are fixed, no gag, no corneal, no spontaneous respiratory drive.  CV: S1-S2 appreciated PULM: Clear breath sounds GI: Soft, bowel sounds appreciated Extremities: Bilateral edema Skin: c/d/w  Resolved Hospital Issues  Hypokalemia, resolved Aspiration pneumonia VAP with sepsis -- stenotrophomonas maltophilia AKI  Assessment & Plan:  Subarachnoid hemorrhage with brain compression and herniation syndrome Acute respiratory failure Ventilator dependent Tongue swelling Hypernatremia due to central DI due to S AH Anemia of critical illness Adrenal insufficiency  Prognosis remains grim Palliative care, ethics  involved Refusal of blood products Continue saline drops with tongue Hydrocortisone 20 mg twice daily Maintain hemodynamic support Continue full vent support  Goals of care discussions ongoing  Trying to minimize blood draws but need to check on patient's sodium levels  Disposition -Pending placement skilled nursing facility  Best Practice (right click and "Reselect all SmartList Selections" daily)  Diet/type: tubefeeds and NPO w/ meds via tube  DVT prophylaxis: prophylactic heparin  GI prophylaxis: PPI Lines: N/A  Foley:  N/A Code Status: Full Code  Sherrilyn Rist, MD [place] PCCM Pager: See Shea Evans

## 2021-12-07 DIAGNOSIS — I609 Nontraumatic subarachnoid hemorrhage, unspecified: Secondary | ICD-10-CM | POA: Diagnosis not present

## 2021-12-07 LAB — GLUCOSE, CAPILLARY
Glucose-Capillary: 81 mg/dL (ref 70–99)
Glucose-Capillary: 91 mg/dL (ref 70–99)
Glucose-Capillary: 81 mg/dL (ref 70–99)
Glucose-Capillary: 79 mg/dL (ref 70–99)
Glucose-Capillary: 98 mg/dL (ref 70–99)
Glucose-Capillary: 83 mg/dL (ref 70–99)

## 2021-12-07 LAB — BASIC METABOLIC PANEL
Anion gap: 9 (ref 5–15)
BUN: 37 mg/dL — ABNORMAL HIGH (ref 6–20)
CO2: 21 mmol/L — ABNORMAL LOW (ref 22–32)
Calcium: 8.2 mg/dL — ABNORMAL LOW (ref 8.9–10.3)
Chloride: 123 mmol/L — ABNORMAL HIGH (ref 98–111)
Creatinine, Ser: 0.96 mg/dL (ref 0.44–1.00)
GFR, Estimated: >60 mL/min (ref >60)
Glucose, Bld: 97 mg/dL (ref 70–99)
Potassium: 5 mmol/L (ref 3.5–5.1)
Sodium: 153 mmol/L — ABNORMAL HIGH (ref 135–145)

## 2021-12-07 MED ORDER — FREE WATER
100.0000 mL | Status: DC
  Administered 2021-12-07 – 2021-12-08 (×8): 100 mL

## 2021-12-07 NOTE — Progress Notes (Signed)
NAME:  [name], MRN:  [id number], DOB:  May 16, 1975, LOS: 70 ADMISSION DATE:  10/22/2021, CONSULTATION DATE: 10/11/2021 REFERRING MD: EDP, CHIEF COMPLAINT: Subarachnoid hemorrhage  History of Present Illness:  46 yo female former smoker was found unresponsive by her husband and EMS called.  She was making gurgling noises with her breathing.  She was found to have SVT and EMS performed DCCV.  She had GCS 4 in ER and intubated for airway protection.  Found to have large SAH and neurosurgery arranged for hospital admission.  PCCM consulted to assist with management in ICU.  Pertinent Medical History:  Hypertension, uterine fibroids Legally blind in left eye  Significant Hospital Events: Including procedures, antibiotic start and stop dates in addition to other pertinent events   9/07 Presented unresponsive, hypotensive; CT head with subarachnoid hemorrhage 9/11 DDAVP give for Central DI, repeat head CT with worsening infarcts and herniation 9/12 Attempted transfer for second opinion at [facility].  No beds at [facility] and transfer was declined at [facility] 9/13 No change in mental status.  Transfer to [facility] declined 9/14 [facility] declined transfer again.  No change.  DDAVP x 1 9/18 Palliative care consulted 9/23 Na increased >> restart DDAVP and increase free water 9/25 Plan for trach. Increased pressor requirement. Added midodrine  9/26 D/c D5W. Weaning NE as able. On I/O cath protocol  9/27 Worse renal fxn and new hyperkalemia  9/30 Renal function improving 10/5 Off levo 10/6 Plan for PEG tube 10/7 BPs elevated in AM, monitoring 10/17 PEG deferred in the setting of severe hypernatremia (173), DI. 10/18 No significant neurologic change. Na improved to 152, remains on DDAVP with FWF. Weaning NE as able, BP remains labile. Midodrine initiated. 10/19 No significant neurologic change. DDAVP discontinued. BP improved with addition of midodrine, weaning NE. Increased midodrine to '15mg'$   TID. 10/20 Cortrak clogged this morning, awaiting PEG placement. Na 125 (134), discontinued D5W and FWF. 10/22 Fever spiked, POD1 from PEG  11/1 Persistent anemia of chronic disease.  Patient's family agreed to transfusion today.  11/2 Hypotensive, hypothermic started on vanc/cefepime, low dose levo 11/3 Steroid dose increased, 5% 25g albumin given, off pressor 11/4 RUE picc removed, mid-line placed  11/8 Continuing to work on facility placement without success  11/10 Goc talk w husband and son  Interim History / Subjective:  Spouse at bedside  No overnight events    Objective:  Blood pressure (!) 140/83, pulse 89, temperature 97.9 F (36.6 C), temperature source Axillary, resp. rate 18, height '5\' 2"'$  (1.575 m), weight 77 kg, SpO2 99 %.    Vent Mode: AC FiO2 (%):  [40 %] 40 % Set Rate:  [18 bmp] 18 bmp Vt Set:  [400 mL] 400 mL PEEP:  [5 cmH20] 5 cmH20 Plateau Pressure:  [16 cmH20-20 cmH20] 20 cmH20   Intake/Output Summary (Last 24 hours) at 12/07/2021 1239 Last data filed at 12/07/2021 1200 Gross per 24 hour  Intake 2190 ml  Output 1250 ml  Net 940 ml   Filed Weights   12/03/21 0500 12/06/21 0500 12/07/21 0500  Weight: 76.2 kg 77 kg 77 kg   Physical Exam: General: Critically ill-appearing HEENT: Tongue remain wrapped in gauze, trach site noted Neuro: Pupils are fixed, no gag, no corneal, no spontaneous respiratory drive CV: B3-Z3 appreciated PULM: Clear breath sounds GI: Bowel sounds appreciated Extremities: Edema Skin: c/d/w  Resolved Hospital Issues  Hypokalemia, resolved Aspiration pneumonia VAP with sepsis -- stenotrophomonas maltophilia AKI  Assessment & Plan:  Subarachnoid hemorrhage with brain compression and initiation syndrome Acute respiratory failure Ventilator dependence Tongue swelling 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

## 2021-12-08 DIAGNOSIS — J9601 Acute respiratory failure with hypoxia: Secondary | ICD-10-CM | POA: Diagnosis not present

## 2021-12-08 DIAGNOSIS — I609 Nontraumatic subarachnoid hemorrhage, unspecified: Secondary | ICD-10-CM | POA: Diagnosis not present

## 2021-12-08 DIAGNOSIS — E232 Diabetes insipidus: Secondary | ICD-10-CM

## 2021-12-08 DIAGNOSIS — Z9911 Dependence on respirator [ventilator] status: Secondary | ICD-10-CM | POA: Diagnosis not present

## 2021-12-08 LAB — GLUCOSE, CAPILLARY
Glucose-Capillary: 95 mg/dL (ref 70–99)
Glucose-Capillary: 98 mg/dL (ref 70–99)

## 2021-12-08 MED ORDER — FREE WATER: 125.0000 mL | Status: DC

## 2021-12-08 MED ORDER — FREE WATER
100.0000 mL | Status: DC
  Administered 2021-12-08 – 2021-12-10 (×27): 100 mL

## 2021-12-08 NOTE — TOC Progression Note (Signed)
Transition of Care [facility]) - Progression Note    Patient Details  Name: [name] MRN: [id number] Date of Birth: September 17, 1975  Transition of Care [facility]) CM/SW [place], LCSW Phone Number: 12/08/2021, 11:54 AM  Clinical Narrative:    Still awaiting bed availability for [facility].    Expected Discharge Plan: [place] Barriers to Discharge: Ship broker, SNF Pending bed offer  Expected Discharge Plan and Services Expected Discharge Plan: [place] In-house Referral: Clinical Social Work, Hospice / Palliative Care Discharge Planning Services: CM Consult Post Acute Care Choice: [place] Living arrangements for the past 2 months: Single Family Home                                       Social Determinants of Health (SDOH) Interventions    Readmission Risk Interventions     No data to display

## 2021-12-08 NOTE — Progress Notes (Addendum)
NAME:  [name], MRN:  [id number], DOB:  [birth year], LOS: 38 ADMISSION DATE:  10/16/2021, CONSULTATION DATE: 10/25/2021 REFERRING MD: EDP, CHIEF COMPLAINT: Subarachnoid hemorrhage  History of Present Illness:  46 yo female former smoker was found unresponsive by her husband and EMS called.  She was making gurgling noises with her breathing.  She was found to have SVT and EMS performed DCCV.  She had GCS 4 in ER and intubated for airway protection.  Found to have large SAH and neurosurgery arranged for hospital admission.  PCCM consulted to assist with management in ICU.  Pertinent Medical History:  Hypertension, uterine fibroids Legally blind in left eye  Significant Hospital Events: Including procedures, antibiotic start and stop dates in addition to other pertinent events   9/07 Presented unresponsive, hypotensive; CT head with subarachnoid hemorrhage 9/11 DDAVP give for Central DI, repeat head CT with worsening infarcts and herniation 9/12 Attempted transfer for second opinion at [facility].  No beds at [facility] and transfer was declined at [facility] 9/13 No change in mental status.  Transfer to [facility] declined 9/14 [facility] declined transfer again.  No change.  DDAVP x 1 9/18 Palliative care consulted 9/23 Na increased >> restart DDAVP and increase free water 9/25 Plan for trach. Increased pressor requirement. Added midodrine  9/26 D/c D5W. Weaning NE as able. On I/O cath protocol  9/27 Worse renal fxn and new hyperkalemia  9/30 Renal function improving 10/5 Off levo 10/6 Plan for PEG tube 10/7 BPs elevated in AM, monitoring 10/17 PEG deferred in the setting of severe hypernatremia (173), DI. 10/18 No significant neurologic change. Na improved to 152, remains on DDAVP with FWF. Weaning NE as able, BP remains labile. Midodrine initiated. 10/19 No significant neurologic change. DDAVP discontinued. BP improved with addition of midodrine, weaning NE. Increased midodrine to '15mg'$   TID. 10/20 Cortrak clogged this morning, awaiting PEG placement. Na 125 (134), discontinued D5W and FWF. 10/22 Fever spiked, POD1 from PEG  11/1 Persistent anemia of chronic disease.  Patient's family agreed to transfusion today.  11/2 Hypotensive, hypothermic started on vanc/cefepime, low dose levo 11/3 Steroid dose increased, 5% 25g albumin given, off pressor 11/4 RUE picc removed, mid-line placed  11/8 Continuing to work on facility placement without success  11/10 Goc talk w husband and son  Interim History / Subjective:  Family at bedside, no acute issues overnight.    Objective:  Blood pressure 117/76, pulse 71, temperature (!) 96.8 F (36 C), temperature source Axillary, resp. rate 18, height '5\' 2"'$  (1.575 m), weight 74.2 kg, SpO2 99 %.    Vent Mode: PRVC FiO2 (%):  [40 %] 40 % Set Rate:  [18 bmp] 18 bmp Vt Set:  [400 mL] 400 mL PEEP:  [5 cmH20] 5 cmH20 Plateau Pressure:  [19 cmH20-21 cmH20] 21 cmH20   Intake/Output Summary (Last 24 hours) at 12/08/2021 0737 Last data filed at 12/08/2021 0600 Gross per 24 hour  Intake 2050 ml  Output 2250 ml  Net -200 ml    Filed Weights   12/06/21 0500 12/07/21 0500 12/08/21 0432  Weight: 77 kg 77 kg 74.2 kg   Physical Exam:  General: adult female on vent HEENT: Tongue protruding, wrapped in gauze.  Neuro: Pupils fixed, dilated, unresponsive.  CV: RRR, no MRG PULM: Clear bilateral breath sounds GI: normoactive. PEG.  Extremities: edematous.  Skin: grossly intact.    Resolved Hospital Issues  Hypokalemia, resolved Aspiration pneumonia VAP with sepsis -- stenotrophomonas maltophilia AKI  Assessment &  Plan:    Subarachnoid hemorrhage with brain compression and herniation syndrome Acute respiratory failure Ventilator dependence s/p tracheostomy Tongue swelling Central DI due to [facility] Anemia critical illness Adrenal insufficiency  Prognosis remains grim. Both palliative care and ethics have been involved this admission  and thus far full scope of medical care has been pursued    -Continue full ventilator support. Volume control. Routine trach care.  -Hydrocortisone 20 mg twice daily -Maintain hemodynamic support -DDAVP -Tube feeds via PEG -Free water flushes.  -Repeat BMP tomorrow to trend sodium. Minimize phlebotomy.  - No insulin for several days. CBG fine. DC checks and insulin.   Disposition -Pending placement skilled nursing facility   Best Practice (right click and "Reselect all SmartList Selections" daily)  Diet/type: tubefeeds and NPO w/ meds via tube  DVT prophylaxis: prophylactic heparin  GI prophylaxis: PPI Lines: N/A  Foley:  N/A Code Status: Full Code   Georgann Housekeeper, AGACNP-BC [place] for personal pager PCCM on call pager [phone] until 7pm. Please call Elink 7p-7a. [phone]  12/08/2021 7:42 AM

## 2021-12-09 DIAGNOSIS — I609 Nontraumatic subarachnoid hemorrhage, unspecified: Secondary | ICD-10-CM | POA: Diagnosis not present

## 2021-12-09 LAB — BASIC METABOLIC PANEL
Anion gap: 6 (ref 5–15)
Anion gap: 6 (ref 5–15)
BUN: 41 mg/dL — ABNORMAL HIGH (ref 6–20)
BUN: 42 mg/dL — ABNORMAL HIGH (ref 6–20)
CO2: 21 mmol/L — ABNORMAL LOW (ref 22–32)
CO2: 19 mmol/L — ABNORMAL LOW (ref 22–32)
Calcium: 8.5 mg/dL — ABNORMAL LOW (ref 8.9–10.3)
Calcium: 8 mg/dL — ABNORMAL LOW (ref 8.9–10.3)
Chloride: 125 mmol/L — ABNORMAL HIGH (ref 98–111)
Chloride: 126 mmol/L — ABNORMAL HIGH (ref 98–111)
Creatinine, Ser: 1.19 mg/dL — ABNORMAL HIGH (ref 0.44–1.00)
Creatinine, Ser: 1.09 mg/dL — ABNORMAL HIGH (ref 0.44–1.00)
GFR, Estimated: 57 mL/min — ABNORMAL LOW (ref >60)
GFR, Estimated: >60 mL/min (ref >60)
Glucose, Bld: 78 mg/dL (ref 70–99)
Glucose, Bld: 95 mg/dL (ref 70–99)
Potassium: 5.3 mmol/L — ABNORMAL HIGH (ref 3.5–5.1)
Potassium: 5.2 mmol/L — ABNORMAL HIGH (ref 3.5–5.1)
Sodium: 152 mmol/L — ABNORMAL HIGH (ref 135–145)
Sodium: 151 mmol/L — ABNORMAL HIGH (ref 135–145)

## 2021-12-09 MED ORDER — DOCUSATE SODIUM 50 MG/5ML PO LIQD
100.0000 mg | Freq: Two times a day (BID) | ORAL | Status: DC
  Administered 2021-12-09 – 2021-12-13 (×9): 100 mg
  Filled 2021-12-09 (×9): qty 10

## 2021-12-09 MED ORDER — HYDRALAZINE HCL 20 MG/ML IJ SOLN
10.0000 mg | INTRAMUSCULAR | Status: AC | PRN
  Administered 2021-12-09 (×2): 20 mg via INTRAVENOUS
  Administered 2021-12-09: 10 mg via INTRAVENOUS
  Administered 2021-12-10 (×2): 20 mg via INTRAVENOUS
  Administered 2021-12-10 (×2): 10 mg via INTRAVENOUS
  Administered 2021-12-11: 20 mg via INTRAVENOUS
  Filled 2021-12-09 (×8): qty 1

## 2021-12-09 MED ORDER — SODIUM CHLORIDE 0.9 % IV BOLUS
1000.0000 mL | Freq: Once | INTRAVENOUS | Status: AC
  Administered 2021-12-09: 1000 mL via INTRAVENOUS

## 2021-12-09 MED ORDER — HEPARIN SODIUM (PORCINE) 5000 UNIT/ML IJ SOLN
5000.0000 [IU] | Freq: Three times a day (TID) | INTRAMUSCULAR | Status: DC
  Administered 2021-12-09 – 2022-01-01 (×69): 5000 [IU] via SUBCUTANEOUS
  Filled 2021-12-09 (×66): qty 1

## 2021-12-09 MED ORDER — SODIUM CHLORIDE 0.9 % IV SOLN: INTRAVENOUS | Status: DC

## 2021-12-09 NOTE — Progress Notes (Signed)
Patient ID: [name], female   DOB: 07-Feb-1975, 46 y.o.   MRN: [id number] BP 135/86   Pulse 83   Temp 98.8 F (37.1 C) (Axillary)   Resp 18   Ht '5\' 2"'$  (1.575 m)   Wt 74.2 kg   SpO2 97%   BMI 29.92 kg/m  Comatose, no neurological changes. There are no new recommendations.

## 2021-12-09 NOTE — Progress Notes (Addendum)
NAME:  [name], MRN:  [id number], DOB:  [birth year], LOS: 44 ADMISSION DATE:  10/14/2021, CONSULTATION DATE: 10/09/2021 REFERRING MD: EDP, CHIEF COMPLAINT: Subarachnoid hemorrhage  History of Present Illness:  46 yo female former smoker was found unresponsive by her husband and EMS called.  She was making gurgling noises with her breathing.  She was found to have SVT and EMS performed DCCV.  She had GCS 4 in ER and intubated for airway protection.  Found to have large SAH and neurosurgery arranged for hospital admission.  PCCM consulted to assist with management in ICU.  Pertinent Medical History:  Hypertension, uterine fibroids Legally blind in left eye  Significant Hospital Events: Including procedures, antibiotic start and stop dates in addition to other pertinent events   9/07 Presented unresponsive, hypotensive; CT head with subarachnoid hemorrhage 9/11 DDAVP give for Central DI, repeat head CT with worsening infarcts and herniation 9/12 Attempted transfer for second opinion at [facility] and [facility].  No beds at [facility] and transfer was declined at [facility] 9/13 No change in mental status.  Transfer to [facility] declined 9/14 [facility] declined transfer again.  No change.  DDAVP x 1 9/18 Palliative care consulted 9/23 Na increased >> restart DDAVP and increase free water 9/25 Plan for trach. Increased pressor requirement. Added midodrine  9/26 D/c D5W. Weaning NE as able. On I/O cath protocol  9/27 Worse renal fxn and new hyperkalemia  9/30 Renal function improving 10/5 Off levo 10/6 Plan for PEG tube 10/7 BPs elevated in AM, monitoring 10/17 PEG deferred in the setting of severe hypernatremia (173), DI. 10/18 No significant neurologic change. Na improved to 152, remains on DDAVP with FWF. Weaning NE as able, BP remains labile. Midodrine initiated. 10/19 No significant neurologic change. DDAVP discontinued. BP improved with addition of midodrine, weaning NE. Increased midodrine to '15mg'$   TID. 10/20 Cortrak clogged this morning, awaiting PEG placement. Na 125 (134), discontinued D5W and FWF. 10/22 Fever spiked, POD1 from PEG  11/1 Persistent anemia of chronic disease.  Patient's family agreed to transfusion today.  11/2 Hypotensive, hypothermic started on vanc/cefepime, low dose levo 11/3 Steroid dose increased, 5% 25g albumin given, off pressor 11/4 RUE picc removed, mid-line placed  11/8 Continuing to work on facility placement without success  11/10 Goc talk w husband and son  Interim History / Subjective:  Family at bedside, no acute issues overnight.    Objective:  Blood pressure 107/75, pulse 67, temperature (!) 97.2 F (36.2 C), temperature source Axillary, resp. rate 18, height '5\' 2"'$  (1.575 m), weight 74.2 kg, SpO2 97 %.    Vent Mode: AC FiO2 (%):  [40 %] 40 % Set Rate:  [18 bmp] 18 bmp Vt Set:  [400 mL] 400 mL PEEP:  [5 cmH20] 5 cmH20 Plateau Pressure:  [19 cmH20-21 cmH20] 20 cmH20   Intake/Output Summary (Last 24 hours) at 12/09/2021 1236 Last data filed at 12/09/2021 1000 Gross per 24 hour  Intake 1400 ml  Output 600 ml  Net 800 ml    Filed Weights   12/06/21 0500 12/07/21 0500 12/08/21 0432  Weight: 77 kg 77 kg 74.2 kg   Physical Exam:  General: adult female on vent via trach HEENT: Tongue protruding, wrapped in gauze Neuro: Pupils fixed, dilated, unreactive. Unresponsive. No pain response. Moves head, shoulders spontaneously CV: RRR, no MRG PULM: Clears after suctioning. Blood tinged sputum  GI: Normoactive. PEG Extremities: edematous Skin: grossly intact   Resolved Hospital Issues  Hypokalemia, resolved Aspiration pneumonia VAP  with sepsis -- stenotrophomonas maltophilia AKI  Assessment & Plan:    Subarachnoid hemorrhage with brain compression and herniation syndrome Acute respiratory failure Ventilator dependence s/p tracheostomy Tongue swelling Central DI due to [facility] Anemia critical illness Adrenal  insufficiency  Prognosis remains grim. Both palliative care and ethics have been involved this admission and thus far full scope of medical care has been pursued    -Continue full ventilator support. Volume control. Routine trach care.  -Hydrocortisone 20 mg twice daily -Maintain hemodynamic support -DDAVP -Tube feeds via PEG -Free water flushes continue  - Give 1 L NS bolus, appears dry on chemistry - Repeat BMP this afternoon.  - Consider lokelma if K doesn't improve with fluids.  - VTE ppx resume  Disposition -Pending placement skilled nursing facility   Best Practice (right click and "Reselect all SmartList Selections" daily)  Diet/type: tubefeeds and NPO w/ meds via tube  DVT prophylaxis: prophylactic heparin  GI prophylaxis: PPI Lines: N/A  Foley:  N/A Code Status: Full Code   Georgann Housekeeper, AGACNP-BC [place] for personal pager PCCM on call pager [phone] until 7pm. Please call Elink 7p-7a. [phone]  12/09/2021 12:36 PM

## 2021-12-09 NOTE — Progress Notes (Addendum)
eLink Physician-Brief Progress Note Patient Name: [name] DOB: October 27, 1975 MRN: [id number]   Date of Service  12/09/2021  HPI/Events of Note  BP 182/115, HR 74  eICU Interventions  Hydralazine dosing changed to 10-20 mg iv Q 2 hours PRN SBP > 160 and DBP > 100 mmHg.        [name] 12/09/2021, 4:43 AM

## 2021-12-09 NOTE — TOC Progression Note (Signed)
Transition of Care [facility]) - Progression Note    Patient Details  Name: BLAYNE GARLICK MRN: [id number] Date of Birth: [birth year]  Transition of Care [facility]) CM/SW [place], LCSW Phone Number: 12/09/2021, 9:50 AM  Clinical Narrative:    Per Santiago Glad at [facility] she is checking to see if they can make any room changes and will call CSW back.    Expected Discharge Plan: Skilled Nursing Facility Barriers to Discharge: Ship broker, SNF Pending bed offer  Expected Discharge Plan and Services Expected Discharge Plan: [place] In-house Referral: Clinical Social Work, Hospice / Palliative Care Discharge Planning Services: CM Consult Post Acute Care Choice: [place] Living arrangements for the past 2 months: Single Family Home                                       Social Determinants of Health (SDOH) Interventions    Readmission Risk Interventions     No data to display

## 2021-12-09 NOTE — Progress Notes (Signed)
Attending:    Subjective: Remains mechanically ventilated Increased respiratory secretions  Objective: Vitals:   12/09/21 1000 12/09/21 1114 12/09/21 1154 12/09/21 1200  BP: (!) 140/97 107/75  122/81  Pulse: 67 67  70  Resp: '18 18  18  '$ Temp:   (!) 97.2 F (36.2 C)   TempSrc:   Axillary   SpO2: 97% 97%  98%  Weight:      Height:       Vent Mode: AC FiO2 (%):  [40 %] 40 % Set Rate:  [18 bmp] 18 bmp Vt Set:  [400 mL] 400 mL PEEP:  [5 cmH20] 5 cmH20 Plateau Pressure:  [19 cmH20-21 cmH20] 20 cmH20  Intake/Output Summary (Last 24 hours) at 12/09/2021 1523 Last data filed at 12/09/2021 1200 Gross per 24 hour  Intake 2100 ml  Output 600 ml  Net 1500 ml    General:  In bed on vent HENT: NCAT ETT in place tongue swelling in place PULM: CTA B, vent supported breathing CV: RRR, no mgr GI: BS+, soft, nontender MSK: normal bulk and tone Neuro: moves shoulders, head when I examine her, no eye opening or purposeful movements   CBC    Component Value Date/Time   WBC 5.8 12/05/2021 0808   RBC 3.13 (L) 12/05/2021 0808   HGB 9.7 (L) 12/05/2021 0808   HCT 33.2 (L) 12/05/2021 0808   PLT 180 12/05/2021 0808   MCV 106.1 (H) 12/05/2021 0808   MCH 31.0 12/05/2021 0808   MCHC 29.2 (L) 12/05/2021 0808   RDW 23.0 (H) 12/05/2021 0808   LYMPHSABS 2.5 12/05/2021 0808   MONOABS 0.5 12/05/2021 0808   EOSABS 0.1 12/05/2021 0808   BASOSABS 0.1 12/05/2021 0808    BMET    Component Value Date/Time   NA 152 (H) 12/09/2021 0415   K 5.3 (H) 12/09/2021 0415   CL 125 (H) 12/09/2021 0415   CO2 21 (L) 12/09/2021 0415   GLUCOSE 78 12/09/2021 0415   BUN 41 (H) 12/09/2021 0415   CREATININE 1.19 (H) 12/09/2021 0415   CALCIUM 8.5 (L) 12/09/2021 0415   GFRNONAA 57 (L) 12/09/2021 0415   GFRAA >60 01/12/2017 0732      Impression Severe brain injury after subarachnoid hemorrhage Acute respiratory failure with hypoxemia Continue full ventilator support Central DI Anemia of critical  illness Increased respiratory secretions Mild AKI today, oliguria Hyperkaleami  Plan: Bolus 1 liter saline, monitor UOP BMET later today Monitor K Hydrocortisone to continue  ddavp to continue Free water flushes to continue Tube feeding to continue  Prognosis dismal  Introduced myself to her son today, he did not make eye contact or engage in conversation with me.  Will discuss with the rest of the ICU team and assess how we can be helpful to help engage with the family.  Multiple care teams believe that her chances of a meaningful neurologic recovery are zero.  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    [name], MD [place] PCCM Pager: [phone] Cell: [phone] After 7pm: [phone]

## 2021-12-10 DIAGNOSIS — I609 Nontraumatic subarachnoid hemorrhage, unspecified: Secondary | ICD-10-CM | POA: Diagnosis not present

## 2021-12-10 LAB — BASIC METABOLIC PANEL
Anion gap: 7 (ref 5–15)
BUN: 39 mg/dL — ABNORMAL HIGH (ref 6–20)
CO2: 19 mmol/L — ABNORMAL LOW (ref 22–32)
Calcium: 7.9 mg/dL — ABNORMAL LOW (ref 8.9–10.3)
Chloride: 124 mmol/L — ABNORMAL HIGH (ref 98–111)
Creatinine, Ser: 1.14 mg/dL — ABNORMAL HIGH (ref 0.44–1.00)
GFR, Estimated: 60 mL/min — ABNORMAL LOW (ref >60)
Glucose, Bld: 110 mg/dL — ABNORMAL HIGH (ref 70–99)
Potassium: 4.9 mmol/L (ref 3.5–5.1)
Sodium: 150 mmol/L — ABNORMAL HIGH (ref 135–145)

## 2021-12-10 LAB — CBC
HCT: 33.2 % — ABNORMAL LOW (ref 36.0–46.0)
Hemoglobin: 9.4 g/dL — ABNORMAL LOW (ref 12.0–15.0)
MCH: 30.6 pg (ref 26.0–34.0)
MCHC: 28.3 g/dL — ABNORMAL LOW (ref 30.0–36.0)
MCV: 108.1 fL — ABNORMAL HIGH (ref 80.0–100.0)
Platelets: 178 10*3/uL (ref 150–400)
RBC: 3.07 MIL/uL — ABNORMAL LOW (ref 3.87–5.11)
RDW: 22.1 % — ABNORMAL HIGH (ref 11.5–15.5)
WBC: 8.6 10*3/uL (ref 4.0–10.5)
nRBC: 0.4 % — ABNORMAL HIGH (ref 0.0–0.2)

## 2021-12-10 MED ORDER — FREE WATER
200.0000 mL | Status: DC
  Administered 2021-12-10 – 2021-12-11 (×11): 200 mL

## 2021-12-10 NOTE — Progress Notes (Signed)
Attending:    Subjective: No acute events Secretions are more thick than before  Objective: Vitals:   12/10/21 0900 12/10/21 0907 12/10/21 1000 12/10/21 1130  BP: (!) 144/92  127/85 133/86  Pulse: 72 72 69 69  Resp: '18 18 18 18  '$ Temp:      TempSrc:      SpO2: 98% 98% 98% 99%  Weight:      Height:       Vent Mode: AC FiO2 (%):  [40 %] 40 % Set Rate:  [18 bmp] 18 bmp Vt Set:  [400 mL] 400 mL PEEP:  [5 cmH20] 5 cmH20 Plateau Pressure:  [19 cmH20-20 cmH20] 20 cmH20  Intake/Output Summary (Last 24 hours) at 12/10/2021 1311 Last data filed at 12/10/2021 0800 Gross per 24 hour  Intake 2910.83 ml  Output 410 ml  Net 2500.83 ml    General:  In bed on vent HENT: NCAT massive tongue swelling, tracheostomy in place PULM: CTA B, vent supported breathing CV: RRR, no mgr GI: BS+, soft, nontender MSK: normal bulk and tone Neuro: GCS 3 on my exam    CBC    Component Value Date/Time   WBC 8.6 12/10/2021 0556   RBC 3.07 (L) 12/10/2021 0556   HGB 9.4 (L) 12/10/2021 0556   HCT 33.2 (L) 12/10/2021 0556   PLT 178 12/10/2021 0556   MCV 108.1 (H) 12/10/2021 0556   MCH 30.6 12/10/2021 0556   MCHC 28.3 (L) 12/10/2021 0556   RDW 22.1 (H) 12/10/2021 0556   LYMPHSABS 2.5 12/05/2021 0808   MONOABS 0.5 12/05/2021 0808   EOSABS 0.1 12/05/2021 0808   BASOSABS 0.1 12/05/2021 0808    BMET    Component Value Date/Time   NA 150 (H) 12/10/2021 0556   K 4.9 12/10/2021 0556   CL 124 (H) 12/10/2021 0556   CO2 19 (L) 12/10/2021 0556   GLUCOSE 110 (H) 12/10/2021 0556   BUN 39 (H) 12/10/2021 0556   CREATININE 1.14 (H) 12/10/2021 0556   CALCIUM 7.9 (L) 12/10/2021 0556   GFRNONAA 60 (L) 12/10/2021 0556   GFRAA >60 01/12/2017 0732      Impression Severe SAH Severe brain injury  Central DI Anemia of critical illness Chronic chest 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

## 2021-12-10 NOTE — Progress Notes (Addendum)
NAME:  [name], MRN:  [id number], DOB:  [birth year], LOS: 66 ADMISSION DATE:  10/01/2021, CONSULTATION DATE: 10/15/2021 REFERRING MD: EDP, CHIEF COMPLAINT: Subarachnoid hemorrhage  History of Present Illness:  46 yo female former smoker was found unresponsive by her husband and EMS called.  She was making gurgling noises with her breathing.  She was found to have SVT and EMS performed DCCV.  She had GCS 4 in ER and intubated for airway protection.  Found to have large SAH and neurosurgery arranged for hospital admission.  PCCM consulted to assist with management in ICU.  Pertinent Medical History:  Hypertension, uterine fibroids Legally blind in left eye  Significant Hospital Events: Including procedures, antibiotic start and stop dates in addition to other pertinent events   9/07 Presented unresponsive, hypotensive; CT head with subarachnoid hemorrhage 9/11 DDAVP give for Central DI, repeat head CT with worsening infarcts and herniation 9/12 Attempted transfer for second opinion at [facility] and [facility].  No beds at [facility] and transfer was declined at [facility] 9/13 No change in mental status.  Transfer to [facility] declined 9/14 [facility] declined transfer again.  No change.  DDAVP x 1 9/18 Palliative care consulted 9/23 Na increased >> restart DDAVP and increase free water 9/25 Plan for trach. Increased pressor requirement. Added midodrine  9/26 D/c D5W. Weaning NE as able. On I/O cath protocol  9/27 Worse renal fxn and new hyperkalemia  9/30 Renal function improving 10/5 Off levo 10/6 Plan for PEG tube 10/7 BPs elevated in AM, monitoring 10/17 PEG deferred in the setting of severe hypernatremia (173), DI. 10/18 No significant neurologic change. Na improved to 152, remains on DDAVP with FWF. Weaning NE as able, BP remains labile. Midodrine initiated. 10/19 No significant neurologic change. DDAVP discontinued. BP improved with addition of midodrine, weaning NE. Increased midodrine to '15mg'$   TID. 10/20 Cortrak clogged this morning, awaiting PEG placement. Na 125 (134), discontinued D5W and FWF. 10/22 Fever spiked, POD1 from PEG  11/1 Persistent anemia of chronic disease.  Patient's family agreed to transfusion today.  11/2 Hypotensive, hypothermic started on vanc/cefepime, low dose levo 11/3 Steroid dose increased, 5% 25g albumin given, off pressor 11/4 RUE picc removed, mid-line placed  11/8 Continuing to work on facility placement without success  11/10 Goc talk w husband and son 11/15 No significant change in neurologic exam. CBC stable, Na 150 (downtrending). Remainder of labs stable.  Interim History / Subjective:  No significant events overnight Neurologic exam grossly unchanged Stable hemodynamics, remains off of pressors Labs stable D/w husband at bedside  Objective:  Blood pressure (!) 149/94, pulse 76, temperature 98.8 F (37.1 C), temperature source Oral, resp. rate 18, height '5\' 2"'$  (1.575 m), weight 79.5 kg, SpO2 98 %.    Vent Mode: AC FiO2 (%):  [40 %] 40 % Set Rate:  [18 bmp] 18 bmp Vt Set:  [400 mL] 400 mL PEEP:  [5 cmH20] 5 cmH20 Plateau Pressure:  [19 cmH20-21 cmH20] 20 cmH20   Intake/Output Summary (Last 24 hours) at 12/10/2021 0800 Last data filed at 12/10/2021 0700 Gross per 24 hour  Intake 2985.78 ml  Output 410 ml  Net 2575.78 ml    Filed Weights   12/07/21 0500 12/08/21 0432 12/10/21 0300  Weight: 77 kg 74.2 kg 79.5 kg   Physical Examination: General: Chronically ill-appearing middle-aged woman in NAD. HEENT: [place]/AT, anicteric sclera, pupils 52m bilaterally, nonreactive, moist mucous membranes. Tongue edema, protruding with moistened gauze dressing in place. Neck: Trach midline without  surrounding edema/erythema, thickened secretions in in-line suction. Neuro: Comatose. Does not respond to verbal, tactile or noxious stimuli. Triple flexion to pain in BLE, no movement in upper extremities. Not following commands. No spontaneous movement  of extremities noted. No cough/gag/corneals. CV: RRR, no m/g/r. PULM: Breathing even and unlabored on vent (PEEP 5, FiO2 40%). Lung fields CTAB in upper fields, diminished at bilateral bases. GI: Soft, nontender, mildly distended. Normoactive bowel sounds. Extremities: Bilateral symmetric 1+ LE edema noted, 1-2+ edema of BUE, dependent. Skin: Warm/dry, no rashes.  Resolved Hospital Issues  Hypokalemia, resolved Aspiration pneumonia VAP with sepsis -- stenotrophomonas maltophilia AKI  Assessment & Plan:   Subarachnoid hemorrhage with brain compression and herniation syndrome Acute respiratory failure Ventilator dependence s/p tracheostomy Tongue swelling Central DI due to [facility] Anemia critical illness Adrenal insufficiency  Prognosis remains grim. Both palliative care and ethics have been involved this admission and thus far full scope of medical care has been pursued.  - Continue full vent support (4-8cc/kg IBW), volume control - Wean FiO2 for O2 sat > 90% - Trach care per protocol - VAP bundle - Pulmonary hygiene - Continue hydrocortisone '20mg'$  BID - Continue hemodynamic support - DDAVP/FWF as indicated for hypernatremia - Continue TF via PEG - Trend CBC, BMP  Disposition: - Pending SNF placement; patient's husband updated at bedside today and via phone, states family plans to have further [place] discussions this upcoming weekend regarding Mizani's clinical status and long-term options.  Best Practice: (right click and "Reselect all SmartList Selections" daily)  Diet/type: tubefeeds and NPO w/ meds via tube  DVT prophylaxis: prophylactic heparin  GI prophylaxis: PPI Lines: N/A  Foley:  N/A Code Status: Full Code  Signature:   Lestine Mount, PA-C [place] [facility] 12/10/21 8:04 AM  Please see [email or web address] for pager details.  From 7A-7P if no response, please call [phone] After hours, please call ELink [phone]

## 2021-12-10 NOTE — Progress Notes (Signed)
Patient ID: [name], female   DOB: [birth year], 46 y.o.   MRN: [id number] BP (!) 173/106 (BP Location: Right Arm)   Pulse 71   Temp (!) 96.5 F (35.8 C) (Axillary) Comment: Reported to the nurse, bairhugger applied  Resp 18   Ht '5\' 2"'$  (1.575 m)   Wt 79.5 kg   SpO2 99%   BMI 32.06 kg/m  Comatose. No cough, no gag Pupils not reactive to light No corneal reflexes No oculocephalic reflexes

## 2021-12-10 NOTE — TOC Progression Note (Signed)
Transition of Care [facility]) - Progression Note    Patient Details  Name: [name] MRN: [id number] Date of Birth: [birth year]  Transition of Care [facility]) CM/SW [place], LCSW Phone Number: 12/10/2021, 4:56 PM  Clinical Narrative:    Still awaiting bed at [facility].    Expected Discharge Plan: [place] Barriers to Discharge: Insurance Authorization, SNF Pending bed offer  Expected Discharge Plan and Services Expected Discharge Plan: [place] In-house Referral: Clinical Social Work, Hospice / Palliative Care Discharge Planning Services: CM Consult Post Acute Care Choice: [place] arrangements for the past 2 months: Single Family Home                                       Social Determinants of Health (SDOH) Interventions    Readmission Risk Interventions     No data to display

## 2021-12-10 NOTE — Progress Notes (Signed)
[facility] PCCM  I spoke to her husband Carolin Guernsey earlier today.  He was thankful for the care his wife has received. He explained to me that the family plans to have a meeting this weekend when family arrives for Thanksgiving week. He indicated that many in the family are feeling that they need to "take a different direction" because she isn't getting better despite all the care she has received.  Offered support and asked if there was anything that I could do to support that meeting.  He said that he felt that they were up to speed on her condition and the family needed time to meet together to discuss her disposition.  [name], MD [place] PCCM Pager: [phone] Cell: [phone] After 7:00 pm call Elink  [phone]

## 2021-12-11 DIAGNOSIS — I609 Nontraumatic subarachnoid hemorrhage, unspecified: Secondary | ICD-10-CM | POA: Diagnosis not present

## 2021-12-11 LAB — BASIC METABOLIC PANEL
Anion gap: 6 (ref 5–15)
BUN: 32 mg/dL — ABNORMAL HIGH (ref 6–20)
CO2: 18 mmol/L — ABNORMAL LOW (ref 22–32)
Calcium: 7.9 mg/dL — ABNORMAL LOW (ref 8.9–10.3)
Chloride: 118 mmol/L — ABNORMAL HIGH (ref 98–111)
Creatinine, Ser: 0.9 mg/dL (ref 0.44–1.00)
GFR, Estimated: >60 mL/min (ref >60)
Glucose, Bld: 109 mg/dL — ABNORMAL HIGH (ref 70–99)
Potassium: 4.6 mmol/L (ref 3.5–5.1)
Sodium: 142 mmol/L (ref 135–145)

## 2021-12-11 LAB — CBC
HCT: 33.6 % — ABNORMAL LOW (ref 36.0–46.0)
Hemoglobin: 10.1 g/dL — ABNORMAL LOW (ref 12.0–15.0)
MCH: 31.3 pg (ref 26.0–34.0)
MCHC: 30.1 g/dL (ref 30.0–36.0)
MCV: 104 fL — ABNORMAL HIGH (ref 80.0–100.0)
Platelets: 171 10*3/uL (ref 150–400)
RBC: 3.23 MIL/uL — ABNORMAL LOW (ref 3.87–5.11)
RDW: 22.1 % — ABNORMAL HIGH (ref 11.5–15.5)
WBC: 7.3 10*3/uL (ref 4.0–10.5)
nRBC: 0.4 % — ABNORMAL HIGH (ref 0.0–0.2)

## 2021-12-11 LAB — MAGNESIUM: Magnesium: 2.8 mg/dL — ABNORMAL HIGH (ref 1.7–2.4)

## 2021-12-11 MED ORDER — HYDRALAZINE HCL 20 MG/ML IJ SOLN
10.0000 mg | INTRAMUSCULAR | Status: DC | PRN
  Administered 2021-12-11 – 2021-12-24 (×34): 10 mg via INTRAVENOUS
  Filled 2021-12-11 (×35): qty 1

## 2021-12-11 MED ORDER — FREE WATER
100.0000 mL | Status: DC
  Administered 2021-12-12 – 2021-12-13 (×8): 100 mL

## 2021-12-11 NOTE — TOC Progression Note (Signed)
Transition of Care [facility]) - Progression Note    Patient Details  Name: [name] MRN: [id number] Date of Birth: February 10, 1975  Transition of Care [facility]) CM/SW [place], LCSW Phone Number: 12/11/2021, 9:36 AM  Clinical Narrative:    CSW left voicemail for admissions at [facility].    Expected Discharge Plan: [place] Barriers to Discharge: Insurance Authorization, SNF Pending bed offer  Expected Discharge Plan and Services Expected Discharge Plan: [place] In-house Referral: Clinical Social Work, Hospice / Palliative Care Discharge Planning Services: CM Consult Post Acute Care Choice: [place] arrangements for the past 2 months: Single Family Home                                       Social Determinants of Health (SDOH) Interventions    Readmission Risk Interventions     No data to display

## 2021-12-11 NOTE — Progress Notes (Signed)
Ventilator changed out for routine maintenance on machine being due.

## 2021-12-11 NOTE — Progress Notes (Signed)
eLink Physician-Brief Progress Note Patient Name: [name] DOB: [birth year] MRN: [id number]   Date of Service  12/11/2021  HPI/Events of Note  Hypertension - BP = 178/113.   eICU Interventions  Plan: Hydralazine 10 mg IV Q 4 hours PRN SBP > 160 or DBP > 100.     Intervention Category Major Interventions: Hypertension - evaluation and management  Dudley Cooley [place] 12/11/2021, 3:33 AM

## 2021-12-11 NOTE — Progress Notes (Signed)
NAME:  [name], MRN:  [id number], DOB:  Nov 07, 1975, LOS: 47 ADMISSION DATE:  10/21/2021, CONSULTATION DATE: 10/19/2021 REFERRING MD: EDP, CHIEF COMPLAINT: Subarachnoid hemorrhage  History of Present Illness:  46 yo female former smoker was found unresponsive by her husband and EMS called.  She was making gurgling noises with her breathing.  She was found to have SVT and EMS performed DCCV.  She had GCS 4 in ER and intubated for airway protection.  Found to have large SAH and neurosurgery arranged for hospital admission.  PCCM consulted to assist with management in ICU.  Pertinent Medical History:  Hypertension, uterine fibroids Legally blind in left eye  Significant Hospital Events: Including procedures, antibiotic start and stop dates in addition to other pertinent events   9/07 Presented unresponsive, hypotensive; CT head with subarachnoid hemorrhage 9/11 DDAVP give for Central DI, repeat head CT with worsening infarcts and herniation 9/12 Attempted transfer for second opinion at [facility] and [facility].  No beds at [facility] and transfer was declined at [facility] 9/13 No change in mental status.  Transfer to [facility] declined 9/14 [facility] declined transfer again.  No change.  DDAVP x 1 9/18 Palliative care consulted 9/23 Na increased >> restart DDAVP and increase free water 9/25 Plan for trach. Increased pressor requirement. Added midodrine  9/26 D/c D5W. Weaning NE as able. On I/O cath protocol  9/27 Worse renal fxn and new hyperkalemia  9/30 Renal function improving 10/5 Off levo 10/6 Plan for PEG tube 10/7 BPs elevated in AM, monitoring 10/17 PEG deferred in the setting of severe hypernatremia (173), DI. 10/18 No significant neurologic change. Na improved to 152, remains on DDAVP with FWF. Weaning NE as able, BP remains labile. Midodrine initiated. 10/19 No significant neurologic change. DDAVP discontinued. BP improved with addition of midodrine, weaning NE. Increased midodrine to '15mg'$   TID. 10/20 Cortrak clogged this morning, awaiting PEG placement. Na 125 (134), discontinued D5W and FWF. 10/22 Fever spiked, POD1 from PEG  11/1 Persistent anemia of chronic disease.  Patient's family agreed to transfusion today.  11/2 Hypotensive, hypothermic started on vanc/cefepime, low dose levo 11/3 Steroid dose increased, 5% 25g albumin given, off pressor 11/4 RUE picc removed, mid-line placed  11/8 Continuing to work on facility placement without success  11/10 Goc talk w husband and son 11/15 No significant change in neurologic exam. CBC stable, Na 150 (downtrending). Remainder of labs stable. 11/16 No change in neuro exam. Na 142 (150). Hydralazine PRN overnight for elevated BP.  Interim History / Subjective:  No significant events overnight SBP elevated to 170s requiring PRN Hydralazine Na 142 (150) Husband at bedside  Objective:  Blood pressure 134/88, pulse 71, temperature (!) 97.1 F (36.2 C), temperature source Axillary, resp. rate 18, height '5\' 2"'$  (1.575 m), weight 79.5 kg, SpO2 99 %.    Vent Mode: AC FiO2 (%):  [40 %] 40 % Set Rate:  [18 bmp] 18 bmp Vt Set:  [400 mL] 400 mL PEEP:  [5 cmH20] 5 cmH20 Plateau Pressure:  [10 cmH20-20 cmH20] 16 cmH20   Intake/Output Summary (Last 24 hours) at 12/11/2021 0737 Last data filed at 12/11/2021 0700 Gross per 24 hour  Intake 4954.75 ml  Output 1550 ml  Net 3404.75 ml    Filed Weights   12/08/21 0432 12/10/21 0300 12/11/21 0442  Weight: 74.2 kg 79.5 kg 79.5 kg   Physical Examination: General: Acutely ill-appearing middle-aged woman in NAD. Resting comfortably in bed. HEENT: /AT, anicteric sclera, pupils 54m and nonreactive,  moist mucous membranes, tongue edematous and protruding with moistened gauze dressing in place. Neuro: Comatose. No responsive to verbal, tactile or noxious stimuli. Triple flexion to pain in BLE, no response to pain in upper extremities. Not following commands. No corneal, cough or gag. CV: RRR,  no m/g/r. PULM: Breathing even and unlabored on vent (PEEP 5, FiO2 40% volume control). Lung fields CTAB in upper fields, diminished at bases. GI: Soft, nontender, nondistended. Normoactive bowel sounds. Extremities: Bilateral symmetric 1+ LE edema noted, 2+ BUE dependent edema. Skin: Warm/dry, no rashes.  Resolved Hospital Issues  Hypokalemia, resolved Aspiration pneumonia VAP with sepsis -- stenotrophomonas maltophilia AKI  Assessment & Plan:   Subarachnoid hemorrhage with brain compression and herniation syndrome Acute respiratory failure Ventilator dependence s/p tracheostomy Tongue swelling Central DI due to [facility] Anemia critical illness Adrenal insufficiency  Prognosis remains grim. Both palliative care and ethics have been involved this admission and thus far full scope of medical care has been pursued.  - Continue full vent support (4-8cc/kg IBW), tolerating volume control - Wean FiO2 for O2 sat > 90% - Trach care per protocol - VAP bundle - Pulmonary hygiene - Remains off sedation - Continue hydrocortisone '20mg'$  BID - Continue hemodynamic support/optimization - Na downtrending, reduce DDAVP/FWF - Continue TF via PEG - Trend CBC, BMP (Na)  Disposition: - Awaiting SNF placement - Patient's husband updated at bedside today 11/16 - Per husband, family planning update/further Gideon discussions this upcoming weekend regarding Farheen's clinical status/long-term care options  Best Practice: (right click and "Reselect all SmartList Selections" daily)  Diet/type: tubefeeds and NPO w/ meds via tube  DVT prophylaxis: prophylactic heparin  GI prophylaxis: PPI Lines: N/A  Foley:  N/A Code Status: Full Code  Signature:   Lestine Mount, PA-C [place] [facility] 12/11/21 7:37 AM  Please see [email or web address] for pager details.  From 7A-7P if no response, please call [phone] After hours, please call ELink [phone]

## 2021-12-11 NOTE — Progress Notes (Signed)
Patient ID: [name], female   DOB: 23-Aug-1975, 46 y.o.   MRN: [id number] BP (!) 164/104   Pulse 78   Temp 98.4 F (36.9 C) (Axillary)   Resp 18   Ht '5\' 2"'$  (1.575 m)   Wt 79.5 kg   SpO2 98%   BMI 32.06 kg/m  Comatose. Awaiting bed availability.  No neurological changes noted.

## 2021-12-12 ENCOUNTER — Inpatient Hospital Stay (HOSPITAL_COMMUNITY): Payer: Medicare Other

## 2021-12-12 DIAGNOSIS — I609 Nontraumatic subarachnoid hemorrhage, unspecified: Secondary | ICD-10-CM | POA: Diagnosis not present

## 2021-12-12 LAB — BASIC METABOLIC PANEL
Anion gap: 10 (ref 5–15)
BUN: 33 mg/dL — ABNORMAL HIGH (ref 6–20)
CO2: 18 mmol/L — ABNORMAL LOW (ref 22–32)
Calcium: 8.3 mg/dL — ABNORMAL LOW (ref 8.9–10.3)
Chloride: 119 mmol/L — ABNORMAL HIGH (ref 98–111)
Creatinine, Ser: 1.01 mg/dL — ABNORMAL HIGH (ref 0.44–1.00)
GFR, Estimated: >60 mL/min (ref >60)
Glucose, Bld: 100 mg/dL — ABNORMAL HIGH (ref 70–99)
Potassium: 5.3 mmol/L — ABNORMAL HIGH (ref 3.5–5.1)
Sodium: 147 mmol/L — ABNORMAL HIGH (ref 135–145)

## 2021-12-12 MED ORDER — MIDAZOLAM HCL 2 MG/2ML IJ SOLN: 2.0000 mg | Freq: Once | INTRAMUSCULAR | Status: AC

## 2021-12-12 MED ORDER — MIDAZOLAM HCL 2 MG/2ML IJ SOLN
INTRAMUSCULAR | Status: AC
  Administered 2021-12-12: 2 mg via INTRAVENOUS
  Filled 2021-12-12: qty 2

## 2021-12-12 NOTE — Progress Notes (Addendum)
NAME:  [name], MRN:  [id number], DOB:  03-18-1975, LOS: 19 ADMISSION DATE:  10/24/2021, CONSULTATION DATE: 10/12/2021 REFERRING MD: EDP, CHIEF COMPLAINT: Subarachnoid hemorrhage  History of Present Illness:  46 yo female former smoker was found unresponsive by her husband and EMS called.  She was making gurgling noises with her breathing.  She was found to have SVT and EMS performed DCCV.  She had GCS 4 in ER and intubated for airway protection.  Found to have large SAH and neurosurgery arranged for hospital admission.  PCCM consulted to assist with management in ICU.  Pertinent Medical History:  Hypertension, uterine fibroids Legally blind in left eye  Significant Hospital Events: Including procedures, antibiotic start and stop dates in addition to other pertinent events   9/07 Presented unresponsive, hypotensive; CT head with subarachnoid hemorrhage 9/11 DDAVP give for Central DI, repeat head CT with worsening infarcts and herniation 9/12 Attempted transfer for second opinion at [facility] and [facility].  No beds at [facility] and transfer was declined at [facility] 9/13 No change in mental status.  Transfer to [facility] declined 9/14 [facility] declined transfer again.  No change.  DDAVP x 1 9/18 Palliative care consulted 9/23 Na increased >> restart DDAVP and increase free water 9/25 Plan for trach. Increased pressor requirement. Added midodrine  9/26 D/c D5W. Weaning NE as able. On I/O cath protocol  9/27 Worse renal fxn and new hyperkalemia  9/30 Renal function improving 10/5 Off levo 10/6 Plan for PEG tube 10/7 BPs elevated in AM, monitoring 10/17 PEG deferred in the setting of severe hypernatremia (173), DI. 10/18 No significant neurologic change. Na improved to 152, remains on DDAVP with FWF. Weaning NE as able, BP remains labile. Midodrine initiated. 10/19 No significant neurologic change. DDAVP discontinued. BP improved with addition of midodrine, weaning NE. Increased midodrine to '15mg'$   TID. 10/20 Cortrak clogged this morning, awaiting PEG placement. Na 125 (134), discontinued D5W and FWF. 10/22 Fever spiked, POD1 from PEG  11/1 Persistent anemia of chronic disease.  Patient's family agreed to transfusion today.  11/2 Hypotensive, hypothermic started on vanc/cefepime, low dose levo 11/3 Steroid dose increased, 5% 25g albumin given, off pressor 11/4 RUE picc removed, mid-line placed  11/8 Continuing to work on facility placement without success  11/10 Goc talk w husband and son 11/15 No significant change in neurologic exam. CBC stable, Na 150 (downtrending). Remainder of labs stable. 11/16 No change in neuro exam. Na 142 (150). Hydralazine PRN overnight for elevated BP. 11/17 Unchanged clinical exam. Na 147 (142). BP better controlled. Trach change today.  Interim History / Subjective:  No significant events overnight Grossly unchanged neurologic exam, remains comatose Na 147 (142), FWF decreased 11/16 Continue DDAVP Trach change today with PCCM/respiratory CM continues to seek placement  Objective:  Blood pressure (!) 143/92, pulse 71, temperature (!) 97.5 F (36.4 C), temperature source Oral, resp. rate 18, height '5\' 2"'$  (1.575 m), weight 80 kg, SpO2 97 %.    Vent Mode: AC FiO2 (%):  [40 %] 40 % Set Rate:  [18 bmp] 18 bmp Vt Set:  [400 mL] 400 mL PEEP:  [5 cmH20] 5 cmH20 Plateau Pressure:  [17 cmH20-20 cmH20] 17 cmH20   Intake/Output Summary (Last 24 hours) at 12/12/2021 0729 Last data filed at 12/12/2021 0600 Gross per 24 hour  Intake 3470.7 ml  Output 1800 ml  Net 1670.7 ml    Filed Weights   12/10/21 0300 12/11/21 0442 12/12/21 0500  Weight: 79.5 kg 79.5 kg  80 kg   Physical Examination: General: Chronically ill-appearing middle-aged woman in NAD. Unresponsive. HEENT: [place]/AT, anicteric sclera, pupils equal round 16m nonreactive, moist mucous membranes. Tongue edematous and protruding with moistened gauze dressing in place. Neck: Tracheostomy midline  without surrounding erythema/edema, moderately thick yellow secretions noted in suction tubing. Neuro: Comatose. Does not respond to verbal, tactile or noxious stimuli. Triple flexion with pain in BLE, no response to pain in BUE. Not following commands. No spontaneous movement of extremities noted. No corneal/cough/gag.  CV: RRR, no m/g/r. PULM: Breathing even and unlabored on vent (volume control, PEEP 5, FiO2 40%). Lung fields CTAB in upper fields. GI: Soft, nontender, mildly distended. Hypoactive bowel sounds. Extremities: Trace symmetric BLE edema noted. Bilateral 1-2+ UE edema noted, symmetric/dependent. Skin: Warm/dry, no rashes.  Resolved Hospital Issues  Hypokalemia, resolved Aspiration pneumonia VAP with sepsis -- stenotrophomonas maltophilia AKI  Assessment & Plan:   Subarachnoid hemorrhage with brain compression and herniation syndrome Acute respiratory failure Ventilator dependence s/p tracheostomy Tongue swelling [facility] Adrenal insufficiency  Prognosis remains grim. Both palliative care and ethics have been involved this admission and thus far full scope of medical care has been pursued.  - Continue full vent support (4-8cc/kg IBW), tolerating volume control - Wean FiO2 for O2 sat > 90% - Trach care per protocol; plan to change trach today 11/17 with PCCM/RRT present - VAP bundle - Pulmonary hygiene - Remains off sedation - Continue hydrocortisone '20mg'$  BID - Hemodynamic optimization as able - Na stabilizing, FWF reduced 11/16 - Continue DDAVP - Continue TF via PEG - Trend BMP/Na  Disposition: - Awaiting SNF placement - Patient's husband updated at bedside 11/17 - Per husband, family planning to have additional private GSt. Michaelsdiscussions this upcoming weekend regarding [name]'s clinical status/long term care options  Best Practice: (right click and "Reselect all SmartList Selections" daily)  Diet/type: tubefeeds and NPO w/  meds via tube  DVT prophylaxis: prophylactic heparin  GI prophylaxis: PPI Lines: N/A  Foley:  N/A Code Status: Full Code  Signature:   SLestine Mount PA-C [place] [facility] 12/12/21 7:29 AM  Please see [email or web address] for pager details.  From 7A-7P if no response, please call [phone] After hours, please call ELink [phone]

## 2021-12-12 NOTE — Procedures (Addendum)
Tracheostomy Exchange Procedure Note  ZYAIR RHEIN  [id number]  1975/09/26  Date:12/12/21  Time:3:23 PM   Provider Performing:Brent Merton Wadlow   Procedure: Tracheostomy Exchange Through Immature Stoma [phone])  Indication(s) Chronic respiratory failure with hypoxemia  Consent Risks of the procedure as well as the alternatives and risks of each were explained to the patient and/or caregiver.  Consent for the procedure was obtained and is signed in the bedside chart see bronchoscopy note  Anesthesia Versed 33m   Time Out Verified patient identification, verified procedure, site/side was marked, verified correct patient position, special equipment/implants available, medications/allergies/relevant history reviewed, required imaging and test results available.   Sterile Technique Hand hygiene, gloves   Procedure Description We passed a suction catheter into the existing tracheostomy tube and met unexpected resistance so a bronchoscopy was performed, see separate note.  Then the suction catheter was repositioned and a size 6 cuffed existing Shiley removed and size 6 cuffed Shiley placed through stoma.   Complications/Tolerance None; patient tolerated the procedure well..   EBL Minimal  BRoselie Awkward MD L[place]stonPCCM Pager: [phone]: [phone] 7:00 pm call Elink  [phone]

## 2021-12-12 NOTE — Procedures (Signed)
Bronchoscopy Procedure Note  [name]  [id number]  [birth year]  Date:12/12/21  Time:3:18 PM   Provider Performing:Brent Reola Buckles   Procedure(s):  Flexible Bronchoscopy [phone])  Indication(s) Inspection of airway and tracheostomy exchange  Consent Risks of the procedure as well as the alternatives and risks of each were explained to the patient and/or caregiver.  Consent for the procedure was obtained and is signed in the bedside chart Verbal consent from husband [name] and son who was in room.  This was obtained semi-emergently as there was unexpected resistance felt when a suction catheter was passed through the tracheostomy tube.  Anesthesia Versed 32m.   Time Out Verified patient identification, verified procedure, site/side was marked, verified correct patient position, special equipment/implants available, medications/allergies/relevant history reviewed, required imaging and test results available.   Sterile Technique Usual hand hygiene, masks, gowns, and gloves were used   Procedure Description Bronchoscope advanced through tracheostomy tube and into airway.  Airways were examined down to subsegmental level with findings noted below.   Following diagnostic evaluation, no procedure performed.  Findings: The tracheostomy tube was noted to be 1.5 cm from the carina, in the middle of the trachea.  Some inflammatory changes along the carina and in the right lower lobe from suction trauma noted. After tracheostomy exchange over a suction catheter (see separate noted), the bronchoscope was used to confirm placement.   Complications/Tolerance None; patient tolerated the procedure well. Chest X-ray is needed post procedure.   EBL Minimal   Specimen(s) None  Shawna Awkward MD [place] PCCM Pager: [phone]: [phone] 7:00 pm call Elink  [phone]

## 2021-12-12 NOTE — Progress Notes (Signed)
Patient ID: [name], female   DOB: 1975/09/26, 46 y.o.   MRN: [id number] BP (!) 162/101   Pulse 72   Temp 97.7 F (36.5 C) (Axillary)   Resp 18   Ht '5\' 2"'$  (1.575 m)   Wt 80 kg   SpO2 99%   BMI 32.26 kg/m  Comatose. No neurological changes Grim prognosis Waiting on placement.

## 2021-12-13 DIAGNOSIS — I609 Nontraumatic subarachnoid hemorrhage, unspecified: Secondary | ICD-10-CM | POA: Diagnosis not present

## 2021-12-13 LAB — BASIC METABOLIC PANEL
Anion gap: 6 (ref 5–15)
BUN: 32 mg/dL — ABNORMAL HIGH (ref 6–20)
CO2: 20 mmol/L — ABNORMAL LOW (ref 22–32)
Calcium: 8.3 mg/dL — ABNORMAL LOW (ref 8.9–10.3)
Chloride: 121 mmol/L — ABNORMAL HIGH (ref 98–111)
Creatinine, Ser: 1 mg/dL (ref 0.44–1.00)
GFR, Estimated: >60 mL/min (ref >60)
Glucose, Bld: 105 mg/dL — ABNORMAL HIGH (ref 70–99)
Potassium: 4.8 mmol/L (ref 3.5–5.1)
Sodium: 147 mmol/L — ABNORMAL HIGH (ref 135–145)

## 2021-12-13 MED ORDER — FREE WATER
100.0000 mL | Status: DC
  Administered 2021-12-13 – 2021-12-15 (×15): 100 mL

## 2021-12-13 MED ORDER — FAMOTIDINE 20 MG PO TABS
20.0000 mg | ORAL_TABLET | Freq: Two times a day (BID) | ORAL | Status: DC
  Administered 2021-12-13 – 2021-12-22 (×18): 20 mg
  Filled 2021-12-13 (×19): qty 1

## 2021-12-13 MED ORDER — SENNOSIDES 8.8 MG/5ML PO SYRP
5.0000 mL | ORAL_SOLUTION | Freq: Once | ORAL | Status: AC
  Administered 2021-12-13: 5 mL

## 2021-12-13 MED ORDER — SENNOSIDES-DOCUSATE SODIUM 8.6-50 MG PO TABS
1.0000 | ORAL_TABLET | Freq: Two times a day (BID) | ORAL | Status: DC
  Administered 2021-12-13 – 2021-12-31 (×32): 1
  Filled 2021-12-13 (×32): qty 1

## 2021-12-13 NOTE — Plan of Care (Signed)
Patient received on VC settings as charted. No changes made at this time. Lurline Idol are done without issue.

## 2021-12-13 NOTE — Progress Notes (Signed)
NAME:  [name], MRN:  [id number], DOB:  March 28, 1975, LOS: 63 ADMISSION DATE:  10/09/2021, CONSULTATION DATE: 10/10/2021 REFERRING MD: EDP, CHIEF COMPLAINT: Subarachnoid hemorrhage  History of Present Illness:  46 yo female former smoker was found unresponsive by her husband and EMS called.  She was making gurgling noises with her breathing.  She was found to have SVT and EMS performed DCCV.  She had GCS 4 in ER and intubated for airway protection.  Found to have large SAH and neurosurgery arranged for hospital admission.  PCCM consulted to assist with management in ICU.  Pertinent Medical History:  Hypertension, uterine fibroids Legally blind in left eye  Significant Hospital Events: Including procedures, antibiotic start and stop dates in addition to other pertinent events   9/07 Presented unresponsive, hypotensive; CT head with subarachnoid hemorrhage 9/11 DDAVP give for Central DI, repeat head CT with worsening infarcts and herniation 9/12 Attempted transfer for second opinion at [facility] and [facility].  No beds at [facility] and transfer was declined at [facility] 9/13 No change in mental status.  Transfer to [facility] declined 9/14 [facility] declined transfer again.  No change.  DDAVP x 1 9/18 Palliative care consulted 9/23 Na increased >> restart DDAVP and increase free water 9/25 Plan for trach. Increased pressor requirement. Added midodrine  9/26 D/c D5W. Weaning NE as able. On I/O cath protocol  9/27 Worse renal fxn and new hyperkalemia  9/30 Renal function improving 10/5 Off levo 10/6 Plan for PEG tube 10/7 BPs elevated in AM, monitoring 10/17 PEG deferred in the setting of severe hypernatremia (173), DI. 10/18 No significant neurologic change. Na improved to 152, remains on DDAVP with FWF. Weaning NE as able, BP remains labile. Midodrine initiated. 10/19 No significant neurologic change. DDAVP discontinued. BP improved with addition of midodrine, weaning NE. Increased midodrine to '15mg'$   TID. 10/20 Cortrak clogged this morning, awaiting PEG placement. Na 125 (134), discontinued D5W and FWF. 10/22 Fever spiked, POD1 from PEG  11/1 Persistent anemia of chronic disease.  Patient's family agreed to transfusion today.  11/2 Hypotensive, hypothermic started on vanc/cefepime, low dose levo 11/3 Steroid dose increased, 5% 25g albumin given, off pressor 11/4 RUE picc removed, mid-line placed  11/8 Continuing to work on facility placement without success  11/10 Hope talk w husband and son 11/15 No significant change in neurologic exam. CBC stable, Na 150 (downtrending). Remainder of labs stable. 11/16 No change in neuro exam. Na 142 (150). Hydralazine PRN overnight for elevated BP. 11/17 Unchanged clinical exam. Na 147 (142). BP better controlled. Trach change today.  Interim History / Subjective:  Trach changed yesterday Na stable   Objective:  Blood pressure (!) 158/96, pulse 72, temperature 97.8 F (36.6 C), temperature source Axillary, resp. rate 18, height '5\' 2"'$  (1.575 m), weight 80 kg, SpO2 99 %.    Vent Mode: AC FiO2 (%):  [40 %] 40 % Set Rate:  [18 bmp] 18 bmp Vt Set:  [400 mL] 400 mL PEEP:  [5 cmH20] 5 cmH20 Plateau Pressure:  [17 cmH20-20 cmH20] 20 cmH20   Intake/Output Summary (Last 24 hours) at 12/13/2021 1239 Last data filed at 12/13/2021 1000 Gross per 24 hour  Intake 3164.75 ml  Output 1600 ml  Net 1564.75 ml   Filed Weights   12/10/21 0300 12/11/21 0442 12/12/21 0500  Weight: 79.5 kg 79.5 kg 80 kg   Physical Examination:  General:  In bed on vent HENT: tongue swelling noted, tracheostomy in place PULM:  vent supported  breathing CV: acyanotic extremities GI: non distended MSK: normal bulk and tone Neuro: GCS three, makes some spontaneous movements   Resolved Hospital Issues  Hypokalemia, resolved Aspiration pneumonia VAP with sepsis -- stenotrophomonas maltophilia AKI  Assessment & Plan:   Subarachnoid hemorrhage with brain compression  and herniation syndrome Acute respiratory failure Ventilator dependence s/p tracheostomy Tongue swelling Central DI due to [facility] Anemia critical illness Adrenal insufficiency  Prognosis remains grim. Both palliative care and ethics have been involved this admission and thus far full scope of medical care has been pursued.  Full mechanical vent support VAP prevention Daily WUA/SBT Trach care per protocol Pulm hygiene Hydrocortisone to continue Continue DDAVP Continue free water> adjust to q3h BMET daily  GOC conversations per family meeting this weekend Continue to support patient Awaiting SNF placement  Best Practice: (right click and "Reselect all SmartList Selections" daily)  Diet/type: tubefeeds and NPO w/ meds via tube  DVT prophylaxis: prophylactic heparin  GI prophylaxis: PPI Lines: N/A  Foley:  N/A Code Status: Full Code  Signature:   [name], MD [place] PCCM Pager: [phone] Cell: [phone] After 7:00 pm call Elink  [phone]

## 2021-12-14 ENCOUNTER — Telehealth: Payer: Self-pay | Admitting: Pulmonary Disease

## 2021-12-14 DIAGNOSIS — I609 Nontraumatic subarachnoid hemorrhage, unspecified: Secondary | ICD-10-CM | POA: Diagnosis not present

## 2021-12-14 MED ORDER — FUROSEMIDE 10 MG/ML IJ SOLN
40.0000 mg | Freq: Once | INTRAMUSCULAR | Status: AC
  Administered 2021-12-14: 40 mg via INTRAVENOUS
  Filled 2021-12-14: qty 4

## 2021-12-14 NOTE — Telephone Encounter (Signed)
[facility] PCCM  Updated [name] by phone. He says that the family plans to make a decision regarding her disposition (comfort vs placement) the day after Thanksgiving.  [name], MD [place] [facility] Pager: [phone] Cell: [phone] After 7:00 pm call Elink  [phone]

## 2021-12-14 NOTE — Plan of Care (Signed)
Patient received on VC settings. No changes made at this time. Trach care done without issue.

## 2021-12-14 NOTE — Progress Notes (Signed)
No significant change in status.  Patient with minimal neurologic function.  No new recommendations from our standpoint.  Continue supportive care.

## 2021-12-14 NOTE — Progress Notes (Signed)
NAME:  [name], MRN:  [id number], DOB:  04/30/1975, LOS: 53 ADMISSION DATE:  10/21/2021, CONSULTATION DATE: 10/06/2021 REFERRING MD: EDP, CHIEF COMPLAINT: Subarachnoid hemorrhage  History of Present Illness:  46 yo female former smoker was found unresponsive by her husband and EMS called.  She was making gurgling noises with her breathing.  She was found to have SVT and EMS performed DCCV.  She had GCS 4 in ER and intubated for airway protection.  Found to have large SAH and neurosurgery arranged for hospital admission.  PCCM consulted to assist with management in ICU.  Pertinent Medical History:  Hypertension, uterine fibroids Legally blind in left eye  Significant Hospital Events: Including procedures, antibiotic start and stop dates in addition to other pertinent events   9/07 Presented unresponsive, hypotensive; CT head with subarachnoid hemorrhage 9/11 DDAVP give for Central DI, repeat head CT with worsening infarcts and herniation 9/12 Attempted transfer for second opinion at [facility] and [facility].  No beds at [facility] and transfer was declined at [facility] 9/13 No change in mental status.  Transfer to [facility] declined 9/14 [facility] declined transfer again.  No change.  DDAVP x 1 9/18 Palliative care consulted 9/23 Na increased >> restart DDAVP and increase free water 9/25 Plan for trach. Increased pressor requirement. Added midodrine  9/26 D/c D5W. Weaning NE as able. On I/O cath protocol  9/27 Worse renal fxn and new hyperkalemia  9/30 Renal function improving 10/5 Off levo 10/6 Plan for PEG tube 10/7 BPs elevated in AM, monitoring 10/17 PEG deferred in the setting of severe hypernatremia (173), DI. 10/18 No significant neurologic change. Na improved to 152, remains on DDAVP with FWF. Weaning NE as able, BP remains labile. Midodrine initiated. 10/19 No significant neurologic change. DDAVP discontinued. BP improved with addition of midodrine, weaning NE. Increased midodrine to '15mg'$   TID. 10/20 Cortrak clogged this morning, awaiting PEG placement. Na 125 (134), discontinued D5W and FWF. 10/22 Fever spiked, POD1 from PEG  11/1 Persistent anemia of chronic disease.  Patient's family agreed to transfusion today.  11/2 Hypotensive, hypothermic started on vanc/cefepime, low dose levo 11/3 Steroid dose increased, 5% 25g albumin given, off pressor 11/4 RUE picc removed, mid-line placed  11/8 Continuing to work on facility placement without success  11/10 [place] talk w husband and son 11/15 No significant change in neurologic exam. CBC stable, Na 150 (downtrending). Remainder of labs stable. 11/16 No change in neuro exam. Na 142 (150). Hydralazine PRN overnight for elevated BP. 11/17 Unchanged clinical exam. Na 147 (142). BP better controlled. Trach change today.  Interim History / Subjective:   Slight increase in facial swelling   Objective:  Blood pressure (!) 143/85, pulse 64, temperature (!) 97.4 F (36.3 C), temperature source Axillary, resp. rate 18, height '5\' 2"'$  (1.575 m), weight 80 kg, SpO2 95 %.    Vent Mode: Other (Comment) FiO2 (%):  [40 %] 40 % Set Rate:  [18 bmp] 18 bmp Vt Set:  [400 mL] 400 mL PEEP:  [5 cmH20] 5 cmH20 Plateau Pressure:  [16 cmH20-20 cmH20] 18 cmH20   Intake/Output Summary (Last 24 hours) at 12/14/2021 0711 Last data filed at 12/14/2021 1443 Gross per 24 hour  Intake 2610.79 ml  Output 2400 ml  Net 210.79 ml   Filed Weights   12/10/21 0300 12/11/21 0442 12/12/21 0500  Weight: 79.5 kg 79.5 kg 80 kg   Physical Examination:  General:  In bed on vent HENT: mild periorbital edema, NCAT tongue swelling noted,  tracheostomy in place PULM: CTA B, vent supported breathing CV: RRR, no mgr GI: BS+, soft, nontender MSK: normal bulk and tone Neuro: GCS 3   Resolved Hospital Issues  Hypokalemia, resolved Aspiration pneumonia VAP with sepsis -- stenotrophomonas maltophilia AKI  Assessment & Plan:   Subarachnoid hemorrhage with brain  compression and herniation syndrome Acute respiratory failure Ventilator dependence s/p tracheostomy Tongue swelling Central DI due to [facility] Anemia critical illness Adrenal insufficiency  Prognosis remains grim. Both palliative care and ethics have been involved this admission and thus far full scope of medical care has been pursued.  Lasix today Full mechanical vent support VAP prevention Pulm hygiene Hydrocortisone to continue  Ddavp Free water BMET again in AM to monitor sodium   GOC conversations per family meeting this weekend Continue to support patient Awaiting SNF placement  Best Practice: (right click and "Reselect all SmartList Selections" daily)  Diet/type: tubefeeds and NPO w/ meds via tube  DVT prophylaxis: prophylactic heparin  GI prophylaxis: PPI Lines: N/A  Foley:  N/A Code Status: Full Code  Signature:   [name], MD [place] PCCM Pager: [phone] Cell: [phone] After 7:00 pm call Elink  [phone]

## 2021-12-15 DIAGNOSIS — I609 Nontraumatic subarachnoid hemorrhage, unspecified: Secondary | ICD-10-CM | POA: Diagnosis not present

## 2021-12-15 LAB — BASIC METABOLIC PANEL
Anion gap: 10 (ref 5–15)
BUN: 45 mg/dL — ABNORMAL HIGH (ref 6–20)
CO2: 20 mmol/L — ABNORMAL LOW (ref 22–32)
Calcium: 7.6 mg/dL — ABNORMAL LOW (ref 8.9–10.3)
Chloride: 129 mmol/L — ABNORMAL HIGH (ref 98–111)
Creatinine, Ser: 1.39 mg/dL — ABNORMAL HIGH (ref 0.44–1.00)
GFR, Estimated: 47 mL/min — ABNORMAL LOW (ref >60)
Glucose, Bld: 96 mg/dL (ref 70–99)
Potassium: 5.3 mmol/L — ABNORMAL HIGH (ref 3.5–5.1)
Sodium: 159 mmol/L — ABNORMAL HIGH (ref 135–145)

## 2021-12-15 MED ORDER — AMLODIPINE BESYLATE 5 MG PO TABS
5.0000 mg | ORAL_TABLET | Freq: Once | ORAL | Status: AC | PRN
  Administered 2021-12-15: 5 mg via NASOGASTRIC
  Filled 2021-12-15: qty 1

## 2021-12-15 MED ORDER — FREE WATER
200.0000 mL | Status: DC
  Administered 2021-12-15 – 2021-12-17 (×29): 200 mL

## 2021-12-15 NOTE — Progress Notes (Signed)
NAME:  [name], MRN:  [id number], DOB:  Feb 15, 1975, LOS: 4 ADMISSION DATE:  10/05/2021, CONSULTATION DATE: 10/04/2021 REFERRING MD: EDP, CHIEF COMPLAINT: Subarachnoid hemorrhage  History of Present Illness:  46 yo female former smoker was found unresponsive by her husband and EMS called.  She was making gurgling noises with her breathing.  She was found to have SVT and EMS performed DCCV.  She had GCS 4 in ER and intubated for airway protection.  Found to have large SAH and neurosurgery arranged for hospital admission.  PCCM consulted to assist with management in ICU.  Pertinent Medical History:  Hypertension, uterine fibroids Legally blind in left eye  Significant Hospital Events: Including procedures, antibiotic start and stop dates in addition to other pertinent events   9/07 Presented unresponsive, hypotensive; CT head with subarachnoid hemorrhage 9/11 DDAVP give for Central DI, repeat head CT with worsening infarcts and herniation 9/12 Attempted transfer for second opinion at [facility].  No beds at [facility] and transfer was declined at [facility] 9/13 No change in mental status.  Transfer to [facility] declined 9/14 [facility] declined transfer again.  No change.  DDAVP x 1 9/18 Palliative care consulted 9/23 Na increased >> restart DDAVP and increase free water 9/25 Plan for trach. Increased pressor requirement. Added midodrine  9/26 D/c D5W. Weaning NE as able. On I/O cath protocol  9/27 Worse renal fxn and new hyperkalemia  9/30 Renal function improving 10/5 Off levo 10/6 Plan for PEG tube 10/7 BPs elevated in AM, monitoring 10/17 PEG deferred in the setting of severe hypernatremia (173), DI. 10/18 No significant neurologic change. Na improved to 152, remains on DDAVP with FWF. Weaning NE as able, BP remains labile. Midodrine initiated. 10/19 No significant neurologic change. DDAVP discontinued. BP improved with addition of midodrine, weaning NE. Increased midodrine to '15mg'$   TID. 10/20 Cortrak clogged this morning, awaiting PEG placement. Na 125 (134), discontinued D5W and FWF. 10/22 Fever spiked, POD1 from PEG  11/1 Persistent anemia of chronic disease.  Patient's family agreed to transfusion today.  11/2 Hypotensive, hypothermic started on vanc/cefepime, low dose levo 11/3 Steroid dose increased, 5% 25g albumin given, off pressor 11/4 RUE picc removed, mid-line placed  11/8 Continuing to work on facility placement without success  11/10 [place] talk w husband and son 11/15 No significant change in neurologic exam. CBC stable, Na 150 (downtrending). Remainder of labs stable. 11/16 No change in neuro exam. Na 142 (150). Hydralazine PRN overnight for elevated BP. 11/17 Unchanged clinical exam. Na 147 (142). BP better controlled. Trach change today.  Interim History / Subjective:   No meaningful clinical changes since my last assessment ~2 weeks ago.   Objective:  Blood pressure 138/88, pulse 68, temperature (!) 96.9 F (36.1 C), temperature source Axillary, resp. rate 18, height '5\' 2"'$  (1.575 m), weight 80 kg, SpO2 96 %.    Vent Mode: Other (Comment) FiO2 (%):  [40 %] 40 % Set Rate:  [18 bmp] 18 bmp Vt Set:  [400 mL] 400 mL PEEP:  [5 cmH20] 5 cmH20 Plateau Pressure:  [10 cmH20-19 cmH20] 10 cmH20   Intake/Output Summary (Last 24 hours) at 12/15/2021 1013 Last data filed at 12/15/2021 0917 Gross per 24 hour  Intake 3634.77 ml  Output 4650 ml  Net -1015.23 ml    Filed Weights   12/11/21 0442 12/12/21 0500 12/15/21 0500  Weight: 79.5 kg 80 kg 80 kg   Physical Examination:  General:  In bed on vent HENT: mild  periorbital edema, NCAT tongue swelling noted, tracheostomy in place PULM: CTA B, vent supported breathing CV: RRR, no mgr GI: BS+, soft, nontender MSK: normal bulk and tone Neuro: GCS 3   Resolved Hospital Issues  Hypokalemia, resolved Aspiration pneumonia VAP with sepsis -- stenotrophomonas maltophilia   Assessment & Plan:    Subarachnoid hemorrhage and cardiac arrest with subsequent neurologic devastation: -- Supportive care -- Goals of care, no recovery expected -- Awaiting placement   Hypernatremia due to central DI: Improved with DDAVP and free water flushes. worse with Na containing fluids and lasix and decrease in free water -- Ideally weekly labs to minimize lab draw in the setting of anemia -- recent worsening with lasix and fluids - stop both 11/20 --increase free water   Anemia of chronic disease and blood loss anemia in the setting of recurrent phlebotomy: -- Hemoglobin stable, unclear why such frequent labs recently, reduce to once weekly once AKI and Na better -- Blood products refused in the past per family  AKI: with recent lasix --stop lasix --increase free water  Adrenal insufficiency: -- Hydrocortisone 20 mg twice daily    Best Practice: (right click and "Reselect all SmartList Selections" daily)  Diet/type: tubefeeds and NPO w/ meds via tube  DVT prophylaxis: prophylactic heparin  GI prophylaxis: PPI Lines: N/A  Foley:  N/A Code Status: Full Code  Signature:   CRITICAL CARE Performed by: Lanier Clam   Total critical care time: 31 minutes  Critical care time was exclusive of separately billable procedures and treating other patients.  Critical care was necessary to treat or prevent imminent or life-threatening deterioration.  Critical care was time spent personally by me on the following activities: development of treatment plan with patient and/or surrogate as well as nursing, discussions with consultants, evaluation of patient's response to treatment, examination of patient, obtaining history from patient or surrogate, ordering and performing treatments and interventions, ordering and review of laboratory studies, ordering and review of radiographic studies, pulse oximetry and re-evaluation of patient's condition.   Lanier Clam, MD [place] PCCM See  Amion for contact info After 7:00 pm call Elink  [phone]

## 2021-12-15 NOTE — Progress Notes (Signed)
eLink Physician-Brief Progress Note Patient Name: [name] DOB: Oct 01, 1975 MRN: [id number]   Date of Service  12/15/2021  HPI/Events of Note  BSRN asking for PRN hydralazine to be increased to maintain goal SBP 160, Cr at 1  eICU Interventions  Discussed with RN. Amlodepine prn once via NG tube ordered.     Intervention Category Intermediate Interventions: Hypertension - evaluation and management  Elmer Sow 12/15/2021, 5:36 AM

## 2021-12-15 NOTE — TOC Progression Note (Signed)
Transition of Care [facility]) - Progression Note    Patient Details  Name: [name] MRN: [id number] Date of Birth: January 09, 1976  Transition of Care [facility]) CM/SW [place], LCSW Phone Number: 12/15/2021, 9:59 AM  Clinical Narrative:    CSW continuing to await bed availability at [facility].   Expected Discharge Plan: [place] Barriers to Discharge: Ship broker, SNF Pending bed offer  Expected Discharge Plan and Services Expected Discharge Plan: [place] In-house Referral: Clinical Social Work, Hospice / Palliative Care Discharge Planning Services: CM Consult Post Acute Care Choice: [place] Living arrangements for the past 2 months: Single Family Home                                       Social Determinants of Health (SDOH) Interventions    Readmission Risk Interventions     No data to display

## 2021-12-15 NOTE — TOC Progression Note (Addendum)
Transition of Care [facility]) - Progression Note    Patient Details  Name: [name] MRN: [id number] Date of Birth: 1975-08-12  Transition of Care [facility]) CM/SW [place], LCSW Phone Number: 12/15/2021, 9:59 AM  Clinical Narrative:    CSW left message for [facility].   Update: No bed available yet.   Expected Discharge Plan: Skilled Nursing Facility Barriers to Discharge: Ship broker, SNF Pending bed offer  Expected Discharge Plan and Services Expected Discharge Plan: [place] In-house Referral: Clinical Social Work, Hospice / Palliative Care Discharge Planning Services: CM Consult Post Acute Care Choice: [place] Living arrangements for the past 2 months: Single Family Home                                       Social Determinants of Health (SDOH) Interventions    Readmission Risk Interventions     No data to display

## 2021-12-15 NOTE — Progress Notes (Signed)
Patient ID: [name], female   DOB: 1975/08/12, 46 y.o.   MRN: [id number] BP (!) 164/102 (BP Location: Right Arm)   Pulse 73   Temp (!) 97.5 F (36.4 C) (Oral)   Resp 18   Ht '5\' 2"'$  (1.575 m)   Wt 80 kg   SpO2 98%   BMI 32.26 kg/m  Comatose Pupils dilated fixed, no corneal, no oculocephalic +cough Prognosis is grim Family will gather the end of this week.

## 2021-12-16 DIAGNOSIS — I609 Nontraumatic subarachnoid hemorrhage, unspecified: Secondary | ICD-10-CM | POA: Diagnosis not present

## 2021-12-16 NOTE — Progress Notes (Signed)
NAME:  [name], MRN:  [id number], DOB:  August 11, 1975, LOS: 29 ADMISSION DATE:  10/10/2021, CONSULTATION DATE: 09/27/2021 REFERRING MD: EDP, CHIEF COMPLAINT: Subarachnoid hemorrhage  History of Present Illness:  46 yo female former smoker was found unresponsive by her husband and EMS called.  She was making gurgling noises with her breathing.  She was found to have SVT and EMS performed DCCV.  She had GCS 4 in ER and intubated for airway protection.  Found to have large SAH and neurosurgery arranged for hospital admission.  PCCM consulted to assist with management in ICU.  Pertinent Medical History:  Hypertension, uterine fibroids Legally blind in left eye  Significant Hospital Events: Including procedures, antibiotic start and stop dates in addition to other pertinent events   9/07 Presented unresponsive, hypotensive; CT head with subarachnoid hemorrhage 9/11 DDAVP give for Central DI, repeat head CT with worsening infarcts and herniation 9/12 Attempted transfer for second opinion at [facility] and [facility].  No beds at [facility] and transfer was declined at [facility] 9/13 No change in mental status.  Transfer to [facility] declined 9/14 [facility] declined transfer again.  No change.  DDAVP x 1 9/18 Palliative care consulted 9/23 Na increased >> restart DDAVP and increase free water 9/25 Plan for trach. Increased pressor requirement. Added midodrine  9/26 D/c D5W. Weaning NE as able. On I/O cath protocol  9/27 Worse renal fxn and new hyperkalemia  9/30 Renal function improving 10/5 Off levo 10/6 Plan for PEG tube 10/7 BPs elevated in AM, monitoring 10/17 PEG deferred in the setting of severe hypernatremia (173), DI. 10/18 No significant neurologic change. Na improved to 152, remains on DDAVP with FWF. Weaning NE as able, BP remains labile. Midodrine initiated. 10/19 No significant neurologic change. DDAVP discontinued. BP improved with addition of midodrine, weaning NE. Increased midodrine to '15mg'$   TID. 10/20 Cortrak clogged this morning, awaiting PEG placement. Na 125 (134), discontinued D5W and FWF. 10/22 Fever spiked, POD1 from PEG  11/1 Persistent anemia of chronic disease.  Patient's family agreed to transfusion today.  11/2 Hypotensive, hypothermic started on vanc/cefepime, low dose levo 11/3 Steroid dose increased, 5% 25g albumin given, off pressor 11/4 RUE picc removed, mid-line placed  11/8 Continuing to work on facility placement without success  11/10 [place] talk w husband and son 11/15 No significant change in neurologic exam. CBC stable, Na 150 (downtrending). Remainder of labs stable. 11/16 No change in neuro exam. Na 142 (150). Hydralazine PRN overnight for elevated BP. 11/17 Unchanged clinical exam. Na 147 (142). BP better controlled. Trach change today.  Interim History / Subjective:   NAEON. Remains apneic. No meaningful neurologic function.   Objective:  Blood pressure 124/79, pulse 78, temperature 97.9 F (36.6 C), temperature source Axillary, resp. rate 18, height '5\' 2"'$  (1.575 m), weight 81.3 kg, SpO2 96 %.    Vent Mode: Other (Comment) FiO2 (%):  [40 %] 40 % Set Rate:  [18 bmp] 18 bmp Vt Set:  [400 mL] 400 mL PEEP:  [5 cmH20] 5 cmH20 Plateau Pressure:  [10 cmH20-20 cmH20] 20 cmH20   Intake/Output Summary (Last 24 hours) at 12/16/2021 1130 Last data filed at 12/16/2021 1000 Gross per 24 hour  Intake 3228.41 ml  Output 2900 ml  Net 328.41 ml    Filed Weights   12/12/21 0500 12/15/21 0500 12/16/21 0500  Weight: 80 kg 80 kg 81.3 kg   Physical Examination:  General:  In bed on vent HENT: mild periorbital edema, NCAT tongue swelling  noted, tracheostomy in place PULM: CTA B, vent supported breathing CV: RRR, no mgr GI: BS+, soft, nontender MSK: normal bulk and tone Neuro: GCS 3   Resolved Hospital Issues  Hypokalemia, resolved Aspiration pneumonia VAP with sepsis -- stenotrophomonas maltophilia   Assessment & Plan:   Subarachnoid hemorrhage  and cardiac arrest with subsequent neurologic devastation: -- Supportive care -- Goals of care, no recovery expected -- Awaiting placement   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 worse with Na containing fluids and lasix and decrease in free water -- Ideally weekly labs to minimize lab draw in the setting of anemia -- recent worsening with lasix and fluids - stop both 11/20 --Continue DDAVP  --increase free water 11/20   Anemia of chronic disease and blood loss anemia in the setting of recurrent phlebotomy: -- Hemoglobin stable, unclear why such frequent labs recently, reduce to once weekly once AKI and Na better -- Blood products refused in the past per family  AKI: with recent lasix --stop lasix --increase free water  Adrenal insufficiency: -- Hydrocortisone 20 mg twice daily    Best Practice: (right click and "Reselect all SmartList Selections" daily)  Diet/type: tubefeeds and NPO w/ meds via tube  DVT prophylaxis: prophylactic heparin  GI prophylaxis: PPI Lines: N/A  Foley:  N/A Code Status: Full Code  Signature:   CRITICAL CARE N/a   Lanier Clam, MD [place] PCCM See Amion for contact info After 7:00 pm call Elink  [phone]

## 2021-12-16 NOTE — Progress Notes (Signed)
Patient ID: [name], female   DOB: 05/01/1975, 46 y.o.   MRN: [id number] BP 133/82   Pulse 83   Temp 98.7 F (37.1 C) (Axillary)   Resp 18   Ht '5\' 2"'$  (1.575 m)   Wt 81.3 kg   SpO2 96%   BMI 32.78 kg/m  Comatose No corneal reflex, no pupillary reaction to light No cough, no gag today Prognosis remains quite poor.

## 2021-12-17 DIAGNOSIS — I609 Nontraumatic subarachnoid hemorrhage, unspecified: Secondary | ICD-10-CM | POA: Diagnosis not present

## 2021-12-17 MED ORDER — FREE WATER
200.0000 mL | Status: DC
  Administered 2021-12-17 – 2021-12-22 (×56): 200 mL

## 2021-12-17 NOTE — Progress Notes (Signed)
Dictation on: 12/17/2021 11:48 PM by: Ashok Pall [[id number]]

## 2021-12-17 NOTE — Progress Notes (Signed)
NAME:  [name], MRN:  [id number], DOB:  10-Nov-1975, LOS: 75 ADMISSION DATE:  10/04/2021, CONSULTATION DATE: 10/19/2021 REFERRING MD: EDP, CHIEF COMPLAINT: Subarachnoid hemorrhage  History of Present Illness:  46 yo female former smoker was found unresponsive by her husband and EMS called.  She was making gurgling noises with her breathing.  She was found to have SVT and EMS performed DCCV.  She had GCS 4 in ER and intubated for airway protection.  Found to have large SAH and neurosurgery arranged for hospital admission.  PCCM consulted to assist with management in ICU.  Pertinent Medical History:  Hypertension, uterine fibroids Legally blind in left eye  Significant Hospital Events: Including procedures, antibiotic start and stop dates in addition to other pertinent events   9/07 Presented unresponsive, hypotensive; CT head with subarachnoid hemorrhage 9/11 DDAVP give for Central DI, repeat head CT with worsening infarcts and herniation 9/12 Attempted transfer for second opinion at [facility] and [facility].  No beds at [facility] and transfer was declined at [facility] 9/13 No change in mental status.  Transfer to [facility] declined 9/14 [facility] declined transfer again.  No change.  DDAVP x 1 9/18 Palliative care consulted 9/23 Na increased >> restart DDAVP and increase free water 9/25 Plan for trach. Increased pressor requirement. Added midodrine  9/26 D/c D5W. Weaning NE as able. On I/O cath protocol  9/27 Worse renal fxn and new hyperkalemia  9/30 Renal function improving 10/5 Off levo 10/6 Plan for PEG tube 10/7 BPs elevated in AM, monitoring 10/17 PEG deferred in the setting of severe hypernatremia (173), DI. 10/18 No significant neurologic change. Na improved to 152, remains on DDAVP with FWF. Weaning NE as able, BP remains labile. Midodrine initiated. 10/19 No significant neurologic change. DDAVP discontinued. BP improved with addition of midodrine, weaning NE. Increased midodrine to '15mg'$   TID. 10/20 Cortrak clogged this morning, awaiting PEG placement. Na 125 (134), discontinued D5W and FWF. 10/22 Fever spiked, POD1 from PEG  11/1 Persistent anemia of chronic disease.  Patient's family agreed to transfusion today.  11/2 Hypotensive, hypothermic started on vanc/cefepime, low dose levo 11/3 Steroid dose increased, 5% 25g albumin given, off pressor 11/4 RUE picc removed, mid-line placed  11/8 Continuing to work on facility placement without success  11/10 [place] talk w husband and son 11/15 No significant change in neurologic exam. CBC stable, Na 150 (downtrending). Remainder of labs stable. 11/16 No change in neuro exam. Na 142 (150). Hydralazine PRN overnight for elevated BP. 11/17 Unchanged clinical exam. Na 147 (142). BP better controlled. Trach change today.  Interim History / Subjective:   NAEON. Remains apneic. No meaningful neurologic function.   Objective:  Blood pressure (!) 157/100, pulse 73, temperature 98.2 F (36.8 C), temperature source Axillary, resp. rate 18, height '5\' 2"'$  (1.575 m), weight 81.3 kg, SpO2 97 %.    Vent Mode: Other (Comment) FiO2 (%):  [40 %] 40 % Set Rate:  [18 bmp] 18 bmp Vt Set:  [400 mL] 400 mL PEEP:  [5 cmH20] 5 cmH20 Plateau Pressure:  [19 cmH20-20 cmH20] 20 cmH20   Intake/Output Summary (Last 24 hours) at 12/17/2021 1444 Last data filed at 12/17/2021 1419 Gross per 24 hour  Intake 2300 ml  Output 2850 ml  Net -550 ml    Filed Weights   12/15/21 0500 12/16/21 0500 12/17/21 0500  Weight: 80 kg 81.3 kg 81.3 kg   Physical Examination:  General:  In bed on vent HENT: mild periorbital edema, NCAT tongue  swelling noted, tracheostomy in place PULM: CTA B, vent supported breathing CV: RRR, no mgr GI: BS+, soft, nontender MSK: normal bulk and tone Neuro: GCS 3   Resolved Hospital Issues  Hypokalemia, resolved Aspiration pneumonia VAP with sepsis -- stenotrophomonas maltophilia   Assessment & Plan:   Subarachnoid  hemorrhage and cardiac arrest with subsequent neurologic devastation: -- Supportive care -- Goals of care, no recovery expected -- Awaiting placement   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 worse with Na containing fluids and lasix and decrease in free water -- Ideally weekly labs to minimize lab draw in the setting of anemia --Continue DDAVP  --increase free water 11/20   Anemia of chronic disease and blood loss anemia in the setting of recurrent phlebotomy: -- Hemoglobin stable, unclear why such frequent labs recently, reduce to once weekly -- Blood products refused in the past per family  AKI: with recent lasix --increase free water 11/20  Adrenal insufficiency: -- Hydrocortisone 20 mg twice daily    Best Practice: (right click and "Reselect all SmartList Selections" daily)  Diet/type: tubefeeds and NPO w/ meds via tube  DVT prophylaxis: prophylactic heparin  GI prophylaxis: PPI Lines: N/A  Foley:  N/A Code Status: Full Code  Signature:   CRITICAL CARE N/a   Lanier Clam, MD [place] PCCM See Amion for contact info After 7:00 pm call Elink  [phone]

## 2021-12-17 NOTE — TOC Progression Note (Signed)
Transition of Care [facility]) - Progression Note    Patient Details  Name: [name] MRN: [id number] Date of Birth: [birth year]  Transition of Care [facility]) CM/SW [place], LCSW Phone Number: 12/17/2021, 2:57 PM  Clinical Narrative:    Per [facility], no beds available.    Expected Discharge Plan: Skilled Nursing Facility Barriers to Discharge: Ship broker, SNF Pending bed offer  Expected Discharge Plan and Services Expected Discharge Plan: [place] In-house Referral: Clinical Social Work, Hospice / Palliative Care Discharge Planning Services: CM Consult Post Acute Care Choice: [place] Living arrangements for the past 2 months: Single Family Home                                       Social Determinants of Health (SDOH) Interventions    Readmission Risk Interventions     No data to display

## 2021-12-18 DIAGNOSIS — I609 Nontraumatic subarachnoid hemorrhage, unspecified: Secondary | ICD-10-CM | POA: Diagnosis not present

## 2021-12-18 MED ORDER — HYDROCORTISONE 10 MG PO TABS
10.0000 mg | ORAL_TABLET | Freq: Two times a day (BID) | ORAL | Status: DC
  Administered 2021-12-19 – 2021-12-26 (×13): 10 mg
  Filled 2021-12-18 (×15): qty 1

## 2021-12-18 NOTE — Plan of Care (Signed)
Patient remains on VC ventilator support as charted. Patient does not have a cough reflex when suctioned via the trach tube. No changes made at this time. Trach care done without issue.

## 2021-12-18 NOTE — Progress Notes (Signed)
NAME:  [name], MRN:  [id number], DOB:  12-23-1975, LOS: 57 ADMISSION DATE:  10/06/2021, CONSULTATION DATE: 10/07/2021 REFERRING MD: EDP, CHIEF COMPLAINT: Subarachnoid hemorrhage  History of Present Illness:  46 yo female former smoker was found unresponsive by her husband and EMS called.  She was making gurgling noises with her breathing.  She was found to have SVT and EMS performed DCCV.  She had GCS 4 in ER and intubated for airway protection.  Found to have large SAH and neurosurgery arranged for hospital admission.  PCCM consulted to assist with management in ICU.  Pertinent Medical History:  Hypertension, uterine fibroids Legally blind in left eye  Significant Hospital Events: Including procedures, antibiotic start and stop dates in addition to other pertinent events   9/07 Presented unresponsive, hypotensive; CT head with subarachnoid hemorrhage 9/11 DDAVP give for Central DI, repeat head CT with worsening infarcts and herniation 9/12 Attempted transfer for second opinion at [facility] and [facility].  No beds at [facility] and transfer was declined at [name] 9/13 No change in mental status.  Transfer to [facility] declined 9/14 [facility] declined transfer again.  No change.  DDAVP x 1 9/18 Palliative care consulted 9/23 Na increased >> restart DDAVP and increase free water 9/25 Plan for trach. Increased pressor requirement. Added midodrine  9/26 D/c D5W. Weaning NE as able. On I/O cath protocol  9/27 Worse renal fxn and new hyperkalemia  9/30 Renal function improving 10/5 Off levo 10/6 Plan for PEG tube 10/7 BPs elevated in AM, monitoring 10/17 PEG deferred in the setting of severe hypernatremia (173), DI. 10/18 No significant neurologic change. Na improved to 152, remains on DDAVP with FWF. Weaning NE as able, BP remains labile. Midodrine initiated. 10/19 No significant neurologic change. DDAVP discontinued. BP improved with addition of midodrine, weaning NE. Increased midodrine to '15mg'$   TID. 10/20 Cortrak clogged this morning, awaiting PEG placement. Na 125 (134), discontinued D5W and FWF. 10/22 Fever spiked, POD1 from PEG  11/1 Persistent anemia of chronic disease.  Patient's family agreed to transfusion today.  11/2 Hypotensive, hypothermic started on vanc/cefepime, low dose levo 11/3 Steroid dose increased, 5% 25g albumin given, off pressor 11/4 RUE picc removed, mid-line placed  11/8 Continuing to work on facility placement without success  11/10 [place] talk w husband and son 11/15 No significant change in neurologic exam. CBC stable, Na 150 (downtrending). Remainder of labs stable. 11/16 No change in neuro exam. Na 142 (150). Hydralazine PRN overnight for elevated BP. 11/17 Unchanged clinical exam. Na 147 (142). BP better controlled. Trach change today.  Interim History / Subjective:   No change in mental status.  Objective:  Blood pressure (!) 168/102, pulse 73, temperature (!) 97.5 F (36.4 C), resp. rate 18, height '5\' 2"'$  (1.575 m), weight 81.3 kg, SpO2 97 %.    Vent Mode: Other (Comment) FiO2 (%):  [40 %] 40 % Set Rate:  [18 bmp] 18 bmp Vt Set:  [400 mL] 400 mL PEEP:  [5 cmH20] 5 cmH20 Plateau Pressure:  [20 cmH20-22 cmH20] 22 cmH20   Intake/Output Summary (Last 24 hours) at 12/18/2021 0949 Last data filed at 12/18/2021 0900 Gross per 24 hour  Intake 3400 ml  Output 2175 ml  Net 1225 ml   Filed Weights   12/15/21 0500 12/16/21 0500 12/17/21 0500  Weight: 80 kg 81.3 kg 81.3 kg   Physical Examination: Gen:      No acute distress HEENT:  EOMI, sclera anicteric, significant tongue swelling, tracheostomy in  place Neck:     No masses; no thyromegaly Lungs:    Clear to auscultation bilaterally; normal respiratory effort CV:         Regular rate and rhythm; no murmurs Abd:      + bowel sounds; soft, non-tender; no palpable masses, no distension Ext:    No edema; adequate peripheral perfusion Skin:      Warm and dry; no rash Neuro: Comatose, pupils fixed  and dilated.   Resolved Hospital Issues  Hypokalemia, resolved Aspiration pneumonia VAP with sepsis -- stenotrophomonas maltophilia   Assessment & Plan:   Subarachnoid hemorrhage and cardiac arrest with subsequent neurologic devastation: Continue supportive care.  Prognosis for neurologic recovery.   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 worse with Na containing fluids and lasix and decrease in free water -- Ideally weekly labs to minimize lab draw in the setting of anemia --Continue DDAVP  --increase free water 11/20   Anemia of chronic disease and blood loss anemia in the setting of recurrent phlebotomy: -- Hemoglobin stable, unclear why such frequent labs recently, reduce to once weekly -- Blood products refused in the past per family  AKI: with recent lasix --increase free water 11/20  Adrenal insufficiency: -- Hydrocortisone 20 mg twice daily    Best Practice: (right click and "Reselect all SmartList Selections" daily)  Diet/type: tubefeeds and NPO w/ meds via tube  DVT prophylaxis: prophylactic heparin  GI prophylaxis: PPI Lines: N/A  Foley:  N/A Code Status: Full Code  Signature:   Marshell Garfinkel MD [place] [facility] See Amion for pager  If no response to pager , please call [phone] until 7pm After 7:00 pm call Elink  [phone] 12/18/2021, 9:52 AM

## 2021-12-18 NOTE — Progress Notes (Signed)
eLink Physician-Brief Progress Note Patient Name: HARLY PIPKINS DOB: 1975/03/26 MRN: [id number]   Date of Service  12/18/2021  HPI/Events of Note  RN reporting pt hypertensive.  Has prn hydralazine for SBP >160 q4h prn, rec'd at 1934.  SBP remains >160, now 164/100 (118), HR 77.  RN asking if you will order additional prn antihypertensive or change med  eICU Interventions  On review patient is on hydrocortisone 20 mg BID for cortisol of 1 and at one point patient was on pressors. Conferred with pharmacy and will decrease hydrocortisone to 10 BID starting tomorrow as tonight's dose already given. Further tapering of steroids per bedside rounding team as may still need to be on steroids from neurosurgery standpoint. Due hydralazine to be given. Discussed with bedside RN.     Intervention Category Intermediate Interventions: Hypertension - evaluation and management  Judd Lien 12/18/2021, 11:27 PM

## 2021-12-19 DIAGNOSIS — I609 Nontraumatic subarachnoid hemorrhage, unspecified: Secondary | ICD-10-CM | POA: Diagnosis not present

## 2021-12-19 MED ORDER — AMLODIPINE BESYLATE 5 MG PO TABS: 5.0000 mg | ORAL_TABLET | Freq: Every day | ORAL | Status: DC

## 2021-12-19 MED ORDER — AMLODIPINE BESYLATE 5 MG PO TABS
5.0000 mg | ORAL_TABLET | Freq: Every day | ORAL | Status: DC
  Administered 2021-12-19 – 2021-12-21 (×3): 5 mg
  Filled 2021-12-19 (×4): qty 1

## 2021-12-19 NOTE — Progress Notes (Signed)
NAME:  [name], MRN:  [id number], DOB:  [birth year], LOS: 8 ADMISSION DATE:  10/13/2021, CONSULTATION DATE: 10/17/2021 REFERRING MD: EDP, CHIEF COMPLAINT: Subarachnoid hemorrhage  History of Present Illness:  46 yo female former smoker was found unresponsive by her husband and EMS called.  She was making gurgling noises with her breathing.  She was found to have SVT and EMS performed DCCV.  She had GCS 4 in ER and intubated for airway protection.  Found to have large SAH and neurosurgery arranged for hospital admission.  PCCM consulted to assist with management in ICU.  Pertinent Medical History:  Hypertension, uterine fibroids Legally blind in left eye  Significant Hospital Events: Including procedures, antibiotic start and stop dates in addition to other pertinent events   9/07 Presented unresponsive, hypotensive; CT head with subarachnoid hemorrhage 9/11 DDAVP give for Central DI, repeat head CT with worsening infarcts and herniation 9/12 Attempted transfer for second opinion at [facility] and [facility].  No beds at [facility] and transfer was declined at [facility] 9/13 No change in mental status.  Transfer to [facility] declined 9/14 [facility] declined transfer again.  No change.  DDAVP x 1 9/18 Palliative care consulted 9/23 Na increased >> restart DDAVP and increase free water 9/25 Plan for trach. Increased pressor requirement. Added midodrine  9/26 D/c D5W. Weaning NE as able. On I/O cath protocol  9/27 Worse renal fxn and new hyperkalemia  9/30 Renal function improving 10/5 Off levo 10/6 Plan for PEG tube 10/7 BPs elevated in AM, monitoring 10/17 PEG deferred in the setting of severe hypernatremia (173), DI. 10/18 No significant neurologic change. Na improved to 152, remains on DDAVP with FWF. Weaning NE as able, BP remains labile. Midodrine initiated. 10/19 No significant neurologic change. DDAVP discontinued. BP improved with addition of midodrine, weaning NE. Increased midodrine to '15mg'$   TID. 10/20 Cortrak clogged this morning, awaiting PEG placement. Na 125 (134), discontinued D5W and FWF. 10/22 Fever spiked, POD1 from PEG  11/1 Persistent anemia of chronic disease.  Patient's family agreed to transfusion today.  11/2 Hypotensive, hypothermic started on vanc/cefepime, low dose levo 11/3 Steroid dose increased, 5% 25g albumin given, off pressor 11/4 RUE picc removed, mid-line placed  11/8 Continuing to work on facility placement without success  11/10 [place] talk w husband and son 11/15 No significant change in neurologic exam. CBC stable, Na 150 (downtrending). Remainder of labs stable. 11/16 No change in neuro exam. Na 142 (150). Hydralazine PRN overnight for elevated BP. 11/17 Unchanged clinical exam. Na 147 (142). BP better controlled. Trach change today. 11/24 no change in mental status.  Family was supposed to make decision on goals of care after Thanksgiving but have deferred to Monday.  Interim History / Subjective:   No change in mental status Hypertensive overnight  Objective:  Blood pressure (!) 185/116, pulse 75, temperature (!) 97.5 F (36.4 C), temperature source Axillary, resp. rate 16, height '5\' 2"'$  (1.575 m), weight 81.3 kg, SpO2 99 %.    Vent Mode: AC FiO2 (%):  [40 %] 40 % Set Rate:  [18 bmp] 18 bmp Vt Set:  [400 mL] 400 mL PEEP:  [5 cmH20] 5 cmH20 Plateau Pressure:  [18 cmH20-19 cmH20] 18 cmH20   Intake/Output Summary (Last 24 hours) at 12/19/2021 0908 Last data filed at 12/19/2021 0800 Gross per 24 hour  Intake 3300 ml  Output 2925 ml  Net 375 ml   Filed Weights   12/15/21 0500 12/16/21 0500 12/17/21 0500  Weight: 80  kg 81.3 kg 81.3 kg   Physical Examination: Gen:      No acute distress HEENT:  EOMI, sclera anicteric Neck:     No masses; no thyromegaly, tongue swollen protruding Lungs:    Clear to auscultation bilaterally; normal respiratory effort CV:         Regular rate and rhythm; no murmurs Abd:      + bowel sounds; soft,  non-tender; no palpable masses, no distension Ext:    No edema; adequate peripheral perfusion Skin:      Warm and dry; no rash Neuro: Comatose, pupils fixed and dilated  Resolved Hospital Issues  Hypokalemia, resolved Aspiration pneumonia VAP with sepsis -- stenotrophomonas maltophilia   Assessment & Plan:   Subarachnoid hemorrhage and cardiac arrest with subsequent neurologic devastation: Continue supportive care.  Prognosis for neurologic recovery.   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 worse with Na containing fluids and lasix and decrease in free water -- Ideally weekly labs to minimize lab draw in the setting of anemia --Continue DDAVP  --increase free water 11/20   Anemia of chronic disease and blood loss anemia in the setting of recurrent phlebotomy: -- Hemoglobin stable, unclear why such frequent labs recently, reduce to once weekly -- Blood products refused in the past per family  AKI: with recent lasix --increase free water 11/20  Adrenal insufficiency: -- Hydrocortisone reduced to 10 mg twice daily on 11/24. Will try a slow taper over the next week  Hypertension -- Add norvasc. Continue hydralazine PRN    Best Practice: (right click and "Reselect all SmartList Selections" daily)  Diet/type: tubefeeds and NPO w/ meds via tube  DVT prophylaxis: prophylactic heparin  GI prophylaxis: PPI Lines: N/A  Foley:  N/A Code Status: Full Code  Signature:   Marshell Garfinkel MD [place] [facility] See Amion for pager  If no response to pager , please call [phone] until 7pm After 7:00 pm call Elink  [phone] 12/19/2021, 9:08 AM

## 2021-12-19 NOTE — TOC Progression Note (Signed)
Transition of Care [facility]) - Progression Note    Patient Details  Name: [name] MRN: [id number] Date of Birth: 03-Apr-1975  Transition of Care [facility]) CM/SW [place], LCSW Phone Number: 12/19/2021, 5:11 PM  Clinical Narrative:    Still awaiting SNF bed.   Expected Discharge Plan: Skilled Nursing Facility Barriers to Discharge: Ship broker, SNF Pending bed offer  Expected Discharge Plan and Services Expected Discharge Plan: [place] In-house Referral: Clinical Social Work, Hospice / Palliative Care Discharge Planning Services: CM Consult Post Acute Care Choice: [place] Living arrangements for the past 2 months: Single Family Home                                       Social Determinants of Health (SDOH) Interventions    Readmission Risk Interventions     No data to display

## 2021-12-20 DIAGNOSIS — I609 Nontraumatic subarachnoid hemorrhage, unspecified: Secondary | ICD-10-CM | POA: Diagnosis not present

## 2021-12-20 NOTE — Progress Notes (Signed)
NAME:  [name], MRN:  [id number], DOB:  03-19-1975, LOS: 44 ADMISSION DATE:  10/03/2021, CONSULTATION DATE: 09/26/2021 REFERRING MD: EDP, CHIEF COMPLAINT: Subarachnoid hemorrhage  History of Present Illness:  46 yo female former smoker was found unresponsive by her husband and EMS called.  She was making gurgling noises with her breathing.  She was found to have SVT and EMS performed DCCV.  She had GCS 4 in ER and intubated for airway protection.  Found to have large SAH and neurosurgery arranged for hospital admission.  PCCM consulted to assist with management in ICU.  Pertinent Medical History:  Hypertension, uterine fibroids Legally blind in left eye  Significant Hospital Events: Including procedures, antibiotic start and stop dates in addition to other pertinent events   9/07 Presented unresponsive, hypotensive; CT head with subarachnoid hemorrhage 9/11 DDAVP give for Central DI, repeat head CT with worsening infarcts and herniation 9/12 Attempted transfer for second opinion at [facility] and [facility].  No beds at [facility] and transfer was declined at [facility] 9/13 No change in mental status.  Transfer to [facility] declined 9/14 [facility] declined transfer again.  No change.  DDAVP x 1 9/18 Palliative care consulted 9/23 Na increased >> restart DDAVP and increase free water 9/25 Plan for trach. Increased pressor requirement. Added midodrine  9/26 D/c D5W. Weaning NE as able. On I/O cath protocol  9/27 Worse renal fxn and new hyperkalemia  9/30 Renal function improving 10/5 Off levo 10/6 Plan for PEG tube 10/7 BPs elevated in AM, monitoring 10/17 PEG deferred in the setting of severe hypernatremia (173), DI. 10/18 No significant neurologic change. Na improved to 152, remains on DDAVP with FWF. Weaning NE as able, BP remains labile. Midodrine initiated. 10/19 No significant neurologic change. DDAVP discontinued. BP improved with addition of midodrine, weaning NE. Increased midodrine to '15mg'$   TID. 10/20 Cortrak clogged this morning, awaiting PEG placement. Na 125 (134), discontinued D5W and FWF. 10/22 Fever spiked, POD1 from PEG  11/1 Persistent anemia of chronic disease.  Patient's family agreed to transfusion today.  11/2 Hypotensive, hypothermic started on vanc/cefepime, low dose levo 11/3 Steroid dose increased, 5% 25g albumin given, off pressor 11/4 RUE picc removed, mid-line placed  11/8 Continuing to work on facility placement without success  11/10 [place] talk w husband and son 11/15 No significant change in neurologic exam. CBC stable, Na 150 (downtrending). Remainder of labs stable. 11/16 No change in neuro exam. Na 142 (150). Hydralazine PRN overnight for elevated BP. 11/17 Unchanged clinical exam. Na 147 (142). BP better controlled. Trach change today. 11/24 no change in mental status.  Family was supposed to make decision on goals of care after Thanksgiving but have deferred to next week 11/25 Started Norvasc for hypertension  Interim History / Subjective:   Mental status remains unchanged Started Norvasc for hypertension  Objective:  Blood pressure (!) 140/92, pulse 82, temperature 97.9 F (36.6 C), temperature source Axillary, resp. rate 18, height '5\' 2"'$  (1.575 m), weight 81.3 kg, SpO2 97 %.    Vent Mode: AC FiO2 (%):  [40 %] 40 % Set Rate:  [18 bmp] 18 bmp Vt Set:  [400 mL] 400 mL PEEP:  [5 cmH20] 5 cmH20 Plateau Pressure:  [10 cmH20-17 cmH20] 17 cmH20   Intake/Output Summary (Last 24 hours) at 12/20/2021 6962 Last data filed at 12/20/2021 0000 Gross per 24 hour  Intake 2050 ml  Output 800 ml  Net 1250 ml   Filed Weights   12/15/21 0500 12/16/21  0500 12/17/21 0500  Weight: 80 kg 81.3 kg 81.3 kg   Physical Examination: Comatose, pupils fixed and dilated Swollen tongue Lungs clear to auscultation Rate regular rhythm  Resolved Hospital Issues  Hypokalemia, resolved Aspiration pneumonia VAP with sepsis -- stenotrophomonas  maltophilia   Assessment & Plan:   Subarachnoid hemorrhage and cardiac arrest with subsequent neurologic devastation: Continue supportive care.  For prognosis for neurologic recovery.   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 worse with Na containing fluids and lasix and decrease in free water -- Ideally weekly labs to minimize lab draw in the setting of anemia --Continue DDAVP  --increase free water 11/20   Anemia of chronic disease and blood loss anemia in the setting of recurrent phlebotomy: -- Hemoglobin stable, unclear why such frequent labs recently, reduce to once weekly -- Blood products refused in the past per family  AKI: with recent lasix --increase free water 11/20  Adrenal insufficiency: -- Hydrocortisone reduced to 10 mg twice daily on 11/24. Will try a slow taper over the next week  Hypertension -- Added norvasc. Continue hydralazine PRN    Best Practice: (right click and "Reselect all SmartList Selections" daily)  Diet/type: tubefeeds and NPO w/ meds via tube  DVT prophylaxis: prophylactic heparin  GI prophylaxis: PPI Lines: N/A  Foley:  N/A Code Status: Full Code  Signature:   Marshell Garfinkel MD [place] [facility] See Amion for pager  If no response to pager , please call [phone] until 7pm After 7:00 pm call Elink  [phone] 12/20/2021, 8:22 AM

## 2021-12-20 NOTE — Plan of Care (Signed)
Patient received on VC ventilation as charted. No changes at this time. Trach care done without issue.

## 2021-12-20 NOTE — Plan of Care (Signed)
Patient remains on ventilator via tracheostomy.. Patient remains on enteral feeds via PEG. GCS = 3. Stem reflexes impaired. No purposeful response to stimuli.    Problem: Education: Goal: Knowledge of General Education information will improve Description: Including pain rating scale, medication(s)/side effects and non-pharmacologic comfort measures Outcome: Not Progressing   Problem: Health Behavior/Discharge Planning: Goal: Ability to manage health-related needs will improve Outcome: Not Progressing   Problem: Clinical Measurements: Goal: Ability to maintain clinical measurements within normal limits will improve Outcome: Not Progressing Goal: Will remain free from infection Outcome: Not Progressing Goal: Diagnostic test results will improve Outcome: Not Progressing Goal: Respiratory complications will improve Outcome: Not Progressing Goal: Cardiovascular complication will be avoided Outcome: Not Progressing   Problem: Activity: Goal: Risk for activity intolerance will decrease Outcome: Not Progressing   Problem: Nutrition: Goal: Adequate nutrition will be maintained Outcome: Not Progressing   Problem: Coping: Goal: Level of anxiety will decrease Outcome: Not Progressing   Problem: Elimination: Goal: Will not experience complications related to bowel motility Outcome: Not Progressing Goal: Will not experience complications related to urinary retention Outcome: Not Progressing   Problem: Pain Managment: Goal: General experience of comfort will improve Outcome: Not Progressing   Problem: Safety: Goal: Ability to remain free from injury will improve Outcome: Not Progressing   Problem: Skin Integrity: Goal: Risk for impaired skin integrity will decrease Outcome: Not Progressing   Problem: Activity: Goal: Ability to tolerate increased activity will improve Outcome: Not Progressing   Problem: Respiratory: Goal: Ability to maintain a clear airway and adequate  ventilation will improve Outcome: Not Progressing   Problem: Role Relationship: Goal: Method of communication will improve Outcome: Not Progressing   Problem: Education: Goal: Ability to describe self-care measures that may prevent or decrease complications (Diabetes Survival Skills Education) will improve Outcome: Not Progressing Goal: Individualized Educational Video(s) Outcome: Not Progressing   Problem: Coping: Goal: Ability to adjust to condition or change in health will improve Outcome: Not Progressing   Problem: Fluid Volume: Goal: Ability to maintain a balanced intake and output will improve Outcome: Not Progressing   Problem: Health Behavior/Discharge Planning: Goal: Ability to identify and utilize available resources and services will improve Outcome: Not Progressing Goal: Ability to manage health-related needs will improve Outcome: Not Progressing   Problem: Metabolic: Goal: Ability to maintain appropriate glucose levels will improve Outcome: Not Progressing   Problem: Nutritional: Goal: Maintenance of adequate nutrition will improve Outcome: Not Progressing Goal: Progress toward achieving an optimal weight will improve Outcome: Not Progressing   Problem: Skin Integrity: Goal: Risk for impaired skin integrity will decrease Outcome: Not Progressing   Problem: Tissue Perfusion: Goal: Adequacy of tissue perfusion will improve Outcome: Not Progressing   Problem: Education: Goal: Ability to describe self-care measures that may prevent or decrease complications (Diabetes Survival Skills Education) will improve Outcome: Not Progressing Goal: Individualized Educational Video(s) Outcome: Not Progressing   Problem: Cardiac: Goal: Ability to maintain an adequate cardiac output will improve Outcome: Not Progressing   Problem: Health Behavior/Discharge Planning: Goal: Ability to identify and utilize available resources and services will improve Outcome: Not  Progressing Goal: Ability to manage health-related needs will improve Outcome: Not Progressing   Problem: Fluid Volume: Goal: Ability to achieve a balanced intake and output will improve Outcome: Not Progressing   Problem: Metabolic: Goal: Ability to maintain appropriate glucose levels will improve Outcome: Not Progressing   Problem: Nutritional: Goal: Maintenance of adequate nutrition will improve Outcome: Not Progressing Goal: Maintenance of adequate weight for body size  and type will improve Outcome: Not Progressing   Problem: Respiratory: Goal: Will regain and/or maintain adequate ventilation Outcome: Not Progressing   Problem: Urinary Elimination: Goal: Ability to achieve and maintain adequate renal perfusion and functioning will improve Outcome: Not Progressing   Problem: Education: Goal: Knowledge about tracheostomy care/management will improve Outcome: Not Progressing   Problem: Activity: Goal: Ability to tolerate increased activity will improve Outcome: Not Progressing

## 2021-12-21 DIAGNOSIS — I609 Nontraumatic subarachnoid hemorrhage, unspecified: Secondary | ICD-10-CM | POA: Diagnosis not present

## 2021-12-21 NOTE — Progress Notes (Signed)
NAME:  [name], MRN:  [id number], DOB:  1975/12/27, LOS: 35 ADMISSION DATE:  10/03/2021, CONSULTATION DATE: 10/20/2021 REFERRING MD: EDP, CHIEF COMPLAINT: Subarachnoid hemorrhage  History of Present Illness:  46 yo female former smoker was found unresponsive by her husband and EMS called.  She was making gurgling noises with her breathing.  She was found to have SVT and EMS performed DCCV.  She had GCS 4 in ER and intubated for airway protection.  Found to have large SAH and neurosurgery arranged for hospital admission.  PCCM consulted to assist with management in ICU.  Pertinent Medical History:  Hypertension, uterine fibroids Legally blind in left eye  Significant Hospital Events: Including procedures, antibiotic start and stop dates in addition to other pertinent events   9/07 Presented unresponsive, hypotensive; CT head with subarachnoid hemorrhage 9/11 DDAVP give for Central DI, repeat head CT with worsening infarcts and herniation 9/12 Attempted transfer for second opinion at [facility] and [facility].  No beds at [facility] and transfer was declined at [facility] 9/13 No change in mental status.  Transfer to [facility] declined 9/14 [facility] declined transfer again.  No change.  DDAVP x 1 9/18 Palliative care consulted 9/23 Na increased >> restart DDAVP and increase free water 9/25 Plan for trach. Increased pressor requirement. Added midodrine  9/26 D/c D5W. Weaning NE as able. On I/O cath protocol  9/27 Worse renal fxn and new hyperkalemia  9/30 Renal function improving 10/5 Off levo 10/6 Plan for PEG tube 10/7 BPs elevated in AM, monitoring 10/17 PEG deferred in the setting of severe hypernatremia (173), DI. 10/18 No significant neurologic change. Na improved to 152, remains on DDAVP with FWF. Weaning NE as able, BP remains labile. Midodrine initiated. 10/19 No significant neurologic change. DDAVP discontinued. BP improved with addition of midodrine, weaning NE. Increased midodrine to '15mg'$   TID. 10/20 Cortrak clogged this morning, awaiting PEG placement. Na 125 (134), discontinued D5W and FWF. 10/22 Fever spiked, POD1 from PEG  11/1 Persistent anemia of chronic disease.  Patient's family agreed to transfusion today.  11/2 Hypotensive, hypothermic started on vanc/cefepime, low dose levo 11/3 Steroid dose increased, 5% 25g albumin given, off pressor 11/4 RUE picc removed, mid-line placed  11/8 Continuing to work on facility placement without success  11/10 [place] talk w husband and son 11/15 No significant change in neurologic exam. CBC stable, Na 150 (downtrending). Remainder of labs stable. 11/16 No change in neuro exam. Na 142 (150). Hydralazine PRN overnight for elevated BP. 11/17 Unchanged clinical exam. Na 147 (142). BP better controlled. Trach change today. 11/24 no change in mental status.  Family was supposed to make decision on goals of care after Thanksgiving but have deferred to next week 11/25 Started Norvasc for hypertension  Interim History / Subjective:   No change in clinical status  Objective:  Blood pressure (!) 156/102, pulse 75, temperature 97.6 F (36.4 C), temperature source Axillary, resp. rate 18, height '5\' 2"'$  (1.575 m), weight 79 kg, SpO2 99 %.    Vent Mode: AC FiO2 (%):  [40 %] 40 % Set Rate:  [18 bmp] 18 bmp Vt Set:  [400 mL] 400 mL PEEP:  [5 cmH20] 5 cmH20 Plateau Pressure:  [16 cmH20-18 cmH20] 16 cmH20   Intake/Output Summary (Last 24 hours) at 12/21/2021 0902 Last data filed at 12/21/2021 0800 Gross per 24 hour  Intake 3210 ml  Output 1920 ml  Net 1290 ml   Filed Weights   12/16/21 0500 12/17/21 0500 12/21/21 0500  Weight: 81.3 kg 81.3 kg 79 kg   Physical Examination: Comatose, pupils fixed and dilated Swollen tongue Lungs clear to auscultation Rate regular rhythm  Resolved Hospital Issues  Hypokalemia, resolved Aspiration pneumonia VAP with sepsis -- stenotrophomonas maltophilia   Assessment & Plan:   Subarachnoid  hemorrhage and cardiac arrest with subsequent neurologic devastation: Continue supportive care.  Poor prognosis for neurologic recovery.   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 worse with Na containing fluids and lasix and decrease in free water -- Ideally weekly labs to minimize lab draw in the setting of anemia --Continue DDAVP  --increase free water 11/20 - Weekly labs. Minimize blood draws   Anemia of chronic disease and blood loss anemia in the setting of recurrent phlebotomy: -- Hemoglobin stable, unclear why such frequent labs recently, reduce to once weekly -- Blood products refused in the past per family  AKI: with recent lasix --increase free water 11/20  Adrenal insufficiency: -- Hydrocortisone reduced to 10 mg twice daily on 11/24. Will try a slow taper over the next week  Hypertension -- Added norvasc. Continue hydralazine PRN    Best Practice: (right click and "Reselect all SmartList Selections" daily)  Diet/type: tubefeeds and NPO w/ meds via tube  DVT prophylaxis: prophylactic heparin  GI prophylaxis: PPI Lines: N/A  Foley:  N/A Code Status: Full Code  Signature:   Marshell Garfinkel MD [place] [facility] See Amion for pager  If no response to pager , please call [phone] until 7pm After 7:00 pm call Elink  [phone] 12/21/2021, 9:02 AM

## 2021-12-21 NOTE — Plan of Care (Signed)
Patient remains on VC settings as charted. Blood/pink-tinged secretions noted when suctioning in-line.

## 2021-12-22 DIAGNOSIS — J9600 Acute respiratory failure, unspecified whether with hypoxia or hypercapnia: Secondary | ICD-10-CM | POA: Diagnosis not present

## 2021-12-22 DIAGNOSIS — I609 Nontraumatic subarachnoid hemorrhage, unspecified: Secondary | ICD-10-CM | POA: Diagnosis not present

## 2021-12-22 LAB — BASIC METABOLIC PANEL
Anion gap: 11 (ref 5–15)
Anion gap: 9 (ref 5–15)
BUN: 66 mg/dL — ABNORMAL HIGH (ref 6–20)
BUN: 61 mg/dL — ABNORMAL HIGH (ref 6–20)
CO2: 20 mmol/L — ABNORMAL LOW (ref 22–32)
CO2: 21 mmol/L — ABNORMAL LOW (ref 22–32)
Calcium: 8.3 mg/dL — ABNORMAL LOW (ref 8.9–10.3)
Calcium: 8.5 mg/dL — ABNORMAL LOW (ref 8.9–10.3)
Chloride: 106 mmol/L (ref 98–111)
Chloride: 109 mmol/L (ref 98–111)
Creatinine, Ser: 2.17 mg/dL — ABNORMAL HIGH (ref 0.44–1.00)
Creatinine, Ser: 2.21 mg/dL — ABNORMAL HIGH (ref 0.44–1.00)
GFR, Estimated: 28 mL/min — ABNORMAL LOW (ref >60)
GFR, Estimated: 27 mL/min — ABNORMAL LOW (ref >60)
Glucose, Bld: 96 mg/dL (ref 70–99)
Glucose, Bld: 77 mg/dL (ref 70–99)
Potassium: 5.1 mmol/L (ref 3.5–5.1)
Potassium: 5.6 mmol/L — ABNORMAL HIGH (ref 3.5–5.1)
Sodium: 137 mmol/L (ref 135–145)
Sodium: 139 mmol/L (ref 135–145)

## 2021-12-22 LAB — CBC
HCT: 33.7 % — ABNORMAL LOW (ref 36.0–46.0)
Hemoglobin: 10.4 g/dL — ABNORMAL LOW (ref 12.0–15.0)
MCH: 31.3 pg (ref 26.0–34.0)
MCHC: 30.9 g/dL (ref 30.0–36.0)
MCV: 101.5 fL — ABNORMAL HIGH (ref 80.0–100.0)
Platelets: 112 10*3/uL — ABNORMAL LOW (ref 150–400)
RBC: 3.32 MIL/uL — ABNORMAL LOW (ref 3.87–5.11)
RDW: 21 % — ABNORMAL HIGH (ref 11.5–15.5)
WBC: 5.5 10*3/uL (ref 4.0–10.5)
nRBC: 0 % (ref 0.0–0.2)

## 2021-12-22 MED ORDER — FAMOTIDINE 20 MG PO TABS
20.0000 mg | ORAL_TABLET | Freq: Every day | ORAL | Status: DC
  Administered 2021-12-23 – 2021-12-31 (×9): 20 mg
  Filled 2021-12-22 (×9): qty 1

## 2021-12-22 MED ORDER — AMLODIPINE BESYLATE 10 MG PO TABS
10.0000 mg | ORAL_TABLET | Freq: Every day | ORAL | Status: DC
  Administered 2021-12-22 – 2021-12-30 (×9): 10 mg
  Filled 2021-12-22 (×9): qty 1

## 2021-12-22 NOTE — TOC Progression Note (Signed)
Transition of Care [facility]) - Progression Note    Patient Details  Name: [name] MRN: [id number] Date of Birth: 08-25-1975  Transition of Care [facility]) CM/SW [place], LCSW Phone Number: 12/22/2021, 4:59 PM  Clinical Narrative:    CSW reached out to [facility]; still awaiting a bed.    Expected Discharge Plan: Skilled Nursing Facility Barriers to Discharge: Ship broker, SNF Pending bed offer  Expected Discharge Plan and Services Expected Discharge Plan: [place] In-house Referral: Clinical Social Work, Hospice / Palliative Care Discharge Planning Services: CM Consult Post Acute Care Choice: [place] Living arrangements for the past 2 months: Single Family Home                                       Social Determinants of Health (SDOH) Interventions    Readmission Risk Interventions     No data to display

## 2021-12-22 NOTE — Progress Notes (Addendum)
NAME:  [name], MRN:  [id number], DOB:  Jun 30, 1975, LOS: 57 ADMISSION DATE:  10/06/2021, CONSULTATION DATE: 10/01/2021 REFERRING MD: EDP, CHIEF COMPLAINT: Subarachnoid hemorrhage  History of Present Illness:  46 yo female former smoker was found unresponsive by her husband and EMS called.  She was making gurgling noises with her breathing.  She was found to have SVT and EMS performed DCCV.  She had GCS 4 in ER and intubated for airway protection.  Found to have large SAH and neurosurgery arranged for hospital admission.  PCCM consulted to assist with management in ICU.  Pertinent Medical History:  Hypertension, uterine fibroids Legally blind in left eye  Significant Hospital Events: Including procedures, antibiotic start and stop dates in addition to other pertinent events   9/07 Presented unresponsive, hypotensive; CT head with subarachnoid hemorrhage 9/11 DDAVP give for Central DI, repeat head CT with worsening infarcts and herniation 9/12 Attempted transfer for second opinion at [facility] and [facility].  No beds at [facility] and transfer was declined at [facility] 9/13 No change in mental status.  Transfer to [facility] declined 9/14 [facility] declined transfer again.  No change.  DDAVP x 1 9/18 Palliative care consulted 9/23 Na increased >> restart DDAVP and increase free water 9/25 Plan for trach. Increased pressor requirement. Added midodrine  9/26 D/c D5W. Weaning NE as able. On I/O cath protocol  9/27 Worse renal fxn and new hyperkalemia  9/30 Renal function improving 10/5 Off levo 10/6 Plan for PEG tube 10/7 BPs elevated in AM, monitoring 10/17 PEG deferred in the setting of severe hypernatremia (173), DI. 10/18 No significant neurologic change. Na improved to 152, remains on DDAVP with FWF. Weaning NE as able, BP remains labile. Midodrine initiated. 10/19 No significant neurologic change. DDAVP discontinued. BP improved with addition of midodrine, weaning NE. Increased midodrine to '15mg'$   TID. 10/20 Cortrak clogged this morning, awaiting PEG placement. Na 125 (134), discontinued D5W and FWF. 10/22 Fever spiked, POD1 from PEG  11/1 Persistent anemia of chronic disease.  Patient's family agreed to transfusion today.  11/2 Hypotensive, hypothermic started on vanc/cefepime, low dose levo 11/3 Steroid dose increased, 5% 25g albumin given, off pressor 11/4 RUE picc removed, mid-line placed  11/8 Continuing to work on facility placement without success  11/10 [place] talk w husband and son 11/15 No significant change in neurologic exam. CBC stable, Na 150 (downtrending). Remainder of labs stable. 11/16 No change in neuro exam. Na 142 (150). Hydralazine PRN overnight for elevated BP. 11/17 Unchanged clinical exam. Na 147 (142). BP better controlled. Trach change today. 11/24 no change in mental status.  Family was supposed to make decision on goals of care after Thanksgiving but have deferred to next week 11/25 Started Norvasc for hypertension  Interim History / Subjective:   Na 137 from 159 (lab error?) Creat 2.17 from 1.39  No change clinically   Objective:  Blood pressure (!) 140/95, pulse 80, temperature 97.6 F (36.4 C), temperature source Axillary, resp. rate 18, height '5\' 2"'$  (1.575 m), weight 79 kg, SpO2 98 %.    Vent Mode: Other (Comment) FiO2 (%):  [40 %] 40 % Set Rate:  [18 bmp] 18 bmp Vt Set:  [400 mL] 400 mL PEEP:  [5 cmH20] 5 cmH20 Plateau Pressure:  [17 cmH20-19 cmH20] 18 cmH20   Intake/Output Summary (Last 24 hours) at 12/22/2021 0932 Last data filed at 12/22/2021 0800 Gross per 24 hour  Intake 3600 ml  Output 1400 ml  Net 2200 ml  Filed Weights   12/17/21 0500 12/21/21 0500 12/22/21 0357  Weight: 81.3 kg 79 kg 79 kg   Physical Examination: General:  critically ill appearing on mech vent HEENT: MM pink/moist; trach in place; swollen tongue wrapped in gauze dressings Neuro: comatose, pupils fixed and dilated CV: s1s2, RRR, no m/r/g PULM:  dim  clear BS bilaterally; trached on mech vent PRVC GI: soft, bsx4 active  Extremities: warm/dry, no edema  Skin: no rashes or lesions appreciated  Resolved Hospital Issues  Hypokalemia, resolved Aspiration pneumonia VAP with sepsis -- stenotrophomonas maltophilia   Assessment & Plan:   Subarachnoid hemorrhage and cardiac arrest with subsequent neurologic devastation: P: -poor prognosis -supportive care   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 worse with Na containing fluids and lasix and decrease in free water P: -na dropped from 159 to 137; repeat bmp for possible lab error -continue ddavp and free water   Anemia of chronic disease and blood loss anemia in the setting of recurrent phlebotomy: P: -trend H/H -reduce lab draws when able -Blood products refused in the past per family  AKI: with recent lasix P: -creat spiked 2.17 from 1.39 -continue free water  Adrenal insufficiency: P: -continue hydrocortisone; consider tapering this week  Hypertension P: -increase norvasc  -Continue hydralazine PRN    Best Practice: (right click and "Reselect all SmartList Selections" daily)  Diet/type: tubefeeds and NPO w/ meds via tube  DVT prophylaxis: prophylactic heparin  GI prophylaxis: PPI Lines: N/A  Foley:  N/A Code Status: Full Code GOC: 11/27 spoke with son at bedside; palliative care following  Signature:   JD [facility] 12/22/2021, 9:49 AM  Please see [email or web address] for pager details.  From 7A-7P if no response, please call [phone]. After hours, please call ELink [phone].

## 2021-12-22 NOTE — Progress Notes (Signed)
Patient ID: [name], female   DOB: December 27, 1975, 46 y.o.   MRN: [id number] BP (!) 153/98   Pulse 80   Temp (!) 96.8 F (36 C) (Axillary)   Resp 18   Ht '5\' 2"'$  (1.575 m)   Wt 79 kg   SpO2 97%   BMI 31.85 kg/m  Comatose. No change in exam Supposedly decision to be made about her future care will be made this week.

## 2021-12-23 DIAGNOSIS — J9610 Chronic respiratory failure, unspecified whether with hypoxia or hypercapnia: Secondary | ICD-10-CM | POA: Diagnosis not present

## 2021-12-23 DIAGNOSIS — I609 Nontraumatic subarachnoid hemorrhage, unspecified: Secondary | ICD-10-CM | POA: Diagnosis not present

## 2021-12-23 DIAGNOSIS — Z930 Tracheostomy status: Secondary | ICD-10-CM | POA: Diagnosis not present

## 2021-12-23 DIAGNOSIS — J9621 Acute and chronic respiratory failure with hypoxia: Secondary | ICD-10-CM | POA: Insufficient documentation

## 2021-12-23 LAB — CBC
HCT: 33.5 % — ABNORMAL LOW (ref 36.0–46.0)
Hemoglobin: 10.4 g/dL — ABNORMAL LOW (ref 12.0–15.0)
MCH: 31.2 pg (ref 26.0–34.0)
MCHC: 31 g/dL (ref 30.0–36.0)
MCV: 100.6 fL — ABNORMAL HIGH (ref 80.0–100.0)
Platelets: 126 10*3/uL — ABNORMAL LOW (ref 150–400)
RBC: 3.33 MIL/uL — ABNORMAL LOW (ref 3.87–5.11)
RDW: 20.6 % — ABNORMAL HIGH (ref 11.5–15.5)
WBC: 5 10*3/uL (ref 4.0–10.5)
nRBC: 0 % (ref 0.0–0.2)

## 2021-12-23 LAB — BASIC METABOLIC PANEL
Anion gap: 8 (ref 5–15)
BUN: 65 mg/dL — ABNORMAL HIGH (ref 6–20)
CO2: 21 mmol/L — ABNORMAL LOW (ref 22–32)
Calcium: 8.2 mg/dL — ABNORMAL LOW (ref 8.9–10.3)
Chloride: 109 mmol/L (ref 98–111)
Creatinine, Ser: 2.38 mg/dL — ABNORMAL HIGH (ref 0.44–1.00)
GFR, Estimated: 25 mL/min — ABNORMAL LOW (ref >60)
Glucose, Bld: 97 mg/dL (ref 70–99)
Potassium: 4.8 mmol/L (ref 3.5–5.1)
Sodium: 138 mmol/L (ref 135–145)

## 2021-12-23 MED ORDER — LACTATED RINGERS IV BOLUS
1000.0000 mL | Freq: Once | INTRAVENOUS | Status: AC
  Administered 2021-12-23: 1000 mL via INTRAVENOUS

## 2021-12-23 NOTE — Progress Notes (Signed)
Patient ID: [name], female   DOB: 1975-06-27, 46 y.o.   MRN: [id number] Comatose. No neurological change.

## 2021-12-23 NOTE — TOC Progression Note (Signed)
Transition of Care [facility]) - Progression Note    Patient Details  Name: [name] MRN: [id number] Date of Birth: [birth year]  Transition of Care [facility]) CM/SW [place], LCSW Phone Number: 12/23/2021, 5:31 PM  Clinical Narrative:    CSW left message for [facility] to see if they have any beds available since [facility] does not seem to be having any discharges.    Expected Discharge Plan: Skilled Nursing Facility Barriers to Discharge: Ship broker, SNF Pending bed offer  Expected Discharge Plan and Services Expected Discharge Plan: [place] In-house Referral: Clinical Social Work, Hospice / Palliative Care Discharge Planning Services: CM Consult Post Acute Care Choice: [place] Living arrangements for the past 2 months: Single Family Home                                       Social Determinants of Health (SDOH) Interventions    Readmission Risk Interventions     No data to display

## 2021-12-23 NOTE — Progress Notes (Addendum)
NAME:  [name], MRN:  [id number], DOB:  [birth year], LOS: 26 ADMISSION DATE:  10/15/2021, CONSULTATION DATE: 10/12/2021 REFERRING MD: EDP, CHIEF COMPLAINT: Subarachnoid hemorrhage  History of Present Illness:  46 yo female former smoker was found unresponsive by her husband and EMS called.  She was making gurgling noises with her breathing.  She was found to have SVT and EMS performed DCCV.  She had GCS 4 in ER and intubated for airway protection.  Found to have large SAH and neurosurgery arranged for hospital admission.  PCCM consulted to assist with management in ICU.  Pertinent Medical History:  Hypertension, uterine fibroids Legally blind in left eye  Significant Hospital Events: Including procedures, antibiotic start and stop dates in addition to other pertinent events   9/07 Presented unresponsive, hypotensive; CT head with subarachnoid hemorrhage 9/11 DDAVP give for Central DI, repeat head CT with worsening infarcts and herniation 9/12 Attempted transfer for second opinion at [facility] and [facility].  No beds at [facility] and transfer was declined at [facility] 9/13 No change in mental status.  Transfer to [facility] declined 9/14 [facility] declined transfer again.  No change.  DDAVP x 1 9/18 Palliative care consulted 9/23 Na increased >> restart DDAVP and increase free water 9/25 Plan for trach. Increased pressor requirement. Added midodrine  9/26 D/c D5W. Weaning NE as able. On I/O cath protocol  9/27 Worse renal fxn and new hyperkalemia  9/30 Renal function improving 10/5 Off levo 10/6 Plan for PEG tube 10/7 BPs elevated in AM, monitoring 10/17 PEG deferred in the setting of severe hypernatremia (173), DI. 10/18 No significant neurologic change. Na improved to 152, remains on DDAVP with FWF. Weaning NE as able, BP remains labile. Midodrine initiated. 10/19 No significant neurologic change. DDAVP discontinued. BP improved with addition of midodrine, weaning NE. Increased midodrine to '15mg'$   TID. 10/20 Cortrak clogged this morning, awaiting PEG placement. Na 125 (134), discontinued D5W and FWF. 10/22 Fever spiked, POD1 from PEG  11/1 Persistent anemia of chronic disease.  Patient's family agreed to transfusion today.  11/2 Hypotensive, hypothermic started on vanc/cefepime, low dose levo 11/3 Steroid dose increased, 5% 25g albumin given, off pressor 11/4 RUE picc removed, mid-line placed  11/8 Continuing to work on facility placement without success  11/10 [place] talk w husband and son 11/15 No significant change in neurologic exam. CBC stable, Na 150 (downtrending). Remainder of labs stable. 11/16 No change in neuro exam. Na 142 (150). Hydralazine PRN overnight for elevated BP. 11/17 Unchanged clinical exam. Na 147 (142). BP better controlled. Trach change today. 11/24 no change in mental status.  Family was supposed to make decision on goals of care after Thanksgiving but have deferred to next week 11/25 Started Norvasc for hypertension  Interim History / Subjective:   No change clinically   Objective:  Blood pressure 126/85, pulse 69, temperature (!) 97.5 F (36.4 C), resp. rate 18, height '5\' 2"'$  (1.575 m), weight 78.7 kg, SpO2 98 %.    Vent Mode: Other (Comment) FiO2 (%):  [40 %] 40 % Set Rate:  [18 bmp] 18 bmp Vt Set:  [400 mL] 400 mL PEEP:  [5 cmH20] 5 cmH20 Plateau Pressure:  [18 cmH20-22 cmH20] 18 cmH20   Intake/Output Summary (Last 24 hours) at 12/23/2021 0718 Last data filed at 12/23/2021 0600 Gross per 24 hour  Intake 1360 ml  Output 2250 ml  Net -890 ml    Filed Weights   12/21/21 0500 12/22/21 0357 12/23/21 0346  Weight:  79 kg 79 kg 78.7 kg   Physical Examination: General:  critically ill appearing on mech vent HEENT: MM pink/moist; trach in place; swollen tongue Neuro: comatose, pupils fixed and dilated CV: s1s2, RRR, no m/r/g PULM:  dim clear BS bilaterally; trached on mech vent PRVC GI: soft, bsx4 active  Extremities: warm/dry, no edema   Skin: no rashes or lesions appreciated  Resolved Hospital Issues  Hypokalemia, resolved Aspiration pneumonia VAP with sepsis -- stenotrophomonas maltophilia   Assessment & Plan:   Acute on chronic respiratory failure Tracheostomy in place P: -LTVV strategy with tidal volumes of 6-8 cc/kg ideal body weight -Wean PEEP/FiO2 for SpO2 >92% -trach care per protocol -VAP bundle in place  Subarachnoid hemorrhage and cardiac arrest with subsequent neurologic devastation: P: -poor prognosis -supportive care   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 worse with Na containing fluids and lasix and decrease in free water P: -trend bmp -continue ddavp and holding free water   Anemia of chronic disease and blood loss anemia in the setting of recurrent phlebotomy: P: -trend H/H -reduce lab draws when able -Blood products refused in the past per family  AKI P: -creat continue to rise; give IV fluids -Trend BMP / urinary output -Replace electrolytes as indicated -Avoid nephrotoxic agents, ensure adequate renal perfusion  Adrenal insufficiency: P: -continue hydrocortisone; consider tapering   Hypertension P: -continue norvasc  -Continue hydralazine PRN    Best Practice: (right click and "Reselect all SmartList Selections" daily)  Diet/type: tubefeeds and NPO w/ meds via tube  DVT prophylaxis: prophylactic heparin  GI prophylaxis: PPI Lines: N/A  Foley:  N/A Code Status: Full Code GOC: 11/28 spoke with husband at bedside; palliative care following for [place] conversations  Signature:   JD Geryl [facility] 12/23/2021, 7:18 AM  Please see [email or web address] for pager details.  From 7A-7P if no response, please call [phone]. After hours, please call ELink [phone].

## 2021-12-24 DIAGNOSIS — N179 Acute kidney failure, unspecified: Secondary | ICD-10-CM | POA: Diagnosis not present

## 2021-12-24 DIAGNOSIS — J9611 Chronic respiratory failure with hypoxia: Secondary | ICD-10-CM | POA: Diagnosis not present

## 2021-12-24 DIAGNOSIS — I609 Nontraumatic subarachnoid hemorrhage, unspecified: Secondary | ICD-10-CM | POA: Diagnosis not present

## 2021-12-24 NOTE — Progress Notes (Signed)
Patient ID: [name], female   DOB: 01-11-1976, 46 y.o.   MRN: [id number] BP (!) 168/104   Pulse 83   Temp (!) 96.6 F (35.9 C) (Axillary) Comment: Reported to the nurse, bairhugger applied  Resp 20   Ht '5\' 2"'$  (1.575 m)   Wt 74.9 kg   SpO2 97%   BMI 30.20 kg/m  Comatose. No cough, no gag, no corneal reflex, no oculocephalic reflex Family is discussing [name]'s situation

## 2021-12-24 NOTE — Progress Notes (Signed)
NAME:  [name], MRN:  [id number], DOB:  09/08/1975, LOS: 64 ADMISSION DATE:  10/03/2021, CONSULTATION DATE: 10/10/2021 REFERRING MD: EDP, CHIEF COMPLAINT: Subarachnoid hemorrhage  History of Present Illness:  46 yo female former smoker was found unresponsive by her husband and EMS called.  She was making gurgling noises with her breathing.  She was found to have SVT and EMS performed DCCV.  She had GCS 4 in ER and intubated for airway protection.  Found to have large SAH and neurosurgery arranged for hospital admission.  PCCM consulted to assist with management in ICU.  Pertinent Medical History:  Hypertension, uterine fibroids Legally blind in left eye  Significant Hospital Events: Including procedures, antibiotic start and stop dates in addition to other pertinent events   9/07 Presented unresponsive, hypotensive; CT head with subarachnoid hemorrhage 9/11 DDAVP give for Central DI, repeat head CT with worsening infarcts and herniation 9/12 Attempted transfer for second opinion at [facility] and [facility].  No beds at [facility] and transfer was declined at [facility] 9/13 No change in mental status.  Transfer to [facility] declined 9/14 [facility] declined transfer again.  No change.  DDAVP x 1 9/18 Palliative care consulted 9/23 Na increased >> restart DDAVP and increase free water 9/25 Plan for trach. Increased pressor requirement. Added midodrine  9/26 D/c D5W. Weaning NE as able. On I/O cath protocol  9/27 Worse renal fxn and new hyperkalemia  9/30 Renal function improving 10/5 Off levo 10/6 Plan for PEG tube 10/7 BPs elevated in AM, monitoring 10/17 PEG deferred in the setting of severe hypernatremia (173), DI. 10/18 No significant neurologic change. Na improved to 152, remains on DDAVP with FWF. Weaning NE as able, BP remains labile. Midodrine initiated. 10/19 No significant neurologic change. DDAVP discontinued. BP improved with addition of midodrine, weaning NE. Increased midodrine to '15mg'$   TID. 10/20 Cortrak clogged this morning, awaiting PEG placement. Na 125 (134), discontinued D5W and FWF. 10/22 Fever spiked, POD1 from PEG  11/1 Persistent anemia of chronic disease.  Patient's family agreed to transfusion today.  11/2 Hypotensive, hypothermic started on vanc/cefepime, low dose levo 11/3 Steroid dose increased, 5% 25g albumin given, off pressor 11/4 RUE picc removed, mid-line placed  11/8 Continuing to work on facility placement without success  11/10 [place] talk w husband and son 11/15 No significant change in neurologic exam. CBC stable, Na 150 (downtrending). Remainder of labs stable. 11/16 No change in neuro exam. Na 142 (150). Hydralazine PRN overnight for elevated BP. 11/17 Unchanged clinical exam. Na 147 (142). BP better controlled. Trach change today. 11/24 no change in mental status.  Family was supposed to make decision on goals of care after Thanksgiving but have deferred to next week 11/25 Started Norvasc for hypertension  Interim History / Subjective:   No change clinically   Objective:  Blood pressure (!) 155/100, pulse 86, temperature 98.8 F (37.1 C), temperature source Axillary, resp. rate 18, height '5\' 2"'$  (1.575 m), weight 74.9 kg, SpO2 99 %.    Vent Mode: Other (Comment) FiO2 (%):  [40 %] 40 % Set Rate:  [18 bmp] 18 bmp Vt Set:  [400 mL] 400 mL PEEP:  [5 cmH20] 5 cmH20 Plateau Pressure:  [16 cmH20-18 cmH20] 17 cmH20   Intake/Output Summary (Last 24 hours) at 12/24/2021 0721 Last data filed at 12/24/2021 0700 Gross per 24 hour  Intake 1961.86 ml  Output 1500 ml  Net 461.86 ml    Filed Weights   12/22/21 0357 12/23/21 0346 12/24/21  0434  Weight: 79 kg 78.7 kg 74.9 kg   Physical Examination: General:  critically ill appearing on mech vent HEENT: MM pink/moist; trach in place; swollen tongue Neuro: comatose, pupils fixed and dilated CV: s1s2, RRR, no m/r/g PULM:  dim clear BS bilaterally; trached on mech vent PRVC GI: soft, bsx4 active   Extremities: warm/dry, no edema  Skin: no rashes or lesions appreciated  Resolved Hospital Issues  Hypokalemia, resolved Aspiration pneumonia VAP with sepsis -- stenotrophomonas maltophilia   Assessment & Plan:   Acute on chronic respiratory failure Tracheostomy in place P: -LTVV strategy with tidal volumes of 6-8 cc/kg ideal body weight -Wean PEEP/FiO2 for SpO2 >92% -trach care per protocol -VAP bundle in place  Subarachnoid hemorrhage and cardiac arrest with subsequent neurologic devastation: P: -poor prognosis -supportive care   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 worse with Na containing fluids and lasix and decrease in free water P: -trend bmp every other day -continue ddavp and holding free water for now   Anemia of chronic disease and blood loss anemia in the setting of recurrent phlebotomy: P: -trend H/H every other day -reduce lab draws when able -Blood products refused in the past per family  AKI P: -Trend BMP / urinary output -Replace electrolytes as indicated -Avoid nephrotoxic agents, ensure adequate renal perfusion  Adrenal insufficiency: P: -continue hydrocortisone; consider tapering   Hypertension P: -continue norvasc  -Continue hydralazine PRN    Best Practice: (right click and "Reselect all SmartList Selections" daily)  Diet/type: tubefeeds and NPO w/ meds via tube  DVT prophylaxis: prophylactic heparin  GI prophylaxis: PPI Lines: N/A  Foley:  N/A Code Status: Full Code GOC: 11/29 spoke with husband at bedside regarding [place], he states that he is waiting for his son to talk to his side of the family, husband doesn't want wife to suffer and is ready to do comfort but wants to wait on son's side of family to come to a decision; palliative care following for [place] conversations  Signature:   JD Rollene Rotunda, PA-C [place] [facility] 12/24/2021, 7:21 AM  Please see [email or web address] for pager  details.  From 7A-7P if no response, please call [phone]. After hours, please call ELink [phone].

## 2021-12-25 DIAGNOSIS — S066X9A Traumatic subarachnoid hemorrhage with loss of consciousness of unspecified duration, initial encounter: Secondary | ICD-10-CM | POA: Diagnosis not present

## 2021-12-25 LAB — BASIC METABOLIC PANEL
Anion gap: 13 (ref 5–15)
Anion gap: 14 (ref 5–15)
Anion gap: 7 (ref 5–15)
BUN: 68 mg/dL — ABNORMAL HIGH (ref 6–20)
BUN: 70 mg/dL — ABNORMAL HIGH (ref 6–20)
BUN: 69 mg/dL — ABNORMAL HIGH (ref 6–20)
CO2: 23 mmol/L (ref 22–32)
CO2: 24 mmol/L (ref 22–32)
CO2: 24 mmol/L (ref 22–32)
Calcium: 8.9 mg/dL (ref 8.9–10.3)
Calcium: 9 mg/dL (ref 8.9–10.3)
Calcium: 8.8 mg/dL — ABNORMAL LOW (ref 8.9–10.3)
Chloride: 111 mmol/L (ref 98–111)
Chloride: 112 mmol/L — ABNORMAL HIGH (ref 98–111)
Chloride: 117 mmol/L — ABNORMAL HIGH (ref 98–111)
Creatinine, Ser: 2.7 mg/dL — ABNORMAL HIGH (ref 0.44–1.00)
Creatinine, Ser: 2.72 mg/dL — ABNORMAL HIGH (ref 0.44–1.00)
Creatinine, Ser: 2.6 mg/dL — ABNORMAL HIGH (ref 0.44–1.00)
GFR, Estimated: 21 mL/min — ABNORMAL LOW (ref >60)
GFR, Estimated: 21 mL/min — ABNORMAL LOW (ref >60)
GFR, Estimated: 22 mL/min — ABNORMAL LOW (ref >60)
Glucose, Bld: 92 mg/dL (ref 70–99)
Glucose, Bld: 94 mg/dL (ref 70–99)
Glucose, Bld: 86 mg/dL (ref 70–99)
Potassium: 6.2 mmol/L — ABNORMAL HIGH (ref 3.5–5.1)
Potassium: 6.4 mmol/L (ref 3.5–5.1)
Potassium: 5.9 mmol/L — ABNORMAL HIGH (ref 3.5–5.1)
Sodium: 147 mmol/L — ABNORMAL HIGH (ref 135–145)
Sodium: 150 mmol/L — ABNORMAL HIGH (ref 135–145)
Sodium: 148 mmol/L — ABNORMAL HIGH (ref 135–145)

## 2021-12-25 LAB — CBC
HCT: 34.1 % — ABNORMAL LOW (ref 36.0–46.0)
Hemoglobin: 10.2 g/dL — ABNORMAL LOW (ref 12.0–15.0)
MCH: 31.4 pg (ref 26.0–34.0)
MCHC: 29.9 g/dL — ABNORMAL LOW (ref 30.0–36.0)
MCV: 104.9 fL — ABNORMAL HIGH (ref 80.0–100.0)
Platelets: 147 10*3/uL — ABNORMAL LOW (ref 150–400)
RBC: 3.25 MIL/uL — ABNORMAL LOW (ref 3.87–5.11)
RDW: 20.6 % — ABNORMAL HIGH (ref 11.5–15.5)
WBC: 4.8 10*3/uL (ref 4.0–10.5)
nRBC: 0 % (ref 0.0–0.2)

## 2021-12-25 MED ORDER — SODIUM ZIRCONIUM CYCLOSILICATE 5 G PO PACK
5.0000 g | PACK | Freq: Once | ORAL | Status: AC
  Administered 2021-12-25: 5 g
  Filled 2021-12-25: qty 1

## 2021-12-25 MED ORDER — SODIUM ZIRCONIUM CYCLOSILICATE 10 G PO PACK
10.0000 g | PACK | Freq: Once | ORAL | Status: AC
  Administered 2021-12-25: 10 g
  Filled 2021-12-25: qty 1

## 2021-12-25 MED ORDER — FREE WATER
200.0000 mL | Freq: Three times a day (TID) | Status: DC
  Administered 2021-12-25 – 2021-12-28 (×9): 200 mL

## 2021-12-25 NOTE — Progress Notes (Signed)
Nutrition Follow-up  DOCUMENTATION CODES:   Not applicable  INTERVENTION:   Continue TF via Cortrak tube: Osmolite 1.5 at 50 ml/h (1200 ml per day) Prosource TF20 60 ml daily   Provides 1880 kcal, 95 gm protein, 912 ml free water daily    Free water, 200 ml every 4 hours  Total free water: 2112 ml   Banatrol TF via tube BID, each serving provides 45 kcal and 5 gm soluble fiber.   NUTRITION DIAGNOSIS:   Inadequate oral intake related to inability to eat as evidenced by NPO status. Ongoing  GOAL:   Patient will meet greater than or equal to 90% of their needs Goal met via TF  MONITOR:   TF tolerance  REASON FOR ASSESSMENT:   Consult Enteral/tube feeding initiation and management  ASSESSMENT:   Pt with PMH of HTN and legally blind in L eye admitted after being found down by husband with CPR. Per CT pt with large SAH.  Pt discussed during ICU rounds and with RN and CCM NP.   PEG placed 10/20. Currently receiving Osmolite 1.5 at 50 ml/h with Prosource TF20 60 ml once daily. Tolerating well.   Patient remains intubated on ventilator support via trach.   Medications: pepcid, Senokot-s    Labs: sodium 150, K 6.4 (given lokelma)   moderate edema present per RN assessment.  Diet Order:   Diet Order             Diet NPO time specified Except for: Sips with Meds  Diet effective midnight                   EDUCATION NEEDS:   No education needs have been identified at this time  Skin:  Skin Assessment:  (skin tear L thigh and R buttocks)  Last BM:  11/29  Height:   Ht Readings from Last 1 Encounters:  10/07/21 _0  (1.575 m)    Weight:   Wt Readings from Last 1 Encounters:  12/25/21 74.9 kg   BMI:  Body mass index is 30.2 kg/m.  Estimated Nutritional Needs:   Kcal:  1700-1900  Protein:  85-100 grams  Fluid:  >1.7 L/day  Lockie Pares., RD, LDN, CNSC See AMiON for contact information ]

## 2021-12-25 NOTE — Progress Notes (Addendum)
NAME:  NIEMAH SCHWEBKE, MRN:  [id number], DOB:  1975/12/14, LOS: 29 ADMISSION DATE:  10/20/2021, CONSULTATION DATE: 10/09/2021 REFERRING MD: EDP, CHIEF COMPLAINT: Subarachnoid hemorrhage  History of Present Illness:  46 yo female former smoker was found unresponsive by her husband and EMS called.  She was making gurgling noises with her breathing.  She was found to have SVT and EMS performed DCCV.  She had GCS 4 in ER and intubated for airway protection.  Found to have large SAH and neurosurgery arranged for hospital admission.  PCCM consulted to assist with management in ICU.  Pertinent Medical History:  Hypertension, uterine fibroids Legally blind in left eye  Significant Hospital Events: Including procedures, antibiotic start and stop dates in addition to other pertinent events   9/07 Presented unresponsive, hypotensive; CT head with subarachnoid hemorrhage 9/11 DDAVP give for Central DI, repeat head CT with worsening infarcts and herniation 9/12 Attempted transfer for second opinion at [facility] and [facility].  No beds at [facility] and transfer was declined at [facility] 9/13 No change in mental status.  Transfer to [facility] declined 9/14 [facility] declined transfer again.  No change.  DDAVP x 1 9/18 Palliative care consulted 9/23 Na increased >> restart DDAVP and increase free water 9/25 Plan for trach. Increased pressor requirement. Added midodrine  9/26 D/c D5W. Weaning NE as able. On I/O cath protocol  9/27 Worse renal fxn and new hyperkalemia  9/30 Renal function improving 10/5 Off levo 10/6 Plan for PEG tube 10/7 BPs elevated in AM, monitoring 10/17 PEG deferred in the setting of severe hypernatremia (173), DI. 10/18 No significant neurologic change. Na improved to 152, remains on DDAVP with FWF. Weaning NE as able, BP remains labile. Midodrine initiated. 10/19 No significant neurologic change. DDAVP discontinued. BP improved with addition of midodrine, weaning NE. Increased midodrine to '15mg'$   TID. 10/20 Cortrak clogged this morning, awaiting PEG placement. Na 125 (134), discontinued D5W and FWF. 10/22 Fever spiked, POD1 from PEG  11/1 Persistent anemia of chronic disease.  Patient's family agreed to transfusion today.  11/2 Hypotensive, hypothermic started on vanc/cefepime, low dose levo 11/3 Steroid dose increased, 5% 25g albumin given, off pressor 11/4 RUE picc removed, mid-line placed  11/8 Continuing to work on facility placement without success  11/10 [employer] talk w husband and son 11/15 No significant change in neurologic exam. CBC stable, Na 150 (downtrending). Remainder of labs stable. 11/16 No change in neuro exam. Na 142 (150). Hydralazine PRN overnight for elevated BP. 11/17 Unchanged clinical exam. Na 147 (142). BP better controlled. Trach change today. 11/24 no change in mental status.  Family was supposed to make decision on goals of care after Thanksgiving but have deferred to next week 11/25 Started Norvasc for hypertension 11/30 lokelma for K 6.2   Interim History / Subjective:   Worse AKI Hyperkalemic and hypernatremic -- repeat BMP ordered  No neuro change  Objective:  Blood pressure 111/75, pulse 74, temperature 98.3 F (36.8 C), temperature source Axillary, resp. rate 18, height '5\' 2"'$  (1.575 m), weight 74.9 kg, SpO2 97 %.    Vent Mode: Other (Comment) FiO2 (%):  [40 %] 40 % Set Rate:  [18 bmp] 18 bmp Vt Set:  [400 mL] 400 mL PEEP:  [5 cmH20] 5 cmH20 Plateau Pressure:  [16 cmH20-18 cmH20] 16 cmH20   Intake/Output Summary (Last 24 hours) at 12/25/2021 1225 Last data filed at 12/25/2021 1200 Gross per 24 hour  Intake 850 ml  Output 1350 ml  Net -  500 ml   Filed Weights   12/23/21 0346 12/24/21 0434 12/25/21 0500  Weight: 78.7 kg 74.9 kg 74.9 kg   Physical Examination: General:  critically ill middle aged F  HEENT: Protuberant tongue, wrapped in gauze. Trach secure. 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 worse with Na containing fluids and lasix and decrease in free water P: -trend bmp every other day -continue ddavp  -adding back free water 11/30 with HyperNa, worse AKI    Anemia of chronic disease and critical illness  P: -trend H/H every other day -reduce lab draws when able  AKI Hyperkalemia P: -lokelma   Adrenal insufficiency, relative  P: -continue hydrocortisone  Hypertension P: -continue norvasc  -Continue hydralazine PRN    Best Practice: (right click and "Reselect all SmartList Selections" daily)  Diet/type: tubefeeds and NPO w/ meds via tube  DVT prophylaxis: prophylactic heparin  GI prophylaxis: PPI Lines: N/A  Foley:  N/A Code Status: Full Code GOC: 11/29 spoke with husband at bedside regarding [place], he states that he is waiting for his son to talk to his side of the family, husband doesn't want wife to suffer and is ready to do comfort but wants to wait on son's side of family to come to a decision; palliative care following for [place] conversations    Eliseo Gum MSN, AGACNP-BC [place] for pager  12/25/2021, 12:25 PM

## 2021-12-25 NOTE — TOC Progression Note (Signed)
Transition of Care [facility]) - Progression Note    Patient Details  Name: [name] MRN: [id number] Date of Birth: 16-Jul-1975  Transition of Care [facility]) CM/SW [place], LCSW Phone Number: 12/25/2021, 1:29 PM  Clinical Narrative:    CSW heard back from [facility] rehab and sent updated clinicals as requested.    Expected Discharge Plan: Skilled Nursing Facility Barriers to Discharge: Ship broker, SNF Pending bed offer  Expected Discharge Plan and Services Expected Discharge Plan: [place] In-house Referral: Clinical Social Work, Hospice / Palliative Care Discharge Planning Services: CM Consult Post Acute Care Choice: [place] Living arrangements for the past 2 months: Single Family Home                                       Social Determinants of Health (SDOH) Interventions    Readmission Risk Interventions     No data to display

## 2021-12-25 NOTE — Progress Notes (Signed)
Patient ID: [name], female   DOB: 08/09/1975, 46 y.o.   MRN: [id number] BP (!) 137/91   Pulse 72   Temp (!) 97.5 F (36.4 C) (Oral)   Resp 18   Ht '5\' 2"'$  (1.575 m)   Wt 74.9 kg   SpO2 98%   BMI 30.20 kg/m  Comatose, no neurological changes Family in discussion about placement

## 2021-12-26 DIAGNOSIS — Z930 Tracheostomy status: Secondary | ICD-10-CM | POA: Diagnosis not present

## 2021-12-26 DIAGNOSIS — J9601 Acute respiratory failure with hypoxia: Secondary | ICD-10-CM | POA: Diagnosis not present

## 2021-12-26 DIAGNOSIS — I609 Nontraumatic subarachnoid hemorrhage, unspecified: Secondary | ICD-10-CM | POA: Diagnosis not present

## 2021-12-26 DIAGNOSIS — E875 Hyperkalemia: Secondary | ICD-10-CM | POA: Diagnosis not present

## 2021-12-26 LAB — BASIC METABOLIC PANEL
Anion gap: 6 (ref 5–15)
BUN: 71 mg/dL — ABNORMAL HIGH (ref 6–20)
CO2: 25 mmol/L (ref 22–32)
Calcium: 8.7 mg/dL — ABNORMAL LOW (ref 8.9–10.3)
Chloride: 117 mmol/L — ABNORMAL HIGH (ref 98–111)
Creatinine, Ser: 2.54 mg/dL — ABNORMAL HIGH (ref 0.44–1.00)
GFR, Estimated: 23 mL/min — ABNORMAL LOW (ref >60)
Glucose, Bld: 97 mg/dL (ref 70–99)
Potassium: 5.7 mmol/L — ABNORMAL HIGH (ref 3.5–5.1)
Sodium: 148 mmol/L — ABNORMAL HIGH (ref 135–145)

## 2021-12-26 MED ORDER — SODIUM ZIRCONIUM CYCLOSILICATE 5 G PO PACK
5.0000 g | PACK | Freq: Once | ORAL | Status: AC
  Administered 2021-12-26: 5 g
  Filled 2021-12-26: qty 1

## 2021-12-26 MED ORDER — HYDROCORTISONE 5 MG PO TABS
5.0000 mg | ORAL_TABLET | Freq: Two times a day (BID) | ORAL | Status: DC
  Administered 2021-12-26 – 2021-12-29 (×6): 5 mg
  Filled 2021-12-26 (×7): qty 1

## 2021-12-26 NOTE — Progress Notes (Signed)
Patient ID: [name], female   DOB: 03/08/1975, 46 y.o.   MRN: [id number] BP (!) 146/97   Pulse 81   Temp 98.9 F (37.2 C) (Axillary)   Resp 19   Ht '5\' 2"'$  (1.575 m)   Wt 74.9 kg   SpO2 98%   BMI 30.20 kg/m  Comatose. No cough, no gag Pupils fixed no response to light dilated. No corneal reflex, no oculocephalic reflex No neurological change. Prognosis grim.

## 2021-12-26 NOTE — Progress Notes (Signed)
Pt trach change on hold per CCM. Vitals stable, no increased WOB noted, RT will monitor.

## 2021-12-26 NOTE — Progress Notes (Signed)
NAME:  [name], MRN:  [id number], DOB:  08/03/1975, LOS: 18 ADMISSION DATE:  10/17/2021, CONSULTATION DATE: 10/19/2021 REFERRING MD: EDP, CHIEF COMPLAINT: Subarachnoid hemorrhage  History of Present Illness:  46 yo female former smoker was found unresponsive by her husband and EMS called.  She was making gurgling noises with her breathing.  She was found to have SVT and EMS performed DCCV.  She had GCS 4 in ER and intubated for airway protection.  Found to have large SAH and neurosurgery arranged for hospital admission.  PCCM consulted to assist with management in ICU.  Pertinent Medical History:  Hypertension, uterine fibroids Legally blind in left eye  Significant Hospital Events: Including procedures, antibiotic start and stop dates in addition to other pertinent events   9/07 Presented unresponsive, hypotensive; CT head with subarachnoid hemorrhage 9/11 DDAVP give for Central DI, repeat head CT with worsening infarcts and herniation 9/12 Attempted transfer for second opinion at [facility] and [facility].  No beds at [facility] and transfer was declined at [facility] 9/13 No change in mental status.  Transfer to [facility] declined 9/14 [facility] declined transfer again.  No change.  DDAVP x 1 9/18 Palliative care consulted 9/23 Na increased >> restart DDAVP and increase free water 9/25 Plan for trach. Increased pressor requirement. Added midodrine  9/26 D/c D5W. Weaning NE as able. On I/O cath protocol  9/27 Worse renal fxn and new hyperkalemia  9/30 Renal function improving 10/5 Off levo 10/6 Plan for PEG tube 10/7 BPs elevated in AM, monitoring 10/17 PEG deferred in the setting of severe hypernatremia (173), DI. 10/18 No significant neurologic change. Na improved to 152, remains on DDAVP with FWF. Weaning NE as able, BP remains labile. Midodrine initiated. 10/19 No significant neurologic change. DDAVP discontinued. BP improved with addition of midodrine, weaning NE. Increased midodrine to '15mg'$   TID. 10/20 Cortrak clogged this morning, awaiting PEG placement. Na 125 (134), discontinued D5W and FWF. 10/22 Fever spiked, POD1 from PEG  11/1 Persistent anemia of chronic disease.  Patient's family agreed to transfusion today.  11/2 Hypotensive, hypothermic started on vanc/cefepime, low dose levo 11/3 Steroid dose increased, 5% 25g albumin given, off pressor 11/4 RUE picc removed, mid-line placed  11/8 Continuing to work on facility placement without success  11/10 [place] talk w husband and son 11/15 No significant change in neurologic exam. CBC stable, Na 150 (downtrending). Remainder of labs stable. 11/16 No change in neuro exam. Na 142 (150). Hydralazine PRN overnight for elevated BP. 11/17 Unchanged clinical exam. Na 147 (142). BP better controlled. Trach change today. 11/24 no change in mental status.  Family was supposed to make decision on goals of care after Thanksgiving but have deferred to next week 11/25 Started Norvasc for hypertension 11/30 lokelma for K 6.2   Interim History / Subjective:   Worse AKI Hyperkalemic and hypernatremic -- repeat BMP ordered  No neuro change  Objective:  Blood pressure (!) 153/95, pulse 79, temperature 98 F (36.7 C), temperature source Axillary, resp. rate 18, height '5\' 2"'$  (1.575 m), weight 74.9 kg, SpO2 98 %.    Vent Mode: AC FiO2 (%):  [40 %] 40 % Set Rate:  [18 bmp] 18 bmp Vt Set:  [400 mL] 400 mL PEEP:  [5 cmH20] 5 cmH20 Plateau Pressure:  [10 cmH20-17 cmH20] 16 cmH20   Intake/Output Summary (Last 24 hours) at 12/26/2021 0946 Last data filed at 12/26/2021 0800 Gross per 24 hour  Intake 1500 ml  Output 1100 ml  Net  400 ml   Filed Weights   12/23/21 0346 12/24/21 0434 12/25/21 0500  Weight: 78.7 kg 74.9 kg 74.9 kg   Physical Examination: General:  critically ill middle aged F  HEENT: Protuberant tongue, wrapped in gauze. Trach secure. Anicteric sclera  Neuro: Does not follow commands  CV: rr s1s2  PULM:  mechanically  ventilated  GI: soft ndnt  Extremities: edematous    Resolved Hospital Issues  Hypokalemia, resolved Aspiration pneumonia VAP with sepsis -- stenotrophomonas maltophilia   Assessment & Plan:   Acute on chronic respiratory failure Tracheostomy dependent Ventilator dependent  P: -routine trach care -VAP pulm hygiene   SAH with brain compression OOH cardiac arrest due to above  Central DI, hypernatremia  P: -poor prognosis -supportive care -continue ddavp  -cont FWF    Anemia of critical illness, chronic dz  P: -PRN CBC  AKI Hyperkalemia P: -lokelma -FWF   Adrenal insufficiency, relative  P: -wean hydrocort to 5 BID (12/1)  HTN P: -continue norvasc , PRN hydral     Best Practice: (right click and "Reselect all SmartList Selections" daily)  Diet/type: tubefeeds and NPO w/ meds via tube  DVT prophylaxis: prophylactic heparin  GI prophylaxis: PPI Lines: N/A  Foley:  N/A Code Status: Full Code GOC: 11/29 spoke with husband at bedside regarding [name], he states that he is waiting for his son to talk to his side of the family, husband doesn't want wife to suffer and is ready to do comfort but wants to wait on son's side of family to come to a decision; palliative care following for [place] conversations  CCT: n/a  Eliseo Gum MSN, AGACNP-BC [place] for pager  12/26/2021, 9:46 AM

## 2021-12-26 NOTE — TOC Progression Note (Signed)
Transition of Care [facility]) - Progression Note    Patient Details  Name: LATANDRA LOUREIRO MRN: [id number] Date of Birth: 10-12-1975  Transition of Care [facility]) CM/SW [place], LCSW Phone Number: 12/26/2021, 5:07 PM  Clinical Narrative:    CSW spoke with Admissions at [facility]. She stated they will be able to accept patient but require that her family contact Medicaid to apply for long term Medicaid prior to admitting patient.   CSW spoke with patient's spouse and explained available bed at [facility] in [place] and requirement to contact Medicaid to apply for ltc. He reported understanding and stated that he will need to speak with patient's son to explain it to him and agreed to talk again with CSW on Monday for next steps.    Expected Discharge Plan: Skilled Nursing Facility Barriers to Discharge: Ship broker, SNF Pending bed offer  Expected Discharge Plan and Services Expected Discharge Plan: [place] In-house Referral: Clinical Social Work, Hospice / Palliative Care Discharge Planning Services: CM Consult Post Acute Care Choice: [place] Living arrangements for the past 2 months: Single Family Home                                       Social Determinants of Health (SDOH) Interventions    Readmission Risk Interventions     No data to display

## 2021-12-27 DIAGNOSIS — I609 Nontraumatic subarachnoid hemorrhage, unspecified: Secondary | ICD-10-CM | POA: Diagnosis not present

## 2021-12-27 LAB — BASIC METABOLIC PANEL
Anion gap: 8 (ref 5–15)
BUN: 72 mg/dL — ABNORMAL HIGH (ref 6–20)
CO2: 22 mmol/L (ref 22–32)
Calcium: 8.8 mg/dL — ABNORMAL LOW (ref 8.9–10.3)
Chloride: 118 mmol/L — ABNORMAL HIGH (ref 98–111)
Creatinine, Ser: 2.6 mg/dL — ABNORMAL HIGH (ref 0.44–1.00)
GFR, Estimated: 22 mL/min — ABNORMAL LOW (ref >60)
Glucose, Bld: 91 mg/dL (ref 70–99)
Potassium: 5.4 mmol/L — ABNORMAL HIGH (ref 3.5–5.1)
Sodium: 148 mmol/L — ABNORMAL HIGH (ref 135–145)

## 2021-12-27 MED ORDER — SODIUM ZIRCONIUM CYCLOSILICATE 5 G PO PACK
5.0000 g | PACK | Freq: Once | ORAL | Status: AC
  Administered 2021-12-27: 5 g
  Filled 2021-12-27: qty 1

## 2021-12-27 NOTE — Progress Notes (Signed)
NAME:  [name], MRN:  [id number], DOB:  10/20/1975, LOS: 50 ADMISSION DATE:  10/23/2021, CONSULTATION DATE: 10/16/2021 REFERRING MD: EDP, CHIEF COMPLAINT: Subarachnoid hemorrhage  History of Present Illness:  46 yo female former smoker was found unresponsive by her husband and EMS called.  She was making gurgling noises with her breathing.  She was found to have SVT and EMS performed DCCV.  She had GCS 4 in ER and intubated for airway protection.  Found to have large SAH and neurosurgery arranged for hospital admission.  PCCM consulted to assist with management in ICU.  Pertinent Medical History:  Hypertension, uterine fibroids Legally blind in left eye  Significant Hospital Events: Including procedures, antibiotic start and stop dates in addition to other pertinent events   9/07 Presented unresponsive, hypotensive; CT head with subarachnoid hemorrhage 9/11 DDAVP give for Central DI, repeat head CT with worsening infarcts and herniation 9/12 Attempted transfer for second opinion at [facility] and [facility].  No beds at [facility] and transfer was declined at [facility] 9/13 No change in mental status.  Transfer to [facility] declined 9/14 [facility] declined transfer again.  No change.  DDAVP x 1 9/18 Palliative care consulted 9/23 Na increased >> restart DDAVP and increase free water 9/25 Plan for trach. Increased pressor requirement. Added midodrine  9/26 D/c D5W. Weaning NE as able. On I/O cath protocol  9/27 Worse renal fxn and new hyperkalemia  9/30 Renal function improving 10/5 Off levo 10/6 Plan for PEG tube 10/7 BPs elevated in AM, monitoring 10/17 PEG deferred in the setting of severe hypernatremia (173), DI. 10/18 No significant neurologic change. Na improved to 152, remains on DDAVP with FWF. Weaning NE as able, BP remains labile. Midodrine initiated. 10/19 No significant neurologic change. DDAVP discontinued. BP improved with addition of midodrine, weaning NE. Increased midodrine to '15mg'$   TID. 10/20 Cortrak clogged this morning, awaiting PEG placement. Na 125 (134), discontinued D5W and FWF. 10/22 Fever spiked, POD1 from PEG  11/1 Persistent anemia of chronic disease.  Patient's family agreed to transfusion today.  11/2 Hypotensive, hypothermic started on vanc/cefepime, low dose levo 11/3 Steroid dose increased, 5% 25g albumin given, off pressor 11/4 RUE picc removed, mid-line placed  11/8 Continuing to work on facility placement without success  11/10 [place] talk w husband and son 11/15 No significant change in neurologic exam. CBC stable, Na 150 (downtrending). Remainder of labs stable. 11/16 No change in neuro exam. Na 142 (150). Hydralazine PRN overnight for elevated BP. 11/17 Unchanged clinical exam. Na 147 (142). BP better controlled. Trach change today. 11/24 no change in mental status.  Family was supposed to make decision on goals of care after Thanksgiving but have deferred to next week 11/25 Started Norvasc for hypertension 11/30 lokelma for K 6.2 FWF   Interim History / Subjective:   NAEO  K down trending w lokelma doses   Cr stable, elevated  Objective:  Blood pressure 117/72, pulse 76, temperature (!) 96.8 F (36 C), temperature source Axillary, resp. rate 18, height '5\' 2"'$  (1.575 m), weight 74.9 kg, SpO2 97 %.    Vent Mode: AC FiO2 (%):  [40 %] 40 % Set Rate:  [18 bmp] 18 bmp Vt Set:  [400 mL] 400 mL PEEP:  [5 cmH20] 5 cmH20 Plateau Pressure:  [15 cmH20-16 cmH20] 15 cmH20   Intake/Output Summary (Last 24 hours) at 12/27/2021 1119 Last data filed at 12/27/2021 1000 Gross per 24 hour  Intake 839.17 ml  Output 1150 ml  Net -310.83 ml   Filed Weights   12/23/21 0346 12/24/21 0434 12/25/21 0500  Weight: 78.7 kg 74.9 kg 74.9 kg   Physical Examination: General:  Critically ill comatose  HEENT: Trach. Protuberant edematous tongue  Neuro: Not following commands  CV:rr  PULM:  MV symmetriacl chest expansion  GI: soft ndnt  Extremities: edematous  BUE BLE    Resolved Hospital Issues  Hypokalemia, resolved Aspiration pneumonia VAP with sepsis -- stenotrophomonas maltophilia   Assessment & Plan:    Acute on chronic resp failure Trach/vent dependent  P: -routine trach care -VAP pulm hygiene   SAH w brain compression OOH arrest due to above Central DI, hypernatremia due to above  P: -poor prognosis -supportive care -continue ddavp  -cont FWF    Anemia of chronic dz and critical illness  P: -PRN CBC  AKI Hyperkalemia  P: -lokelma -cont FWF  -follow UOP  Adrenal insufficiency, relative  P: -wean hydrocort to 5 BID (12/1)  HTN P: -continue norvasc , PRN hydral     Best Practice: (right click and "Reselect all SmartList Selections" daily)  Diet/type: tubefeeds and NPO w/ meds via tube  DVT prophylaxis: prophylactic heparin  GI prophylaxis: PPI Lines: N/A  Foley:  N/A Code Status: Full Code GOC: 11/29 spoke with husband at bedside regarding [place], he states that he is waiting for his son to talk to his side of the family, husband doesn't want wife to suffer and is ready to do comfort but wants to wait on son's side of family to come to a decision; palliative care following for [place] conversations  CCT: n/a  Eliseo Gum MSN, AGACNP-BC [place] for pager  12/27/2021, 11:19 AM

## 2021-12-27 NOTE — Progress Notes (Signed)
No change in status.  Pupil still fixed and dilated.  No response to noxious stimuli.  Continue supportive efforts however prognosis for recovery is nil.

## 2021-12-28 DIAGNOSIS — I609 Nontraumatic subarachnoid hemorrhage, unspecified: Secondary | ICD-10-CM | POA: Diagnosis not present

## 2021-12-28 DIAGNOSIS — E875 Hyperkalemia: Secondary | ICD-10-CM | POA: Diagnosis not present

## 2021-12-28 DIAGNOSIS — N179 Acute kidney failure, unspecified: Secondary | ICD-10-CM | POA: Diagnosis not present

## 2021-12-28 LAB — BASIC METABOLIC PANEL
Anion gap: 8 (ref 5–15)
BUN: 80 mg/dL — ABNORMAL HIGH (ref 6–20)
CO2: 24 mmol/L (ref 22–32)
Calcium: 8.8 mg/dL — ABNORMAL LOW (ref 8.9–10.3)
Chloride: 117 mmol/L — ABNORMAL HIGH (ref 98–111)
Creatinine, Ser: 2.95 mg/dL — ABNORMAL HIGH (ref 0.44–1.00)
GFR, Estimated: 19 mL/min — ABNORMAL LOW (ref >60)
Glucose, Bld: 97 mg/dL (ref 70–99)
Potassium: 5.8 mmol/L — ABNORMAL HIGH (ref 3.5–5.1)
Sodium: 149 mmol/L — ABNORMAL HIGH (ref 135–145)

## 2021-12-28 LAB — CBC
HCT: 32.8 % — ABNORMAL LOW (ref 36.0–46.0)
Hemoglobin: 10.3 g/dL — ABNORMAL LOW (ref 12.0–15.0)
MCH: 32.6 pg (ref 26.0–34.0)
MCHC: 31.4 g/dL (ref 30.0–36.0)
MCV: 103.8 fL — ABNORMAL HIGH (ref 80.0–100.0)
Platelets: 152 10*3/uL (ref 150–400)
RBC: 3.16 MIL/uL — ABNORMAL LOW (ref 3.87–5.11)
RDW: 19.8 % — ABNORMAL HIGH (ref 11.5–15.5)
WBC: 5.3 10*3/uL (ref 4.0–10.5)
nRBC: 0 % (ref 0.0–0.2)

## 2021-12-28 LAB — GLUCOSE, CAPILLARY: Glucose-Capillary: 86 mg/dL (ref 70–99)

## 2021-12-28 MED ORDER — FREE WATER
200.0000 mL | Freq: Four times a day (QID) | Status: DC
  Administered 2021-12-28 – 2021-12-29 (×4): 200 mL

## 2021-12-28 MED ORDER — SODIUM ZIRCONIUM CYCLOSILICATE 10 G PO PACK
10.0000 g | PACK | Freq: Every day | ORAL | Status: DC
  Administered 2021-12-28 – 2021-12-29 (×2): 10 g
  Filled 2021-12-28 (×2): qty 1

## 2021-12-28 MED ORDER — RENA-VITE PO TABS
1.0000 | ORAL_TABLET | Freq: Every day | ORAL | Status: DC
  Administered 2021-12-29 – 2021-12-31 (×4): 1
  Filled 2021-12-28 (×6): qty 1

## 2021-12-28 MED ORDER — NEPRO/CARBSTEADY PO LIQD
1000.0000 mL | ORAL | Status: DC
  Administered 2021-12-28 – 2021-12-31 (×4): 1000 mL
  Filled 2021-12-28: qty 1000

## 2021-12-28 NOTE — Progress Notes (Signed)
No change in status overall.  Remains unconscious with minimal brainstem function.  No change in plan.  Continue supportive efforts.  Prognosis remains grim.

## 2021-12-28 NOTE — Progress Notes (Signed)
NUTRITION NOTE RD working remotely.  Page received from Pharmacist for patient who is receiving Osmolite 1.5 @ 50 ml/hr with 60 ml Prosource TF20 once/day and 200 ml water TID via PEG. This regimen is providing 1880 kcal, 95 grams protein, 1514 ml water (914 ml from Osmolite and 600 ml from [facility]), 2616 mg K, 1596 mg Na.  Patient most recently assessed in person by a RD on 12/25/21 at which time Na: 150 mmol/l and K: 6.4 mmol/l with lokelma being given.  Today serum Na: 149 mmol/l, and K: 5.8 mmol/l. Patient ordered 10 g lokelma per tube/day starting today.  Pharmacist shares concern about persistent hypernatremia and persistent hyperkalemia requiring scheduled daily lokelma. Request for adjustment to TF regimen, if feasible, to aid.   Will adjust TF regimen to Nepro @ 40 ml/hr with 60 ml Prosource TF20 once/day. This regimen will provide 1808 kcal, 98 grams protein, 698 ml water, 911 mg K (1705 mg difference), 1008 mg Na (588 mg difference).  Will add rena-vit once/day via PEG due to TF rate <42 mmol/l.  Could consider adjustment to free water flushes, but will defer to CCM and Neurosurgery.  Will communicate with RD team to follow-up in person next available date.     Jarome Matin, MS, RD, LDN, CNSC Clinical Dietitian PRN/Relief staff On-call/weekend pager # available in [facility]

## 2021-12-28 NOTE — Progress Notes (Signed)
NAME:  [name], MRN:  [id number], DOB:  Aug 21, 1975, LOS: 39 ADMISSION DATE:  10/23/2021, CONSULTATION DATE: 09/29/2021 REFERRING MD: EDP, CHIEF COMPLAINT: Subarachnoid hemorrhage  History of Present Illness:  46 yo female former smoker was found unresponsive by her husband and EMS called.  She was making gurgling noises with her breathing.  She was found to have SVT and EMS performed DCCV.  She had GCS 4 in ER and intubated for airway protection.  Found to have large SAH and neurosurgery arranged for hospital admission.  PCCM consulted to assist with management in ICU.  Pertinent Medical History:  Hypertension, uterine fibroids Legally blind in left eye  Significant Hospital Events: Including procedures, antibiotic start and stop dates in addition to other pertinent events   9/07 Presented unresponsive, hypotensive; CT head with subarachnoid hemorrhage 9/11 DDAVP give for Central DI, repeat head CT with worsening infarcts and herniation 9/12 Attempted transfer for second opinion at [facility] and [facility].  No beds at [facility] and transfer was declined at [facility] 9/13 No change in mental status.  Transfer to [facility] declined 9/14 [facility] declined transfer again.  No change.  DDAVP x 1 9/18 Palliative care consulted 9/23 Na increased >> restart DDAVP and increase free water 9/25 Plan for trach. Increased pressor requirement. Added midodrine  9/26 D/c D5W. Weaning NE as able. On I/O cath protocol  9/27 Worse renal fxn and new hyperkalemia  9/30 Renal function improving 10/5 Off levo 10/6 Plan for PEG tube 10/7 BPs elevated in AM, monitoring 10/17 PEG deferred in the setting of severe hypernatremia (173), DI. 10/18 No significant neurologic change. Na improved to 152, remains on DDAVP with FWF. Weaning NE as able, BP remains labile. Midodrine initiated. 10/19 No significant neurologic change. DDAVP discontinued. BP improved with addition of midodrine, weaning NE. Increased midodrine to '15mg'$   TID. 10/20 Cortrak clogged this morning, awaiting PEG placement. Na 125 (134), discontinued D5W and FWF. 10/22 Fever spiked, POD1 from PEG  11/1 Persistent anemia of chronic disease.  Patient's family agreed to transfusion today.  11/2 Hypotensive, hypothermic started on vanc/cefepime, low dose levo 11/3 Steroid dose increased, 5% 25g albumin given, off pressor 11/4 RUE picc removed, mid-line placed  11/8 Continuing to work on facility placement without success  11/10 [place] talk w husband and son 11/15 No significant change in neurologic exam. CBC stable, Na 150 (downtrending). Remainder of labs stable. 11/16 No change in neuro exam. Na 142 (150). Hydralazine PRN overnight for elevated BP. 11/17 Unchanged clinical exam. Na 147 (142). BP better controlled. Trach change today. 11/24 no change in mental status.  Family was supposed to make decision on goals of care after Thanksgiving but have deferred to next week 11/25 Started Norvasc for hypertension 11/30 lokelma for K 6.2 FWF   Interim History / Subjective:   NAEO  K down trending w lokelma doses   Cr stable, elevated  Objective:  Blood pressure 116/83, pulse 60, temperature (!) 97.5 F (36.4 C), temperature source Axillary, resp. rate 18, height '5\' 2"'$  (1.575 m), weight 74.9 kg, SpO2 98 %.    Vent Mode: Other (Comment) FiO2 (%):  [40 %] 40 % Set Rate:  [18 bmp] 18 bmp Vt Set:  [400 mL] 400 mL PEEP:  [5 cmH20] 5 cmH20 Plateau Pressure:  [9 cmH20-19 cmH20] 19 cmH20   Intake/Output Summary (Last 24 hours) at 12/28/2021 1004 Last data filed at 12/28/2021 0600 Gross per 24 hour  Intake 600 ml  Output 1050 ml  Net -450 ml   Filed Weights   12/23/21 0346 12/24/21 0434 12/25/21 0500  Weight: 78.7 kg 74.9 kg 74.9 kg   Physical Examination: General:  Critically ill comatose  HEENT: Trach. Protuberant edematous tongue  Neuro: Not following commands  CV:rr  PULM:  MV symmetriacl chest expansion  GI: soft ndnt  Extremities:  edematous BUE BLE    Resolved Hospital Issues  Hypokalemia, resolved Aspiration pneumonia VAP with sepsis -- stenotrophomonas maltophilia   Assessment & Plan:    Acute on chronic respiratory failure Trach vent dependence  P: -routine trach care -VAP pulm hygiene  -PRN CXR   SAH with brain compression OOH arrest Central DI with hypernatremia due to above  P: -poor prognosis.  -supportive care -continue ddavp  -cont FWF, incr frequency to q6   AKI Hyperkalemia  -worse renal fxn again 12/3 with Cr 2.95 -previously est that she is not a dialysis candidate  P: -[place], daily BMP  -cont FWF  -follow UOP -changing EN   Adrenal insufficiency, relative  P: -wean hydrocort to 5 BID (12/1) --consider dc 12/4  HTN  P: -continue norvasc , PRN hydral    Anemia of chronic dz and critical illness  P: -PRN CBC  GOC: Difficult family situation with some members wishing for transition to [place] and a few others hoping for recovery with time. There are efforts at facility placement. Family is trying to continue [place] talks.   Best Practice: (right click and "Reselect all SmartList Selections" daily)  Diet/type: tubefeeds and NPO w/ meds via tube  DVT prophylaxis: prophylactic heparin  GI prophylaxis: PPI Lines: N/A  Foley:  N/A Code Status: Full Code GOC: 11/29 spoke with husband at bedside regarding [place], he states that he is waiting for his son to talk to his side of the family, husband doesn't want wife to suffer and is ready to do comfort but wants to wait on son's side of family to come to a decision; palliative care following for [place] conversations  CCT: n/a  Eliseo Gum MSN, AGACNP-BC [place] for pager  12/28/2021, 10:04 AM

## 2021-12-29 DIAGNOSIS — N19 Unspecified kidney failure: Secondary | ICD-10-CM

## 2021-12-29 DIAGNOSIS — S066X9A Traumatic subarachnoid hemorrhage with loss of consciousness of unspecified duration, initial encounter: Secondary | ICD-10-CM | POA: Diagnosis not present

## 2021-12-29 LAB — CBC
HCT: 35.6 % — ABNORMAL LOW (ref 36.0–46.0)
Hemoglobin: 10.4 g/dL — ABNORMAL LOW (ref 12.0–15.0)
MCH: 31.3 pg (ref 26.0–34.0)
MCHC: 29.2 g/dL — ABNORMAL LOW (ref 30.0–36.0)
MCV: 107.2 fL — ABNORMAL HIGH (ref 80.0–100.0)
Platelets: 172 10*3/uL (ref 150–400)
RBC: 3.32 MIL/uL — ABNORMAL LOW (ref 3.87–5.11)
RDW: 20.1 % — ABNORMAL HIGH (ref 11.5–15.5)
WBC: 6.7 10*3/uL (ref 4.0–10.5)
nRBC: 0 % (ref 0.0–0.2)

## 2021-12-29 LAB — BASIC METABOLIC PANEL
Anion gap: 9 (ref 5–15)
BUN: 100 mg/dL — ABNORMAL HIGH (ref 6–20)
CO2: 24 mmol/L (ref 22–32)
Calcium: 9.1 mg/dL (ref 8.9–10.3)
Chloride: 116 mmol/L — ABNORMAL HIGH (ref 98–111)
Creatinine, Ser: 3.74 mg/dL — ABNORMAL HIGH (ref 0.44–1.00)
GFR, Estimated: 14 mL/min — ABNORMAL LOW (ref >60)
Glucose, Bld: 78 mg/dL (ref 70–99)
Potassium: 5.8 mmol/L — ABNORMAL HIGH (ref 3.5–5.1)
Sodium: 149 mmol/L — ABNORMAL HIGH (ref 135–145)

## 2021-12-29 LAB — MAGNESIUM: Magnesium: 3.9 mg/dL — ABNORMAL HIGH (ref 1.7–2.4)

## 2021-12-29 MED ORDER — SODIUM ZIRCONIUM CYCLOSILICATE 10 G PO PACK: 10.0000 g | PACK | Freq: Every day | ORAL | Status: DC

## 2021-12-29 MED ORDER — FREE WATER
200.0000 mL | Status: DC
  Administered 2021-12-29 – 2022-01-01 (×19): 200 mL

## 2021-12-29 MED ORDER — SODIUM ZIRCONIUM CYCLOSILICATE 10 G PO PACK
10.0000 g | PACK | Freq: Every day | ORAL | Status: DC
  Administered 2021-12-30 – 2021-12-31 (×2): 10 g via ORAL
  Filled 2021-12-29 (×2): qty 1

## 2021-12-29 NOTE — Progress Notes (Signed)
NAME:  [name], MRN:  [id number], DOB:  01-23-1976, LOS: 65 ADMISSION DATE:  10/20/2021, CONSULTATION DATE: 10/18/2021 REFERRING MD: EDP, CHIEF COMPLAINT: Subarachnoid hemorrhage  History of Present Illness:  46 yo female former smoker was found unresponsive by her husband and EMS called.  She was making gurgling noises with her breathing.  She was found to have SVT and EMS performed DCCV.  She had GCS 4 in ER and intubated for airway protection.  Found to have large SAH and neurosurgery arranged for hospital admission.  PCCM consulted to assist with management in ICU.  Pertinent Medical History:  Hypertension, uterine fibroids Legally blind in left eye  Significant Hospital Events: Including procedures, antibiotic start and stop dates in addition to other pertinent events   9/07 Presented unresponsive, hypotensive; CT head with subarachnoid hemorrhage 9/11 DDAVP give for Central DI, repeat head CT with worsening infarcts and herniation 9/12 Attempted transfer for second opinion at [facility] and [facility].  No beds at [facility] and transfer was declined at [facility] 9/13 No change in mental status.  Transfer to [facility] declined 9/14 [facility] declined transfer again.  No change.  DDAVP x 1 9/18 Palliative care consulted 9/23 Na increased >> restart DDAVP and increase free water 9/25 Plan for trach. Increased pressor requirement. Added midodrine  9/26 D/c D5W. Weaning NE as able. On I/O cath protocol  9/27 Worse renal fxn and new hyperkalemia  9/30 Renal function improving 10/5 Off levo 10/6 Plan for PEG tube 10/7 BPs elevated in AM, monitoring 10/17 PEG deferred in the setting of severe hypernatremia (173), DI. 10/18 No significant neurologic change. Na improved to 152, remains on DDAVP with FWF. Weaning NE as able, BP remains labile. Midodrine initiated. 10/19 No significant neurologic change. DDAVP discontinued. BP improved with addition of midodrine, weaning NE. Increased midodrine to '15mg'$   TID. 10/20 Cortrak clogged this morning, awaiting PEG placement. Na 125 (134), discontinued D5W and FWF. 10/22 Fever spiked, POD1 from PEG  11/1 Persistent anemia of chronic disease.  Patient's family agreed to transfusion today.  11/2 Hypotensive, hypothermic started on vanc/cefepime, low dose levo 11/3 Steroid dose increased, 5% 25g albumin given, off pressor 11/4 RUE picc removed, mid-line placed  11/8 Continuing to work on facility placement without success  11/10 [place] talk w husband and son 11/15 No significant change in neurologic exam. CBC stable, Na 150 (downtrending). Remainder of labs stable. 11/16 No change in neuro exam. Na 142 (150). Hydralazine PRN overnight for elevated BP. 11/17 Unchanged clinical exam. Na 147 (142). BP better controlled. Trach change today. 11/24 no change in mental status.  Family was supposed to make decision on goals of care after Thanksgiving but have deferred to next week 11/25 Started Norvasc for hypertension 11/30 lokelma for K 6.2 FWF 12/4 has had progressive renal decline despite several days of changes (incr FWF, lokelma, EN changes)   Interim History / Subjective:   Worse renal function again with Cr bump to 3.74. BUN 100 K 5.8 UOP is dropping off -- about .12m/kg/hr the last shift  Objective:  Blood pressure 118/73, pulse 77, temperature 99.3 F (37.4 C), temperature source Axillary, resp. rate 18, height '5\' 2"'$  (1.575 m), weight 75 kg, SpO2 96 %.    Vent Mode: Other (Comment) FiO2 (%):  [40 %] 40 % Set Rate:  [18 bmp] 18 bmp Vt Set:  [400 mL] 400 mL PEEP:  [5 cmH20] 5 cmH20 Plateau Pressure:  [17 cmH20-20 cmH20] 17 cmH20   Intake/Output  Summary (Last 24 hours) at 12/29/2021 1110 Last data filed at 12/29/2021 0800 Gross per 24 hour  Intake 1774.67 ml  Output 750 ml  Net 1024.67 ml   Filed Weights   12/24/21 0434 12/25/21 0500 12/29/21 0500  Weight: 74.9 kg 74.9 kg 75 kg   Physical Examination: General: Chronically critically ill  F trach/vent.  HEENT: Trach secure. Protuberant, edematous tongue wrapped in gauze  Neuro: Not sedated. No spontaneous RR on PSV trial. Fixed dilated pupils. Some reflexive movement to pain  CV: rr s1s2  PULM:  Mechanically ventilated. Clear  GI: soft ndnt PEG  GU: amber urine  Extremities: Edematous    Resolved Hospital Issues  Hypokalemia, resolved Aspiration pneumonia VAP with sepsis -- stenotrophomonas maltophilia   Assessment & Plan:    Acute on chronic respiratory failure Trach and vent dependent  P: -routine trach care -VAP pulm hygiene  -PRN CXR   SAH with brain compression OOH arrest Central DI with hypernatremia due to above P: -poor neuro prognosis -continue ddavp  -cont FWF, incr frequency to q4 -GOC as below   AKI with uremia  Hyperkalemia, persistent  -worse renal fx 12/4 despite changes 12/3 P -incr FWF again -[facility] lokelma -has already been est that she would not be a candidate for dialysis. Need to have [place] talks w family again about current state and trajectory. If we were to continue aggressive care, think next step for renal issues would be lasix challenge  HTN  P: -Norvasc, PRN hydral   Anemia of chronic disease and critical illness  P: -PRN CBC  Relative adrenal insufficiency -dc hydrocort 12/4   GOC: Difficult family situation with some members wishing for transition to [place] and a few others hoping for recovery with time. There are efforts at facility placement. Family is trying to continue [place] talks.   Think that with worsening renal failure, it is also possible the pt is terminally declining beyond severe intracranial process   Best Practice: (right click and "Reselect all SmartList Selections" daily)  Diet/type: tubefeeds and NPO w/ meds via tube  DVT prophylaxis: prophylactic heparin  GI prophylaxis: PPI Lines: N/A  Foley:  N/A Code Status: Full Code GOC: 12/4 see IPAL   CCT: n/a  Eliseo Gum MSN, AGACNP-BC [place] for pager  12/29/2021, 11:10 AM

## 2021-12-29 NOTE — IPAL (Addendum)
   Interdisciplinary Goals of Care Family Meeting   Date carried out: 12/29/2021  Location of the meeting: Bedside  Member's involved: Physician, Nurse Practitioner, and Family Member or next of kin  Durable Power of Attorney or acting medical decision maker: husband    Discussion: We discussed goals of care for [employer] .  Discussed with husband pts worsening renal failure despite efforts over the last week to tx this. Worry this is her body shutting down terminally. He has been accepting of her overall prognosis, and for some time has favored transiiton to [place]-- difficult family dynamics are limitation. He has asked that I speak w son when he arrives (feels that conversations between them about [place] go better when son hears from medical team first). There is a complex dynamic with blended family, pt son has been largely the communicator to one side of the family which encompasses the couple of family members who are having a difficult time accepting pt neuro prognosis.  Discussed recommendation for DNR as well. Recommended that after I talk w son we try to find a time where we can all discuss together.   Will d/w pt son when he arrives   Code status: Full Code  Disposition: Continue current acute care  Time spent for the meeting: 15 min   Addendum: met with son at bedside. Relayed the above information, concerns and recommendations. He shares that he is going to speak with family and recommend compassionate withdrawal of care 12/7 (7 being a significant number to the patient).  Continue current interventions, full code, at present.    Additional meeting time 12 minutes  Cristal Generous, NP  12/29/2021, 12:07 PM

## 2021-12-29 NOTE — Progress Notes (Signed)
Patient ID: [name], female   DOB: Jun 05, 1975, 46 y.o.   MRN: [id number] BP 106/73   Pulse 65   Temp 97.6 F (36.4 C) (Axillary)   Resp 18   Ht '5\' 2"'$  (1.575 m)   Wt 75 kg   SpO2 100%   BMI 30.24 kg/m  Comatose Renal function is deteriorating. Family in discussion.  Prognosis grim

## 2021-12-29 NOTE — TOC Progression Note (Signed)
Transition of Care [facility]) - Progression Note    Patient Details  Name: [name] MRN: [id number] Date of Birth: 10-Dec-1975  Transition of [facility]) CM/SW [place], LCSW Phone Number: 12/29/2021, 12:15 PM  Clinical Narrative:    CSW spoke with patient's spouse. He reported that per the MD, patient is not doing well and there may not be a reason to send patient to SNF. He reported the MD is going to talk with patient's son to make him aware so they can make a decision.    Expected Discharge Plan: Skilled Nursing Facility Barriers to Discharge: Ship broker, SNF Pending bed offer  Expected Discharge Plan and Services Expected Discharge Plan: [place] In-house Referral: Clinical Social Work, Hospice / Palliative Care Discharge Planning Services: CM Consult Post Acute Care Choice: [place] Living arrangements for the past 2 months: Single Family Home                                       Social Determinants of Health (SDOH) Interventions    Readmission Risk Interventions     No data to display

## 2021-12-30 DIAGNOSIS — S066X9A Traumatic subarachnoid hemorrhage with loss of consciousness of unspecified duration, initial encounter: Secondary | ICD-10-CM | POA: Diagnosis not present

## 2021-12-30 NOTE — Progress Notes (Signed)
Upon assessment noted abdominal/bladder distention. Patient usually urinates some in purewick every time she is stimulated. This time she urinated 61m so performed a bladder scan which revealed 5033mof urine post void residual.  In and out cath performed with [id number]. Abdomen softer post catheterization.

## 2021-12-30 NOTE — Progress Notes (Signed)
Patient ID: [name], female   DOB: 01/16/1976, 46 y.o.   MRN: [id number] BP 105/71 (BP Location: Right Arm)   Pulse 65   Temp 98.1 F (36.7 C) (Axillary)   Resp 17   Ht '5\' 2"'$  (1.575 m)   Wt 75 kg   SpO2 95%   BMI 30.24 kg/m  Comatose, no corneal reflex, no oculocephalic reflex. No change in exam. Care will be withdrawn on 12/7 per family.

## 2021-12-30 NOTE — Progress Notes (Signed)
NAME:  [name], MRN:  [id number], DOB:  1975/08/21, LOS: 105 ADMISSION DATE:  10/11/2021, CONSULTATION DATE: 10/07/2021 REFERRING MD: EDP, CHIEF COMPLAINT: Subarachnoid hemorrhage  History of Present Illness:  46 yo female former smoker was found unresponsive by her husband and EMS called.  She was making gurgling noises with her breathing.  She was found to have SVT and EMS performed DCCV.  She had GCS 4 in ER and intubated for airway protection.  Found to have large SAH and neurosurgery arranged for hospital admission.  PCCM consulted to assist with management in ICU.  Pertinent Medical History:  Hypertension, uterine fibroids Legally blind in left eye  Significant Hospital Events: Including procedures, antibiotic start and stop dates in addition to other pertinent events   9/07 Presented unresponsive, hypotensive; CT head with subarachnoid hemorrhage 9/11 DDAVP give for Central DI, repeat head CT with worsening infarcts and herniation 9/12 Attempted transfer for second opinion at [facility] and [facility].  No beds at [facility] and transfer was declined at [facility] 9/13 No change in mental status.  Transfer to [facility] declined 9/14 [facility] declined transfer again.  No change.  DDAVP x 1 9/18 Palliative care consulted 9/23 Na increased >> restart DDAVP and increase free water 9/25 Plan for trach. Increased pressor requirement. Added midodrine  9/26 D/c D5W. Weaning NE as able. On I/O cath protocol  9/27 Worse renal fxn and new hyperkalemia  9/30 Renal function improving 10/5 Off levo 10/6 Plan for PEG tube 10/7 BPs elevated in AM, monitoring 10/17 PEG deferred in the setting of severe hypernatremia (173), DI. 10/18 No significant neurologic change. Na improved to 152, remains on DDAVP with FWF. Weaning NE as able, BP remains labile. Midodrine initiated. 10/19 No significant neurologic change. DDAVP discontinued. BP improved with addition of midodrine, weaning NE. Increased midodrine to '15mg'$   TID. 10/20 Cortrak clogged this morning, awaiting PEG placement. Na 125 (134), discontinued D5W and FWF. 10/22 Fever spiked, POD1 from PEG  11/1 Persistent anemia of chronic disease.  Patient's family agreed to transfusion today.  11/2 Hypotensive, hypothermic started on vanc/cefepime, low dose levo 11/3 Steroid dose increased, 5% 25g albumin given, off pressor 11/4 RUE picc removed, mid-line placed  11/8 Continuing to work on facility placement without success  11/10 [place] talk w husband and son 11/15 No significant change in neurologic exam. CBC stable, Na 150 (downtrending). Remainder of labs stable. 11/16 No change in neuro exam. Na 142 (150). Hydralazine PRN overnight for elevated BP. 11/17 Unchanged clinical exam. Na 147 (142). BP better controlled. Trach change today. 11/24 no change in mental status.  Family was supposed to make decision on goals of care after Thanksgiving but have deferred to next week 11/25 Started Norvasc for hypertension 11/30 lokelma for K 6.2 FWF 12/4 has had progressive renal decline despite several days of changes (incr FWF, lokelma, EN changes)   Interim History / Subjective:   Needed I/O overnight    Objective:  Blood pressure 113/79, pulse 66, temperature (!) 97.3 F (36.3 C), temperature source Axillary, resp. rate 18, height '5\' 2"'$  (1.575 m), weight 75 kg, SpO2 99 %.    Vent Mode: Other (Comment) FiO2 (%):  [40 %] 40 % Set Rate:  [18 bmp] 18 bmp Vt Set:  [400 mL] 400 mL PEEP:  [5 cmH20] 5 cmH20 Plateau Pressure:  [17 cmH20-18 cmH20] 17 cmH20   Intake/Output Summary (Last 24 hours) at 12/30/2021 0933 Last data filed at 12/30/2021 0700 Gross per 24 hour  Intake 1920 ml  Output 1975 ml  Net -55 ml   Filed Weights   12/25/21 0500 12/29/21 0500 12/30/21 0500  Weight: 74.9 kg 75 kg 75 kg   Physical Examination: General: Chronically critically ill trach/vent  HEENT: Tongue wrapped in gauze. Trach secure  Neuro: Pupils are fixed and dilated no  spontaneous respiration on PSV CV: rr s1s2 no rgm  PULM:  ctab on MV GI: PEG  GU: purewick  Extremities: edematous extremities    Resolved Hospital Issues  Hypokalemia, resolved Aspiration pneumonia VAP with sepsis -- stenotrophomonas maltophilia Septic shock Adrenal insufficiency, relative   Assessment & Plan:   Goals of care P -Discussed with husband and son 12/4. -Decision for compassionate liberation from current supports and transition to comfort care 12/7. Confirmed this with husband 12/5 morning, we discussed in detail what this process will entail and look like. He is very supportive of utilization of analgesic and anxiolytic agents. He  indicated to d/w son re time on the 7th. Will do so when he arrives  -Will also gently inquire about changing code status to DNR now vs waiting for comfort transition -Will also suggest de-escalation of lab draws, etc in interim    SAH with brain compression OOH cardiac arrest Central DI with hypernatremia due to above Acute on (now chronic) respiratory failure Trach and vent dependence   AKI with uremia Hyperkalemia Hypernatremia  Urinary retention HTN Anemia of chronic dz and critical illness  P: -poor neuro prognosis -continue ddavp  -cont FWF [facility] lokelma  -routine trach care, pulm hygiene -norvasc, PRN hydral  -insert foley     Best Practice: (right click and "Reselect all SmartList Selections" daily)  Diet/type: tubefeeds and NPO w/ meds via tube  DVT prophylaxis: prophylactic heparin  GI prophylaxis: PPI Lines: N/A  Foley:  N/A Code Status: Full Code GOC: 12/4 see IPAL -- tentative transition to comfort care 12/7  CCT: [place] MSN, AGACNP-BC [place] for pager  12/30/2021, 9:33 AM

## 2021-12-31 DIAGNOSIS — S066X9A Traumatic subarachnoid hemorrhage with loss of consciousness of unspecified duration, initial encounter: Secondary | ICD-10-CM | POA: Diagnosis not present

## 2021-12-31 NOTE — Progress Notes (Addendum)
NAME:  [name], MRN:  [id number], DOB:  [birth year], LOS: 75 ADMISSION DATE:  10/14/2021, CONSULTATION DATE: 10/05/2021 REFERRING MD: EDP, CHIEF COMPLAINT: Subarachnoid hemorrhage  History of Present Illness:  46 yo female former smoker was found unresponsive by her husband and EMS called.  She was making gurgling noises with her breathing.  She was found to have SVT and EMS performed DCCV.  She had GCS 4 in ER and intubated for airway protection.  Found to have large SAH and neurosurgery arranged for hospital admission.  PCCM consulted to assist with management in ICU.  Pertinent Medical History:  Hypertension, uterine fibroids Legally blind in left eye  Significant Hospital Events: Including procedures, antibiotic start and stop dates in addition to other pertinent events   9/07 Presented unresponsive, hypotensive; CT head with subarachnoid hemorrhage 9/11 DDAVP give for Central DI, repeat head CT with worsening infarcts and herniation 9/12 Attempted transfer for second opinion at [facility] and [facility].  No beds at [facility] and transfer was declined at [facility] 9/13 No change in mental status.  Transfer to [facility] declined 9/14 [facility] declined transfer again.  No change.  DDAVP x 1 9/18 Palliative care consulted 9/23 Na increased >> restart DDAVP and increase free water 9/25 Plan for trach. Increased pressor requirement. Added midodrine  9/26 D/c D5W. Weaning NE as able. On I/O cath protocol  9/27 Worse renal fxn and new hyperkalemia  9/30 Renal function improving 10/5 Off levo 10/6 Plan for PEG tube 10/7 BPs elevated in AM, monitoring 10/17 PEG deferred in the setting of severe hypernatremia (173), DI. 10/18 No significant neurologic change. Na improved to 152, remains on DDAVP with FWF. Weaning NE as able, BP remains labile. Midodrine initiated. 10/19 No significant neurologic change. DDAVP discontinued. BP improved with addition of midodrine, weaning NE. Increased midodrine to '15mg'$   TID. 10/20 Cortrak clogged this morning, awaiting PEG placement. Na 125 (134), discontinued D5W and FWF. 10/22 Fever spiked, POD1 from PEG  11/1 Persistent anemia of chronic disease.  Patient's family agreed to transfusion today.  11/2 Hypotensive, hypothermic started on vanc/cefepime, low dose levo 11/3 Steroid dose increased, 5% 25g albumin given, off pressor 11/4 RUE picc removed, mid-line placed  11/8 Continuing to work on facility placement without success  11/10 [place] talk w husband and son 11/15 No significant change in neurologic exam. CBC stable, Na 150 (downtrending). Remainder of labs stable. 11/16 No change in neuro exam. Na 142 (150). Hydralazine PRN overnight for elevated BP. 11/17 Unchanged clinical exam. Na 147 (142). BP better controlled. Trach change today. 11/24 no change in mental status.  Family was supposed to make decision on goals of care after Thanksgiving but have deferred to next week 11/25 Started Norvasc for hypertension 11/30 lokelma for K 6.2 FWF 12/4 has had progressive renal decline despite several days of changes (incr FWF, lokelma, EN changes)   Interim History / Subjective:   No changes     Objective:  Blood pressure 90/61, pulse (Abnormal) 57, temperature (Abnormal) 97.2 F (36.2 C), temperature source Axillary, resp. rate 18, height '5\' 2"'$  (1.575 m), weight 75 kg, SpO2 95 %.    Vent Mode: Other (Comment) FiO2 (%):  [40 %] 40 % Set Rate:  [18 bmp] 18 bmp Vt Set:  [400 mL] 400 mL PEEP:  [5 cmH20] 5 cmH20 Plateau Pressure:  [17 cmH20-18 cmH20] 17 cmH20   Intake/Output Summary (Last 24 hours) at 12/31/2021 0720 Last data filed at 12/31/2021 0654 Gross per 24  hour  Intake 2120 ml  Output 2485 ml  Net -365 ml   Filed Weights   12/25/21 0500 12/29/21 0500 12/30/21 0500  Weight: 74.9 kg 75 kg 75 kg   Physical Examination:  General comatose 46 year old female. Remains unresponsive HENT tongue swollen. Lurline Idol is midline Pulm coarse rhonchi Card  rrr Abd soft Ext warm dependent edema  Neuro comatose.pupils not reactive. Still has reflex response to noxious stim.   Resolved Hospital Issues  Hypokalemia, resolved Aspiration pneumonia VAP with sepsis -- stenotrophomonas maltophilia Septic shock Adrenal insufficiency, relative   Assessment & Plan:     SAH with brain compression OOH cardiac arrest Central DI with hypernatremia due to above Acute on (now chronic) respiratory failure Trach and vent dependence   AKI with uremia Hyperkalemia Hypernatremia  Urinary retention HTN Anemia of chronic dz and critical illness   Discussion  -poor neuro prognosis Long d/w family over the course of the week. Now planning on wd on 7th  Plan Cont current rx Transition to comfort planned for 12/7     Best Practice: (right click and "Reselect all SmartList Selections" daily)  Diet/type: tubefeeds and NPO w/ meds via tube  DVT prophylaxis: prophylactic heparin  GI prophylaxis: PPI Lines: N/A  Foley:  N/A Code Status: Full Code GOC: 12/4 see IPAL -- tentative transition to comfort care 12/7  CCT: [place] ACNP-BC [place] Pager # [phone] OR # [phone] if no answer

## 2021-12-31 NOTE — Progress Notes (Signed)
Nutrition Brief Note  Chart reviewed. Transition to comfort care planned for January 03, 2022. No further nutrition interventions planned at this time.  Please re-consult as needed.   Gustavus Bryant, MS, RD, LDN Inpatient Clinical Dietitian Please see AMiON for contact information.

## 2021-12-31 NOTE — Progress Notes (Signed)
Patient ID: [name], female   DOB: May 11, 1975, 46 y.o.   MRN: [id number] BP (!) 89/58   Pulse (!) 59   Temp (!) 97.1 F (36.2 C) (Axillary)   Resp 18   Ht '5\' 2"'$  (1.575 m)   Wt 75 kg   SpO2 98%   BMI 30.24 kg/m  Care being withdrawn tomorrow. No change in neurological status.

## 2022-01-01 MED ORDER — SODIUM CHLORIDE 0.9 % IV SOLN: INTRAVENOUS | Status: DC

## 2022-01-01 MED ORDER — GLYCOPYRROLATE 0.2 MG/ML IJ SOLN: 0.2000 mg | INTRAMUSCULAR | Status: DC | PRN

## 2022-01-01 MED ORDER — MORPHINE SULFATE (PF) 2 MG/ML IV SOLN
2.0000 mg | INTRAVENOUS | Status: DC | PRN
  Administered 2022-01-01: 4 mg via INTRAVENOUS
  Filled 2022-01-01: qty 2

## 2022-01-01 MED ORDER — GLYCOPYRROLATE 1 MG PO TABS: 1.0000 mg | ORAL_TABLET | ORAL | Status: DC | PRN

## 2022-01-01 MED ORDER — ACETAMINOPHEN 650 MG RE SUPP: 650.0000 mg | Freq: Four times a day (QID) | RECTAL | Status: DC | PRN

## 2022-01-01 MED ORDER — POLYVINYL ALCOHOL 1.4 % OP SOLN: 1.0000 [drp] | Freq: Four times a day (QID) | OPHTHALMIC | Status: DC | PRN

## 2022-01-01 MED ORDER — ACETAMINOPHEN 325 MG PO TABS: 650.0000 mg | ORAL_TABLET | Freq: Four times a day (QID) | ORAL | Status: DC | PRN

## 2022-01-26 NOTE — Progress Notes (Signed)
Spoke with patient's husband and daughter.  RN asked if they were planning on going comfort care with the intentions of taking the patient off of the ventilator.  I was told that this was indeed the intended plan but that they family needed some more time.  Will continue to assess and act accordingly.

## 2022-01-26 NOTE — Progress Notes (Signed)
Pt noted to be in asystole 8:05 pm, and apneic since withdrawal of mechanical ventilation as noted by RT at 7:42 pm. 2 RN, this nurse and RN A. Marsa Aris, verified with auscultation pt to be pulseless and declared death.

## 2022-01-26 NOTE — Progress Notes (Signed)
Patient taken off of the vent for comfort care measures.  RN and family at bedside.

## 2022-01-26 NOTE — Discharge Summary (Signed)
Physician Discharge Summary  Patient ID: [name] MRN: [id number] DOB/AGE: November 10, 1975 47 y.o.  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[place])   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   AKI (acute kidney injury) ([place])   Hyperkalemia   Altered mental status   Chronic respiratory failure with hypoxia (HCC)   Palliative care by specialist   Goals of care, counseling/discussion   Ventilator dependence ([place])   Hypernatremia   Tracheostomy status ([place])   DI (diabetes insipidus) ([place])   Acute respiratory failure ([place])   Tracheostomy in place [facility])   Acute on chronic respiratory failure with hypoxia ([place])   Uremia   Discharged Condition: deceased  Hospital Course: [name] was admitted after a subarachnoid hemorrhage. Early exams revealed lack of brainstem function due to cerebral herniation. Her condition did not change whatsoever during her hospitalization. Family refused brain death testing. She received a tracheostomy, feeding tube, and multiple medications during her stay. Family eventually decided to end active treatments. She expired after ventilator removed.   Consults: pulmonary/intensive care, GI, and nephrology  Significant Diagnostic Studies: numerous  Treatments: IV hydration, antibiotics:, analgesia: acetaminophen, Morphine, and other meds, cardiac meds: pressors, TPN, respiratory therapy: O2, procedures: arterial line, and surgery: tracheostomy, feeding tube placement  Discharge Exam: Blood pressure 98/70, pulse 66, temperature 98.6 F (37 C), temperature source Axillary, resp. rate 18, height '5\' 2"'$  (1.575 m), weight 75 kg, SpO2 (!) 73  %. deceased  Disposition:    Allergies as of 2022/01/02       Reactions   Penicillins Hives   Tolerated Unasyn during 09/2021 admission        Medication List     STOP taking these medications    albuterol 108 (90 Base) MCG/ACT inhaler Commonly known as: VENTOLIN HFA   atorvastatin 20 MG tablet Commonly known as: LIPITOR   B-12 2500 MCG Tabs   dicyclomine 20 MG tablet Commonly known as: BENTYL   ibuprofen 200 MG tablet Commonly known as: ADVIL   ibuprofen 800 MG tablet Commonly known as: ADVIL   loperamide 2 MG capsule Commonly known as: IMODIUM   megestrol 40 MG tablet Commonly known as: MEGACE   ondansetron 4 MG disintegrating tablet Commonly known as: Zofran ODT   [place] 0.2 % Soln Generic drug: Olopatadine HCl   valsartan-hydrochlorothiazide 160-25 MG tablet Commonly known as: DIOVAN-HCT   VITAMIN D PO   VITAMIN E PO         Signed: Ashok Pall Jan 02, 2022, 8:08 PM

## 2022-01-26 NOTE — Progress Notes (Signed)
NAME:  [name], MRN:  [id number], DOB:  1975/04/09, LOS: 8 ADMISSION DATE:  10/03/2021, CONSULTATION DATE: 09/26/2021 REFERRING MD: EDP, CHIEF COMPLAINT: Subarachnoid hemorrhage  History of Present Illness:  46 yo female former smoker was found unresponsive by her husband and EMS called.  She was making gurgling noises with her breathing.  She was found to have SVT and EMS performed DCCV.  She had GCS 4 in ER and intubated for airway protection.  Found to have large SAH and neurosurgery arranged for hospital admission.  PCCM consulted to assist with management in ICU.  Pertinent Medical History:  Hypertension, uterine fibroids Legally blind in left eye  Significant Hospital Events: Including procedures, antibiotic start and stop dates in addition to other pertinent events   9/07 Presented unresponsive, hypotensive; CT head with subarachnoid hemorrhage 9/11 DDAVP give for Central DI, repeat head CT with worsening infarcts and herniation 9/12 Attempted transfer for second opinion at [facility] and [facility].  No beds at [facility] and transfer was declined at [facility] 9/13 No change in mental status.  Transfer to [facility] declined 9/14 [facility] declined transfer again.  No change.  DDAVP x 1 9/18 Palliative care consulted 9/23 Na increased >> restart DDAVP and increase free water 9/25 Plan for trach. Increased pressor requirement. Added midodrine  9/26 D/c D5W. Weaning NE as able. On I/O cath protocol  9/27 Worse renal fxn and new hyperkalemia  9/30 Renal function improving 10/5 Off levo 10/6 Plan for PEG tube 10/7 BPs elevated in AM, monitoring 10/17 PEG deferred in the setting of severe hypernatremia (173), DI. 10/18 No significant neurologic change. Na improved to 152, remains on DDAVP with FWF. Weaning NE as able, BP remains labile. Midodrine initiated. 10/19 No significant neurologic change. DDAVP discontinued. BP improved with addition of midodrine, weaning NE. Increased midodrine to '15mg'$   TID. 10/20 Cortrak clogged this morning, awaiting PEG placement. Na 125 (134), discontinued D5W and FWF. 10/22 Fever spiked, POD1 from PEG  11/1 Persistent anemia of chronic disease.  Patient's family agreed to transfusion today.  11/2 Hypotensive, hypothermic started on vanc/cefepime, low dose levo 11/3 Steroid dose increased, 5% 25g albumin given, off pressor 11/4 RUE picc removed, mid-line placed  11/8 Continuing to work on facility placement without success  11/10 [place] talk w husband and son 11/15 No significant change in neurologic exam. CBC stable, Na 150 (downtrending). Remainder of labs stable. 11/16 No change in neuro exam. Na 142 (150). Hydralazine PRN overnight for elevated BP. 11/17 Unchanged clinical exam. Na 147 (142). BP better controlled. Trach change today. 11/24 no change in mental status.  Family was supposed to make decision on goals of care after Thanksgiving but have deferred to next week 11/25 Started Norvasc for hypertension 11/30 lokelma for K 6.2 FWF 12/4 has had progressive renal decline despite several days of changes (incr FWF, lokelma, EN changes) 12/5 family deciding to transition to comfort (tentatively 12/7)    Interim History / Subjective:   No change    Objective:  Blood pressure (Abnormal) 88/59, pulse 60, temperature 97.8 F (36.6 C), temperature source Axillary, resp. rate 18, height '5\' 2"'$  (1.575 m), weight 75 kg, SpO2 95 %.    Vent Mode: AC FiO2 (%):  [40 %] 40 % Set Rate:  [18 bmp] 18 bmp Vt Set:  [400 mL] 400 mL PEEP:  [5 cmH20] 5 cmH20 Plateau Pressure:  [16 cmH20-20 cmH20] 19 cmH20   Intake/Output Summary (Last 24 hours) at 01/07/2022 0701 Last data  filed at 01-24-22 0500 Gross per 24 hour  Intake 2120 ml  Output 2125 ml  Net -5 ml   Filed Weights   12/25/21 0500 12/29/21 0500 12/30/21 0500  Weight: 74.9 kg 75 kg 75 kg   Physical Examination: General 47 year old female who remains unresponsive and comatose on vent  HENT trach  midline. Tongue swollen Pulm scattered rhonchi Card rrr Abd soft Ext warm dependent edema Neuro still has brain-stem reflexive activity   Resolved Hospital Issues  Hypokalemia, resolved Aspiration pneumonia VAP with sepsis -- stenotrophomonas maltophilia Septic shock Adrenal insufficiency, relative   Assessment & Plan:     SAH with brain compression OOH cardiac arrest Central DI with hypernatremia due to above Acute on (now chronic) respiratory failure Trach and vent dependence   AKI with uremia Hyperkalemia Hypernatremia  Urinary retention HTN Anemia of chronic dz and critical illness   Discussion  -poor neuro prognosis Long d/w family over the course of the week. Now planning on wd on 7th  Plan Cont current vent support Limit labs VAP bundle  Remove vent when family ready      Best Practice: (right click and "Reselect all SmartList Selections" daily)  Diet/type: tubefeeds and NPO w/ meds via tube  DVT prophylaxis: prophylactic heparin  GI prophylaxis: PPI Lines: N/A  Foley:  N/A Code Status: Full Code GOC: 12/4 see IPAL -- tentative transition to comfort care 12/7  CCT: [place] ACNP-BC [place] Pager # [phone] OR # [phone] if no answer

## 2022-01-26 DEATH — deceased
